# Patient Record
Sex: Female | Born: 1935 | Race: White | Hispanic: No | Smoking: Never smoker
Health system: Southern US, Community
[De-identification: ages and names within clinical notes are randomized; demographics above are authoritative.]

## PROBLEM LIST (undated history)

## (undated) DIAGNOSIS — E119 Type 2 diabetes mellitus without complications: Secondary | ICD-10-CM

## (undated) DIAGNOSIS — K219 Gastro-esophageal reflux disease without esophagitis: Secondary | ICD-10-CM

## (undated) DIAGNOSIS — M51379 Other intervertebral disc degeneration, lumbosacral region without mention of lumbar back pain or lower extremity pain: Secondary | ICD-10-CM

## (undated) DIAGNOSIS — H9190 Unspecified hearing loss, unspecified ear: Secondary | ICD-10-CM

## (undated) DIAGNOSIS — R112 Nausea with vomiting, unspecified: Secondary | ICD-10-CM

## (undated) DIAGNOSIS — Z9889 Other specified postprocedural states: Secondary | ICD-10-CM

## (undated) DIAGNOSIS — I10 Essential (primary) hypertension: Secondary | ICD-10-CM

## (undated) DIAGNOSIS — S52509A Unspecified fracture of the lower end of unspecified radius, initial encounter for closed fracture: Secondary | ICD-10-CM

## (undated) DIAGNOSIS — M1990 Unspecified osteoarthritis, unspecified site: Secondary | ICD-10-CM

## (undated) DIAGNOSIS — M5137 Other intervertebral disc degeneration, lumbosacral region: Secondary | ICD-10-CM

## (undated) DIAGNOSIS — H409 Unspecified glaucoma: Secondary | ICD-10-CM

## (undated) DIAGNOSIS — I509 Heart failure, unspecified: Secondary | ICD-10-CM

## (undated) HISTORY — PX: ABDOMINAL HYSTERECTOMY: SHX81

## (undated) HISTORY — PX: BREAST EXCISIONAL BIOPSY: SUR124

## (undated) HISTORY — PX: BREAST SURGERY: SHX581

## (undated) HISTORY — PX: BREAST BIOPSY: SHX20

## (undated) HISTORY — PX: HIP FRACTURE SURGERY: SHX118

## (undated) HISTORY — PX: FRACTURE SURGERY: SHX138

## (undated) HISTORY — PX: EYE SURGERY: SHX253

---

## 1999-03-29 ENCOUNTER — Ambulatory Visit (HOSPITAL_COMMUNITY): Admission: RE | Admit: 1999-03-29 | Discharge: 1999-03-29 | Payer: Self-pay

## 1999-04-07 ENCOUNTER — Other Ambulatory Visit: Admission: RE | Admit: 1999-04-07 | Discharge: 1999-04-07 | Payer: Self-pay | Admitting: Family Medicine

## 2001-05-07 ENCOUNTER — Encounter: Payer: Self-pay | Admitting: Family Medicine

## 2001-05-07 ENCOUNTER — Ambulatory Visit (HOSPITAL_COMMUNITY): Admission: RE | Admit: 2001-05-07 | Discharge: 2001-05-07 | Payer: Self-pay | Admitting: Family Medicine

## 2001-05-16 ENCOUNTER — Ambulatory Visit (HOSPITAL_COMMUNITY): Admission: RE | Admit: 2001-05-16 | Discharge: 2001-05-16 | Payer: Self-pay | Admitting: Family Medicine

## 2001-05-16 ENCOUNTER — Encounter: Payer: Self-pay | Admitting: Family Medicine

## 2001-10-26 ENCOUNTER — Encounter: Payer: Self-pay | Admitting: Emergency Medicine

## 2001-10-26 ENCOUNTER — Emergency Department (HOSPITAL_COMMUNITY): Admission: EM | Admit: 2001-10-26 | Discharge: 2001-10-26 | Payer: Self-pay | Admitting: Emergency Medicine

## 2002-04-28 ENCOUNTER — Ambulatory Visit (HOSPITAL_COMMUNITY): Admission: RE | Admit: 2002-04-28 | Discharge: 2002-04-28 | Payer: Self-pay | Admitting: Family Medicine

## 2002-04-28 ENCOUNTER — Encounter: Payer: Self-pay | Admitting: Family Medicine

## 2003-04-30 ENCOUNTER — Emergency Department (HOSPITAL_COMMUNITY): Admission: EM | Admit: 2003-04-30 | Discharge: 2003-04-30 | Payer: Self-pay | Admitting: Emergency Medicine

## 2003-05-12 ENCOUNTER — Ambulatory Visit (HOSPITAL_COMMUNITY): Admission: RE | Admit: 2003-05-12 | Discharge: 2003-05-12 | Payer: Self-pay | Admitting: Family Medicine

## 2003-07-27 ENCOUNTER — Ambulatory Visit (HOSPITAL_COMMUNITY): Admission: RE | Admit: 2003-07-27 | Discharge: 2003-07-27 | Payer: Self-pay | Admitting: Orthopaedic Surgery

## 2003-11-13 ENCOUNTER — Ambulatory Visit (HOSPITAL_COMMUNITY): Admission: RE | Admit: 2003-11-13 | Discharge: 2003-11-13 | Payer: Self-pay | Admitting: Family Medicine

## 2004-05-26 ENCOUNTER — Ambulatory Visit (HOSPITAL_COMMUNITY): Admission: RE | Admit: 2004-05-26 | Discharge: 2004-05-26 | Payer: Self-pay | Admitting: Family Medicine

## 2005-06-02 ENCOUNTER — Ambulatory Visit (HOSPITAL_COMMUNITY): Admission: RE | Admit: 2005-06-02 | Discharge: 2005-06-02 | Payer: Self-pay | Admitting: Family Medicine

## 2005-06-24 ENCOUNTER — Emergency Department (HOSPITAL_COMMUNITY): Admission: EM | Admit: 2005-06-24 | Discharge: 2005-06-24 | Payer: Self-pay | Admitting: Emergency Medicine

## 2005-06-29 ENCOUNTER — Emergency Department (HOSPITAL_COMMUNITY): Admission: EM | Admit: 2005-06-29 | Discharge: 2005-06-29 | Payer: Self-pay | Admitting: Emergency Medicine

## 2006-09-03 ENCOUNTER — Ambulatory Visit (HOSPITAL_COMMUNITY): Admission: RE | Admit: 2006-09-03 | Discharge: 2006-09-03 | Payer: Self-pay | Admitting: Family Medicine

## 2007-07-23 ENCOUNTER — Ambulatory Visit (HOSPITAL_COMMUNITY): Admission: RE | Admit: 2007-07-23 | Discharge: 2007-07-23 | Payer: Self-pay | Admitting: Family Medicine

## 2007-09-19 ENCOUNTER — Ambulatory Visit (HOSPITAL_COMMUNITY): Admission: RE | Admit: 2007-09-19 | Discharge: 2007-09-19 | Payer: Self-pay | Admitting: Family Medicine

## 2012-02-16 ENCOUNTER — Other Ambulatory Visit (HOSPITAL_COMMUNITY): Payer: Self-pay | Admitting: Family Medicine

## 2012-02-16 DIAGNOSIS — Z139 Encounter for screening, unspecified: Secondary | ICD-10-CM

## 2012-02-22 ENCOUNTER — Ambulatory Visit (HOSPITAL_COMMUNITY): Payer: Self-pay

## 2012-02-23 ENCOUNTER — Ambulatory Visit (HOSPITAL_COMMUNITY)
Admission: RE | Admit: 2012-02-23 | Discharge: 2012-02-23 | Disposition: A | Discharge status: Home / Self Care | Payer: Medicare Other | Source: Ambulatory Visit | Attending: Family Medicine | Admitting: Family Medicine

## 2012-02-23 DIAGNOSIS — Z139 Encounter for screening, unspecified: Secondary | ICD-10-CM

## 2012-02-23 DIAGNOSIS — Z1231 Encounter for screening mammogram for malignant neoplasm of breast: Secondary | ICD-10-CM | POA: Insufficient documentation

## 2012-02-28 ENCOUNTER — Other Ambulatory Visit: Payer: Self-pay | Admitting: Family Medicine

## 2012-02-28 DIAGNOSIS — R928 Other abnormal and inconclusive findings on diagnostic imaging of breast: Secondary | ICD-10-CM

## 2012-03-13 ENCOUNTER — Ambulatory Visit (HOSPITAL_COMMUNITY)
Admission: RE | Admit: 2012-03-13 | Discharge: 2012-03-13 | Disposition: A | Discharge status: Home / Self Care | Payer: Medicare Other | Source: Ambulatory Visit | Attending: Family Medicine | Admitting: Family Medicine

## 2012-03-13 ENCOUNTER — Other Ambulatory Visit (HOSPITAL_COMMUNITY): Payer: Self-pay | Admitting: Family Medicine

## 2012-03-13 ENCOUNTER — Encounter (HOSPITAL_COMMUNITY): Payer: Medicare Other

## 2012-03-13 DIAGNOSIS — R928 Other abnormal and inconclusive findings on diagnostic imaging of breast: Secondary | ICD-10-CM

## 2014-02-17 ENCOUNTER — Other Ambulatory Visit (HOSPITAL_COMMUNITY): Payer: Self-pay | Admitting: Family Medicine

## 2014-02-17 DIAGNOSIS — Z1231 Encounter for screening mammogram for malignant neoplasm of breast: Secondary | ICD-10-CM

## 2014-02-17 DIAGNOSIS — Z139 Encounter for screening, unspecified: Secondary | ICD-10-CM

## 2014-02-25 ENCOUNTER — Ambulatory Visit (HOSPITAL_COMMUNITY)
Admission: RE | Admit: 2014-02-25 | Discharge: 2014-02-25 | Disposition: A | Discharge status: Home / Self Care | Payer: Medicare Other | Source: Ambulatory Visit | Attending: Family Medicine | Admitting: Family Medicine

## 2014-02-25 DIAGNOSIS — Z1231 Encounter for screening mammogram for malignant neoplasm of breast: Secondary | ICD-10-CM | POA: Insufficient documentation

## 2014-02-25 DIAGNOSIS — Z139 Encounter for screening, unspecified: Secondary | ICD-10-CM

## 2014-07-16 DIAGNOSIS — M810 Age-related osteoporosis without current pathological fracture: Secondary | ICD-10-CM | POA: Insufficient documentation

## 2014-07-16 DIAGNOSIS — M8949 Other hypertrophic osteoarthropathy, multiple sites: Secondary | ICD-10-CM | POA: Insufficient documentation

## 2014-07-16 DIAGNOSIS — M159 Polyosteoarthritis, unspecified: Secondary | ICD-10-CM | POA: Insufficient documentation

## 2015-05-19 DIAGNOSIS — S52539A Colles' fracture of unspecified radius, initial encounter for closed fracture: Secondary | ICD-10-CM | POA: Insufficient documentation

## 2015-05-21 ENCOUNTER — Other Ambulatory Visit: Payer: Self-pay | Admitting: Orthopedic Surgery

## 2015-05-21 ENCOUNTER — Encounter (HOSPITAL_BASED_OUTPATIENT_CLINIC_OR_DEPARTMENT_OTHER): Payer: Self-pay | Admitting: *Deleted

## 2015-05-23 DIAGNOSIS — S52502A Unspecified fracture of the lower end of left radius, initial encounter for closed fracture: Secondary | ICD-10-CM | POA: Diagnosis not present

## 2015-05-23 DIAGNOSIS — I10 Essential (primary) hypertension: Secondary | ICD-10-CM | POA: Diagnosis not present

## 2015-05-23 DIAGNOSIS — E119 Type 2 diabetes mellitus without complications: Secondary | ICD-10-CM | POA: Diagnosis not present

## 2015-05-23 DIAGNOSIS — S52501A Unspecified fracture of the lower end of right radius, initial encounter for closed fracture: Secondary | ICD-10-CM | POA: Diagnosis not present

## 2015-05-24 ENCOUNTER — Ambulatory Visit (HOSPITAL_BASED_OUTPATIENT_CLINIC_OR_DEPARTMENT_OTHER): Payer: Medicare Other | Admitting: Anesthesiology

## 2015-05-24 ENCOUNTER — Encounter (HOSPITAL_BASED_OUTPATIENT_CLINIC_OR_DEPARTMENT_OTHER)
Admission: RE | Disposition: A | Discharge status: Home / Self Care | Payer: Self-pay | Source: Ambulatory Visit | Attending: Orthopedic Surgery

## 2015-05-24 ENCOUNTER — Encounter (HOSPITAL_BASED_OUTPATIENT_CLINIC_OR_DEPARTMENT_OTHER): Payer: Self-pay

## 2015-05-24 ENCOUNTER — Ambulatory Visit (HOSPITAL_BASED_OUTPATIENT_CLINIC_OR_DEPARTMENT_OTHER)
Admission: RE | Admit: 2015-05-24 | Discharge: 2015-05-24 | Disposition: A | Discharge status: Home / Self Care | Payer: Medicare Other | Source: Ambulatory Visit | Attending: Orthopedic Surgery | Admitting: Orthopedic Surgery

## 2015-05-24 DIAGNOSIS — Z90710 Acquired absence of both cervix and uterus: Secondary | ICD-10-CM | POA: Insufficient documentation

## 2015-05-24 DIAGNOSIS — S52501A Unspecified fracture of the lower end of right radius, initial encounter for closed fracture: Secondary | ICD-10-CM | POA: Insufficient documentation

## 2015-05-24 DIAGNOSIS — I10 Essential (primary) hypertension: Secondary | ICD-10-CM | POA: Diagnosis not present

## 2015-05-24 DIAGNOSIS — Z79899 Other long term (current) drug therapy: Secondary | ICD-10-CM | POA: Insufficient documentation

## 2015-05-24 DIAGNOSIS — Z885 Allergy status to narcotic agent status: Secondary | ICD-10-CM | POA: Insufficient documentation

## 2015-05-24 DIAGNOSIS — W19XXXA Unspecified fall, initial encounter: Secondary | ICD-10-CM | POA: Insufficient documentation

## 2015-05-24 DIAGNOSIS — K219 Gastro-esophageal reflux disease without esophagitis: Secondary | ICD-10-CM | POA: Diagnosis not present

## 2015-05-24 DIAGNOSIS — Z7982 Long term (current) use of aspirin: Secondary | ICD-10-CM | POA: Diagnosis not present

## 2015-05-24 DIAGNOSIS — S52502A Unspecified fracture of the lower end of left radius, initial encounter for closed fracture: Secondary | ICD-10-CM | POA: Insufficient documentation

## 2015-05-24 DIAGNOSIS — M1990 Unspecified osteoarthritis, unspecified site: Secondary | ICD-10-CM | POA: Insufficient documentation

## 2015-05-24 DIAGNOSIS — E7800 Pure hypercholesterolemia, unspecified: Secondary | ICD-10-CM | POA: Diagnosis not present

## 2015-05-24 DIAGNOSIS — S52571A Other intraarticular fracture of lower end of right radius, initial encounter for closed fracture: Secondary | ICD-10-CM | POA: Diagnosis present

## 2015-05-24 DIAGNOSIS — Z794 Long term (current) use of insulin: Secondary | ICD-10-CM | POA: Insufficient documentation

## 2015-05-24 DIAGNOSIS — E119 Type 2 diabetes mellitus without complications: Secondary | ICD-10-CM | POA: Insufficient documentation

## 2015-05-24 HISTORY — PX: OPEN REDUCTION INTERNAL FIXATION (ORIF) DISTAL RADIAL FRACTURE: SHX5989

## 2015-05-24 HISTORY — DX: Unspecified fracture of the lower end of unspecified radius, initial encounter for closed fracture: S52.509A

## 2015-05-24 HISTORY — DX: Unspecified hearing loss, unspecified ear: H91.90

## 2015-05-24 HISTORY — DX: Other intervertebral disc degeneration, lumbosacral region without mention of lumbar back pain or lower extremity pain: M51.379

## 2015-05-24 HISTORY — DX: Gastro-esophageal reflux disease without esophagitis: K21.9

## 2015-05-24 HISTORY — DX: Other specified postprocedural states: Z98.890

## 2015-05-24 HISTORY — DX: Other specified postprocedural states: R11.2

## 2015-05-24 HISTORY — DX: Essential (primary) hypertension: I10

## 2015-05-24 HISTORY — DX: Unspecified glaucoma: H40.9

## 2015-05-24 HISTORY — DX: Unspecified osteoarthritis, unspecified site: M19.90

## 2015-05-24 HISTORY — DX: Type 2 diabetes mellitus without complications: E11.9

## 2015-05-24 HISTORY — DX: Other intervertebral disc degeneration, lumbosacral region: M51.37

## 2015-05-24 LAB — POCT I-STAT, CHEM 8
BUN: 19 mg/dL (ref 6–20)
CALCIUM ION: 0.98 mmol/L — AB (ref 1.13–1.30)
CHLORIDE: 102 mmol/L (ref 101–111)
Creatinine, Ser: 0.5 mg/dL (ref 0.44–1.00)
Glucose, Bld: 276 mg/dL — ABNORMAL HIGH (ref 65–99)
HEMATOCRIT: 44 % (ref 36.0–46.0)
Hemoglobin: 15 g/dL (ref 12.0–15.0)
POTASSIUM: 4.4 mmol/L (ref 3.5–5.1)
SODIUM: 135 mmol/L (ref 135–145)
TCO2: 25 mmol/L (ref 0–100)

## 2015-05-24 LAB — GLUCOSE, CAPILLARY
GLUCOSE-CAPILLARY: 196 mg/dL — AB (ref 65–99)
Glucose-Capillary: 228 mg/dL — ABNORMAL HIGH (ref 65–99)

## 2015-05-24 SURGERY — OPEN REDUCTION INTERNAL FIXATION (ORIF) DISTAL RADIUS FRACTURE
Anesthesia: General | Site: Wrist | Laterality: Bilateral

## 2015-05-24 MED ORDER — CEFAZOLIN SODIUM-DEXTROSE 2-3 GM-% IV SOLR
2.0000 g | INTRAVENOUS | Status: AC
  Administered 2015-05-24: 2 g via INTRAVENOUS

## 2015-05-24 MED ORDER — FENTANYL CITRATE (PF) 100 MCG/2ML IJ SOLN
INTRAMUSCULAR | Status: AC
  Filled 2015-05-24: qty 2

## 2015-05-24 MED ORDER — CEFAZOLIN SODIUM-DEXTROSE 2-3 GM-% IV SOLR
INTRAVENOUS | Status: AC
  Filled 2015-05-24: qty 50

## 2015-05-24 MED ORDER — EPHEDRINE SULFATE 50 MG/ML IJ SOLN
INTRAMUSCULAR | Status: DC | PRN
  Administered 2015-05-24: 10 mg via INTRAVENOUS

## 2015-05-24 MED ORDER — BUPIVACAINE HCL (PF) 0.25 % IJ SOLN
INTRAMUSCULAR | Status: DC | PRN
  Administered 2015-05-24: 8 mL
  Administered 2015-05-24: 10 mL

## 2015-05-24 MED ORDER — ACETAMINOPHEN 500 MG PO TABS
ORAL_TABLET | ORAL | Status: AC
  Filled 2015-05-24: qty 2

## 2015-05-24 MED ORDER — SCOPOLAMINE 1 MG/3DAYS TD PT72: 1.0000 | MEDICATED_PATCH | Freq: Once | TRANSDERMAL | Status: DC

## 2015-05-24 MED ORDER — OXYCODONE HCL 5 MG/5ML PO SOLN: 5.0000 mg | Freq: Once | ORAL | Status: DC | PRN

## 2015-05-24 MED ORDER — ONDANSETRON HCL 4 MG/2ML IJ SOLN
INTRAMUSCULAR | Status: AC
  Filled 2015-05-24: qty 2

## 2015-05-24 MED ORDER — PHENYLEPHRINE HCL 10 MG/ML IJ SOLN
INTRAMUSCULAR | Status: DC | PRN
  Administered 2015-05-24 (×2): 80 ug via INTRAVENOUS

## 2015-05-24 MED ORDER — SODIUM CHLORIDE 0.9 % IJ SOLN
INTRAMUSCULAR | Status: AC
  Filled 2015-05-24: qty 10

## 2015-05-24 MED ORDER — PROMETHAZINE HCL 25 MG/ML IJ SOLN
INTRAMUSCULAR | Status: AC
  Filled 2015-05-24: qty 1

## 2015-05-24 MED ORDER — LIDOCAINE HCL (CARDIAC) 20 MG/ML IV SOLN
INTRAVENOUS | Status: DC | PRN
  Administered 2015-05-24: 60 mg via INTRAVENOUS

## 2015-05-24 MED ORDER — HYDROCODONE-ACETAMINOPHEN 7.5-325 MG PO TABS: 1.0000 | ORAL_TABLET | Freq: Once | ORAL | Status: DC | PRN

## 2015-05-24 MED ORDER — PROMETHAZINE HCL 25 MG/ML IJ SOLN
6.2500 mg | Freq: Once | INTRAMUSCULAR | Status: AC
  Administered 2015-05-24: 6.25 mg via INTRAVENOUS

## 2015-05-24 MED ORDER — HYDROMORPHONE HCL 1 MG/ML IJ SOLN
INTRAMUSCULAR | Status: AC
  Filled 2015-05-24: qty 1

## 2015-05-24 MED ORDER — ACETAMINOPHEN 500 MG PO TABS
1000.0000 mg | ORAL_TABLET | Freq: Once | ORAL | Status: AC
  Administered 2015-05-24: 1000 mg via ORAL

## 2015-05-24 MED ORDER — LIDOCAINE HCL (CARDIAC) 20 MG/ML IV SOLN
INTRAVENOUS | Status: AC
  Filled 2015-05-24: qty 5

## 2015-05-24 MED ORDER — FENTANYL CITRATE (PF) 100 MCG/2ML IJ SOLN
25.0000 ug | INTRAMUSCULAR | Status: DC | PRN
  Administered 2015-05-24: 50 ug via INTRAVENOUS
  Administered 2015-05-24: 25 ug via INTRAVENOUS

## 2015-05-24 MED ORDER — ONDANSETRON HCL 4 MG/2ML IJ SOLN
INTRAMUSCULAR | Status: DC | PRN
  Administered 2015-05-24: 4 mg via INTRAVENOUS

## 2015-05-24 MED ORDER — OXYCODONE-ACETAMINOPHEN 5-325 MG PO TABS: 1.0000 | ORAL_TABLET | ORAL | Status: DC | PRN

## 2015-05-24 MED ORDER — BUPIVACAINE HCL (PF) 0.25 % IJ SOLN
INTRAMUSCULAR | Status: AC
  Filled 2015-05-24: qty 120

## 2015-05-24 MED ORDER — PROPOFOL 10 MG/ML IV BOLUS
INTRAVENOUS | Status: DC | PRN
  Administered 2015-05-24: 50 mg via INTRAVENOUS
  Administered 2015-05-24: 150 mg via INTRAVENOUS

## 2015-05-24 MED ORDER — HYDROMORPHONE HCL 1 MG/ML IJ SOLN
0.2500 mg | INTRAMUSCULAR | Status: DC | PRN
  Administered 2015-05-24 (×4): 0.5 mg via INTRAVENOUS

## 2015-05-24 MED ORDER — CHLORHEXIDINE GLUCONATE 4 % EX LIQD: 60.0000 mL | Freq: Once | CUTANEOUS | Status: DC

## 2015-05-24 MED ORDER — OXYCODONE HCL 5 MG PO TABS: 5.0000 mg | ORAL_TABLET | Freq: Once | ORAL | Status: DC | PRN

## 2015-05-24 MED ORDER — CEFAZOLIN SODIUM-DEXTROSE 2-3 GM-% IV SOLR: 2.0000 g | INTRAVENOUS | Status: DC

## 2015-05-24 MED ORDER — ONDANSETRON HCL 4 MG/2ML IJ SOLN
4.0000 mg | Freq: Once | INTRAMUSCULAR | Status: AC | PRN
  Administered 2015-05-24: 4 mg via INTRAVENOUS

## 2015-05-24 MED ORDER — FENTANYL CITRATE (PF) 100 MCG/2ML IJ SOLN
50.0000 ug | INTRAMUSCULAR | Status: AC | PRN
  Administered 2015-05-24 (×6): 50 ug via INTRAVENOUS

## 2015-05-24 MED ORDER — MORPHINE SULFATE (PF) 2 MG/ML IV SOLN
INTRAVENOUS | Status: AC
  Filled 2015-05-24: qty 1

## 2015-05-24 MED ORDER — LACTATED RINGERS IV SOLN
INTRAVENOUS | Status: DC
  Administered 2015-05-24 (×3): via INTRAVENOUS

## 2015-05-24 MED ORDER — DEXAMETHASONE SODIUM PHOSPHATE 10 MG/ML IJ SOLN
INTRAMUSCULAR | Status: DC | PRN
  Administered 2015-05-24: 5 mg via INTRAVENOUS

## 2015-05-24 MED ORDER — GLYCOPYRROLATE 0.2 MG/ML IJ SOLN
0.2000 mg | Freq: Once | INTRAMUSCULAR | Status: AC | PRN
  Administered 2015-05-24: 0.2 mg via INTRAVENOUS

## 2015-05-24 MED ORDER — MIDAZOLAM HCL 2 MG/2ML IJ SOLN: 1.0000 mg | INTRAMUSCULAR | Status: DC | PRN

## 2015-05-24 MED ORDER — CHLORHEXIDINE GLUCONATE 4 % EX LIQD
60.0000 mL | Freq: Once | CUTANEOUS | Status: DC
Start: 2015-05-24 — End: 2015-05-24

## 2015-05-24 MED ORDER — PROPOFOL 500 MG/50ML IV EMUL
INTRAVENOUS | Status: AC
  Filled 2015-05-24: qty 50

## 2015-05-24 MED ORDER — DEXAMETHASONE SODIUM PHOSPHATE 10 MG/ML IJ SOLN
INTRAMUSCULAR | Status: AC
  Filled 2015-05-24: qty 1

## 2015-05-24 MED ORDER — PHENYLEPHRINE 40 MCG/ML (10ML) SYRINGE FOR IV PUSH (FOR BLOOD PRESSURE SUPPORT)
PREFILLED_SYRINGE | INTRAVENOUS | Status: AC
  Filled 2015-05-24: qty 10

## 2015-05-24 MED ORDER — MORPHINE SULFATE 10 MG/ML IJ SOLN
INTRAMUSCULAR | Status: DC | PRN
  Administered 2015-05-24: 2 mg via INTRAVENOUS

## 2015-05-24 SURGICAL SUPPLY — 74 items
BANDAGE ACE 4X5 VEL STRL LF (GAUZE/BANDAGES/DRESSINGS) ×2 IMPLANT
BIT DRILL 2.0 LNG QUCK RELEASE (BIT) IMPLANT
BIT DRILL 2.8X5 QR DISP (BIT) ×2 IMPLANT
BLADE MINI RND TIP GREEN BEAV (BLADE) ×2 IMPLANT
BLADE SURG 15 STRL LF DISP TIS (BLADE) ×1 IMPLANT
BLADE SURG 15 STRL SS (BLADE) ×12
BNDG CMPR 9X4 STRL LF SNTH (GAUZE/BANDAGES/DRESSINGS) ×1
BNDG COHESIVE 3X5 TAN STRL LF (GAUZE/BANDAGES/DRESSINGS) ×5 IMPLANT
BNDG ESMARK 4X9 LF (GAUZE/BANDAGES/DRESSINGS) ×3 IMPLANT
BNDG GAUZE ELAST 4 BULKY (GAUZE/BANDAGES/DRESSINGS) ×5 IMPLANT
BONE CHIP PRESERV 5CC PCAN5 (Bone Implant) ×3 IMPLANT
CHLORAPREP W/TINT 26ML (MISCELLANEOUS) ×5 IMPLANT
CORDS BIPOLAR (ELECTRODE) ×3 IMPLANT
COVER BACK TABLE 60X90IN (DRAPES) ×3 IMPLANT
COVER MAYO STAND STRL (DRAPES) ×3 IMPLANT
CUFF TOURNIQUET SINGLE 18IN (TOURNIQUET CUFF) ×4 IMPLANT
DECANTER SPIKE VIAL GLASS SM (MISCELLANEOUS) IMPLANT
DRAPE EXTREMITY T 121X128X90 (DRAPE) ×5 IMPLANT
DRAPE OEC MINIVIEW 54X84 (DRAPES) ×3 IMPLANT
DRAPE SURG 17X23 STRL (DRAPES) ×5 IMPLANT
DRILL 2.0 LNG QUICK RELEASE (BIT) ×3
GAUZE SPONGE 4X4 12PLY STRL (GAUZE/BANDAGES/DRESSINGS) ×5 IMPLANT
GAUZE XEROFORM 1X8 LF (GAUZE/BANDAGES/DRESSINGS) ×5 IMPLANT
GLOVE BIO SURGEON STRL SZ7.5 (GLOVE) ×2 IMPLANT
GLOVE BIOGEL PI IND STRL 7.0 (GLOVE) IMPLANT
GLOVE BIOGEL PI IND STRL 8 (GLOVE) IMPLANT
GLOVE BIOGEL PI IND STRL 8.5 (GLOVE) ×1 IMPLANT
GLOVE BIOGEL PI INDICATOR 7.0 (GLOVE) ×4
GLOVE BIOGEL PI INDICATOR 8 (GLOVE) ×2
GLOVE BIOGEL PI INDICATOR 8.5 (GLOVE) ×2
GLOVE ECLIPSE 6.5 STRL STRAW (GLOVE) ×2 IMPLANT
GLOVE SURG ORTHO 8.0 STRL STRW (GLOVE) ×3 IMPLANT
GOWN STRL REUS W/ TWL LRG LVL3 (GOWN DISPOSABLE) ×1 IMPLANT
GOWN STRL REUS W/TWL LRG LVL3 (GOWN DISPOSABLE) ×3
GOWN STRL REUS W/TWL XL LVL3 (GOWN DISPOSABLE) ×3 IMPLANT
GRAFT BNE CANC CHIPS 1-8 5CC (Bone Implant) IMPLANT
GUIDEWIRE ORTHO 0.054X6 (WIRE) ×6 IMPLANT
NDL PRECISIONGLIDE 27X1.5 (NEEDLE) IMPLANT
NEEDLE PRECISIONGLIDE 27X1.5 (NEEDLE) ×3 IMPLANT
NS IRRIG 1000ML POUR BTL (IV SOLUTION) ×3 IMPLANT
PACK BASIN DAY SURGERY FS (CUSTOM PROCEDURE TRAY) ×3 IMPLANT
PAD CAST 3X4 CTTN HI CHSV (CAST SUPPLIES) ×1 IMPLANT
PADDING CAST COTTON 3X4 STRL (CAST SUPPLIES) ×6
PLATE ACULOCK 2 NARROW RT (Plate) ×2 IMPLANT
PLATE LEFT DIST RADIUS NARROW (Plate) ×2 IMPLANT
SCREW ACTK 2 NL HEX 3.5.11 (Screw) ×2 IMPLANT
SCREW BN FT 16X2.3XLCK HEX CRT (Screw) IMPLANT
SCREW CORT FT 18X2.3XLCK HEX (Screw) IMPLANT
SCREW CORT FT 20X2.3XLCK HEX (Screw) IMPLANT
SCREW CORTICAL LOCKING 2.3X16M (Screw) ×12 IMPLANT
SCREW CORTICAL LOCKING 2.3X18M (Screw) ×18 IMPLANT
SCREW CORTICAL LOCKING 2.3X20M (Screw) ×3 IMPLANT
SCREW FX16X2.3XLCK SMTH NS CRT (Screw) IMPLANT
SCREW FX18X2.3XSMTH LCK NS CRT (Screw) IMPLANT
SCREW NLCKG 13 3.5X13 HEXA (Screw) IMPLANT
SCREW NON LOCK 3.5X10MM (Screw) ×4 IMPLANT
SCREW NON TOGG 2.3X22MM (Screw) ×2 IMPLANT
SCREW NON-LOCK 3.5X13 (Screw) ×3 IMPLANT
SCREW NONLOCK HEX 3.5X12 (Screw) ×4 IMPLANT
SLEEVE SCD COMPRESS KNEE MED (MISCELLANEOUS) ×3 IMPLANT
SPLINT PLASTER CAST XFAST 3X15 (CAST SUPPLIES) IMPLANT
SPLINT PLASTER XTRA FASTSET 3X (CAST SUPPLIES) ×40
STOCKINETTE 4X48 STRL (DRAPES) ×5 IMPLANT
SUCTION FRAZIER HANDLE 10FR (MISCELLANEOUS)
SUCTION TUBE FRAZIER 10FR DISP (MISCELLANEOUS) IMPLANT
SUT ETHILON 4 0 PS 2 18 (SUTURE) ×5 IMPLANT
SUT VICRYL 4-0 PS2 18IN ABS (SUTURE) ×5 IMPLANT
SYR BULB 3OZ (MISCELLANEOUS) ×3 IMPLANT
SYR CONTROL 10ML LL (SYRINGE) ×2 IMPLANT
TOWEL OR 17X24 6PK STRL BLUE (TOWEL DISPOSABLE) ×5 IMPLANT
TOWEL OR NON WOVEN STRL DISP B (DISPOSABLE) ×2 IMPLANT
TUBE CONNECTING 20'X1/4 (TUBING)
TUBE CONNECTING 20X1/4 (TUBING) IMPLANT
UNDERPAD 30X30 (UNDERPADS AND DIAPERS) ×1 IMPLANT

## 2015-05-24 NOTE — Op Note (Signed)
[name], [name]                 ACCOUNT NO.:  [id number]  MEDICAL RECORD NO.:  [id number]  LOCATION:                                 FACILITY:  PHYSICIAN:  Daryll Brod, M.D.            DATE OF BIRTH:  DATE OF PROCEDURE:  05/24/2015 DATE OF DISCHARGE:                              OPERATIVE REPORT   PREOPERATIVE DIAGNOSIS:  Bilateral distal radius fractures.  POSTOPERATIVE DIAGNOSES:  Bilateral distal radius fractures.  OPERATION: 1. Open reduction and internal fixation, right distal radius. 2. Open reduction and internal fixation, left distal radius, both     intra-articular multiple fragment.  SURGEON:  Daryll Brod, M.D.  ASSISTANT:  Leanora Cover, M.D.  ANESTHESIA:  General with local infiltration.  DATE OF OPERATION:  May 24, 2015.  HISTORY:  The patient is a 80 year old female, who suffered a fall, suffering bilateral distal radius intra-articular fracture.  The left- sided markedly comminuted the right side, comminuted with multiple fragments, both with internal desruption to the articular surface.  She is admitted for open reduction and internal fixation of both sides with possible bridge plate on the left side.  Pre, peri, and postoperative course have been discussed along with risks and complications.  She is aware that there is no guarantee with the surgery, possibility of infection; recurrence of injury to arteries, nerves, tendons; incomplete relief of symptoms; dystrophy.  In the preoperative area, the patient is seen, the extremity marked by both patient and surgeon.  Antibiotic given.  PROCEDURE IN DETAIL:  The patient was brought to the operating room, where general anesthetic was carried out without difficulty.  She was prepped using ChloraPrep, supine position, right arm free.  A 3-minute dry time was allowed.  Time-out taken, confirming the patient and procedure.  The limb was exsanguinated with an Esmarch bandage. Tourniquet placed high and the arm  was inflated to 250 mmHg.  A radial volar incision was made for placement of a distal volar plate. Dissection was carried down to the flexor carpi radialis tendon.  This was incised.  The dissection carried through the dorsal portion of the sheath for the flexor carpi radialis, identifying the flexor pollicis longus, and the pronator quadratus.  This was incised on its radial aspect, through pronator quadratus.  The brachioradialis was then isolated and released radially.  The pronator quadratus was elevated off from the distal radius, allowing visualization of the fracture.  This was manipulated.  A distal volar AccuMed DVR plate was then selected. This was placed, pinned in position, with guide pins.  X-rays taken confirming the position on the distal radius.  Found to be adequate with reduction of the fracture.  The proximal the gliding screw was then placed, this measured 12 mm.  This firmly fixed the plate along with the distal K-wires, fixing the plate distally.  The distal pegs were then placed.  These were each drilled, measured, and vary between l6 and 18 mm.  The radial 2 screws were placed into the styloid, fixing the styloid in position; AP and lateral.  Oblique x-rays revealed the fracture reduced with good fixation of the plate.  No  intra-articular penetration.  The wrist was placed through a full range motion, no further problems with flexion, extension, pronation, supination were noted.  The fracture was stable.  The remaining proximal screws were placed.  These measured 11 and 13 mm.  These firmly fixed the fracture in position.  Repeat x-rays; AP, lateral, and oblique, revealed no penetration of the articular surface, with the plate in good position and coaptated to the bone.  The wound was copiously irrigated with saline.  The pronator quadratus was repaired with figure-of-eight 4-0 Vicryl sutures and subcutaneous tissue with interrupted 4-0 Vicryl, and the skin with  interrupted 4-0 nylon sutures.  A local infiltrate infiltration of 0.25% bupivacaine without epinephrine was given, approximately 9 mL was used.  Sterile compressive dressing was applied. The tourniquet deflated.  All fingers immediately pinked.     The left sidewas prepped and draped in a similar manner.  Time-out again taken.  The limb was exsanguinated with an Esmarch bandage.  Tourniquet placed high on the arm was inflated to 250 mmHg.  Again, a volar incision was made. The left distal radius carried down through subcutaneous tissue through the flexor carpi radialis tendon sheath.  The pronator quadratus was found to be markedly damaged.  This was incised.  The brachioradialis was then released from the styloid the fracture was markedly comminuted with a large volar fragment from the volar cortex at the metaphyseal- diaphyseal junction.  This was reduced.  Bone allograft chips  was then placed and packed stabilizing the articular surface.  Again, a distal of volar plate from Accu Med was then selected.  This was placed, pinned in position, re- adjusted under image intensification until this lied in good position with good coaptation of the fracture fragments.  Reconstruction of the articular surface.  The distal pegs were then placed, after fixing the plate with a 10 mm screw proximally through the gliding hole.  The distal pegs were between 16 and 18 mm.  The radial styloid was fixed with screws.  One central non locking screw was placed to coapt the dorsal fracture fragment.  This was an 18 mm nonlocking screws.  This firmly fixed the articular surface and positioned on AP and lateral x-rays and oblique, confirming no intra-articular penetration.  The remaining proximal screws were placed.  These measured 10 mm and 12 mm.  The x-rays confirmed positioning.  Pronation, supination, flexion, and extension of the wrist was maintained.  The wound was copiously irrigated with saline.  The  pronator quadratus was repaired as much as possible with 4- 0 Vicryl sutures.  The subcutaneous tissue with interrupted 4-0 Vicryl and skin with interrupted 4-0 nylon.  A sterile compressive dressing, volar dorsal splint was then fixed to each extremity.  Deflation of the tourniquet on the left side.  The fingers immediately pinked and she was taken to the recovery room for observation in satisfactory condition. She will be discharged home to return to the [place] [facility] in 1 week, on Percocet.          ______________________________ Daryll Brod, M.D.     GK/MEDQ  D:  05/24/2015  T:  05/24/2015  Job:  [id number]

## 2015-05-24 NOTE — Brief Op Note (Signed)
05/24/2015  11:08 AM  PATIENT:  [name]  80 y.o. female  PRE-OPERATIVE DIAGNOSIS:  Fracture distal Radius Right Fracture Distal Radius Left  POST-OPERATIVE DIAGNOSIS:  Fracture distal Radius Right Fracture Distal Radius Left  PROCEDURE:  Procedure(s): OPEN REDUCTION INTERNAL FIXATION (ORIF) BILATERAL DISTAL RADIAL FRACTURE VOLAR PLATE (Bilateral)  SURGEON:  Surgeon(s) and Role:    * Daryll Brod, MD - Primary    * Leanora Cover, MD - Assisting  PHYSICIAN ASSISTANT:   ASSISTANTS: Richardo Priest, MD   ANESTHESIA:   local and general  EBL:  Total I/O In: 1300 [I.V.:1300] Out: -   BLOOD ADMINISTERED:none  DRAINS: none and Gastrostomy Tube   LOCAL MEDICATIONS USED:  BUPIVICAINE   SPECIMEN:  No Specimen  DISPOSITION OF SPECIMEN:  N/A  COUNTS:  YES  TOURNIQUET:   Total Tourniquet Time Documented: Upper Arm (Right) - 44 minutes Total: Upper Arm (Right) - 44 minutes  Upper Arm (Left) - 52 minutes Total: Upper Arm (Left) - 52 minutes   DICTATION: .Other Dictation: Dictation Number [id number]  PLAN OF CARE: Discharge to home after PACU  PATIENT DISPOSITION:  PACU - hemodynamically stable.

## 2015-05-24 NOTE — Op Note (Signed)
[id number] Intra-operative fluoroscopic images in the AP, lateral, and oblique views were taken and evaluated by myself.  Reduction and hardware placement were confirmed.  There was no intraarticular penetration of permanent hardware.

## 2015-05-24 NOTE — Discharge Instructions (Addendum)
[facility] Instructions °Hand Surgery ° °Wound Care: °Keep your hand elevated above the level of your heart.  Do not allow it to dangle by your side.  Keep the dressing dry and do not remove it unless your doctor advises you to do so.  He will usually change it at the time of your post-op visit.  Moving your fingers is advised to stimulate circulation but will depend on the site of your surgery.  If you have a splint applied, your doctor will advise you regarding movement. ° °Activity: °Do not drive or operate machinery today.  Rest today and then you may return to your normal activity and work as indicated by your physician. ° °Diet:  °Drink liquids today or eat a light diet.  You may resume a regular diet tomorrow.   ° °General expectations: °Pain for two to three days. °Fingers may become slightly swollen. ° °Call your doctor if any of the following occur: °Severe pain not relieved by pain medication. °Elevated temperature. °Dressing soaked with blood. °Inability to move fingers. °White or bluish color to fingers. ° ° °Post Anesthesia Home Care Instructions ° °Activity: °Get plenty of rest for the remainder of the day. A responsible adult should stay with you for 24 hours following the procedure.  °For the next 24 hours, DO NOT: °-Drive a car °-Operate machinery °-Drink alcoholic beverages °-Take any medication unless instructed by your physician °-Make any legal decisions or sign important papers. ° °Meals: °Start with liquid foods such as gelatin or soup. Progress to regular foods as tolerated. Avoid greasy, spicy, heavy foods. If nausea and/or vomiting occur, drink only clear liquids until the nausea and/or vomiting subsides. Call your physician if vomiting continues. ° °Special Instructions/Symptoms: °Your throat may feel dry or sore from the anesthesia or the breathing tube placed in your throat during surgery. If this causes discomfort, gargle with warm salt water. The discomfort should disappear within 24  hours. ° °If you had a scopolamine patch placed behind your ear for the management of post- operative nausea and/or vomiting: ° °1. The medication in the patch is effective for 72 hours, after which it should be removed.  Wrap patch in a tissue and discard in the trash. Wash hands thoroughly with soap and water. °2. You may remove the patch earlier than 72 hours if you experience unpleasant side effects which may include dry mouth, dizziness or visual disturbances. °3. Avoid touching the patch. Wash your hands with soap and water after contact with the patch. °  ° ° °Post Anesthesia Home Care Instructions ° °Activity: °Get plenty of rest for the remainder of the day. A responsible adult should stay with you for 24 hours following the procedure.  °For the next 24 hours, DO NOT: °-Drive a car °-Operate machinery °-Drink alcoholic beverages °-Take any medication unless instructed by your physician °-Make any legal decisions or sign important papers. ° °Meals: °Start with liquid foods such as gelatin or soup. Progress to regular foods as tolerated. Avoid greasy, spicy, heavy foods. If nausea and/or vomiting occur, drink only clear liquids until the nausea and/or vomiting subsides. Call your physician if vomiting continues. ° °Special Instructions/Symptoms: °Your throat may feel dry or sore from the anesthesia or the breathing tube placed in your throat during surgery. If this causes discomfort, gargle with warm salt water. The discomfort should disappear within 24 hours. ° °If you had a scopolamine patch placed behind your ear for the management of post- operative nausea and/or vomiting: ° °  1. The medication in the patch is effective for 72 hours, after which it should be removed.  Wrap patch in a tissue and discard in the trash. Wash hands thoroughly with soap and water. °2. You may remove the patch earlier than 72 hours if you experience unpleasant side effects which may include dry mouth, dizziness or visual  disturbances. °3. Avoid touching the patch. Wash your hands with soap and water after contact with the patch. °  ° °

## 2015-05-24 NOTE — Progress Notes (Signed)
Received report from [place], [place] to assume care of patient in Phase 2.

## 2015-05-24 NOTE — Anesthesia Preprocedure Evaluation (Addendum)
Anesthesia Evaluation  Patient identified by MRN, date of birth, ID band Patient awake    Reviewed: Allergy & Precautions, NPO status , Patient's Chart, lab work & pertinent test results  History of Anesthesia Complications (+) PONV and history of anesthetic complications  Airway Mallampati: II  TM Distance: >3 FB Neck ROM: Full    Dental no notable dental hx.    Pulmonary    Pulmonary exam normal breath sounds clear to auscultation       Cardiovascular hypertension,  Rhythm:Regular Rate:Normal     Neuro/Psych    GI/Hepatic GERD  ,  Endo/Other  diabetes, Well Controlled, Insulin Dependent  Renal/GU      Musculoskeletal  (+) Arthritis ,   Abdominal   Peds  Hematology   Anesthesia Other Findings Arms not options for IV given bilateral procedure nature, no targets on lower extremities, pt with insulin dependent diabetes, will induce with IV in [facility] and then get neck IV with ultrasound, no EJ target either  Reproductive/Obstetrics                           Anesthesia Physical Anesthesia Plan  ASA: II  Anesthesia Plan: General   Post-op Pain Management:    Induction: Intravenous  Airway Management Planned: LMA  Additional Equipment:   Intra-op Plan:   Post-operative Plan: Extubation in OR  Informed Consent: I have reviewed the patients History and Physical, chart, labs and discussed the procedure including the risks, benefits and alternatives for the proposed anesthesia with the patient or authorized representative who has indicated his/her understanding and acceptance.   Dental advisory given  Plan Discussed with: Anesthesiologist and CRNA  Anesthesia Plan Comments: (Bilateral radial fracture, multimodal analgesia with tylenol + opioids, local at site Will avoid bilateral block for increased complication risk and immobilization of bilateral upper limbs)       Anesthesia Quick  Evaluation

## 2015-05-24 NOTE — Anesthesia Postprocedure Evaluation (Signed)
Anesthesia Post Note  Patient: [name]  Procedure(s) Performed: Procedure(s) (LRB): OPEN REDUCTION INTERNAL FIXATION (ORIF) BILATERAL DISTAL RADIAL FRACTURE VOLAR PLATE (Bilateral)  Patient location during evaluation: PACU Anesthesia Type: General Level of consciousness: awake and alert Pain management: pain level controlled Vital Signs Assessment: post-procedure vital signs reviewed and stable Respiratory status: spontaneous breathing, nonlabored ventilation and respiratory function stable Cardiovascular status: blood pressure returned to baseline and stable Postop Assessment: no signs of nausea or vomiting Anesthetic complications: no Comments: Doing well, no signs of neck swelling or hematoma formation around neck IV, removed and pressure held for 10 minutes    Last Vitals:  Filed Vitals:   05/24/15 1215 05/24/15 1230  BP: 152/50 159/89  Pulse: 69 83  Temp:    Resp: 18 17    Last Pain:  Filed Vitals:   05/24/15 1237  PainSc: 8                  Zenaida Deed

## 2015-05-24 NOTE — Op Note (Signed)
I assisted Surgeon(s) and Role:    * Daryll Brod, MD - Primary    * Leanora Cover, MD - Assisting on the Procedure(s): OPEN REDUCTION INTERNAL FIXATION (ORIF) BILATERAL DISTAL RADIAL FRACTURE VOLAR PLATE on [id number].  I provided assistance on this case as follows: aided in retraction of soft tissues, reduction of fracture, and placement of hardware.  Electronically signed by: Tennis Must, MD Date: 05/24/2015 Time: 1:40 PM

## 2015-05-24 NOTE — H&P (Signed)
KATY GROSSO is an 80 y.o. female.   Chief Complaint: pain both wrists HPI: Ms. Rhead is a 80 year old, right-hand dominant female referred by Dr. Alphonzo Cruise for consultation following a fall. The fall occurred on 05/15/2015. She has no prior history of injury. She was seen and placed in splints after x-rays revealed a very-comminuted fracture of her left distal radius. She has an irregularity of her right distal radius with pain; I suspect it has a fair actual fracture of her right distal radius, also. She was placed in a splint on her left side. She was placed in an Ace wrap on her right. She complains of pain on both sides. She is allergic to CODEINE. She states she fractured her nose. She has a history of diabetes. No history of thyroid problems,and a history of arthritis. There is family history of diabetes, thyroid problems and arthritis. Again, she has no prior history of injuries.Dr Melinda Crutch' notes are reviewed. Xrays from [facility] hospital are reviewed. CTscans reveal bilateral intra-articular distal radius fractures.  Past Medical History  Diagnosis Date  . Arthritis  . Diabetes mellitus type II, controlled ([place])  . GERD (gastroesophageal reflux disease)  . High cholesterol  . Hypertension   Past Surgical History  Procedure Laterality Date  . Hysterectomy  . Hip surgery    Past Medical History  Diagnosis Date  . PONV (postoperative nausea and vomiting)   . Distal radius fracture     bilateral  . Hypertension   . Diabetes mellitus without complication ([place])   . Arthritis     OA  . Glaucoma   . HOH (hard of hearing)     left ear  . DDD (degenerative disc disease), lumbosacral   . GERD (gastroesophageal reflux disease)     Past Surgical History  Procedure Laterality Date  . Abdominal hysterectomy    . Eye surgery      cataract  . Fracture surgery Left     hip fracture  . Breast surgery      left milk duct     History reviewed. No pertinent family  history. Social History:  reports that she has never smoked. She does not have any smokeless tobacco history on file. She reports that she does not drink alcohol or use illicit drugs.  Allergies:  Allergies  Allergen Reactions  . Codeine Nausea Only    Medications Prior to Admission  Medication Sig Dispense Refill  . amLODipine (NORVASC) 10 MG tablet Take 10 mg by mouth daily.    Marland Kitchen aspirin 81 MG tablet Take 81 mg by mouth daily.    . calcium-vitamin D (OSCAL WITH D) 500-200 MG-UNIT tablet Take 1 tablet by mouth.    . fluticasone (FLONASE) 50 MCG/ACT nasal spray Place into both nostrils daily.    Marland Kitchen glipiZIDE (GLUCOTROL) 10 MG tablet Take 10 mg by mouth daily before breakfast.    . ibandronate (BONIVA) 150 MG tablet Take 150 mg by mouth every 30 (thirty) days. Take in the morning with a full glass of water, on an empty stomach, and do not take anything else by mouth or lie down for the next 30 min.    . insulin glargine (LANTUS) 100 UNIT/ML injection Inject 20 Units into the skin at bedtime.    Marland Kitchen losartan-hydrochlorothiazide (HYZAAR) 100-12.5 MG tablet Take 1 tablet by mouth daily.    . metFORMIN (GLUCOPHAGE) 500 MG tablet Take 500 mg by mouth 2 (two) times daily with a meal.    . metoprolol (LOPRESSOR)  50 MG tablet Take 50 mg by mouth 2 (two) times daily.    . multivitamin-iron-minerals-folic acid (CENTRUM) chewable tablet Chew 1 tablet by mouth daily.    Earney Navy Bicarbonate (ZEGERID) 20-1100 MG CAPS capsule Take 1 capsule by mouth daily before breakfast.    . traMADol (ULTRAM) 50 MG tablet Take by mouth every 6 (six) hours as needed.      No results found for this or any previous visit (from the past 48 hour(s)).  No results found.   Pertinent items are noted in HPI.  Blood pressure 164/62, pulse 92, temperature 98.5 F (36.9 C), temperature source Oral, resp. rate 17, height 5\' 4"  (1.626 m), weight 65.942 kg (145 lb 6 oz), SpO2 96 %.  General appearance: alert,  cooperative and appears stated age Head: Normocephalic, without obvious abnormality Neck: no JVD Resp: clear to auscultation bilaterally Cardio: regular rate and rhythm, S1, S2 normal, no murmur, click, rub or gallop GI: soft, non-tender; bowel sounds normal; no masses,  no organomegaly Extremities: pain bilateral wrists Pulses: 2+ and symmetric Skin: Skin color, texture, turgor normal. No rashes or lesions Neurologic: Grossly normal Incision/Wound: na  Assessment/Plan Closed Colles' fracture of left radius Closed Colles' fracture of right radius  Plan: ORIF bilateral distal radius fractures. Pre, peri and postoperative course were discussed along with the risks and complications.  The patient is aware there is no guarantee with the surgery, possibility of infection, recurrence, injury to arteries, nerves, tendons, incomplete relief of symptoms and dystrophy.    Eknoor Novack R 05/24/2015, 7:44 AM

## 2015-05-24 NOTE — Transfer of Care (Signed)
Immediate Anesthesia Transfer of Care Note  Patient: [name]  Procedure(s) Performed: Procedure(s): OPEN REDUCTION INTERNAL FIXATION (ORIF) BILATERAL DISTAL RADIAL FRACTURE VOLAR PLATE (Bilateral)  Patient Location: PACU  Anesthesia Type:General  Level of Consciousness: sedated  Airway & Oxygen Therapy: Patient Spontanous Breathing and Patient connected to face mask oxygen  Post-op Assessment: Report given to RN and Post -op Vital signs reviewed and stable  Post vital signs: Reviewed and stable  Last Vitals:  Filed Vitals:   05/24/15 0706  BP: 164/62  Pulse: 92  Temp: 36.9 C  Resp: 17    Complications: No apparent anesthesia complications

## 2015-05-24 NOTE — Anesthesia Procedure Notes (Signed)
Procedure Name: LMA Insertion Date/Time: 05/24/2015 8:45 AM Performed by: Maryella Shivers Pre-anesthesia Checklist: Patient identified, Emergency Drugs available, Suction available and Patient being monitored Patient Re-evaluated:Patient Re-evaluated prior to inductionOxygen Delivery Method: Circle System Utilized Preoxygenation: Pre-oxygenation with 100% oxygen Intubation Type: IV induction Ventilation: Mask ventilation without difficulty LMA: LMA inserted LMA Size: 4.0 Number of attempts: 1 Airway Equipment and Method: Bite block Placement Confirmation: positive ETCO2 Tube secured with: Tape Dental Injury: Teeth and Oropharynx as per pre-operative assessment

## 2015-05-25 ENCOUNTER — Encounter (HOSPITAL_BASED_OUTPATIENT_CLINIC_OR_DEPARTMENT_OTHER): Payer: Self-pay | Admitting: Orthopedic Surgery

## 2015-06-02 NOTE — Op Note (Signed)
Post operative diagnosis: Right distal radius fracture, intraarticular with 4 fragments                                           Left distal radius fracture , intra articular with 6 plus fragments  Operation: ORIF: Right distal radius fracture, intraarticular with 4 fragments                   ORIF:  Left distal radius fracture , intra articular with 6 plus fragments

## 2015-06-02 NOTE — Op Note (Signed)
Addendum;  Right hand 4 fragments distal radius fracture Left hand 6 fragments  Distal radius fracture

## 2015-08-08 DIAGNOSIS — M25531 Pain in right wrist: Secondary | ICD-10-CM | POA: Insufficient documentation

## 2015-09-07 DIAGNOSIS — I2583 Coronary atherosclerosis due to lipid rich plaque: Secondary | ICD-10-CM | POA: Insufficient documentation

## 2015-09-07 DIAGNOSIS — I2510 Atherosclerotic heart disease of native coronary artery without angina pectoris: Secondary | ICD-10-CM | POA: Insufficient documentation

## 2016-01-12 ENCOUNTER — Other Ambulatory Visit (HOSPITAL_COMMUNITY): Payer: Self-pay | Admitting: Family Medicine

## 2016-01-12 DIAGNOSIS — Z1231 Encounter for screening mammogram for malignant neoplasm of breast: Secondary | ICD-10-CM

## 2016-01-19 ENCOUNTER — Ambulatory Visit (HOSPITAL_COMMUNITY)
Admission: RE | Admit: 2016-01-19 | Discharge: 2016-01-19 | Disposition: A | Discharge status: Home / Self Care | Payer: Medicare Other | Source: Ambulatory Visit | Attending: Family Medicine | Admitting: Family Medicine

## 2016-01-19 DIAGNOSIS — Z1231 Encounter for screening mammogram for malignant neoplasm of breast: Secondary | ICD-10-CM

## 2016-03-15 ENCOUNTER — Ambulatory Visit: Payer: Medicare Other | Attending: Adult Health Nurse Practitioner | Admitting: Physical Therapy

## 2016-03-15 DIAGNOSIS — R2689 Other abnormalities of gait and mobility: Secondary | ICD-10-CM | POA: Diagnosis not present

## 2016-03-15 DIAGNOSIS — R260 Ataxic gait: Secondary | ICD-10-CM | POA: Diagnosis present

## 2016-03-15 DIAGNOSIS — M6281 Muscle weakness (generalized): Secondary | ICD-10-CM | POA: Diagnosis present

## 2016-03-15 NOTE — Therapy (Signed)
[facility] [place], [place], [address] Phone: [phone]   Fax:  [phone]  Physical Therapy Evaluation  Patient Details  Name: [name] MRN: [id number] Date of Birth: 1936/01/15 Referring Provider: Lars Mage NP  Encounter Date: 03/15/2016      PT End of Session - 03/15/16 0942    Visit Number 1   Number of Visits 16   Date for PT Re-Evaluation 05/14/16   PT Start Time 0900   PT Stop Time 0943   PT Time Calculation (min) 43 min   Activity Tolerance Patient tolerated treatment well   Behavior During Therapy [facility] for tasks assessed/performed      Past Medical History:  Diagnosis Date  . Arthritis    OA  . DDD (degenerative disc disease), lumbosacral   . Diabetes mellitus without complication ([place])   . Distal radius fracture    bilateral  . GERD (gastroesophageal reflux disease)   . Glaucoma   . HOH (hard of hearing)    left ear  . Hypertension   . PONV (postoperative nausea and vomiting)     Past Surgical History:  Procedure Laterality Date  . ABDOMINAL HYSTERECTOMY    . BREAST SURGERY     left milk duct   . EYE SURGERY     cataract  . FRACTURE SURGERY Left    hip fracture  . OPEN REDUCTION INTERNAL FIXATION (ORIF) DISTAL RADIAL FRACTURE Bilateral 05/24/2015   Procedure: OPEN REDUCTION INTERNAL FIXATION (ORIF) BILATERAL DISTAL RADIAL FRACTURE VOLAR PLATE;  Surgeon: Daryll Brod, MD;  Location: [facility];  Service: Orthopedics;  Laterality: Bilateral;    There were no vitals filed for this visit.       Subjective Assessment - 03/15/16 0929    Subjective The patient reports that she began to fall approximately 2 years ago.  On one occasion she fell and broke her right hip and on another occasion she fell and broke both wrists.  She reports both falls were due to slipping.  The patient has 2 canes at home and she was encouraged them to use them at all times.     Patient  Stated Goals Not fall.   Currently in Pain? No/denies            [facility] PT Assessment - 03/15/16 0001      Assessment   Medical Diagnosis Abnormality of giat.   Referring Provider Lars Mage NP   Onset Date/Surgical Date --  2 years.   Hand Dominance Right     Precautions   Precautions Fall     Restrictions   Weight Bearing Restrictions No     Balance Screen   Has the patient fallen in the past 6 months No   Has the patient had a decrease in activity level because of a fear of falling?  No   Is the patient reluctant to leave their home because of a fear of falling?  No     Home Ec[employer] residence     Prior Function   Level of Independence Independent     Posture/Postural Control   Posture/Postural Control Postural limitations   Postural Limitations Decreased thoracic kyphosis     ROM / Strength   AROM / PROM / Strength AROM;Strength     AROM   Overall AROM Comments WNL bilateral LE ROM.     Strength   Overall Strength Comments RT hip flexion and abd= 4/5; left ankle  dorsiflexion= 4/5.     Special Tests    Special Tests --  (-) but "wobbly" Romberg test.     Ambulation/Gait   Gait Pattern Decreased arm swing - right;Decreased step length - right;Decreased step length - left;Decreased stride length;Trunk flexed     Standardized Balance Assessment   Standardized Balance Assessment Berg Balance Test     Berg Balance Test   Sit to Stand Able to stand  independently using hands   Standing Unsupported Able to stand 2 minutes with supervision   Sitting with Back Unsupported but Feet Supported on Floor or Stool Able to sit safely and securely 2 minutes   Stand to Sit Sits safely with minimal use of hands   Transfers Able to transfer safely, minor use of hands   Standing Unsupported with Eyes Closed Able to stand 10 seconds with supervision   Standing Ubsupported with Feet Together Able to place feet together independently  but unable to hold for 30 seconds   From Standing, Reach Forward with Outstretched Arm Can reach confidently >25 cm (10")   From Standing Position, Pick up Object from Floor Able to pick up shoe, needs supervision   From Standing Position, Turn to Look Behind Over each Shoulder Looks behind one side only/other side shows less weight shift   Turn 360 Degrees Able to turn 360 degrees safely but slowly   Standing Unsupported, Alternately Place Feet on Step/Stool Able to complete 4 steps without aid or supervision   Standing Unsupported, One Foot in Front Able to take small step independently and hold 30 seconds   Standing on One Leg Tries to lift leg/unable to hold 3 seconds but remains standing independently   Total Score 40                   [facility] Adult PT Treatment/Exercise - 03/15/16 0001      Exercises   Exercises Knee/Hip     Knee/Hip Exercises: Aerobic   Nustep Level 3 x 10 minutes.                  PT Short Term Goals - 03/15/16 0938      PT SHORT TERM GOAL #1   Title Berg score improved to 44/56.   Time 4   Period Weeks   Status New           PT Long Term Goals - 03/15/16 [id number]      PT LONG TERM GOAL #1   Title Independent with a HEP.   Time 8   Period Weeks   Status New     PT LONG TERM GOAL #2   Title Improve Berg score to 50/56.   Time 8   Period Weeks   Status New     PT LONG TERM GOAL #3   Title Right hip and left ankle strength to a solid 4+/5 to increase stabilit for functional activites.   Time 8   Period Weeks   Status New     PT LONG TERM GOAL #4   Title Walk in clinic 500 with a cane without loss of balance.   Time 8   Period Weeks   Status New               Plan - 03/15/16 0934    Clinical Impression Statement The patient scored a 40/56 on her Merrilee Jansky test.  The patient should be using a can at all times for safety.  She has not been  compliant to this.  She also has weakness in her right hip and left ankle.    Rehab Potential Excellent   PT Frequency 2x / week   PT Duration 8 weeks   PT Treatment/Interventions ADLs/Self Care Home Management;Gait training;Functional mobility training;Therapeutic activities;Therapeutic exercise;Balance training;Neuromuscular re-education;Patient/family education   PT Next Visit Plan Balance and gait training.  LE strengthning and core exercises.      Patient will benefit from skilled therapeutic intervention in order to improve the following deficits and impairments:  Decreased activity tolerance, Decreased strength, Decreased balance, Abnormal gait  Visit Diagnosis: Other abnormalities of gait and mobility - Plan: PT plan of care cert/re-cert  Ataxic gait - Plan: PT plan of care cert/re-cert  Muscle weakness (generalized) - Plan: PT plan of care cert/re-cert      G-Codes - [id number]    Functional Assessment Tool Used Clinical judgement....   Functional Limitation Mobility: Walking and moving around   Mobility: Walking and Moving Around Current Status [id number]) At least 40 percent but less than 60 percent impaired, limited or restricted   Mobility: Walking and Moving Around Goal Status [phone]) At least 20 percent but less than 40 percent impaired, limited or restricted       Problem List There are no active problems to display for this patient.   Melany Wiesman, [place] MPT 03/15/2016, 9:55 AM  [facility] [address] [place], [place], [address] Phone: [phone]   Fax:  [phone]  Name: [name] MRN: [id number] Date of Birth: [birth year]

## 2016-03-21 ENCOUNTER — Ambulatory Visit: Payer: Medicare Other | Admitting: Physical Therapy

## 2016-03-24 ENCOUNTER — Ambulatory Visit: Payer: Medicare Other | Admitting: Physical Therapy

## 2016-03-24 ENCOUNTER — Encounter: Payer: Self-pay | Admitting: Physical Therapy

## 2016-03-24 DIAGNOSIS — R2689 Other abnormalities of gait and mobility: Secondary | ICD-10-CM

## 2016-03-24 DIAGNOSIS — M6281 Muscle weakness (generalized): Secondary | ICD-10-CM

## 2016-03-24 DIAGNOSIS — R260 Ataxic gait: Secondary | ICD-10-CM

## 2016-03-24 NOTE — Therapy (Signed)
[facility] [place], [place], [address] Phone: [phone]   Fax:  [phone]  Physical Therapy Treatment  Patient Details  Name: [name] MRN: [id number] Date of Birth: 01-Oct-1935 Referring Provider: Lars Mage NP  Encounter Date: 03/24/2016      PT End of Session - 03/24/16 0908    Visit Number 2   Number of Visits 16   Date for PT Re-Evaluation 05/14/16   PT Start Time 0907   PT Stop Time 0946   PT Time Calculation (min) 39 min   Activity Tolerance Patient tolerated treatment well   Behavior During Therapy [facility] for tasks assessed/performed      Past Medical History:  Diagnosis Date  . Arthritis    OA  . DDD (degenerative disc disease), lumbosacral   . Diabetes mellitus without complication ([place])   . Distal radius fracture    bilateral  . GERD (gastroesophageal reflux disease)   . Glaucoma   . HOH (hard of hearing)    left ear  . Hypertension   . PONV (postoperative nausea and vomiting)     Past Surgical History:  Procedure Laterality Date  . ABDOMINAL HYSTERECTOMY    . BREAST SURGERY     left milk duct   . EYE SURGERY     cataract  . FRACTURE SURGERY Left    hip fracture  . OPEN REDUCTION INTERNAL FIXATION (ORIF) DISTAL RADIAL FRACTURE Bilateral 05/24/2015   Procedure: OPEN REDUCTION INTERNAL FIXATION (ORIF) BILATERAL DISTAL RADIAL FRACTURE VOLAR PLATE;  Surgeon: Daryll Brod, MD;  Location: [facility];  Service: Orthopedics;  Laterality: Bilateral;    There were no vitals filed for this visit.      Subjective Assessment - 03/24/16 0908    Subjective Reports no new falls.   Patient Stated Goals Not fall.   Currently in Pain? No/denies            [facility] PT Assessment - 03/24/16 0001      Assessment   Medical Diagnosis Abnormality of giat.   Hand Dominance Right     Precautions   Precautions Fall     Restrictions   Weight Bearing Restrictions No                      [facility] Adult PT Treatment/Exercise - 03/24/16 0001      Knee/Hip Exercises: Aerobic   Nustep L4 x12 min     Knee/Hip Exercises: Standing   Heel Raises Both;2 sets;10 reps   Heel Raises Limitations B toe raise x20 reps     Knee/Hip Exercises: Seated   Long Arc Quad Strengthening;Both;2 sets;10 reps;Weights   Long Arc Quad Weight 3 lbs.   Clamshell with TheraBand Red  2x10 reps   Hamstring Curl Strengthening;Both;2 sets;10 reps   Hamstring Limitations Red theraband   Sit to Sand 20 reps;without UE support             Balance Exercises - 03/24/16 0936      Balance Exercises: Standing   Standing Eyes Closed Narrow base of support (BOS);Foam/compliant surface  1 UE support x3 min   Tandem Stance Eyes closed;Foam/compliant surface  x3 min with two finger support   SLS Eyes open;Solid surface;Upper extremity support 1  2x10 reps of B hip abduction   Step Ups Forward;6 inch;UE support 2  2x10 reps   Marching Limitations on airex 1 UE support x20 reps  PT Short Term Goals - 03/15/16 0938      PT SHORT TERM GOAL #1   Title Berg score improved to 44/56.   Time 4   Period Weeks   Status New           PT Long Term Goals - 03/15/16 [id number]      PT LONG TERM GOAL #1   Title Independent with a HEP.   Time 8   Period Weeks   Status New     PT LONG TERM GOAL #2   Title Improve Berg score to 50/56.   Time 8   Period Weeks   Status New     PT LONG TERM GOAL #3   Title Right hip and left ankle strength to a solid 4+/5 to increase stabilit for functional activites.   Time 8   Period Weeks   Status New     PT LONG TERM GOAL #4   Title Walk in clinic 500 with a cane without loss of balance.   Time 8   Period Weeks   Status New               Plan - 03/24/16 0950    Clinical Impression Statement Patient arrived to treatment with no report of any new falls. Patient educated regarding LE strengthening and  importance in regards to balance training. Patient able to complete LE strengthening fairly well with minimal to moderate multimodal cueing for proper technique. Patient tolerated balance activities fairly well and did very well with static standing exercises on airex. Patient required max multimodal cueing with SLS activities with hip abduction to promote SLS as she used more hip flexors than abductors regardless of the cueing.    Rehab Potential Excellent   PT Frequency 2x / week   PT Duration 8 weeks   PT Treatment/Interventions ADLs/Self Care Home Management;Gait training;Functional mobility training;Therapeutic activities;Therapeutic exercise;Balance training;Neuromuscular re-education;Patient/family education   PT Next Visit Plan Continue with balance and strengthening per MPT POC.   Consulted and Agree with Plan of Care Patient      Patient will benefit from skilled therapeutic intervention in order to improve the following deficits and impairments:  Decreased activity tolerance, Decreased strength, Decreased balance, Abnormal gait  Visit Diagnosis: Other abnormalities of gait and mobility  Ataxic gait  Muscle weakness (generalized)     Problem List There are no active problems to display for this patient.   Wynelle Fanny, PTA 03/24/2016, 9:59 AM  [facility] [address] [place], [place], [address] Phone: [phone]   Fax:  [phone]  Name: [name] MRN: [id number] Date of Birth: Sep 26, 1935

## 2016-03-29 ENCOUNTER — Ambulatory Visit: Payer: Medicare Other | Admitting: Physical Therapy

## 2016-03-29 ENCOUNTER — Encounter: Payer: Self-pay | Admitting: Physical Therapy

## 2016-03-29 DIAGNOSIS — M6281 Muscle weakness (generalized): Secondary | ICD-10-CM

## 2016-03-29 DIAGNOSIS — R2689 Other abnormalities of gait and mobility: Secondary | ICD-10-CM

## 2016-03-29 DIAGNOSIS — R260 Ataxic gait: Secondary | ICD-10-CM

## 2016-03-29 NOTE — Therapy (Addendum)
[facility] [place], [place], [address] Phone: [phone]   Fax:  [phone]  Physical Therapy Treatment  Patient Details  Name: [name] MRN: [id number] Date of Birth: 05-May-1936 Referring Provider: Lars Mage NP  Encounter Date: 03/29/2016      PT End of Session - 03/29/16 0858    Visit Number 3   Number of Visits 16   Date for PT Re-Evaluation 05/14/16   PT Start Time 0902   PT Stop Time 0942   PT Time Calculation (min) 40 min   Activity Tolerance Patient tolerated treatment well   Behavior During Therapy [facility] for tasks assessed/performed      Past Medical History:  Diagnosis Date  . Arthritis    OA  . DDD (degenerative disc disease), lumbosacral   . Diabetes mellitus without complication ([place])   . Distal radius fracture    bilateral  . GERD (gastroesophageal reflux disease)   . Glaucoma   . HOH (hard of hearing)    left ear  . Hypertension   . PONV (postoperative nausea and vomiting)     Past Surgical History:  Procedure Laterality Date  . ABDOMINAL HYSTERECTOMY    . BREAST SURGERY     left milk duct   . EYE SURGERY     cataract  . FRACTURE SURGERY Left    hip fracture  . OPEN REDUCTION INTERNAL FIXATION (ORIF) DISTAL RADIAL FRACTURE Bilateral 05/24/2015   Procedure: OPEN REDUCTION INTERNAL FIXATION (ORIF) BILATERAL DISTAL RADIAL FRACTURE VOLAR PLATE;  Surgeon: Daryll Brod, MD;  Location: [facility];  Service: Orthopedics;  Laterality: Bilateral;    There were no vitals filed for this visit.      Subjective Assessment - 03/29/16 0857    Subjective No recent falls and reports she did good following last treatment.   Patient Stated Goals Not fall.   Currently in Pain? No/denies            [facility] PT Assessment - 03/29/16 0001      Assessment   Medical Diagnosis Abnormality of giat.   Hand Dominance Right     Precautions   Precautions Fall     Restrictions    Weight Bearing Restrictions No                     [facility] Adult PT Treatment/Exercise - 03/29/16 0001      Knee/Hip Exercises: Aerobic   Nustep L4 x10 min     Knee/Hip Exercises: Seated   Long Arc Quad Strengthening;Both;2 sets;10 reps;Weights   Long Arc Quad Weight 3 lbs.   Clamshell with TheraBand Red  2x10 reps   Hamstring Curl Strengthening;Both;2 sets;10 reps   Hamstring Limitations Red theraband   Sit to Sand 20 reps;without UE support             Balance Exercises - 03/29/16 0923      Balance Exercises: Standing   Standing Eyes Opened Foam/compliant surface  x20 reps of 2# ball reachouts   Standing Eyes Closed Narrow base of support (BOS);Foam/compliant surface  x3 min with 2 finger support   Tandem Stance Eyes closed;Foam/compliant surface  x3 min with 2 finger support   SLS Eyes open;Solid surface;Upper extremity support 2  x20 reps with BLE hip abduction on floor for SLS   Rockerboard Anterior/posterior;EO;UE support  x3 min with 1 UE support   Step Ups Forward;6 inch;UE support 2  2x10 reps each   Heel Raises  Limitations x20 reps   Toe Raise Limitations x20 reps             PT Short Term Goals - 03/15/16 0938      PT SHORT TERM GOAL #1   Title Berg score improved to 44/56.   Time 4   Period Weeks   Status New           PT Long Term Goals - 03/15/16 9163      PT LONG TERM GOAL #1   Title Independent with a HEP.   Time 8   Period Weeks   Status New     PT LONG TERM GOAL #2   Title Improve Berg score to 50/56.   Time 8   Period Weeks   Status New     PT LONG TERM GOAL #3   Title Right hip and left ankle strength to a solid 4+/5 to increase stabilit for functional activites.   Time 8   Period Weeks   Status New     PT LONG TERM GOAL #4   Title Walk in clinic 500 with a cane without loss of balance.   Time 8   Period Weeks   Status New               Plan - 03/29/16 8466    Clinical Impression  Statement Patient tolerated today's treatment well with no reports of new falls. Patient able to complete strengthening and balance activities with only report of intermittant fatigue. Patient required minimal to moderate multimodal cueing for proper exercise technique. Patient demonstrated improved stability with varying UE support on airex with static standing balance activities. Patient demonstrated improved form with standing hip abduction for SLS.   Rehab Potential Excellent   PT Frequency 2x / week   PT Duration 8 weeks   PT Treatment/Interventions ADLs/Self Care Home Management;Gait training;Functional mobility training;Therapeutic activities;Therapeutic exercise;Balance training;Neuromuscular re-education;Patient/family education   PT Next Visit Plan Continue with balance and strengthening per MPT POC.   Consulted and Agree with Plan of Care Patient      Patient will benefit from skilled therapeutic intervention in order to improve the following deficits and impairments:  Decreased activity tolerance, Decreased strength, Decreased balance, Abnormal gait  Visit Diagnosis: Other abnormalities of gait and mobility  Ataxic gait  Muscle weakness (generalized)     Problem List There are no active problems to display for this patient.   Wynelle Fanny, PTA 03/29/2016, 9:56 AM  [facility] [address] [place], [place], [address] Phone: [phone]   Fax:  [phone]  Name: [name] MRN: [id number] Date of Birth: 09/03/1935  PHYSICAL THERAPY DISCHARGE SUMMARY  Visits from Start of Care: 3.  Current functional level related to goals / functional outcomes: See above.   Remaining deficits:    Education / Equipment: HEP. Plan: Patient agrees to discharge.  Patient goals were not met. Patient is being discharged due to not returning since the last visit.  ?????         Mali Applegate MPT

## 2016-04-03 ENCOUNTER — Ambulatory Visit: Payer: Medicare Other | Admitting: Physical Therapy

## 2017-08-06 ENCOUNTER — Other Ambulatory Visit (HOSPITAL_COMMUNITY): Payer: Self-pay | Admitting: Family Medicine

## 2017-08-06 DIAGNOSIS — Z1231 Encounter for screening mammogram for malignant neoplasm of breast: Secondary | ICD-10-CM

## 2017-08-09 ENCOUNTER — Ambulatory Visit (HOSPITAL_COMMUNITY)
Admission: RE | Admit: 2017-08-09 | Discharge: 2017-08-09 | Disposition: A | Discharge status: Home / Self Care | Payer: Medicare Other | Source: Ambulatory Visit | Attending: Family Medicine | Admitting: Family Medicine

## 2017-08-09 ENCOUNTER — Encounter (HOSPITAL_COMMUNITY): Payer: Self-pay

## 2017-08-09 DIAGNOSIS — Z1231 Encounter for screening mammogram for malignant neoplasm of breast: Secondary | ICD-10-CM | POA: Diagnosis present

## 2017-08-16 DIAGNOSIS — F411 Generalized anxiety disorder: Secondary | ICD-10-CM | POA: Insufficient documentation

## 2017-12-18 ENCOUNTER — Other Ambulatory Visit (HOSPITAL_COMMUNITY): Payer: Self-pay | Admitting: Adult Health Nurse Practitioner

## 2017-12-18 DIAGNOSIS — M810 Age-related osteoporosis without current pathological fracture: Secondary | ICD-10-CM

## 2017-12-25 ENCOUNTER — Other Ambulatory Visit (HOSPITAL_COMMUNITY): Payer: Medicare Other

## 2017-12-25 ENCOUNTER — Ambulatory Visit (HOSPITAL_COMMUNITY)
Admission: RE | Admit: 2017-12-25 | Discharge: 2017-12-25 | Disposition: A | Discharge status: Home / Self Care | Payer: Medicare Other | Source: Ambulatory Visit | Attending: Adult Health Nurse Practitioner | Admitting: Adult Health Nurse Practitioner

## 2017-12-25 ENCOUNTER — Other Ambulatory Visit: Payer: Medicare Other

## 2017-12-25 DIAGNOSIS — M810 Age-related osteoporosis without current pathological fracture: Secondary | ICD-10-CM | POA: Insufficient documentation

## 2018-10-02 ENCOUNTER — Other Ambulatory Visit (HOSPITAL_COMMUNITY): Payer: Self-pay | Admitting: Family Medicine

## 2018-10-02 DIAGNOSIS — Z1231 Encounter for screening mammogram for malignant neoplasm of breast: Secondary | ICD-10-CM

## 2018-12-02 ENCOUNTER — Other Ambulatory Visit: Payer: Self-pay

## 2018-12-02 ENCOUNTER — Ambulatory Visit (HOSPITAL_COMMUNITY)
Admission: RE | Admit: 2018-12-02 | Discharge: 2018-12-02 | Disposition: A | Discharge status: Home / Self Care | Payer: Medicare Other | Source: Ambulatory Visit | Attending: Family Medicine | Admitting: Family Medicine

## 2018-12-02 DIAGNOSIS — Z1231 Encounter for screening mammogram for malignant neoplasm of breast: Secondary | ICD-10-CM | POA: Insufficient documentation

## 2019-01-06 DIAGNOSIS — J0100 Acute maxillary sinusitis, unspecified: Secondary | ICD-10-CM | POA: Diagnosis not present

## 2019-01-06 DIAGNOSIS — H9312 Tinnitus, left ear: Secondary | ICD-10-CM | POA: Diagnosis not present

## 2019-01-06 DIAGNOSIS — H9192 Unspecified hearing loss, left ear: Secondary | ICD-10-CM | POA: Diagnosis not present

## 2019-01-27 DIAGNOSIS — H10013 Acute follicular conjunctivitis, bilateral: Secondary | ICD-10-CM | POA: Diagnosis not present

## 2019-01-29 ENCOUNTER — Other Ambulatory Visit: Payer: Self-pay | Admitting: *Deleted

## 2019-01-29 DIAGNOSIS — Z20822 Contact with and (suspected) exposure to covid-19: Secondary | ICD-10-CM

## 2019-01-29 DIAGNOSIS — R6889 Other general symptoms and signs: Secondary | ICD-10-CM | POA: Diagnosis not present

## 2019-01-31 LAB — NOVEL CORONAVIRUS, NAA: SARS-CoV-2, NAA: NOT DETECTED

## 2019-02-03 ENCOUNTER — Ambulatory Visit: Payer: Self-pay | Admitting: Family Medicine

## 2019-02-11 ENCOUNTER — Other Ambulatory Visit: Payer: Self-pay

## 2019-02-12 ENCOUNTER — Encounter: Payer: Self-pay | Admitting: Family Medicine

## 2019-02-12 ENCOUNTER — Ambulatory Visit (INDEPENDENT_AMBULATORY_CARE_PROVIDER_SITE_OTHER): Payer: Medicare Other | Admitting: Family Medicine

## 2019-02-12 VITALS — BP 142/72 | HR 69 | Temp 97.6°F | Ht 64.0 in | Wt 144.0 lb

## 2019-02-12 DIAGNOSIS — E1169 Type 2 diabetes mellitus with other specified complication: Secondary | ICD-10-CM | POA: Diagnosis not present

## 2019-02-12 DIAGNOSIS — E1159 Type 2 diabetes mellitus with other circulatory complications: Secondary | ICD-10-CM

## 2019-02-12 DIAGNOSIS — Z23 Encounter for immunization: Secondary | ICD-10-CM | POA: Diagnosis not present

## 2019-02-12 DIAGNOSIS — E1165 Type 2 diabetes mellitus with hyperglycemia: Secondary | ICD-10-CM | POA: Diagnosis not present

## 2019-02-12 DIAGNOSIS — E785 Hyperlipidemia, unspecified: Secondary | ICD-10-CM | POA: Diagnosis not present

## 2019-02-12 DIAGNOSIS — Z7689 Persons encountering health services in other specified circumstances: Secondary | ICD-10-CM

## 2019-02-12 DIAGNOSIS — E119 Type 2 diabetes mellitus without complications: Secondary | ICD-10-CM | POA: Diagnosis not present

## 2019-02-12 DIAGNOSIS — I152 Hypertension secondary to endocrine disorders: Secondary | ICD-10-CM

## 2019-02-12 DIAGNOSIS — I10 Essential (primary) hypertension: Secondary | ICD-10-CM | POA: Diagnosis not present

## 2019-02-12 LAB — CMP14+EGFR
ALT: 22 IU/L (ref 0–32)
AST: 21 IU/L (ref 0–40)
Albumin/Globulin Ratio: 1.5 (ref 1.2–2.2)
Albumin: 4.1 g/dL (ref 3.7–4.7)
Alkaline Phosphatase: 64 IU/L (ref 39–117)
BUN/Creatinine Ratio: 18 (ref 12–28)
BUN: 16 mg/dL (ref 8–27)
Bilirubin Total: 0.3 mg/dL (ref 0.0–1.2)
CO2: 24 mmol/L (ref 20–29)
Calcium: 9.2 mg/dL (ref 8.7–10.3)
Chloride: 99 mmol/L (ref 96–106)
Creatinine, Ser: 0.9 mg/dL (ref 0.57–1.00)
GFR calc Af Amer: 70 mL/min/{1.73_m2} (ref >59)
GFR calc non Af Amer: 61 mL/min/{1.73_m2} (ref >59)
Globulin, Total: 2.7 g/dL (ref 1.5–4.5)
Glucose: 139 mg/dL — ABNORMAL HIGH (ref 65–99)
Potassium: 4.3 mmol/L (ref 3.5–5.2)
Sodium: 139 mmol/L (ref 134–144)
Total Protein: 6.8 g/dL (ref 6.0–8.5)

## 2019-02-12 LAB — BAYER DCA HB A1C WAIVED: HB A1C (BAYER DCA - WAIVED): 8.4 % — ABNORMAL HIGH (ref <7.0)

## 2019-02-12 MED ORDER — GLUCOSE BLOOD VI STRP: ORAL_STRIP | 12 refills | Status: DC

## 2019-02-12 NOTE — Progress Notes (Signed)
Subjective: MN:OTRRNHAFB care, type 2 diabetes HPI: [name] is a 83 y.o. female presenting to clinic today for:  1. Type 2 Diabetes w/ HTN and HLD:  Patient reports onset of type 2 diabetes about 10 to 15 years ago.  She has been on insulin since 2018. Patient reports: Glucometer: One Touch ultra. FBG 120-130s.  She reports compliance with metformin 500 mg twice daily, Glucotrol 10 mg daily and 16 units of Lantus subcu nightly.  She notes that the Lantus was actually reduced at her last office visit because she was having hypoglycemic episodes overnight.  She is on atorvastatin 20 mg daily for cholesterol.  She takes Norvasc 10 mg daily and hydrochlorothiazide 12.5 mg daily for blood pressure.  Last eye exam: had w/ Dr Marin Comment yesterday Last foot exam: needs Last A1c: 7.9 on 10/17/2018 w/ Dr Murrell Redden office Nephropathy screen indicated?: yes Last flu, zoster and/or pneumovax:  Needs Flu Immunization History  Administered Date(s) Administered  . Influenza Split 02/07/2016  . Influenza, High Dose Seasonal PF 01/12/2015, 02/18/2018  . Influenza,inj,Quad PF,6+ Mos 02/20/2018  . Influenza,trivalent, recombinat, inj, PF 02/08/2017  . Pneumococcal Conjugate-13 01/12/2015  . Pneumococcal Polysaccharide-23 06/12/2016  . Tdap 09/17/2017  . Zoster 01/12/2015    ROS: Denies dizziness, LOC, polyuria, polydipsia, unintended weight loss/gain, foot ulcerations, numbness or tingling in extremities, shortness of breath or chest pain.   Past Medical History:  Diagnosis Date  . Arthritis    OA  . DDD (degenerative disc disease), lumbosacral   . Diabetes mellitus without complication ([place])   . Distal radius fracture    bilateral  . GERD (gastroesophageal reflux disease)   . Glaucoma   . HOH (hard of hearing)    left ear  . Hypertension   . PONV (postoperative nausea and vomiting)    Past Surgical History:  Procedure Laterality Date  . ABDOMINAL HYSTERECTOMY    . BREAST BIOPSY    .  BREAST EXCISIONAL BIOPSY Left    benign  . BREAST SURGERY     left milk duct   . EYE SURGERY     cataract  . FRACTURE SURGERY Left    hip fracture  . OPEN REDUCTION INTERNAL FIXATION (ORIF) DISTAL RADIAL FRACTURE Bilateral 05/24/2015   Procedure: OPEN REDUCTION INTERNAL FIXATION (ORIF) BILATERAL DISTAL RADIAL FRACTURE VOLAR PLATE;  Surgeon: Daryll Brod, MD;  Location: [facility];  Service: Orthopedics;  Laterality: Bilateral;   Social History   Socioeconomic History  . Marital status: Married    Spouse name: Not on file  . Number of children: Not on file  . Years of education: Not on file  . Highest education level: Not on file  Occupational History  . Not on file  Social Needs  . Financial resource strain: Not on file  . Food insecurity    Worry: Not on file    Inability: Not on file  . Transportation needs    Medical: Not on file    Non-medical: Not on file  Tobacco Use  . Smoking status: Never Smoker  . Smokeless tobacco: Never Used  Substance and Sexual Activity  . Alcohol use: No  . Drug use: No  . Sexual activity: Not on file  Lifestyle  . Physical activity    Days per week: Not on file    Minutes per session: Not on file  . Stress: Not on file  Relationships  . Social connections    Talks on phone: Not on file  Gets together: Not on file    Attends religious service: Not on file    Active member of club or organization: Not on file    Attends meetings of clubs or organizations: Not on file    Relationship status: Not on file  . Intimate partner violence    Fear of current or ex partner: Not on file    Emotionally abused: Not on file    Physically abused: Not on file    Forced sexual activity: Not on file  Other Topics Concern  . Not on file  Social History Narrative  . Not on file   Current Meds  Medication Sig  . amLODipine (NORVASC) 10 MG tablet Take 10 mg by mouth daily.  Marland Kitchen aspirin 81 MG tablet Take 81 mg by mouth daily.  Marland Kitchen  atorvastatin (LIPITOR) 20 MG tablet Take 20 mg by mouth daily.  . calcium-vitamin D (OSCAL WITH D) 500-200 MG-UNIT tablet Take 1 tablet by mouth.  . fluticasone (FLONASE) 50 MCG/ACT nasal spray Place into both nostrils daily.  Marland Kitchen glipiZIDE (GLUCOTROL) 10 MG tablet Take 10 mg by mouth daily before breakfast.  . hydrochlorothiazide (HYDRODIURIL) 25 MG tablet TAKE 1 TABLET BY MOUTH ONCE DAILY IN THE MORNING  . ibandronate (BONIVA) 150 MG tablet Take 150 mg by mouth every 30 (thirty) days. Take in the morning with a full glass of water, on an empty stomach, and do not take anything else by mouth or lie down for the next 30 min.  . insulin glargine (LANTUS) 100 UNIT/ML injection Inject 16 Units into the skin at bedtime.   Marland Kitchen latanoprost (XALATAN) 0.005 % ophthalmic solution 1 drop at bedtime.  . metFORMIN (GLUCOPHAGE) 500 MG tablet Take 500 mg by mouth 2 (two) times daily with a meal.  . metoprolol (LOPRESSOR) 50 MG tablet Take 50 mg by mouth 2 (two) times daily.  . multivitamin-iron-minerals-folic acid (CENTRUM) chewable tablet Chew 1 tablet by mouth daily.  Earney Navy Bicarbonate (ZEGERID) 20-1100 MG CAPS capsule Take 1 capsule by mouth daily before breakfast.  . timolol (TIMOPTIC) 0.5 % ophthalmic solution INSTILL 1 DROP INTO EACH EYE ONCE DAILY   Family History  Problem Relation Age of Onset  . Heart disease Mother   . Heart disease Father    Allergies  Allergen Reactions  . Codeine Nausea Only    ROS: Per HPI  Objective: Office vital signs reviewed. BP (!) 142/72   Pulse 69   Temp 97.6 F (36.4 C) (Temporal)   Ht 5' 4"  (1.626 m)   Wt 144 lb (65.3 kg)   SpO2 96%   BMI 24.72 kg/m   Physical Examination:  General: Awake, alert, well nourished, No acute distress HEENT: Normal, sclera white, MMM Cardio: regular rate and rhythm, S1S2 heard, no murmurs appreciated Pulm: clear to auscultation bilaterally, no wheezes, rhonchi or rales; normal work of breathing on room air  Extremities: warm, well perfused, No edema, cyanosis or clubbing; +2 pulses bilaterally MSK: normal gait and hunched station; she has marked kyphosis in the thoracic region noted. Skin: dry; intact; no rashes or lesions Neuro: no focal neurologic deficits Psych: mood stable, speech normal. Depression screen [facility] 2/9 02/12/2019  Decreased Interest 0  Down, Depressed, Hopeless 0  PHQ - 2 Score 0  Altered sleeping 0  Tired, decreased energy 0  Change in appetite 0  Feeling bad or failure about yourself  0  Trouble concentrating 0  Moving slowly or fidgety/restless 0  Suicidal thoughts 0  PHQ-9  Score 0   Assessment/ Plan: 83 y.o. female   1. Uncontrolled type 2 diabetes mellitus with hyperglycemia ([place]) Her sugar has increased quite a bit since her last visit in June with her previous PCP.  Her A1c today was 8.4.  I reinforced that she should keep a close eye on blood sugars.  May need to consider adding additional medication to cover possible postprandial hyperglycemia as her fasting morning blood sugars do not sound elevated based on her reports today.  May need to consider addition of Januvia plus her metformin.  In the absence of renal disease, she should be able to tolerate 50-500 mg twice daily.  We discussed that this may be needed if her blood sugar remains above 8 at her next visit.  Given age and history of hypoglycemia would make A1c goal between 7.5 and 8. - CMP14+EGFR - Bayer DCA Hb A1c Waived - glucose blood test strip; Test BG up to 2 times daily Dx (E 11.65)  Dispense: 100 each; Refill: 12  2. Hypertension associated with diabetes ([place]) Controlled.  Continue current regimen.  No refills needed.  Unclear as to why her losartan was discontinued but I suspect it is because she had a mild hyperkalemic episode during her last metabolic panel.  Urine microalbumin was noted to be slightly abnormal with an elevated ratio.  We will plan to update this again in February.  Could trial  low-dose ARB again in the future with close monitoring of potassium given absence of reduced GFR. - CMP14+EGFR  3. Hyperlipidemia associated with type 2 diabetes mellitus (HCC) Continue statin. - CMP14+EGFR  4. Establishing care with new doctor, encounter for I reviewed her last office visit note in the EMR.   Janora Norlander, DO [place] [phone]

## 2019-02-17 ENCOUNTER — Ambulatory Visit (INDEPENDENT_AMBULATORY_CARE_PROVIDER_SITE_OTHER): Payer: Medicare Other | Admitting: Otolaryngology

## 2019-02-17 ENCOUNTER — Other Ambulatory Visit: Payer: Self-pay

## 2019-02-17 DIAGNOSIS — H903 Sensorineural hearing loss, bilateral: Secondary | ICD-10-CM

## 2019-02-17 DIAGNOSIS — H6121 Impacted cerumen, right ear: Secondary | ICD-10-CM | POA: Diagnosis not present

## 2019-02-17 DIAGNOSIS — H9312 Tinnitus, left ear: Secondary | ICD-10-CM | POA: Diagnosis not present

## 2019-02-20 ENCOUNTER — Other Ambulatory Visit: Payer: Self-pay | Admitting: *Deleted

## 2019-02-20 MED ORDER — GLIPIZIDE 10 MG PO TABS: 10.0000 mg | ORAL_TABLET | Freq: Every day | ORAL | 1 refills | Status: DC

## 2019-04-30 ENCOUNTER — Other Ambulatory Visit: Payer: Self-pay | Admitting: *Deleted

## 2019-04-30 MED ORDER — HYDROCHLOROTHIAZIDE 25 MG PO TABS: 25.0000 mg | ORAL_TABLET | Freq: Every day | ORAL | 1 refills | Status: DC

## 2019-04-30 MED ORDER — METFORMIN HCL 500 MG PO TABS: 500.0000 mg | ORAL_TABLET | Freq: Two times a day (BID) | ORAL | 1 refills | Status: DC

## 2019-04-30 NOTE — Telephone Encounter (Signed)
What med?

## 2019-05-12 ENCOUNTER — Telehealth: Payer: Self-pay | Admitting: Family Medicine

## 2019-05-12 NOTE — Telephone Encounter (Signed)
The only thing that she is due for is her A1c.  Cholesterol panel is not due until June and her last metabolic panels were within normal limits with the exception of her elevated blood sugar.  She is welcome to come in for A1c prior to her appointment but we would know that result same day if she just wishes to wait for her office visit

## 2019-05-12 NOTE — Telephone Encounter (Signed)
Ca we order labs before appointment?

## 2019-05-13 NOTE — Telephone Encounter (Signed)
Patient notified

## 2019-05-16 ENCOUNTER — Other Ambulatory Visit: Payer: Self-pay

## 2019-05-19 ENCOUNTER — Ambulatory Visit (INDEPENDENT_AMBULATORY_CARE_PROVIDER_SITE_OTHER): Payer: Medicare Other | Admitting: Family Medicine

## 2019-05-19 ENCOUNTER — Other Ambulatory Visit: Payer: Self-pay

## 2019-05-19 ENCOUNTER — Encounter: Payer: Self-pay | Admitting: Family Medicine

## 2019-05-19 VITALS — BP 184/62 | HR 72 | Temp 97.7°F | Ht 64.0 in | Wt 143.0 lb

## 2019-05-19 DIAGNOSIS — E1165 Type 2 diabetes mellitus with hyperglycemia: Secondary | ICD-10-CM | POA: Diagnosis not present

## 2019-05-19 DIAGNOSIS — E1169 Type 2 diabetes mellitus with other specified complication: Secondary | ICD-10-CM | POA: Diagnosis not present

## 2019-05-19 DIAGNOSIS — E1159 Type 2 diabetes mellitus with other circulatory complications: Secondary | ICD-10-CM

## 2019-05-19 DIAGNOSIS — E785 Hyperlipidemia, unspecified: Secondary | ICD-10-CM

## 2019-05-19 DIAGNOSIS — I10 Essential (primary) hypertension: Secondary | ICD-10-CM | POA: Diagnosis not present

## 2019-05-19 LAB — BAYER DCA HB A1C WAIVED: HB A1C (BAYER DCA - WAIVED): 9 % — ABNORMAL HIGH (ref <7.0)

## 2019-05-19 MED ORDER — JANUMET 50-1000 MG PO TABS: 1.0000 | ORAL_TABLET | Freq: Two times a day (BID) | ORAL | 5 refills | Status: DC

## 2019-05-19 NOTE — Progress Notes (Signed)
Subjective: CC: f/u uncontrolled DM2 HPI: [name] is a 84 y.o. female presenting to clinic today for:  1. Type 2 Diabetes w/ HTN and HLD:  Patient reports onset of type 2 diabetes about 10 to 15 years ago.  She has been on insulin since 2018. Patient reports: Glucometer: One Touch ultra. FBG 100-150s.  High: 200s. No lows.  She reports compliance with metformin 1000 mg twice daily (was sent in as 500mg  BID but patient says she is taking 2 tablet BID), Glucotrol 10 mg daily and 16 units of Lantus subq nightly.  She is on atorvastatin 20 mg daily for cholesterol.  She takes Norvasc 10 mg daily and hydrochlorothiazide 12.5 mg daily for blood pressure.  She admits to eating quite a bit of potatoes and other dietary indiscretions during Christmas time.  She reports increased stress due to multiple family members being ill and/or hospitalized.  Her sister currently sustained several broken ribs and is in extreme pain.  She called her shortly before the visit to ask her to come to the home.  Last eye exam: had w/ Dr Marin Comment 2020 Last foot exam: UTD Last A1c:  Lab Results  Component Value Date   HGBA1C 8.4 (H) 02/12/2019  Nephropathy screen indicated?: yes Last flu, zoster and/or pneumovax:  UTD Immunization History  Administered Date(s) Administered  . Fluad Quad(high Dose 65+) 02/12/2019  . Influenza Split 02/07/2016, 02/08/2017  . Influenza, High Dose Seasonal PF 01/12/2015, 02/18/2018  . Influenza,inj,Quad PF,6+ Mos 02/20/2018  . Influenza,trivalent, recombinat, inj, PF 02/17/2014, 02/08/2017  . Pneumococcal Conjugate-13 01/12/2015  . Pneumococcal Polysaccharide-23 06/12/2016  . Tdap 09/17/2017  . Zoster 01/12/2015    ROS: Denies dizziness, LOC, polyuria, polydipsia, unintended weight loss/gain, foot ulcerations, numbness or tingling in extremities, shortness of breath or chest pain.    Past Medical History:  Diagnosis Date  . Arthritis    OA  . DDD (degenerative disc disease),  lumbosacral   . Diabetes mellitus without complication ([place])   . Distal radius fracture    bilateral  . GERD (gastroesophageal reflux disease)   . Glaucoma   . HOH (hard of hearing)    left ear  . Hypertension   . PONV (postoperative nausea and vomiting)    Past Surgical History:  Procedure Laterality Date  . ABDOMINAL HYSTERECTOMY    . BREAST BIOPSY    . BREAST EXCISIONAL BIOPSY Left    benign  . BREAST SURGERY     left milk duct   . EYE SURGERY     cataract  . FRACTURE SURGERY Left    hip fracture  . OPEN REDUCTION INTERNAL FIXATION (ORIF) DISTAL RADIAL FRACTURE Bilateral 05/24/2015   Procedure: OPEN REDUCTION INTERNAL FIXATION (ORIF) BILATERAL DISTAL RADIAL FRACTURE VOLAR PLATE;  Surgeon: Daryll Brod, MD;  Location: [facility];  Service: Orthopedics;  Laterality: Bilateral;   Social History   Socioeconomic History  . Marital status: Married    Spouse name: Not on file  . Number of children: Not on file  . Years of education: Not on file  . Highest education level: Not on file  Occupational History  . Not on file  Tobacco Use  . Smoking status: Never Smoker  . Smokeless tobacco: Never Used  Substance and Sexual Activity  . Alcohol use: No  . Drug use: No  . Sexual activity: Not on file  Other Topics Concern  . Not on file  Social History Narrative  . Not on file  Social Determinants of Health   Financial Resource Strain:   . Difficulty of Paying Living Expenses: Not on file  Food Insecurity:   . Worried About Charity fundraiser in the Last Year: Not on file  . Ran Out of Food in the Last Year: Not on file  Transportation Needs:   . Lack of Transportation (Medical): Not on file  . Lack of Transportation (Non-Medical): Not on file  Physical Activity:   . Days of Exercise per Week: Not on file  . Minutes of Exercise per Session: Not on file  Stress:   . Feeling of Stress : Not on file  Social Connections:   . Frequency of Communication  with Friends and Family: Not on file  . Frequency of Social Gatherings with Friends and Family: Not on file  . Attends Religious Services: Not on file  . Active Member of Clubs or Organizations: Not on file  . Attends Archivist Meetings: Not on file  . Marital Status: Not on file  Intimate Partner Violence:   . Fear of Current or Ex-Partner: Not on file  . Emotionally Abused: Not on file  . Physically Abused: Not on file  . Sexually Abused: Not on file   No outpatient medications have been marked as taking for the 05/19/19 encounter (Appointment) with Janora Norlander, DO.   Family History  Problem Relation Age of Onset  . Heart disease Mother   . Heart disease Father    Allergies  Allergen Reactions  . Codeine Nausea Only    ROS: Per HPI  Objective: Office vital signs reviewed. BP (!) 184/62   Pulse 72   Temp 97.7 F (36.5 C) (Temporal)   Ht 5\' 4"  (1.626 m)   Wt 143 lb (64.9 kg)   SpO2 96%   BMI 24.55 kg/m   Physical Examination:  General: Awake, alert, well nourished, No acute distress HEENT: Normal, sclera white, MMM Cardio: regular rate and rhythm, S1S2 heard, no murmurs appreciated Pulm: clear to auscultation bilaterally, no wheezes, rhonchi or rales; normal work of breathing on room air Extremities: warm, well perfused, No edema, cyanosis or clubbing; +2 pulses bilaterally Neuro: no focal neurologic deficits  Assessment/ Plan: 84 y.o. female   1. Uncontrolled type 2 diabetes mellitus with hyperglycemia (HCC) Blood sugars continue to be uncontrolled with rising A1c to 9.0 today.  Patient continues to intake quite a bit of carbohydrates.  She seems compliant with her medications though is not making much headway on lifestyle modification.  I am adding Januvia to her metformin today.  We will likely need to increase the Lantus but unfortunately she did not bring any blood sugar logs in for review today.  I have given her a handout for logging blood  sugar every morning and 2 hours postprandial.  I have also asked her to monitor her blood pressures at home, which were also uncontrolled.  She will follow-up in 1 month for discussion of the results and we will titrate medication accordingly.  If unable to obtain a sound plan where her blood sugar is controlled again, may need to consider referral to endocrinology - Microalbumin / creatinine urine ratio - Bayer DCA Hb A1c Waived - hgba1c  2. Hypertension associated with diabetes ([place]) Not controlled.  She is compliant with medications.  May be due to stress, as her sister is home with several broken ribs.  3. Hyperlipidemia associated with type 2 diabetes mellitus ([place]) Continue statin    Kasen Sako M  Lajuana Ripple, [place] [phone]

## 2019-05-19 NOTE — Patient Instructions (Addendum)
STOP Metformin.   START Janumet (this has metformin already in it)  Check your blood sugar every morning and 2 hours after every meal.  Record and bring to your next office visit in 1 month so we can review.    Check your blood pressure daily and record.  If blood pressure is above 150/90, please contact the office.  If you prefer this to be a telephone visit, that is fine but please have your blood sugar and blood pressure log so we can go over it.

## 2019-05-20 LAB — MICROALBUMIN / CREATININE URINE RATIO
Creatinine, Urine: 152.3 mg/dL
Microalb/Creat Ratio: 69 mg/g creat — ABNORMAL HIGH (ref 0–29)
Microalbumin, Urine: 105.6 ug/mL

## 2019-06-16 DIAGNOSIS — H905 Unspecified sensorineural hearing loss: Secondary | ICD-10-CM | POA: Diagnosis not present

## 2019-06-17 ENCOUNTER — Other Ambulatory Visit: Payer: Self-pay | Admitting: Family Medicine

## 2019-06-17 ENCOUNTER — Telehealth: Payer: Self-pay | Admitting: Family Medicine

## 2019-06-17 MED ORDER — JANUMET 50-1000 MG PO TABS: 1.0000 | ORAL_TABLET | Freq: Two times a day (BID) | ORAL | 2 refills | Status: DC

## 2019-06-17 NOTE — Telephone Encounter (Signed)
Appt scheduled

## 2019-06-17 NOTE — Telephone Encounter (Signed)
What is the name of the medication? Janumet 50-1000 - Patient is taking two instead of one and is out.   Have you contacted your pharmacy to request a refill? Yes  Which pharmacy would you like this sent to? [facility]   Patient notified that their request is being sent to the clinical staff for review and that they should receive a call once it is complete. If they do not receive a call within 24 hours they can check with their pharmacy or our office.

## 2019-06-18 ENCOUNTER — Other Ambulatory Visit: Payer: Self-pay

## 2019-06-18 ENCOUNTER — Ambulatory Visit (INDEPENDENT_AMBULATORY_CARE_PROVIDER_SITE_OTHER): Payer: Medicare Other | Admitting: Family Medicine

## 2019-06-18 ENCOUNTER — Encounter: Payer: Self-pay | Admitting: Family Medicine

## 2019-06-18 VITALS — BP 186/73 | HR 78 | Temp 98.4°F | Ht 64.0 in | Wt 146.0 lb

## 2019-06-18 DIAGNOSIS — I10 Essential (primary) hypertension: Secondary | ICD-10-CM

## 2019-06-18 DIAGNOSIS — E1129 Type 2 diabetes mellitus with other diabetic kidney complication: Secondary | ICD-10-CM | POA: Diagnosis not present

## 2019-06-18 DIAGNOSIS — R809 Proteinuria, unspecified: Secondary | ICD-10-CM

## 2019-06-18 MED ORDER — LISINOPRIL 10 MG PO TABS: 10.0000 mg | ORAL_TABLET | Freq: Every day | ORAL | 2 refills | Status: DC

## 2019-06-18 NOTE — Progress Notes (Signed)
Subjective: CC: Follow-up hypertension PCP: Janora Norlander, DO [id number] C Thorup is a 84 y.o. female presenting to clinic today for:  1.  Hypertension Patient with ongoing uncontrolled hypertension.  She brings a blood pressure log from home which show systolics typically in the 150s to 160s.  No chest pain, shortness of breath, dizziness.  Of note she also notes that the blood sugars tend to be getting better with most of them less than 150 on the Janumet.  No intolerance to new medication.  She reports compliance with Norvasc 10 mg daily and hydrochlorothiazide 25 mg daily.  Her last urine microalbumin was positive for microalbumin urea.  Not currently on an ACE or ARB.   ROS: Per HPI  Allergies  Allergen Reactions  . Codeine Nausea Only   Past Medical History:  Diagnosis Date  . Arthritis    OA  . DDD (degenerative disc disease), lumbosacral   . Diabetes mellitus without complication ([place])   . Distal radius fracture    bilateral  . GERD (gastroesophageal reflux disease)   . Glaucoma   . HOH (hard of hearing)    left ear  . Hypertension   . PONV (postoperative nausea and vomiting)     Current Outpatient Medications:  .  amLODipine (NORVASC) 10 MG tablet, Take 10 mg by mouth daily., Disp: , Rfl:  .  aspirin 81 MG tablet, Take 81 mg by mouth daily., Disp: , Rfl:  .  atorvastatin (LIPITOR) 20 MG tablet, Take 20 mg by mouth daily., Disp: , Rfl:  .  calcium-vitamin D (OSCAL WITH D) 500-200 MG-UNIT tablet, Take 1 tablet by mouth., Disp: , Rfl:  .  fluticasone (FLONASE) 50 MCG/ACT nasal spray, Place into both nostrils daily., Disp: , Rfl:  .  glipiZIDE (GLUCOTROL) 10 MG tablet, Take 1 tablet (10 mg total) by mouth daily before breakfast., Disp: 90 tablet, Rfl: 1 .  glucose blood test strip, Test BG up to 2 times daily Dx (E 11.65), Disp: 100 each, Rfl: 12 .  hydrochlorothiazide (HYDRODIURIL) 25 MG tablet, Take 1 tablet (25 mg total) by mouth daily., Disp: 30 tablet, Rfl: 1  .  ibandronate (BONIVA) 150 MG tablet, Take 150 mg by mouth every 30 (thirty) days. Take in the morning with a full glass of water, on an empty stomach, and do not take anything else by mouth or lie down for the next 30 min., Disp: , Rfl:  .  insulin glargine (LANTUS) 100 UNIT/ML injection, Inject 16 Units into the skin at bedtime. , Disp: , Rfl:  .  latanoprost (XALATAN) 0.005 % ophthalmic solution, 1 drop at bedtime., Disp: , Rfl:  .  metoprolol (LOPRESSOR) 50 MG tablet, Take 50 mg by mouth 2 (two) times daily., Disp: , Rfl:  .  multivitamin-iron-minerals-folic acid (CENTRUM) chewable tablet, Chew 1 tablet by mouth daily., Disp: , Rfl:  .  Omeprazole-Sodium Bicarbonate (ZEGERID) 20-1100 MG CAPS capsule, Take 1 capsule by mouth daily before breakfast., Disp: , Rfl:  .  sitaGLIPtin-metformin (JANUMET) 50-1000 MG tablet, Take 1 tablet by mouth 2 (two) times daily with a meal., Disp: 60 tablet, Rfl: 2 .  timolol (TIMOPTIC) 0.5 % ophthalmic solution, INSTILL 1 DROP INTO EACH EYE ONCE DAILY, Disp: , Rfl:  Social History   Socioeconomic History  . Marital status: Married    Spouse name: Not on file  . Number of children: Not on file  . Years of education: Not on file  . Highest education level: Not on  file  Occupational History  . Not on file  Tobacco Use  . Smoking status: Never Smoker  . Smokeless tobacco: Never Used  Substance and Sexual Activity  . Alcohol use: No  . Drug use: No  . Sexual activity: Not on file  Other Topics Concern  . Not on file  Social History Narrative  . Not on file   Social Determinants of Health   Financial Resource Strain:   . Difficulty of Paying Living Expenses: Not on file  Food Insecurity:   . Worried About Charity fundraiser in the Last Year: Not on file  . Ran Out of Food in the Last Year: Not on file  Transportation Needs:   . Lack of Transportation (Medical): Not on file  . Lack of Transportation (Non-Medical): Not on file  Physical Activity:    . Days of Exercise per Week: Not on file  . Minutes of Exercise per Session: Not on file  Stress:   . Feeling of Stress : Not on file  Social Connections:   . Frequency of Communication with Friends and Family: Not on file  . Frequency of Social Gatherings with Friends and Family: Not on file  . Attends Religious Services: Not on file  . Active Member of Clubs or Organizations: Not on file  . Attends Archivist Meetings: Not on file  . Marital Status: Not on file  Intimate Partner Violence:   . Fear of Current or Ex-Partner: Not on file  . Emotionally Abused: Not on file  . Physically Abused: Not on file  . Sexually Abused: Not on file   Family History  Problem Relation Age of Onset  . Heart disease Mother   . Heart disease Father     Objective: Office vital signs reviewed. BP (!) 186/73   Pulse 78   Temp 98.4 F (36.9 C) (Temporal)   Ht 5\' 4"  (1.626 m)   Wt 146 lb (66.2 kg)   SpO2 97%   BMI 25.06 kg/m   Physical Examination:  General: Awake, alert, well appearing female, No acute distress HEENT: Normal, sclera white, MMM Cardio: regular rate and rhythm, S1S2 heard Pulm: clear to auscultation bilaterally, no wheezes, rhonchi or rales; normal work of breathing on room air Extremities: warm, well perfused, No edema, cyanosis or clubbing; +2 pulses bilaterally   Assessment/ Plan: 84 y.o. female   1. Uncontrolled hypertension Uncontrolled hypertension.  Blood pressure was elevated even above what her blood pressure readings are at home but I suspect this may be stress related to her sister's recent lumpectomy.  She was discharged from the hospital today.  I will start lisinopril 10 mg daily.  I would like her to come back in 1 week for BMP and repeat blood pressure check with RN.  She was good understanding of the plan and will continue monitoring blood pressures and blood sugars at home.  She will follow-up with me in 2 months for repeat A1c - lisinopril  (ZESTRIL) 10 MG tablet; Take 1 tablet (10 mg total) by mouth daily.  Dispense: 30 tablet; Refill: 2 - Basic Metabolic Panel; Future  2. Microalbuminuria due to type 2 diabetes mellitus (HCC) - lisinopril (ZESTRIL) 10 MG tablet; Take 1 tablet (10 mg total) by mouth daily.  Dispense: 30 tablet; Refill: 2   No orders of the defined types were placed in this encounter.  No orders of the defined types were placed in this encounter.    Janora Norlander, DO  [place] [phone]

## 2019-06-18 NOTE — Patient Instructions (Signed)
Pick up Lisinopril 10mg  and take 1 tablet daily.  Continue your other medications. Come back in ONE week for repeat blood pressure check with the nurse.   We will draw blood that day too to check your kidneys. Sugar is looking good.  You should be able to pick up your medication.

## 2019-06-25 ENCOUNTER — Other Ambulatory Visit: Payer: Self-pay

## 2019-06-25 ENCOUNTER — Other Ambulatory Visit: Payer: Self-pay | Admitting: Family Medicine

## 2019-06-25 ENCOUNTER — Other Ambulatory Visit: Payer: Medicare Other

## 2019-06-25 ENCOUNTER — Ambulatory Visit (INDEPENDENT_AMBULATORY_CARE_PROVIDER_SITE_OTHER): Payer: Medicare Other | Admitting: *Deleted

## 2019-06-25 VITALS — BP 192/65 | HR 56

## 2019-06-25 DIAGNOSIS — I10 Essential (primary) hypertension: Secondary | ICD-10-CM | POA: Diagnosis not present

## 2019-06-25 DIAGNOSIS — E1129 Type 2 diabetes mellitus with other diabetic kidney complication: Secondary | ICD-10-CM

## 2019-06-25 MED ORDER — LISINOPRIL 10 MG PO TABS: 20.0000 mg | ORAL_TABLET | Freq: Every day | ORAL | 2 refills | Status: DC

## 2019-06-25 NOTE — Progress Notes (Signed)
Patient in for BP check. BP elevated. Per Dr Lajuana Ripple advised patient to increase Lisinopril to 20 mg daily.

## 2019-06-26 LAB — BASIC METABOLIC PANEL
BUN/Creatinine Ratio: 16 (ref 12–28)
BUN: 13 mg/dL (ref 8–27)
CO2: 25 mmol/L (ref 20–29)
Calcium: 9.7 mg/dL (ref 8.7–10.3)
Chloride: 97 mmol/L (ref 96–106)
Creatinine, Ser: 0.81 mg/dL (ref 0.57–1.00)
GFR calc Af Amer: 79 mL/min/{1.73_m2} (ref >59)
GFR calc non Af Amer: 68 mL/min/{1.73_m2} (ref >59)
Glucose: 102 mg/dL — ABNORMAL HIGH (ref 65–99)
Potassium: 4.6 mmol/L (ref 3.5–5.2)
Sodium: 139 mmol/L (ref 134–144)

## 2019-07-07 ENCOUNTER — Other Ambulatory Visit: Payer: Self-pay | Admitting: Physician Assistant

## 2019-07-18 ENCOUNTER — Ambulatory Visit (INDEPENDENT_AMBULATORY_CARE_PROVIDER_SITE_OTHER): Payer: Medicare Other | Admitting: *Deleted

## 2019-07-18 VITALS — Ht 64.0 in | Wt 145.9 lb

## 2019-07-18 DIAGNOSIS — Z0000 Encounter for general adult medical examination without abnormal findings: Secondary | ICD-10-CM | POA: Diagnosis not present

## 2019-07-18 NOTE — Progress Notes (Signed)
MEDICARE ANNUAL WELLNESS VISIT  07/18/2019  Telephone Visit Disclaimer This Medicare AWV was conducted by telephone due to national recommendations for restrictions regarding the COVID-19 Pandemic (e.g. social distancing).  I verified, using two identifiers, that I am speaking with [name] or their authorized healthcare agent. I discussed the limitations, risks, security, and privacy concerns of performing an evaluation and management service by telephone and the potential availability of an in-person appointment in the future. The patient expressed understanding and agreed to proceed.   Subjective:  [name] is a 84 y.o. female patient of Kelly Norlander, DO who had a Medicare Annual Wellness Visit today via telephone. [name] is Retired and lives alone. she has 0 children. she reports that she is socially active and does interact with friends/family regularly. she is minimally physically active and enjoys word search puzzles.  Patient Care Team: Kelly Norlander, DO as PCP - General (Family Medicine)  Advanced Directives 07/18/2019 03/15/2016 05/24/2015 05/21/2015  Does Patient Have a Medical Advance Directive? No No Yes Yes  Type of Advance Directive - Public librarian;Living will [place]  Does patient want to make changes to medical advance directive? - - No - Patient declined -  Copy of [place] in Chart? - - No - copy requested -  Would patient like information on creating a medical advance directive? Yes (MAU/Ambulatory/Procedural Areas - Information given) - - [facility] Utilization Over the Past 12 Months: # of hospitalizations or ER visits: 0 # of surgeries: 0  Review of Systems    Patient reports that her overall health is unchanged compared to last year.  History obtained from chart review and the patient General ROS: negative Cardiovascular ROS: positive for - Hypertension  Patient Reported  Readings (BP, Pulse, CBG, Weight, etc) CBG- 114 BP-150/60 P-53  Pain Assessment Pain : No/denies pain     Current Medications & Allergies (verified) Allergies as of 07/18/2019      Reactions   Codeine Nausea Only      Medication List       Accurate as of July 18, 2019  8:46 AM. If you have any questions, ask your nurse or doctor.        amLODipine 10 MG tablet Commonly known as: NORVASC Take 10 mg by mouth daily.   aspirin 81 MG tablet Take 81 mg by mouth daily.   atorvastatin 20 MG tablet Commonly known as: LIPITOR Take 20 mg by mouth daily.   calcium-vitamin D 500-200 MG-UNIT tablet Commonly known as: OSCAL WITH D Take 1 tablet by mouth.   fluticasone 50 MCG/ACT nasal spray Commonly known as: FLONASE Place into both nostrils daily.   glipiZIDE 10 MG tablet Commonly known as: GLUCOTROL Take 1 tablet (10 mg total) by mouth daily before breakfast.   glucose blood test strip Test BG up to 2 times daily Dx (E 11.65)   hydrochlorothiazide 25 MG tablet Commonly known as: HYDRODIURIL Take 1 tablet by mouth once daily   ibandronate 150 MG tablet Commonly known as: BONIVA Take 150 mg by mouth every 30 (thirty) days. Take in the morning with a full glass of water, on an empty stomach, and do not take anything else by mouth or lie down for the next 30 min.   insulin glargine 100 UNIT/ML injection Commonly known as: LANTUS Inject 16 Units into the skin at bedtime.   Janumet 50-1000 MG tablet Generic  drug: sitaGLIPtin-metformin Take 1 tablet by mouth 2 (two) times daily with a meal.   latanoprost 0.005 % ophthalmic solution Commonly known as: XALATAN 1 drop at bedtime.   lisinopril 10 MG tablet Commonly known as: ZESTRIL Take 2 tablets (20 mg total) by mouth daily.   metoprolol tartrate 50 MG tablet Commonly known as: LOPRESSOR Take 50 mg by mouth 2 (two) times daily.   multivitamin-iron-minerals-folic acid chewable tablet Chew 1 tablet by mouth  daily.   timolol 0.5 % ophthalmic solution Commonly known as: TIMOPTIC INSTILL 1 DROP INTO EACH EYE ONCE DAILY   Zegerid 20-1100 MG Caps capsule Generic drug: Omeprazole-Sodium Bicarbonate Take 1 capsule by mouth daily before breakfast.       History (reviewed): Past Medical History:  Diagnosis Date  . Arthritis    OA  . DDD (degenerative disc disease), lumbosacral   . Diabetes mellitus without complication ([place])   . Distal radius fracture    bilateral  . GERD (gastroesophageal reflux disease)   . Glaucoma   . HOH (hard of hearing)    left ear  . Hypertension   . PONV (postoperative nausea and vomiting)    Past Surgical History:  Procedure Laterality Date  . ABDOMINAL HYSTERECTOMY    . BREAST BIOPSY    . BREAST EXCISIONAL BIOPSY Left    benign  . BREAST SURGERY     left milk duct   . EYE SURGERY     cataract  . FRACTURE SURGERY Left    hip fracture  . OPEN REDUCTION INTERNAL FIXATION (ORIF) DISTAL RADIAL FRACTURE Bilateral 05/24/2015   Procedure: OPEN REDUCTION INTERNAL FIXATION (ORIF) BILATERAL DISTAL RADIAL FRACTURE VOLAR PLATE;  Surgeon: Daryll Brod, MD;  Location: [facility];  Service: Orthopedics;  Laterality: Bilateral;   Family History  Problem Relation Age of Onset  . Heart disease Mother   . Heart disease Father    Social History   Socioeconomic History  . Marital status: Widowed    Spouse name: Not on file  . Number of children: 0  . Years of education: 7  . Highest education level: 7th grade  Occupational History  . Not on file  Tobacco Use  . Smoking status: Never Smoker  . Smokeless tobacco: Never Used  Substance and Sexual Activity  . Alcohol use: No  . Drug use: No  . Sexual activity: Not Currently  Other Topics Concern  . Not on file  Social History Narrative  . Not on file   Social Determinants of Health   Financial Resource Strain: Low Risk   . Difficulty of Paying Living Expenses: Not hard at all  Food  Insecurity: No Food Insecurity  . Worried About Charity fundraiser in the Last Year: Never true  . Ran Out of Food in the Last Year: Never true  Transportation Needs: No Transportation Needs  . Lack of Transportation (Medical): No  . Lack of Transportation (Non-Medical): No  Physical Activity: Sufficiently Active  . Days of Exercise per Week: 5 days  . Minutes of Exercise per Session: 60 min  Stress: No Stress Concern Present  . Feeling of Stress : Not at all  Social Connections: Slightly Isolated  . Frequency of Communication with Friends and Family: More than three times a week  . Frequency of Social Gatherings with Friends and Family: More than three times a week  . Attends Religious Services: More than 4 times per year  . Active Member of Clubs or Organizations: Yes  .  Attends Archivist Meetings: More than 4 times per year  . Marital Status: Widowed    Activities of Daily Living In your present state of health, do you have any difficulty performing the following activities: 07/18/2019  Hearing? Y  Comment Wears Hearing Aids  Vision? N  Difficulty concentrating or making decisions? N  Walking or climbing stairs? N  Dressing or bathing? N  Doing errands, shopping? N  Preparing Food and eating ? N  Using the Toilet? N  In the past six months, have you accidently leaked urine? N  Do you have problems with loss of bowel control? N  Managing your Medications? N  Managing your Finances? N  Housekeeping or managing your Housekeeping? N  Some recent data might be hidden    Patient Education/ Literacy How often do you need to have someone help you when you read instructions, pamphlets, or other written materials from your doctor or pharmacy?: 1 - Never What is the last grade level you completed in school?: 7th Grade  Exercise Current Exercise Habits: Home exercise routine, Type of exercise: walking;Other - see comments(Stationary Bike), Time (Minutes): 60, Frequency  (Times/Week): 5, Weekly Exercise (Minutes/Week): 300, Intensity: Mild, Exercise limited by: None identified  Diet Patient reports consuming 3 meals a day and 1 snack(s) a day Patient reports that her primary diet is: Regular Patient reports that she does have regular access to food.   Depression Screen PHQ 2/9 Scores 07/18/2019 06/18/2019 05/19/2019 02/12/2019  PHQ - 2 Score 0 0 0 0  PHQ- 9 Score 0 0 0 0     Fall Risk Fall Risk  07/18/2019 06/18/2019 05/19/2019 02/12/2019  Falls in the past year? 0 0 0 0  Number falls in past yr: 0 - - -  Injury with Fall? 0 - - -  Risk for fall due to : No Fall Risks - - -  Follow up Falls evaluation completed - - -     Objective:  [name] seemed alert and oriented and she participated appropriately during our telephone visit.  Blood Pressure Weight BMI  BP Readings from Last 3 Encounters:  06/25/19 (!) 192/65  06/18/19 (!) 186/73  05/19/19 (!) 184/62   Wt Readings from Last 3 Encounters:  07/18/19 145 lb 15.1 oz (66.2 kg)  06/18/19 146 lb (66.2 kg)  05/19/19 143 lb (64.9 kg)   BMI Readings from Last 1 Encounters:  07/18/19 25.05 kg/m    *Unable to obtain current vital signs, weight, and BMI due to telephone visit type  Hearing/Vision  . Caressa did not seem to have difficulty with hearing/understanding during the telephone conversation . Reports that she has had a formal eye exam by an eye care professional within the past year . Reports that she has had a formal hearing evaluation within the past year *Unable to fully assess hearing and vision during telephone visit type  Cognitive Function: 6CIT Screen 07/18/2019  What Year? 0 points  What month? 0 points  What time? 0 points  Count back from 20 0 points  Months in reverse 0 points  Repeat phrase 0 points  Total Score 0   (Normal:0-7, Significant for Dysfunction: >8)  Normal Cognitive Function Screening: Yes   Immunization & Health Maintenance Record Immunization History    Administered Date(s) Administered  . Fluad Quad(high Dose 65+) 02/12/2019  . Influenza Split 02/07/2016, 02/08/2017  . Influenza, High Dose Seasonal PF 01/12/2015, 02/18/2018  . Influenza,inj,Quad PF,6+ Mos 02/20/2018  . Influenza,trivalent, recombinat,  inj, PF 02/17/2014, 02/08/2017  . Pneumococcal Conjugate-13 01/12/2015  . Pneumococcal Polysaccharide-23 06/12/2016  . Tdap 09/17/2017  . Zoster 01/12/2015    Health Maintenance  Topic Date Due  . FOOT EXAM  Never done  . OPHTHALMOLOGY EXAM  Never done  . HEMOGLOBIN A1C  11/16/2019  . TETANUS/TDAP  09/18/2027  . INFLUENZA VACCINE  Completed  . DEXA SCAN  Completed  . PNA vac Low Risk Adult  Completed       Assessment  This is a routine wellness examination for [name].  Health Maintenance: Due or Overdue Health Maintenance Due  Topic Date Due  . FOOT EXAM  Never done  . OPHTHALMOLOGY EXAM  Never done    [name] does not need a referral for Community Assistance: Care Management:   no Social Work:    no Prescription Assistance:  no Nutrition/Diabetes Education:  no   Plan:  Personalized Goals Goals Addressed            This Visit's Progress   .  Acknowledge receipt of Advanced Directive package       Look over and discuss Advance Directive packet and complete      Personalized Health Maintenance & Screening Recommendations  Diabetes screening Glaucoma screening Advanced directives: has NO advanced directive  - add't info requested. Referral to SW: no  Lung Cancer Screening Recommended: no (Low Dose CT Chest recommended if Age 97-80 years, 30 pack-year currently smoking OR have quit w/in past 15 years) Hepatitis C Screening recommended: no HIV Screening recommended: no  Advanced Directives: Written information was prepared per patient's request.  Referrals & Orders No orders of the defined types were placed in this encounter.   Follow-up Plan . Follow-up with Kelly Norlander, DO as  planned    I have personally reviewed and noted the following in the patient's chart:   . Medical and social history . Use of alcohol, tobacco or illicit drugs  . Current medications and supplements . Functional ability and status . Nutritional status . Physical activity . Advanced directives . List of other physicians . Hospitalizations, surgeries, and ER visits in previous 12 months . Vitals . Screenings to include cognitive, depression, and falls . Referrals and appointments  In addition, I have reviewed and discussed with [name] certain preventive protocols, quality metrics, and best practice recommendations. A written personalized care plan for preventive services as well as general preventive health recommendations is available and can be mailed to the patient at her request.      Wardell Heath, LPN  [id number]

## 2019-07-18 NOTE — Patient Instructions (Signed)
Ms. [name] , Thank you for taking time to come for your Medicare Wellness Visit. I appreciate your ongoing commitment to your health goals. Please review the following plan we discussed and let me know if I can assist you in the future.   These are the goals we discussed: Goals    .  Acknowledge receipt of Advanced Directive package     Look over and discuss Advance Directive packet and complete       This is a list of the screening recommended for you and due dates:  Health Maintenance  Topic Date Due  . Complete foot exam   Never done  . Eye exam for diabetics  Never done  . Hemoglobin A1C  11/16/2019  . Tetanus Vaccine  09/18/2027  . Flu Shot  Completed  . DEXA scan (bone density measurement)  Completed  . Pneumonia vaccines  Completed     Advance Directive  Advance directives are legal documents that let you make choices ahead of time about your health care and medical treatment in case you become unable to communicate for yourself. Advance directives are a way for you to make known your wishes to family, friends, and health care providers. This can let others know about your end-of-life care if you become unable to communicate. Discussing and writing advance directives should happen over time rather than all at once. Advance directives can be changed depending on your situation and what you want, even after you have signed the advance directives. There are different types of advance directives, such as:  Medical power of attorney.  Living will.  Do not resuscitate (DNR) or do not attempt resuscitation (DNAR) order. Health care proxy and medical power of attorney A health care proxy is also called a health care agent. This is a person who is appointed to make medical decisions for you in cases where you are unable to make the decisions yourself. Generally, people choose someone they know well and trust to represent their preferences. Make sure to ask this person for an  agreement to act as your proxy. A proxy may have to exercise judgment in the event of a medical decision for which your wishes are not known. A medical power of attorney is a legal document that names your health care proxy. Depending on the laws in your state, after the document is written, it may also need to be:  Signed.  Notarized.  Dated.  Copied.  Witnessed.  Incorporated into your medical record. You may also want to appoint someone to manage your money in a situation in which you are unable to do so. This is called a durable power of attorney for finances. It is a separate legal document from the durable power of attorney for health care. You may choose the same person or someone different from your health care proxy to act as your agent in money matters. If you do not appoint a proxy, or if there is a concern that the proxy is not acting in your best interests, a court may appoint a guardian to act on your behalf. Living will A living will is a set of instructions that state your wishes about medical care when you cannot express them yourself. Health care providers should keep a copy of your living will in your medical record. You may want to give a copy to family members or friends. To alert caregivers in case of an emergency, you can place a card in your wallet to let them  know that you have a living will and where they can find it. A living will is used if you become:  Terminally ill.  Disabled.  Unable to communicate or make decisions. Items to consider in your living will include:  To use or not to use life-support equipment, such as dialysis machines and breathing machines (ventilators).  A DNR or DNAR order. This tells health care providers not to use cardiopulmonary resuscitation (CPR) if breathing or heartbeat stops.  To use or not to use tube feeding.  To be given or not to be given food and fluids.  Comfort (palliative) care when the goal becomes comfort rather  than a cure.  Donation of organs and tissues. A living will does not give instructions for distributing your money and property if you should pass away. DNR or DNAR A DNR or DNAR order is a request not to have CPR in the event that your heart stops beating or you stop breathing. If a DNR or DNAR order has not been made and shared, a health care provider will try to help any patient whose heart has stopped or who has stopped breathing. If you plan to have surgery, talk with your health care provider about how your DNR or DNAR order will be followed if problems occur. What if I do not have an advance directive? If you do not have an advance directive, some states assign family decision makers to act on your behalf based on how closely you are related to them. Each state has its own laws about advance directives. You may want to check with your health care provider, attorney, or state representative about the laws in your state. Summary  Advance directives are the legal documents that allow you to make choices ahead of time about your health care and medical treatment in case you become unable to tell others about your care.  The process of discussing and writing advance directives should happen over time. You can change the advance directives, even after you have signed them.  Advance directives include DNR or DNAR orders, living wills, and designating an agent as your medical power of attorney. This information is not intended to replace advice given to you by your health care provider. Make sure you discuss any questions you have with your health care provider. Document Revised: 11/21/2018 Document Reviewed: 11/21/2018 Elsevier Patient Education  [place].

## 2019-09-01 ENCOUNTER — Other Ambulatory Visit: Payer: Self-pay

## 2019-09-01 ENCOUNTER — Ambulatory Visit (INDEPENDENT_AMBULATORY_CARE_PROVIDER_SITE_OTHER): Payer: Medicare Other | Admitting: Family Medicine

## 2019-09-01 ENCOUNTER — Encounter: Payer: Self-pay | Admitting: Family Medicine

## 2019-09-01 VITALS — BP 174/63 | HR 62 | Temp 97.8°F | Ht 64.0 in | Wt 142.0 lb

## 2019-09-01 DIAGNOSIS — E785 Hyperlipidemia, unspecified: Secondary | ICD-10-CM

## 2019-09-01 DIAGNOSIS — E1165 Type 2 diabetes mellitus with hyperglycemia: Secondary | ICD-10-CM

## 2019-09-01 DIAGNOSIS — E1159 Type 2 diabetes mellitus with other circulatory complications: Secondary | ICD-10-CM

## 2019-09-01 DIAGNOSIS — I10 Essential (primary) hypertension: Secondary | ICD-10-CM

## 2019-09-01 DIAGNOSIS — E1169 Type 2 diabetes mellitus with other specified complication: Secondary | ICD-10-CM | POA: Diagnosis not present

## 2019-09-01 DIAGNOSIS — R809 Proteinuria, unspecified: Secondary | ICD-10-CM

## 2019-09-01 DIAGNOSIS — E1129 Type 2 diabetes mellitus with other diabetic kidney complication: Secondary | ICD-10-CM | POA: Diagnosis not present

## 2019-09-01 LAB — CMP14+EGFR
ALT: 16 IU/L (ref 0–32)
AST: 19 IU/L (ref 0–40)
Albumin/Globulin Ratio: 1.5 (ref 1.2–2.2)
Albumin: 4.2 g/dL (ref 3.6–4.6)
Alkaline Phosphatase: 70 IU/L (ref 39–117)
BUN/Creatinine Ratio: 16 (ref 12–28)
BUN: 13 mg/dL (ref 8–27)
Bilirubin Total: 0.3 mg/dL (ref 0.0–1.2)
CO2: 23 mmol/L (ref 20–29)
Calcium: 9.7 mg/dL (ref 8.7–10.3)
Chloride: 97 mmol/L (ref 96–106)
Creatinine, Ser: 0.8 mg/dL (ref 0.57–1.00)
GFR calc Af Amer: 80 mL/min/{1.73_m2} (ref >59)
GFR calc non Af Amer: 69 mL/min/{1.73_m2} (ref >59)
Globulin, Total: 2.8 g/dL (ref 1.5–4.5)
Glucose: 116 mg/dL — ABNORMAL HIGH (ref 65–99)
Potassium: 4.7 mmol/L (ref 3.5–5.2)
Sodium: 138 mmol/L (ref 134–144)
Total Protein: 7 g/dL (ref 6.0–8.5)

## 2019-09-01 LAB — BAYER DCA HB A1C WAIVED: HB A1C (BAYER DCA - WAIVED): 7.6 % — ABNORMAL HIGH (ref <7.0)

## 2019-09-01 MED ORDER — JANUMET 50-1000 MG PO TABS: 1.0000 | ORAL_TABLET | Freq: Two times a day (BID) | ORAL | 2 refills | Status: DC

## 2019-09-01 MED ORDER — LISINOPRIL 30 MG PO TABS: 30.0000 mg | ORAL_TABLET | Freq: Every day | ORAL | 0 refills | Status: DC

## 2019-09-01 MED ORDER — ATORVASTATIN CALCIUM 20 MG PO TABS: 20.0000 mg | ORAL_TABLET | Freq: Every day | ORAL | 3 refills | Status: DC

## 2019-09-01 MED ORDER — METOPROLOL TARTRATE 50 MG PO TABS: 50.0000 mg | ORAL_TABLET | Freq: Two times a day (BID) | ORAL | 3 refills | Status: DC

## 2019-09-01 MED ORDER — AMLODIPINE BESYLATE 10 MG PO TABS: 10.0000 mg | ORAL_TABLET | Freq: Every day | ORAL | 3 refills | Status: DC

## 2019-09-01 MED ORDER — GLIPIZIDE 10 MG PO TABS: 10.0000 mg | ORAL_TABLET | Freq: Every day | ORAL | 1 refills | Status: DC

## 2019-09-01 NOTE — Progress Notes (Signed)
Subjective: CC: f/u uncontrolled DM2 HPI: [name] is a 84 y.o. female presenting to clinic today for:  1. Type 2 Diabetes w/ HTN and HLD w/ microalbuminuria:  Patient reports onset of type 2 diabetes about 10 to 15 years ago.  She has been on insulin since 2018. Patient reports: Glucometer: One Touch ultra. FBG 102-140.  High: 180. Low: 102. Janumet 50-1093m added last visit for uncontrolled BGs. She was continued on Glucotrol 10 mg daily and 16 units of Lantus subq nightly, atorvastatin 20 mg daily, Norvasc 10 mg daily and hydrochlorothiazide 12.5 mg daily. Lisinopril 277mdaily added last visit for uncontrolled BGs.  Unfortunately did not get her BMP after starting medication so will collect today. She is tolerating the new medications without difficulty.  Home BPs 142-153/60's.  Last eye exam: had w/ Dr LeMarin Comment020 Last foot exam: need  Last A1c:  Lab Results  Component Value Date   HGBA1C 9.0 (H) 05/19/2019  Nephropathy screen indicated?: now on ACE-I  Last flu, zoster and/or pneumovax:  UTD Immunization History  Administered Date(s) Administered  . Fluad Quad(high Dose 65+) 02/12/2019  . Influenza Split 02/07/2016, 02/08/2017  . Influenza, High Dose Seasonal PF 01/12/2015, 02/18/2018  . Influenza,inj,Quad PF,6+ Mos 02/20/2018  . Influenza,trivalent, recombinat, inj, PF 02/17/2014, 02/08/2017  . Pneumococcal Conjugate-13 01/12/2015  . Pneumococcal Polysaccharide-23 06/12/2016  . Tdap 09/17/2017  . Zoster 01/12/2015    ROS: Denies dizziness, LOC, polyuria, polydipsia, unintended weight loss/gain, foot ulcerations, numbness or tingling in extremities, shortness of breath or chest pain.    Past Medical History:  Diagnosis Date  . Arthritis    OA  . DDD (degenerative disc disease), lumbosacral   . Diabetes mellitus without complication ([facility]  . Distal radius fracture    bilateral  . GERD (gastroesophageal reflux disease)   . Glaucoma   . HOH (hard of hearing)     left ear  . Hypertension   . PONV (postoperative nausea and vomiting)    Past Surgical History:  Procedure Laterality Date  . ABDOMINAL HYSTERECTOMY    . BREAST BIOPSY    . BREAST EXCISIONAL BIOPSY Left    benign  . BREAST SURGERY     left milk duct   . EYE SURGERY     cataract  . FRACTURE SURGERY Left    hip fracture  . OPEN REDUCTION INTERNAL FIXATION (ORIF) DISTAL RADIAL FRACTURE Bilateral 05/24/2015   Procedure: OPEN REDUCTION INTERNAL FIXATION (ORIF) BILATERAL DISTAL RADIAL FRACTURE VOLAR PLATE;  Surgeon: GaDaryll BrodMD;  [facility]: Orthopedics;  Laterality: Bilateral;   Social History   Socioeconomic History  . Marital status: Widowed    Spouse name: Not on file  . Number of children: 0  . Years of education: 7  . Highest education level: 7th grade  Occupational History  . Not on file  Tobacco Use  . Smoking status: Never Smoker  . Smokeless tobacco: Never Used  Substance and Sexual Activity  . Alcohol use: No  . Drug use: No  . Sexual activity: Not Currently  Other Topics Concern  . Not on file  Social History Narrative  . Not on file   Social Determinants of Health   Financial Resource Strain: Low Risk   . Difficulty of Paying Living Expenses: Not hard at all  Food Insecurity: No Food Insecurity  . Worried About RuCharity fundraisern the Last Year: Never true  . Ran Out of Food  in the Last Year: Never true  Transportation Needs: No Transportation Needs  . Lack of Transportation (Medical): No  . Lack of Transportation (Non-Medical): No  Physical Activity: Sufficiently Active  . Days of Exercise per Week: 5 days  . Minutes of Exercise per Session: 60 min  Stress: No Stress Concern Present  . Feeling of Stress : Not at all  Social Connections: Slightly Isolated  . Frequency of Communication with Friends and Family: More than three times a week  . Frequency of Social Gatherings with Friends and Family: More than three  times a week  . Attends Religious Services: More than 4 times per year  . Active Member of Clubs or Organizations: Yes  . Attends Archivist Meetings: More than 4 times per year  . Marital Status: Widowed  Intimate Partner Violence: Not At Risk  . Fear of Current or Ex-Partner: No  . Emotionally Abused: No  . Physically Abused: No  . Sexually Abused: No   No outpatient medications have been marked as taking for the 09/01/19 encounter (Appointment) with Janora Norlander, DO.   Family History  Problem Relation Age of Onset  . Heart disease Mother   . Heart disease Father    Allergies  Allergen Reactions  . Codeine Nausea Only    ROS: Per HPI  Objective: Office vital signs reviewed. BP (!) 174/63   Pulse 62   Temp 97.8 F (36.6 C)   Ht 5' 4"  (1.626 m)   Wt 142 lb (64.4 kg)   SpO2 93%   BMI 24.37 kg/m   Physical Examination:  General: Awake, alert, well nourished, No acute distress HEENT: Normal, sclera white, MMM Cardio: regular rate and rhythm, S1S2 heard, no murmurs appreciated Pulm: clear to auscultation bilaterally, no wheezes, rhonchi or rales; normal work of breathing on room air Extremities: warm, well perfused, No edema, cyanosis or clubbing; +2 pulses bilaterally Neuro: no focal neurologic deficits; see DM foot Diabetic Foot Exam - Simple   Simple Foot Form Diabetic Foot exam was performed with the following findings: Yes 09/01/2019  8:22 AM  Visual Inspection See comments: Yes Sensation Testing Intact to touch and monofilament testing bilaterally: Yes Pulse Check Posterior Tibialis and Dorsalis pulse intact bilaterally: Yes Comments Decreased vibratory sensation bilaterally.  Mild valgus deformity of MTP bilaterally.  No ulceration/ preulcerative callus.     Assessment/ Plan: 84 y.o. female   1. Uncontrolled type 2 diabetes mellitus with hyperglycemia [facility]) Given age, goal A1c is around 7-1/2 but would allow to 8 in this patient.  She  greatly reduced her A1c to 7.6 today from 9 at last visit.  Continue current regimen.  No changes - Bayer DCA Hb A1c Waived  2. Hypertension associated with diabetes ([place]) Not controlled here in office but blood pressures are mostly controlled at home with a few outliers in the 616W of the systolics.  Of asked that she bring her blood pressure monitor to the office to have it checked against ours.  In the meantime I am going to increase her lisinopril to 30 mg that she had more often than not blood pressures in the 737T to 062I systolics at home.  She is tolerating the medicine without difficulty.  We will check her renal function test today. - CMP14+EGFR - lisinopril (ZESTRIL) 30 MG tablet; Take 1 tablet (30 mg total) by mouth daily.  Dispense: 90 tablet; Refill: 0  3. Microalbuminuria due to type 2 diabetes mellitus ([place]) Continue ACE as above -  CMP14+EGFR - lisinopril (ZESTRIL) 30 MG tablet; Take 1 tablet (30 mg total) by mouth daily.  Dispense: 90 tablet; Refill: 0  4. Hyperlipidemia associated with type 2 diabetes mellitus ([place]) Check fasting lipid panel - CMP14+EGFR  Orders Placed This Encounter  Procedures  . CMP14+EGFR  . Bayer DCA Hb A1c Waived  . Bayer DCA Hb A1c Waived    Standing Status:   Future    Standing Expiration Date:   08/31/2020  . Basic metabolic panel    Standing Status:   Future    Standing Expiration Date:   08/31/2020   Meds ordered this encounter  Medications  . lisinopril (ZESTRIL) 30 MG tablet    Sig: Take 1 tablet (30 mg total) by mouth daily.    Dispense:  90 tablet    Refill:  0  . glipiZIDE (GLUCOTROL) 10 MG tablet    Sig: Take 1 tablet (10 mg total) by mouth daily before breakfast.    Dispense:  90 tablet    Refill:  1  . sitaGLIPtin-metformin (JANUMET) 50-1000 MG tablet    Sig: Take 1 tablet by mouth 2 (two) times daily with a meal.    Dispense:  60 tablet    Refill:  2  . amLODipine (NORVASC) 10 MG tablet    Sig: Take 1 tablet (10 mg total)  by mouth daily.    Dispense:  90 tablet    Refill:  3  . atorvastatin (LIPITOR) 20 MG tablet    Sig: Take 1 tablet (20 mg total) by mouth daily.    Dispense:  90 tablet    Refill:  3  . metoprolol tartrate (LOPRESSOR) 50 MG tablet    Sig: Take 1 tablet (50 mg total) by mouth 2 (two) times daily.    Dispense:  180 tablet    Refill:  [place], [place] [phone]

## 2019-09-01 NOTE — Patient Instructions (Addendum)
Sugar has gotten A LOT better.  Your A1c down to 7.7 today.  No changes.  Continue current medication.  I'm going to increase your lisinopril one more time to 30mg .  The new pill has been sent.  TAKE ONE OF THE LISINOPRIL 30mg  ONCE DAILY.   Bring your monitor in for blood pressure check with the nurse in 2 weeks.  See me back in 3 months.

## 2019-09-09 ENCOUNTER — Other Ambulatory Visit: Payer: Self-pay | Admitting: Family Medicine

## 2019-09-09 DIAGNOSIS — I10 Essential (primary) hypertension: Secondary | ICD-10-CM

## 2019-09-09 DIAGNOSIS — E1129 Type 2 diabetes mellitus with other diabetic kidney complication: Secondary | ICD-10-CM

## 2019-09-09 DIAGNOSIS — E1159 Type 2 diabetes mellitus with other circulatory complications: Secondary | ICD-10-CM

## 2019-09-29 ENCOUNTER — Ambulatory Visit (INDEPENDENT_AMBULATORY_CARE_PROVIDER_SITE_OTHER): Payer: Medicare Other | Admitting: Family Medicine

## 2019-09-29 ENCOUNTER — Encounter: Payer: Self-pay | Admitting: Family Medicine

## 2019-09-29 DIAGNOSIS — N3000 Acute cystitis without hematuria: Secondary | ICD-10-CM | POA: Diagnosis not present

## 2019-09-29 LAB — URINALYSIS, COMPLETE
Bilirubin, UA: NEGATIVE
Glucose, UA: NEGATIVE
Ketones, UA: NEGATIVE
Nitrite, UA: NEGATIVE
Specific Gravity, UA: 1.02 (ref 1.005–1.030)
Urobilinogen, Ur: 0.2 mg/dL (ref 0.2–1.0)
pH, UA: 8.5 — ABNORMAL HIGH (ref 5.0–7.5)

## 2019-09-29 LAB — MICROSCOPIC EXAMINATION
Renal Epithel, UA: NONE SEEN /hpf
WBC, UA: >30 /hpf — AB (ref 0–5)

## 2019-09-29 MED ORDER — CEPHALEXIN 500 MG PO CAPS: 500.0000 mg | ORAL_CAPSULE | Freq: Four times a day (QID) | ORAL | 0 refills | Status: DC

## 2019-09-29 NOTE — Progress Notes (Signed)
Virtual Visit via telephone Note  I connected with [name] on 09/29/19 at 0828 by telephone and verified that I am speaking with the correct person using two identifiers. [name] is currently located at home and no other people are currently with her during visit. The provider, Fransisca Kaufmann Shanya Ferriss, MD is located in their office at time of visit.  Call ended at [phone]  I discussed the limitations, risks, security and privacy concerns of performing an evaluation and management service by telephone and the availability of in person appointments. I also discussed with the patient that there may be a patient responsible charge related to this service. The patient expressed understanding and agreed to proceed.   History and Present Illness: Patient is having burning with urination and its been going on for 2-3 days.  She feels like UTI.  She says blood sugar is 122 today this morning. BP 142/62, hr 55. She denies any fevers or chills or kidney pain.  1. Acute cystitis without hematuria     Outpatient Encounter Medications as of 09/29/2019  Medication Sig  . amLODipine (NORVASC) 10 MG tablet Take 1 tablet (10 mg total) by mouth daily.  Marland Kitchen aspirin 81 MG tablet Take 81 mg by mouth daily.  Marland Kitchen atorvastatin (LIPITOR) 20 MG tablet Take 1 tablet (20 mg total) by mouth daily.  . calcium-vitamin D (OSCAL WITH D) 500-200 MG-UNIT tablet Take 1 tablet by mouth.  . cephALEXin (KEFLEX) 500 MG capsule Take 1 capsule (500 mg total) by mouth 4 (four) times daily.  . fluticasone (FLONASE) 50 MCG/ACT nasal spray Place into both nostrils daily.  Marland Kitchen glipiZIDE (GLUCOTROL) 10 MG tablet Take 1 tablet (10 mg total) by mouth daily before breakfast.  . glucose blood test strip Test BG up to 2 times daily Dx (E 11.65)  . hydrochlorothiazide (HYDRODIURIL) 25 MG tablet Take 1 tablet by mouth once daily  . ibandronate (BONIVA) 150 MG tablet Take 150 mg by mouth every 30 (thirty) days. Take in the morning with a full  glass of water, on an empty stomach, and do not take anything else by mouth or lie down for the next 30 min.  . insulin glargine (LANTUS) 100 UNIT/ML injection Inject 16 Units into the skin at bedtime.   Marland Kitchen latanoprost (XALATAN) 0.005 % ophthalmic solution 1 drop at bedtime.  Marland Kitchen lisinopril (ZESTRIL) 30 MG tablet Take 1 tablet (30 mg total) by mouth daily.  . metoprolol tartrate (LOPRESSOR) 50 MG tablet Take 1 tablet (50 mg total) by mouth 2 (two) times daily.  . multivitamin-iron-minerals-folic acid (CENTRUM) chewable tablet Chew 1 tablet by mouth daily.  Earney Navy Bicarbonate (ZEGERID) 20-1100 MG CAPS capsule Take 1 capsule by mouth daily before breakfast.  . sitaGLIPtin-metformin (JANUMET) 50-1000 MG tablet Take 1 tablet by mouth 2 (two) times daily with a meal.  . timolol (TIMOPTIC) 0.5 % ophthalmic solution INSTILL 1 DROP INTO EACH EYE ONCE DAILY   No facility-administered encounter medications on file as of 09/29/2019.    Review of Systems  Constitutional: Negative for chills and fever.  Eyes: Negative for visual disturbance.  Respiratory: Negative for chest tightness and shortness of breath.   Cardiovascular: Negative for chest pain and leg swelling.  Gastrointestinal: Negative for abdominal pain.  Genitourinary: Positive for dysuria, frequency and urgency. Negative for difficulty urinating, hematuria, vaginal bleeding, vaginal discharge and vaginal pain.  Musculoskeletal: Negative for back pain and gait problem.  Skin: Negative for rash.  Neurological: Negative for light-headedness  and headaches.  Psychiatric/Behavioral: Negative for agitation and behavioral problems.  All other systems reviewed and are negative.   Observations/Objective: Patient sounds comfortable and in no acute distress  Assessment and Plan: Problem List Items Addressed This Visit    None    Visit Diagnoses    Acute cystitis without hematuria    -  Primary   Relevant Medications   cephALEXin  (KEFLEX) 500 MG capsule   Other Relevant Orders   Urine Culture   Urinalysis, Complete      Will treat with keflex and watch for culture Follow up plan: Return if symptoms worsen or fail to improve.     I discussed the assessment and treatment plan with the patient. The patient was provided an opportunity to ask questions and all were answered. The patient agreed with the plan and demonstrated an understanding of the instructions.   The patient was advised to call back or seek an in-person evaluation if the symptoms worsen or if the condition fails to improve as anticipated.  The above assessment and management plan was discussed with the patient. The patient verbalized understanding of and has agreed to the management plan. Patient is aware to call the clinic if symptoms persist or worsen. Patient is aware when to return to the clinic for a follow-up visit. Patient educated on when it is appropriate to go to the emergency department.    I provided 10 minutes of non-face-to-face time during this encounter.    Worthy Rancher, MD

## 2019-10-02 LAB — URINE CULTURE

## 2019-11-16 DIAGNOSIS — H5213 Myopia, bilateral: Secondary | ICD-10-CM | POA: Diagnosis not present

## 2019-11-30 ENCOUNTER — Other Ambulatory Visit: Payer: Self-pay | Admitting: Family Medicine

## 2019-11-30 DIAGNOSIS — I152 Hypertension secondary to endocrine disorders: Secondary | ICD-10-CM

## 2019-11-30 DIAGNOSIS — E1129 Type 2 diabetes mellitus with other diabetic kidney complication: Secondary | ICD-10-CM

## 2019-12-10 ENCOUNTER — Other Ambulatory Visit: Payer: Self-pay

## 2019-12-10 ENCOUNTER — Inpatient Hospital Stay (HOSPITAL_COMMUNITY)
Admission: EM | Admit: 2019-12-10 | Discharge: 2019-12-12 | DRG: 305 | Disposition: A | Discharge status: Home / Self Care | Payer: Medicare Other | Attending: Family Medicine | Admitting: Family Medicine

## 2019-12-10 ENCOUNTER — Encounter (HOSPITAL_COMMUNITY): Payer: Self-pay | Admitting: Emergency Medicine

## 2019-12-10 ENCOUNTER — Emergency Department (HOSPITAL_COMMUNITY): Payer: Medicare Other

## 2019-12-10 DIAGNOSIS — I5032 Chronic diastolic (congestive) heart failure: Secondary | ICD-10-CM

## 2019-12-10 DIAGNOSIS — I110 Hypertensive heart disease with heart failure: Secondary | ICD-10-CM | POA: Diagnosis present

## 2019-12-10 DIAGNOSIS — J8410 Pulmonary fibrosis, unspecified: Secondary | ICD-10-CM | POA: Diagnosis not present

## 2019-12-10 DIAGNOSIS — Z79899 Other long term (current) drug therapy: Secondary | ICD-10-CM

## 2019-12-10 DIAGNOSIS — H409 Unspecified glaucoma: Secondary | ICD-10-CM | POA: Diagnosis present

## 2019-12-10 DIAGNOSIS — I509 Heart failure, unspecified: Secondary | ICD-10-CM

## 2019-12-10 DIAGNOSIS — I342 Nonrheumatic mitral (valve) stenosis: Secondary | ICD-10-CM | POA: Diagnosis not present

## 2019-12-10 DIAGNOSIS — R809 Proteinuria, unspecified: Secondary | ICD-10-CM

## 2019-12-10 DIAGNOSIS — K219 Gastro-esophageal reflux disease without esophagitis: Secondary | ICD-10-CM | POA: Diagnosis not present

## 2019-12-10 DIAGNOSIS — Z20822 Contact with and (suspected) exposure to covid-19: Secondary | ICD-10-CM | POA: Diagnosis present

## 2019-12-10 DIAGNOSIS — E1169 Type 2 diabetes mellitus with other specified complication: Secondary | ICD-10-CM | POA: Diagnosis not present

## 2019-12-10 DIAGNOSIS — I160 Hypertensive urgency: Secondary | ICD-10-CM | POA: Diagnosis not present

## 2019-12-10 DIAGNOSIS — H9190 Unspecified hearing loss, unspecified ear: Secondary | ICD-10-CM | POA: Diagnosis not present

## 2019-12-10 DIAGNOSIS — R0602 Shortness of breath: Secondary | ICD-10-CM | POA: Diagnosis not present

## 2019-12-10 DIAGNOSIS — I340 Nonrheumatic mitral (valve) insufficiency: Secondary | ICD-10-CM | POA: Diagnosis not present

## 2019-12-10 DIAGNOSIS — I161 Hypertensive emergency: Secondary | ICD-10-CM

## 2019-12-10 DIAGNOSIS — E119 Type 2 diabetes mellitus without complications: Secondary | ICD-10-CM

## 2019-12-10 DIAGNOSIS — Z7982 Long term (current) use of aspirin: Secondary | ICD-10-CM | POA: Diagnosis not present

## 2019-12-10 DIAGNOSIS — Z794 Long term (current) use of insulin: Secondary | ICD-10-CM | POA: Diagnosis not present

## 2019-12-10 DIAGNOSIS — I152 Hypertension secondary to endocrine disorders: Secondary | ICD-10-CM

## 2019-12-10 DIAGNOSIS — E1129 Type 2 diabetes mellitus with other diabetic kidney complication: Secondary | ICD-10-CM

## 2019-12-10 DIAGNOSIS — I10 Essential (primary) hypertension: Secondary | ICD-10-CM | POA: Diagnosis not present

## 2019-12-10 DIAGNOSIS — E785 Hyperlipidemia, unspecified: Secondary | ICD-10-CM | POA: Diagnosis present

## 2019-12-10 DIAGNOSIS — R9389 Abnormal findings on diagnostic imaging of other specified body structures: Secondary | ICD-10-CM | POA: Diagnosis present

## 2019-12-10 DIAGNOSIS — J90 Pleural effusion, not elsewhere classified: Secondary | ICD-10-CM | POA: Diagnosis not present

## 2019-12-10 DIAGNOSIS — R9431 Abnormal electrocardiogram [ECG] [EKG]: Secondary | ICD-10-CM | POA: Diagnosis not present

## 2019-12-10 DIAGNOSIS — Z90710 Acquired absence of both cervix and uterus: Secondary | ICD-10-CM

## 2019-12-10 DIAGNOSIS — E1159 Type 2 diabetes mellitus with other circulatory complications: Secondary | ICD-10-CM

## 2019-12-10 LAB — RESPIRATORY PANEL BY RT PCR (FLU A&B, COVID)
Influenza A by PCR: NEGATIVE
Influenza B by PCR: NEGATIVE
SARS Coronavirus 2 by RT PCR: NEGATIVE

## 2019-12-10 LAB — BRAIN NATRIURETIC PEPTIDE: B Natriuretic Peptide: 379 pg/mL — ABNORMAL HIGH (ref 0.0–100.0)

## 2019-12-10 LAB — CBC
HCT: 39.1 % (ref 36.0–46.0)
Hemoglobin: 12.2 g/dL (ref 12.0–15.0)
MCH: 26.6 pg (ref 26.0–34.0)
MCHC: 31.2 g/dL (ref 30.0–36.0)
MCV: 85.2 fL (ref 80.0–100.0)
Platelets: 452 10*3/uL — ABNORMAL HIGH (ref 150–400)
RBC: 4.59 MIL/uL (ref 3.87–5.11)
RDW: 15.4 % (ref 11.5–15.5)
WBC: 16.4 10*3/uL — ABNORMAL HIGH (ref 4.0–10.5)
nRBC: 0 % (ref 0.0–0.2)

## 2019-12-10 LAB — COMPREHENSIVE METABOLIC PANEL
ALT: 18 U/L (ref 0–44)
AST: 19 U/L (ref 15–41)
Albumin: 3.7 g/dL (ref 3.5–5.0)
Alkaline Phosphatase: 54 U/L (ref 38–126)
Anion gap: 13 (ref 5–15)
BUN: 16 mg/dL (ref 8–23)
CO2: 21 mmol/L — ABNORMAL LOW (ref 22–32)
Calcium: 8.5 mg/dL — ABNORMAL LOW (ref 8.9–10.3)
Chloride: 97 mmol/L — ABNORMAL LOW (ref 98–111)
Creatinine, Ser: 0.69 mg/dL (ref 0.44–1.00)
GFR calc Af Amer: >60 mL/min (ref >60)
GFR calc non Af Amer: >60 mL/min (ref >60)
Glucose, Bld: 178 mg/dL — ABNORMAL HIGH (ref 70–99)
Potassium: 3.6 mmol/L (ref 3.5–5.1)
Sodium: 131 mmol/L — ABNORMAL LOW (ref 135–145)
Total Bilirubin: 0.6 mg/dL (ref 0.3–1.2)
Total Protein: 7.3 g/dL (ref 6.5–8.1)

## 2019-12-10 LAB — TROPONIN I (HIGH SENSITIVITY): Troponin I (High Sensitivity): 10 ng/L (ref <18)

## 2019-12-10 MED ORDER — ASPIRIN 81 MG PO CHEW
324.0000 mg | CHEWABLE_TABLET | Freq: Once | ORAL | Status: AC
  Administered 2019-12-10: 324 mg via ORAL
  Filled 2019-12-10: qty 4

## 2019-12-10 MED ORDER — FUROSEMIDE 10 MG/ML IJ SOLN
20.0000 mg | Freq: Once | INTRAMUSCULAR | Status: AC
  Administered 2019-12-10: 20 mg via INTRAVENOUS
  Filled 2019-12-10: qty 2

## 2019-12-10 NOTE — ED Provider Notes (Signed)
[facility] EMERGENCY DEPARTMENT Provider Note   CSN: [id number] Arrival date & time: 12/10/19  2213     History Chief Complaint  Patient presents with  . Shortness of Breath    [name] is a 84 y.o. female.  Patient with history of hypertension, diabetes, acid reflux disease here with shortness of breath worse with lying down for the past 2 nights.  Patient states she does not have any problems during the day when she tries to lie down she feels short of breath and feels like she is being smothered.  She has tried propping herself up on pillows without relief.  Denies any chest pain, cough, fever, nausea or vomiting.  No leg pain or leg swelling.  She denies any history of heart failure, CAD, asthma or COPD.  She does not smoke.  No history of previous lung issues. Denies any pain in her legs or swelling in her legs.  Denies any change in her weight.  States she has no issues breathing when she is sitting up but it is worse as soon as she tries to lie down.  The history is provided by the patient.  Shortness of Breath Associated symptoms: no abdominal pain, no chest pain, no cough, no fever, no headaches and no rash        Past Medical History:  Diagnosis Date  . Arthritis    OA  . DDD (degenerative disc disease), lumbosacral   . Diabetes mellitus without complication ([place])   . Distal radius fracture    bilateral  . GERD (gastroesophageal reflux disease)   . Glaucoma   . HOH (hard of hearing)    left ear  . Hypertension   . PONV (postoperative nausea and vomiting)     Patient Active Problem List   Diagnosis Date Noted  . Uncontrolled type 2 diabetes mellitus with hyperglycemia ([place]) 09/01/2019  . Hypertension associated with diabetes ([place]) 09/01/2019  . Hyperlipidemia associated with type 2 diabetes mellitus ([place]) 09/01/2019  . Microalbuminuria due to type 2 diabetes mellitus ([place]) 06/18/2019    Past Surgical History:  Procedure Laterality Date  . ABDOMINAL  HYSTERECTOMY    . BREAST BIOPSY    . BREAST EXCISIONAL BIOPSY Left    benign  . BREAST SURGERY     left milk duct   . EYE SURGERY     cataract  . FRACTURE SURGERY Left    hip fracture  . OPEN REDUCTION INTERNAL FIXATION (ORIF) DISTAL RADIAL FRACTURE Bilateral 05/24/2015   Procedure: OPEN REDUCTION INTERNAL FIXATION (ORIF) BILATERAL DISTAL RADIAL FRACTURE VOLAR PLATE;  Surgeon: Daryll Brod, MD;  Location: [facility];  Service: Orthopedics;  Laterality: Bilateral;     OB History   No obstetric history on file.     Family History  Problem Relation Age of Onset  . Heart disease Mother   . Heart disease Father     Social History   Tobacco Use  . Smoking status: Never Smoker  . Smokeless tobacco: Never Used  Vaping Use  . Vaping Use: Never used  Substance Use Topics  . Alcohol use: No  . Drug use: No    Home Medications Prior to Admission medications   Medication Sig Start Date End Date Taking? Authorizing Provider  amLODipine (NORVASC) 10 MG tablet Take 1 tablet (10 mg total) by mouth daily. 09/01/19   Janora Norlander, DO  aspirin 81 MG tablet Take 81 mg by mouth daily.    [provider]  atorvastatin (LIPITOR) 20 MG tablet Take 1 tablet (20 mg total) by mouth daily. 09/01/19   Janora Norlander, DO  calcium-vitamin D (OSCAL WITH D) 500-200 MG-UNIT tablet Take 1 tablet by mouth.    [provider]  cephALEXin (KEFLEX) 500 MG capsule Take 1 capsule (500 mg total) by mouth 4 (four) times daily. 09/29/19   Dettinger, Fransisca Kaufmann, MD  fluticasone (FLONASE) 50 MCG/ACT nasal spray Place into both nostrils daily.    [provider]  glipiZIDE (GLUCOTROL) 10 MG tablet Take 1 tablet (10 mg total) by mouth daily before breakfast. 09/01/19   [name] M, DO  glucose blood test strip Test BG up to 2 times daily Dx (E 11.65) 02/12/19   [name] M, DO  hydrochlorothiazide (HYDRODIURIL) 25 MG tablet Take 1 tablet by mouth once daily  07/07/19   [name] M, DO  ibandronate (BONIVA) 150 MG tablet Take 150 mg by mouth every 30 (thirty) days. Take in the morning with a full glass of water, on an empty stomach, and do not take anything else by mouth or lie down for the next 30 min.    [provider]  insulin glargine (LANTUS) 100 UNIT/ML injection Inject 16 Units into the skin at bedtime.     [provider]  latanoprost (XALATAN) 0.005 % ophthalmic solution 1 drop at bedtime.    [provider]  lisinopril (ZESTRIL) 30 MG tablet Take 1 tablet by mouth once daily 12/01/19   [name] M, DO  metoprolol tartrate (LOPRESSOR) 50 MG tablet Take 1 tablet (50 mg total) by mouth 2 (two) times daily. 09/01/19   Janora Norlander, DO  multivitamin-iron-minerals-folic acid (CENTRUM) chewable tablet Chew 1 tablet by mouth daily.    [provider]  Omeprazole-Sodium Bicarbonate (ZEGERID) 20-1100 MG CAPS capsule Take 1 capsule by mouth daily before breakfast.    [provider]  sitaGLIPtin-metformin (JANUMET) 50-1000 MG tablet Take 1 tablet by mouth 2 (two) times daily with a meal. 09/01/19   Gottschalk, Ashly M, DO  timolol (TIMOPTIC) 0.5 % ophthalmic solution INSTILL 1 DROP INTO EACH EYE ONCE DAILY 01/27/19   [provider]    Allergies    Codeine  Review of Systems   Review of Systems  Constitutional: Negative for activity change, appetite change, fatigue and fever.  HENT: Negative for congestion and rhinorrhea.   Eyes: Negative for visual disturbance.  Respiratory: Positive for shortness of breath. Negative for cough, chest tightness and stridor.   Cardiovascular: Negative for chest pain.  Gastrointestinal: Negative for abdominal pain and nausea.  Genitourinary: Negative for dysuria and hematuria.  Musculoskeletal: Negative for arthralgias and myalgias.  Skin: Negative for rash.  Neurological: Negative for dizziness, weakness, light-headedness and headaches.   all  other systems are negative except as noted in the HPI and PMH.    Physical Exam Updated Vital Signs BP (!) 169/49   Pulse 75   Resp (!) 25   Ht 5\' 4"  (1.626 m)   Wt 64.4 kg   SpO2 99%   BMI 24.37 kg/m   Physical Exam Vitals and nursing note reviewed.  Constitutional:      General: She is not in acute distress.    Appearance: She is well-developed.     Comments: Speaking full sentences, no distress  HENT:     Head: Normocephalic and atraumatic.     Mouth/Throat:     Pharynx: No oropharyngeal exudate.  Eyes:     Conjunctiva/sclera: Conjunctivae  normal.     Pupils: Pupils are equal, round, and reactive to light.  Neck:     Comments: No meningismus. Cardiovascular:     Rate and Rhythm: Normal rate and regular rhythm.     Heart sounds: Normal heart sounds. No murmur heard.   Pulmonary:     Effort: Pulmonary effort is normal. No respiratory distress.     Breath sounds: Rales present.     Comments: Diminished breath sounds basilar crackles Abdominal:     Palpations: Abdomen is soft.     Tenderness: There is no abdominal tenderness. There is no guarding or rebound.  Musculoskeletal:        General: No tenderness. Normal range of motion.     Cervical back: Normal range of motion and neck supple.     Right lower leg: No edema.     Left lower leg: No edema.  Skin:    General: Skin is warm.  Neurological:     Mental Status: She is alert and oriented to person, place, and time.     Cranial Nerves: No cranial nerve deficit.     Motor: No abnormal muscle tone.     Coordination: Coordination normal.     Comments:  5/5 strength throughout. CN 2-12 intact.Equal grip strength.   Psychiatric:        Behavior: Behavior normal.     ED Results / Procedures / Treatments   Labs (all labs ordered are listed, but only abnormal results are displayed) Labs Reviewed  CBC - Abnormal; Notable for the following components:      Result Value   WBC 16.4 (*)    Platelets 452 (*)    All  other components within normal limits  COMPREHENSIVE METABOLIC PANEL - Abnormal; Notable for the following components:   Sodium 131 (*)    Chloride 97 (*)    CO2 21 (*)    Glucose, Bld 178 (*)    Calcium 8.5 (*)    All other components within normal limits  BRAIN NATRIURETIC PEPTIDE - Abnormal; Notable for the following components:   B Natriuretic Peptide 379.0 (*)    All other components within normal limits  CBG MONITORING, ED - Abnormal; Notable for the following components:   Glucose-Capillary 225 (*)    All other components within normal limits  RESPIRATORY PANEL BY RT PCR (FLU A&B, COVID)  MAGNESIUM  PHOSPHORUS  CBC WITH DIFFERENTIAL/PLATELET  BASIC METABOLIC PANEL  TROPONIN I (HIGH SENSITIVITY)  TROPONIN I (HIGH SENSITIVITY)    EKG EKG Interpretation  Date/Time:  Wednesday December 10 2019 22:26:21 EDT Ventricular Rate:  88 PR Interval:    QRS Duration: 84 QT Interval:  390 QTC Calculation: 472 R Axis:   -15 Text Interpretation: Sinus rhythm Probable left atrial enlargement Borderline left axis deviation Borderline ST depression, diffuse leads When compared with ECG of 05/24/2015, ST depression in diffuse leads is now present Confirmed by Delora Fuel ([id number]) on 12/10/2019 10:28:35 PM   Radiology DG Chest Port 1 View  Result Date: 12/10/2019 CLINICAL DATA:  Shortness of breath for 2 days, worse with lying flat. History of hypertension and diabetes. EXAM: PORTABLE CHEST 1 VIEW COMPARISON:  06/24/2005 FINDINGS: Shallow inspiration. Heart size is normal. Diffuse nodular and interstitial infiltrative pattern to the lungs may represent diffuse pneumonitis. Peribronchial thickening. Blunting of the left costophrenic angle suggests a small effusion. Calcification of the aorta. Degenerative changes in the shoulders. IMPRESSION: Diffuse nodular and interstitial infiltrative pattern to the lungs suggesting diffuse pneumonitis.  Small left pleural effusion. Electronically Signed   By:  Lucienne Capers M.D.   On: 12/10/2019 23:08    Procedures .Critical Care Performed by: Ezequiel Essex, MD Authorized by: Ezequiel Essex, MD   Critical care provider statement:    Critical care time (minutes):  45   Critical care was necessary to treat or prevent imminent or life-threatening deterioration of the following conditions:  Cardiac failure   Critical care was time spent personally by me on the following activities:  Discussions with consultants, evaluation of patient's response to treatment, examination of patient, ordering and performing treatments and interventions, ordering and review of laboratory studies, ordering and review of radiographic studies, pulse oximetry, re-evaluation of patient's condition, obtaining history from patient or surrogate and review of old charts   (including critical care time)  Medications Ordered in ED Medications - No data to display  ED Course  I have reviewed the triage vital signs and the nursing notes.  Pertinent labs & imaging results that were available during my care of the patient were reviewed by me and considered in my medical decision making (see chart for details).    MDM Rules/Calculators/A&P                         Acute onset of shortness of breath, PND, orthopnea.  No chest pain.  She is not hypoxic at rest.  EKG shows new diffuse ST depressions.  She denies any chest pain.  Chest x-ray shows diffuse interstitial infiltrative pattern suggestive of pneumonitis per radiology.  Concern for hypertensive emergency.  With pulmonary edema.  She is put on IV nitroglycerin and given IV Lasix. Labs shows nonspecific leukocytosis.  She denies any cough. EKG does have new ST depressions diffusely.  No chest pain.  Concern for new onset heart failure with hypertensive emergency. Does have EKG changes. Troponin negative.  Patient remained stable on nasal cannula  Admission discussed with Dr. Olevia Bowens.  [name] was evaluated  in Emergency Department on 12/11/2019 for the symptoms described in the history of present illness. She was evaluated in the context of the global COVID-19 pandemic, which necessitated consideration that the patient might be at risk for infection with the SARS-CoV-2 virus that causes COVID-19. Institutional protocols and algorithms that pertain to the evaluation of patients at risk for COVID-19 are in a state of rapid change based on information released by regulatory bodies including the CDC and federal and state organizations. These policies and algorithms were followed during the patient's care in the ED.  Final Clinical Impression(s) / ED Diagnoses Final diagnoses:  Hypertensive emergency  Congestive heart failure, unspecified HF chronicity, unspecified heart failure type [facility])    Rx / DC Orders ED Discharge Orders    None       Tersa Fotopoulos, Annie Main, MD 12/11/19 [phone]

## 2019-12-10 NOTE — ED Triage Notes (Signed)
Pt from home via [facility]. Pt C/o SOB x 2 days. Pt report the SOB is worse when lying flat. Denies pain.

## 2019-12-11 ENCOUNTER — Observation Stay (HOSPITAL_COMMUNITY): Payer: Medicare Other

## 2019-12-11 ENCOUNTER — Encounter (HOSPITAL_COMMUNITY): Payer: Self-pay | Admitting: Internal Medicine

## 2019-12-11 DIAGNOSIS — R9389 Abnormal findings on diagnostic imaging of other specified body structures: Secondary | ICD-10-CM | POA: Diagnosis not present

## 2019-12-11 DIAGNOSIS — I161 Hypertensive emergency: Secondary | ICD-10-CM | POA: Diagnosis not present

## 2019-12-11 DIAGNOSIS — I10 Essential (primary) hypertension: Secondary | ICD-10-CM | POA: Diagnosis not present

## 2019-12-11 DIAGNOSIS — Z79899 Other long term (current) drug therapy: Secondary | ICD-10-CM | POA: Diagnosis not present

## 2019-12-11 DIAGNOSIS — E119 Type 2 diabetes mellitus without complications: Secondary | ICD-10-CM

## 2019-12-11 DIAGNOSIS — I509 Heart failure, unspecified: Secondary | ICD-10-CM

## 2019-12-11 DIAGNOSIS — H409 Unspecified glaucoma: Secondary | ICD-10-CM | POA: Diagnosis present

## 2019-12-11 DIAGNOSIS — R0602 Shortness of breath: Secondary | ICD-10-CM | POA: Diagnosis present

## 2019-12-11 DIAGNOSIS — I5032 Chronic diastolic (congestive) heart failure: Secondary | ICD-10-CM | POA: Diagnosis present

## 2019-12-11 DIAGNOSIS — I342 Nonrheumatic mitral (valve) stenosis: Secondary | ICD-10-CM

## 2019-12-11 DIAGNOSIS — K219 Gastro-esophageal reflux disease without esophagitis: Secondary | ICD-10-CM

## 2019-12-11 DIAGNOSIS — Z7982 Long term (current) use of aspirin: Secondary | ICD-10-CM | POA: Diagnosis not present

## 2019-12-11 DIAGNOSIS — I340 Nonrheumatic mitral (valve) insufficiency: Secondary | ICD-10-CM | POA: Diagnosis not present

## 2019-12-11 DIAGNOSIS — Z90710 Acquired absence of both cervix and uterus: Secondary | ICD-10-CM | POA: Diagnosis not present

## 2019-12-11 DIAGNOSIS — H9190 Unspecified hearing loss, unspecified ear: Secondary | ICD-10-CM | POA: Diagnosis present

## 2019-12-11 DIAGNOSIS — Z794 Long term (current) use of insulin: Secondary | ICD-10-CM | POA: Diagnosis not present

## 2019-12-11 DIAGNOSIS — R9431 Abnormal electrocardiogram [ECG] [EKG]: Secondary | ICD-10-CM | POA: Diagnosis present

## 2019-12-11 DIAGNOSIS — E785 Hyperlipidemia, unspecified: Secondary | ICD-10-CM | POA: Diagnosis present

## 2019-12-11 DIAGNOSIS — I700 Atherosclerosis of aorta: Secondary | ICD-10-CM | POA: Diagnosis not present

## 2019-12-11 DIAGNOSIS — J8410 Pulmonary fibrosis, unspecified: Secondary | ICD-10-CM | POA: Diagnosis present

## 2019-12-11 DIAGNOSIS — Z20822 Contact with and (suspected) exposure to covid-19: Secondary | ICD-10-CM | POA: Diagnosis present

## 2019-12-11 DIAGNOSIS — I160 Hypertensive urgency: Secondary | ICD-10-CM

## 2019-12-11 DIAGNOSIS — I110 Hypertensive heart disease with heart failure: Secondary | ICD-10-CM | POA: Diagnosis present

## 2019-12-11 DIAGNOSIS — E1169 Type 2 diabetes mellitus with other specified complication: Secondary | ICD-10-CM | POA: Diagnosis present

## 2019-12-11 LAB — CBC WITH DIFFERENTIAL/PLATELET
Abs Immature Granulocytes: 0.05 10*3/uL (ref 0.00–0.07)
Basophils Absolute: 0.1 10*3/uL (ref 0.0–0.1)
Basophils Relative: 1 %
Eosinophils Absolute: 0.1 10*3/uL (ref 0.0–0.5)
Eosinophils Relative: 0 %
HCT: 39.8 % (ref 36.0–46.0)
Hemoglobin: 12.2 g/dL (ref 12.0–15.0)
Immature Granulocytes: 0 %
Lymphocytes Relative: 6 %
Lymphs Abs: 1 10*3/uL (ref 0.7–4.0)
MCH: 26.7 pg (ref 26.0–34.0)
MCHC: 30.7 g/dL (ref 30.0–36.0)
MCV: 87.1 fL (ref 80.0–100.0)
Monocytes Absolute: 0.7 10*3/uL (ref 0.1–1.0)
Monocytes Relative: 5 %
Neutro Abs: 13.5 10*3/uL — ABNORMAL HIGH (ref 1.7–7.7)
Neutrophils Relative %: 88 %
Platelets: 414 10*3/uL — ABNORMAL HIGH (ref 150–400)
RBC: 4.57 MIL/uL (ref 3.87–5.11)
RDW: 16.1 % — ABNORMAL HIGH (ref 11.5–15.5)
WBC: 15.3 10*3/uL — ABNORMAL HIGH (ref 4.0–10.5)
nRBC: 0 % (ref 0.0–0.2)

## 2019-12-11 LAB — BASIC METABOLIC PANEL
Anion gap: 17 — ABNORMAL HIGH (ref 5–15)
BUN: 16 mg/dL (ref 8–23)
CO2: 21 mmol/L — ABNORMAL LOW (ref 22–32)
Calcium: 8.8 mg/dL — ABNORMAL LOW (ref 8.9–10.3)
Chloride: 95 mmol/L — ABNORMAL LOW (ref 98–111)
Creatinine, Ser: 0.68 mg/dL (ref 0.44–1.00)
GFR calc Af Amer: >60 mL/min (ref >60)
GFR calc non Af Amer: >60 mL/min (ref >60)
Glucose, Bld: 159 mg/dL — ABNORMAL HIGH (ref 70–99)
Potassium: 4.3 mmol/L (ref 3.5–5.1)
Sodium: 133 mmol/L — ABNORMAL LOW (ref 135–145)

## 2019-12-11 LAB — HEMOGLOBIN A1C
Hgb A1c MFr Bld: 6.7 % — ABNORMAL HIGH (ref 4.8–5.6)
Mean Plasma Glucose: 145.59 mg/dL

## 2019-12-11 LAB — GLUCOSE, CAPILLARY
Glucose-Capillary: 188 mg/dL — ABNORMAL HIGH (ref 70–99)
Glucose-Capillary: 127 mg/dL — ABNORMAL HIGH (ref 70–99)
Glucose-Capillary: 212 mg/dL — ABNORMAL HIGH (ref 70–99)
Glucose-Capillary: 174 mg/dL — ABNORMAL HIGH (ref 70–99)

## 2019-12-11 LAB — CBG MONITORING, ED
Glucose-Capillary: 225 mg/dL — ABNORMAL HIGH (ref 70–99)
Glucose-Capillary: 133 mg/dL — ABNORMAL HIGH (ref 70–99)

## 2019-12-11 LAB — ECHOCARDIOGRAM COMPLETE
AR max vel: 2.29 cm2
AV Area VTI: 2.41 cm2
AV Area mean vel: 2.15 cm2
AV Mean grad: 4.8 mmHg
AV Peak grad: 9.5 mmHg
Ao pk vel: 1.54 m/s
Area-P 1/2: 4.49 cm2
Height: 64 in
MV M vel: 5.82 m/s
MV Peak grad: 135.5 mmHg
S' Lateral: 2.45 cm
Weight: 2271.62 oz

## 2019-12-11 LAB — TROPONIN I (HIGH SENSITIVITY): Troponin I (High Sensitivity): 11 ng/L (ref <18)

## 2019-12-11 LAB — MAGNESIUM: Magnesium: 1.5 mg/dL — ABNORMAL LOW (ref 1.7–2.4)

## 2019-12-11 LAB — PHOSPHORUS: Phosphorus: 3.5 mg/dL (ref 2.5–4.6)

## 2019-12-11 LAB — PROCALCITONIN: Procalcitonin: <0.1 ng/mL

## 2019-12-11 MED ORDER — ENOXAPARIN SODIUM 40 MG/0.4ML ~~LOC~~ SOLN
40.0000 mg | SUBCUTANEOUS | Status: DC
  Administered 2019-12-11 – 2019-12-12 (×3): 40 mg via SUBCUTANEOUS
  Filled 2019-12-11 (×3): qty 0.4

## 2019-12-11 MED ORDER — ACETAMINOPHEN 325 MG PO TABS: 650.0000 mg | ORAL_TABLET | Freq: Four times a day (QID) | ORAL | Status: DC | PRN

## 2019-12-11 MED ORDER — LINAGLIPTIN 5 MG PO TABS
5.0000 mg | ORAL_TABLET | Freq: Every day | ORAL | Status: DC
  Administered 2019-12-11: 5 mg via ORAL
  Filled 2019-12-11: qty 1

## 2019-12-11 MED ORDER — KETOROLAC TROMETHAMINE 30 MG/ML IJ SOLN
15.0000 mg | Freq: Once | INTRAMUSCULAR | Status: AC
  Administered 2019-12-11: 15 mg via INTRAVENOUS
  Filled 2019-12-11: qty 1

## 2019-12-11 MED ORDER — ACETAMINOPHEN 325 MG PO TABS
650.0000 mg | ORAL_TABLET | Freq: Once | ORAL | Status: AC
  Administered 2019-12-11: 650 mg via ORAL
  Filled 2019-12-11: qty 2

## 2019-12-11 MED ORDER — PANTOPRAZOLE SODIUM 40 MG PO TBEC
40.0000 mg | DELAYED_RELEASE_TABLET | Freq: Every day | ORAL | Status: DC
  Administered 2019-12-11 – 2019-12-12 (×3): 40 mg via ORAL
  Filled 2019-12-11 (×3): qty 1

## 2019-12-11 MED ORDER — METFORMIN HCL 500 MG PO TABS: 1000.0000 mg | ORAL_TABLET | Freq: Two times a day (BID) | ORAL | Status: DC

## 2019-12-11 MED ORDER — LATANOPROST 0.005 % OP SOLN
1.0000 [drp] | Freq: Every day | OPHTHALMIC | Status: DC
  Filled 2019-12-11: qty 2.5

## 2019-12-11 MED ORDER — ACETAMINOPHEN 650 MG RE SUPP: 650.0000 mg | Freq: Four times a day (QID) | RECTAL | Status: DC | PRN

## 2019-12-11 MED ORDER — FUROSEMIDE 10 MG/ML IJ SOLN
30.0000 mg | Freq: Once | INTRAMUSCULAR | Status: AC
  Administered 2019-12-11: 30 mg via INTRAVENOUS
  Filled 2019-12-11: qty 4

## 2019-12-11 MED ORDER — SITAGLIPTIN PHOS-METFORMIN HCL 50-1000 MG PO TABS: 1.0000 | ORAL_TABLET | Freq: Two times a day (BID) | ORAL | Status: DC

## 2019-12-11 MED ORDER — ONDANSETRON HCL 4 MG/2ML IJ SOLN
4.0000 mg | Freq: Once | INTRAMUSCULAR | Status: AC
  Administered 2019-12-11: 4 mg via INTRAVENOUS
  Filled 2019-12-11: qty 2

## 2019-12-11 MED ORDER — NITROGLYCERIN IN D5W 200-5 MCG/ML-% IV SOLN
5.0000 ug/min | INTRAVENOUS | Status: AC
  Administered 2019-12-11: 5 ug/min via INTRAVENOUS

## 2019-12-11 MED ORDER — NITROGLYCERIN IN D5W 200-5 MCG/ML-% IV SOLN
5.0000 ug/min | INTRAVENOUS | Status: DC
  Filled 2019-12-11: qty 250

## 2019-12-11 MED ORDER — GADOBUTROL 1 MMOL/ML IV SOLN
7.0000 mL | Freq: Once | INTRAVENOUS | Status: AC | PRN
  Administered 2019-12-11: 7 mL via INTRAVENOUS

## 2019-12-11 MED ORDER — FUROSEMIDE 10 MG/ML IJ SOLN: 40.0000 mg | Freq: Once | INTRAMUSCULAR | Status: DC

## 2019-12-11 MED ORDER — HYDROCHLOROTHIAZIDE 25 MG PO TABS
12.5000 mg | ORAL_TABLET | Freq: Every day | ORAL | Status: DC
  Administered 2019-12-11 – 2019-12-12 (×2): 12.5 mg via ORAL
  Filled 2019-12-11 (×2): qty 1

## 2019-12-11 MED ORDER — METOPROLOL TARTRATE 5 MG/5ML IV SOLN
5.0000 mg | Freq: Four times a day (QID) | INTRAVENOUS | Status: DC | PRN
  Administered 2019-12-11 – 2019-12-12 (×2): 5 mg via INTRAVENOUS
  Filled 2019-12-11 (×2): qty 5

## 2019-12-11 MED ORDER — AMLODIPINE BESYLATE 5 MG PO TABS
10.0000 mg | ORAL_TABLET | Freq: Every day | ORAL | Status: DC
  Administered 2019-12-11 – 2019-12-12 (×2): 10 mg via ORAL
  Filled 2019-12-11 (×2): qty 2

## 2019-12-11 MED ORDER — INSULIN ASPART 100 UNIT/ML ~~LOC~~ SOLN: 0.0000 [IU] | Freq: Every day | SUBCUTANEOUS | Status: DC

## 2019-12-11 MED ORDER — INSULIN ASPART 100 UNIT/ML ~~LOC~~ SOLN: 0.0000 [IU] | Freq: Three times a day (TID) | SUBCUTANEOUS | Status: DC

## 2019-12-11 MED ORDER — ATORVASTATIN CALCIUM 20 MG PO TABS
20.0000 mg | ORAL_TABLET | Freq: Every day | ORAL | Status: DC
  Administered 2019-12-11 – 2019-12-12 (×2): 20 mg via ORAL
  Filled 2019-12-11 (×2): qty 1

## 2019-12-11 MED ORDER — HYDRALAZINE HCL 20 MG/ML IJ SOLN
10.0000 mg | Freq: Once | INTRAMUSCULAR | Status: AC
  Administered 2019-12-11: 10 mg via INTRAVENOUS
  Filled 2019-12-11: qty 1

## 2019-12-11 MED ORDER — INSULIN ASPART 100 UNIT/ML ~~LOC~~ SOLN
0.0000 [IU] | Freq: Three times a day (TID) | SUBCUTANEOUS | Status: DC
  Administered 2019-12-11: 2 [IU] via SUBCUTANEOUS
  Administered 2019-12-11 (×2): 1 [IU] via SUBCUTANEOUS
  Administered 2019-12-12: 9 [IU] via SUBCUTANEOUS
  Administered 2019-12-12: 2 [IU] via SUBCUTANEOUS
  Filled 2019-12-11: qty 1

## 2019-12-11 MED ORDER — LISINOPRIL 10 MG PO TABS
30.0000 mg | ORAL_TABLET | Freq: Every day | ORAL | Status: DC
  Administered 2019-12-11 (×2): 30 mg via ORAL
  Filled 2019-12-11 (×2): qty 3

## 2019-12-11 MED ORDER — POTASSIUM CHLORIDE CRYS ER 20 MEQ PO TBCR
40.0000 meq | EXTENDED_RELEASE_TABLET | Freq: Once | ORAL | Status: AC
  Administered 2019-12-11: 40 meq via ORAL
  Filled 2019-12-11: qty 2

## 2019-12-11 MED ORDER — LORAZEPAM 1 MG PO TABS
1.0000 mg | ORAL_TABLET | Freq: Once | ORAL | Status: AC | PRN
  Administered 2019-12-11: 1 mg via ORAL
  Filled 2019-12-11: qty 1

## 2019-12-11 MED ORDER — ASPIRIN EC 81 MG PO TBEC
81.0000 mg | DELAYED_RELEASE_TABLET | Freq: Every day | ORAL | Status: DC
  Administered 2019-12-11 – 2019-12-12 (×2): 81 mg via ORAL
  Filled 2019-12-11 (×2): qty 1

## 2019-12-11 MED ORDER — CHLORHEXIDINE GLUCONATE CLOTH 2 % EX PADS
6.0000 | MEDICATED_PAD | Freq: Every day | CUTANEOUS | Status: DC
  Administered 2019-12-11 – 2019-12-12 (×2): 6 via TOPICAL

## 2019-12-11 MED ORDER — HYDRALAZINE HCL 25 MG PO TABS
25.0000 mg | ORAL_TABLET | Freq: Three times a day (TID) | ORAL | Status: DC
  Administered 2019-12-11: 25 mg via ORAL
  Filled 2019-12-11: qty 1

## 2019-12-11 MED ORDER — HYDRALAZINE HCL 25 MG PO TABS
50.0000 mg | ORAL_TABLET | Freq: Three times a day (TID) | ORAL | Status: DC
  Administered 2019-12-11 – 2019-12-12 (×3): 50 mg via ORAL
  Filled 2019-12-11 (×3): qty 2

## 2019-12-11 MED ORDER — MAGNESIUM SULFATE 2 GM/50ML IV SOLN
2.0000 g | Freq: Once | INTRAVENOUS | Status: AC
  Administered 2019-12-11: 2 g via INTRAVENOUS
  Filled 2019-12-11: qty 50

## 2019-12-11 MED ORDER — GLIPIZIDE 5 MG PO TABS: 10.0000 mg | ORAL_TABLET | Freq: Every day | ORAL | Status: DC

## 2019-12-11 MED ORDER — LISINOPRIL 10 MG PO TABS
40.0000 mg | ORAL_TABLET | Freq: Every day | ORAL | Status: DC
  Administered 2019-12-12: 40 mg via ORAL
  Filled 2019-12-11: qty 4

## 2019-12-11 MED ORDER — POTASSIUM CHLORIDE CRYS ER 20 MEQ PO TBCR
20.0000 meq | EXTENDED_RELEASE_TABLET | Freq: Once | ORAL | Status: AC
  Administered 2019-12-11: 20 meq via ORAL
  Filled 2019-12-11: qty 1

## 2019-12-11 MED ORDER — PROCHLORPERAZINE EDISYLATE 10 MG/2ML IJ SOLN: 5.0000 mg | INTRAMUSCULAR | Status: DC | PRN

## 2019-12-11 NOTE — TOC Initial Note (Signed)
Transition of Care [facility]) - Initial/Assessment Note   Patient Details  Name: [name] MRN: [id number] Date of Birth: 06-28-1935  Transition of Care [facility]) CM/SW Contact:    Sherie Don, LCSW Phone Number: 12/11/2019, 10:18 AM  Clinical Narrative: Patient is an 84 year old female who was admitted for hypertensive emergency, abnormal EKG, hypomagnesemia, and abnormal chest x-ray. TOC received consult for heart failure screening. CSW met with patient in ED to complete assessment. Per patient, she lives alone in a single family home, but is checked on regularly by her significant other, [name]. Patient reported she has a walker, cane, and shower chair at home. Patient is currently independent with her ADLs. Patient reported she is able to afford her medications each month and takes these as prescribed.   Per CHF screening, patient reported she was just diagnosed with CHF in the hospital, but has been following a heart healthy diet and limits her salt intake. Patient is not currently restricting fluids or completing daily weights. TOC to follow.  Expected Discharge Plan: Home/Self Care Barriers to Discharge: Continued Medical Work up  Patient Goals and CMS Choice Patient states their goals for this hospitalization and ongoing recovery are:: Return home CMS Medicare.gov Compare Post Acute Care list provided to:: Patient Choice offered to / list presented to : Patient  Expected Discharge Plan and Services Expected Discharge Plan: Home/Self Care In-house Referral: Clinical Social Work Discharge Planning Services: NA Living arrangements for the past 2 months: Single Family Home  Prior Living Arrangements/Services Living arrangements for the past 2 months: Single Family Home Lives with:: Self Patient language and need for interpreter reviewed:: Yes Do you feel safe going back to the place where you live?: Yes      Need for Family Participation in Patient Care: No (Comment) Care  giver support system in place?: Yes (comment) (Sister and significant other) Current home services: DME (Walker, cane, and shower chair) Criminal Activity/Legal Involvement Pertinent to Current Situation/Hospitalization: No - Comment as needed  Emotional Assessment Appearance:: Appears stated age Attitude/Demeanor/Rapport: Engaged Affect (typically observed): Accepting, Appropriate Orientation: : Oriented to Self, Oriented to Place, Oriented to  Time, Oriented to Situation Alcohol / Substance Use: Not Applicable Psych Involvement: No (comment)  Admission diagnosis:  Hypertensive emergency [I16.1] Patient Active Problem List   Diagnosis Date Noted  . Hypertensive emergency 12/11/2019  . Abnormal EKG 12/11/2019  . Hypomagnesemia 12/11/2019  . Abnormal chest x-ray 12/11/2019  . Glaucoma   . GERD (gastroesophageal reflux disease)   . Hypocalcemia   . Type 2 diabetes mellitus ([place])   . Uncontrolled type 2 diabetes mellitus with hyperglycemia ([place]) 09/01/2019  . Hypertension associated with diabetes ([place]) 09/01/2019  . Hyperlipidemia associated with type 2 diabetes mellitus ([place]) 09/01/2019  . Microalbuminuria due to type 2 diabetes mellitus ([place]) 06/18/2019   PCP:  Janora Norlander, DO Pharmacy:   [facility] [address], [place] [address] [place] [address] Phone: [phone] Fax: [phone]  Readmission Risk Interventions No flowsheet data found.

## 2019-12-11 NOTE — ED Notes (Signed)
Patient heart rate dropped into upper 40's. Nitro dialed back to previous dose.

## 2019-12-11 NOTE — Progress Notes (Signed)
*  PRELIMINARY RESULTS* Echocardiogram 2D Echocardiogram has been performed.  Leavy Cella 12/11/2019, 11:39 AM

## 2019-12-11 NOTE — ED Notes (Signed)
Verbal order given to stop NTG drip per Dr Wynetta Emery.

## 2019-12-11 NOTE — Progress Notes (Signed)
Pt reports feeling hot and states she feels like her sugar is falling. CBG 127. Primary RN notified.

## 2019-12-11 NOTE — ED Notes (Signed)
Echo in progress.

## 2019-12-11 NOTE — Progress Notes (Signed)
PROGRESS NOTE   [name]  JKK:[id number] DOB: Sep 13, 1935 DOA: 12/10/2019 PCP: Janora Norlander, DO   Chief Complaint  Patient presents with  . Shortness of Breath    Brief Admission History:  84 y.o. female with medical history significant of osteoarthritis, degenerative disc disease of the lumbosacral spine, type 2 diabetes, history of bilateral distal radial fractures, GERD, glaucoma, impaired hearing of left ear, hypertension, PONV who is coming to the emergency department due to progressively worse dyspnea for the past 2 days associated with orthopnea in the evening.  This did not improve after the patient tried propping herself up with multiple pillows.  She also has been having an occasional dry cough, headaches and fatigue.  She denies chest pain, palpitations, diaphoresis, recent lower extremity pitting edema.  No fever, chills, rhinorrhea, sore throat, wheezing or hemoptysis.    Assessment & Plan:   Principal Problem:   Hypertensive emergency Active Problems:   Hyperlipidemia associated with type 2 diabetes mellitus (HCC)   Glaucoma   GERD (gastroesophageal reflux disease)   Hypocalcemia   Type 2 diabetes mellitus (HCC)   Abnormal EKG   Hypomagnesemia   Abnormal chest x-ray   Hypertensive urgency  1. Hypertensive urgency - BP remains difficult to control but we have been able to get her off the IV nitroglycerin infusion.  MRA renal arteries ordered.  2. Uncontrolled hypertension - added hydralazine 50 mg TID.  Increased lisinopril to 40 mg.  2D echo pending.  3. Chronic diastolic CHF - Lasix ordered.   4. Hypomagnesemia - IV replacement ordered. Recheck in AM.  5. Type 2 DM - continue SSI coverage.  Hold home oral diabetes medications.  6. Pneumonitis - check procalcitonin, recheck CXR in AM.  7. HLD - stable.  DVT prophylaxis:  lovenox  Code Status: full  Family Communication: husband at bedside  Disposition:   Status is: Inpatient  Remains inpatient  appropriate because:Hemodynamically unstable, Persistent severe electrolyte disturbances and Inpatient level of care appropriate due to severity of illness   Dispo: The patient is from: Home              Anticipated d/c is to: Home              Anticipated d/c date is: 3 days              Patient currently is not medically stable to d/c.  Consultants:   pCCM  Procedures:   n/a  Antimicrobials:    Subjective: Pt reports feeling hot. She says her breathing is much better today.  No SOB and no chest pain.    Objective: Vitals:   12/11/19 1100 12/11/19 1415 12/11/19 1500 12/11/19 1546  BP: (!) 160/62 (!) 180/45 (!) 150/37 (!) 148/51  Pulse:  70 73 70  Resp: 15 (!) 21 (!) 21 (!) 23  Temp:      TempSrc:      SpO2:  97% 97% 93%  Weight:      Height:        Intake/Output Summary (Last 24 hours) at 12/11/2019 1627 Last data filed at 12/11/2019 1007 Gross per 24 hour  Intake 6 ml  Output --  Net 6 ml   Filed Weights   12/10/19 2222  Weight: 64.4 kg    Examination:  General exam: Appears calm and comfortable  Respiratory system: Clear to auscultation. Respiratory effort normal. Cardiovascular system: S1 & S2 heard, RRR. No JVD, murmurs, rubs, gallops or clicks. No pedal edema. Gastrointestinal  system: Abdomen is nondistended, soft and nontender. No organomegaly or masses felt. Normal bowel sounds heard. Central nervous system: Alert and oriented. No focal neurological deficits. Extremities: Symmetric 5 x 5 power. Skin: No rashes, lesions or ulcers Psychiatry: Judgement and insight appear normal. Mood & affect appropriate.   Data Reviewed: I have personally reviewed following labs and imaging studies  CBC: Recent Labs  Lab 12/10/19 2240 12/11/19 0027  WBC 16.4* 15.3*  NEUTROABS  --  13.5*  HGB 12.2 12.2  HCT 39.1 39.8  MCV 85.2 87.1  PLT 452* 414*    Basic Metabolic Panel: Recent Labs  Lab 12/10/19 2240 12/11/19 0027  NA 131* 133*  K 3.6 4.3  CL 97* 95*   CO2 21* 21*  GLUCOSE 178* 159*  BUN 16 16  CREATININE 0.69 0.68  CALCIUM 8.5* 8.8*  MG  --  1.5*  PHOS  --  3.5    GFR: Estimated Creatinine Clearance: 47.6 mL/min (by C-G formula based on SCr of 0.68 mg/dL).  Liver Function Tests: Recent Labs  Lab 12/10/19 2240  AST 19  ALT 18  ALKPHOS 54  BILITOT 0.6  PROT 7.3  ALBUMIN 3.7    CBG: Recent Labs  Lab 12/11/19 0148 12/11/19 0921 12/11/19 1214 12/11/19 1519  GLUCAP 225* 133* 188* 127*    Recent Results (from the past 240 hour(s))  Respiratory Panel by RT PCR (Flu A&B, Covid) - Nasopharyngeal Swab     Status: None   Collection Time: 12/10/19 11:09 PM   Specimen: Nasopharyngeal Swab  Result Value Ref Range Status   SARS Coronavirus 2 by RT PCR NEGATIVE NEGATIVE Final    Comment: (NOTE) SARS-CoV-2 target nucleic acids are NOT DETECTED.  The SARS-CoV-2 RNA is generally detectable in upper respiratoy specimens during the acute phase of infection. The lowest concentration of SARS-CoV-2 viral copies this assay can detect is 131 copies/mL. A negative result does not preclude SARS-Cov-2 infection and should not be used as the sole basis for treatment or other patient management decisions. A negative result may occur with  improper specimen collection/handling, submission of specimen other than nasopharyngeal swab, presence of viral mutation(s) within the areas targeted by this assay, and inadequate number of viral copies (<131 copies/mL). A negative result must be combined with clinical observations, patient history, and epidemiological information. The expected result is Negative.  Fact Sheet for Patients:  PinkCheek.be  Fact Sheet for Healthcare Providers:  GravelBags.it  This test is no t yet approved or cleared by the [place] FDA and  has been authorized for detection and/or diagnosis of SARS-CoV-2 by FDA under an Emergency Use Authorization  (EUA). This EUA will remain  in effect (meaning this test can be used) for the duration of the COVID-19 declaration under Section 564(b)(1) of the Act, 21 U.S.C. section 360bbb-3(b)(1), unless the authorization is terminated or revoked sooner.     Influenza A by PCR NEGATIVE NEGATIVE Final   Influenza B by PCR NEGATIVE NEGATIVE Final    Comment: (NOTE) The Xpert Xpress SARS-CoV-2/FLU/RSV assay is intended as an aid in  the diagnosis of influenza from Nasopharyngeal swab specimens and  should not be used as a sole basis for treatment. Nasal washings and  aspirates are unacceptable for Xpert Xpress SARS-CoV-2/FLU/RSV  testing.  Fact Sheet for Patients: PinkCheek.be  Fact Sheet for Healthcare Providers: GravelBags.it  This test is not yet approved or cleared by the [place] FDA and  has been authorized for detection and/or diagnosis of SARS-CoV-2 by  FDA under an Emergency Use Authorization (EUA). This EUA will remain  in effect (meaning this test can be used) for the duration of the  Covid-19 declaration under Section 564(b)(1) of the Act, 21  U.S.C. section 360bbb-3(b)(1), unless the authorization is  terminated or revoked. Performed at [facility], [address], [place], [place] [address]      Radiology Studies: [facility] Chest [facility] 1 View  Result Date: 12/10/2019 CLINICAL DATA:  Shortness of breath for 2 days, worse with lying flat. History of hypertension and diabetes. EXAM: PORTABLE CHEST 1 VIEW COMPARISON:  06/24/2005 FINDINGS: Shallow inspiration. Heart size is normal. Diffuse nodular and interstitial infiltrative pattern to the lungs may represent diffuse pneumonitis. Peribronchial thickening. Blunting of the left costophrenic angle suggests a small effusion. Calcification of the aorta. Degenerative changes in the shoulders. IMPRESSION: Diffuse nodular and interstitial infiltrative pattern to the lungs suggesting  diffuse pneumonitis. Small left pleural effusion. Electronically Signed   By: Lucienne Capers M.D.   On: 12/10/2019 23:08   ECHOCARDIOGRAM COMPLETE  Result Date: 12/11/2019    ECHOCARDIOGRAM REPORT   Patient Name:   ZAIDE MCCLENAHAN Date of Exam: 12/11/2019 Medical Rec #:  [id number]     Height:       64.0 in Accession #:    [id number]    Weight:       142.0 lb Date of Birth:  Dec 29, 1935    BSA:          1.691 m Patient Age:    84 years      BP:           160/62 mmHg Patient Gender: F             HR:           74 bpm. Exam Location:  [facility] Procedure: 2D Echo Indications:    Dyspnea 786.09 / R06.00                 Abnormal ECG 794.31 / R94.31  History:        Patient has no prior history of Echocardiogram examinations.                 Abnormal ECG; Risk Factors:Dyslipidemia, Hypertension and                 Diabetes.  Sonographer:    Leavy Cella RDCS (AE) Referring Phys: [id number] DAVID MANUEL Randall  1. Left ventricular ejection fraction, by estimation, is 55 to 60%. The left ventricle has normal function. The left ventricle has no regional wall motion abnormalities. There is moderate left ventricular hypertrophy. Left ventricular diastolic parameters are consistent with Grade II diastolic dysfunction (pseudonormalization). Elevated left atrial pressure.  2. Right ventricular systolic function is normal. The right ventricular size is normal. There is mildly elevated pulmonary artery systolic pressure.  3. Left atrial size was severely dilated.  4. The mitral valve is normal in structure. Mild mitral valve regurgitation. Mild mitral stenosis.  5. The aortic valve is tricuspid. Aortic valve regurgitation is not visualized. No aortic stenosis is present.  6. The inferior vena cava is normal in size with greater than 50% respiratory variability, suggesting right atrial pressure of 3 mmHg. FINDINGS  Left Ventricle: Left ventricular ejection fraction, by estimation, is 55 to 60%. The left ventricle has  normal function. The left ventricle has no regional wall motion abnormalities. The left ventricular internal cavity size was normal in size. There is  moderate left ventricular hypertrophy.  Left ventricular diastolic parameters are consistent with Grade II diastolic dysfunction (pseudonormalization). Elevated left atrial pressure. Right Ventricle: The right ventricular size is normal. No increase in right ventricular wall thickness. Right ventricular systolic function is normal. There is mildly elevated pulmonary artery systolic pressure. The tricuspid regurgitant velocity is 2.85  m/s, and with an assumed right atrial pressure of 10 mmHg, the estimated right ventricular systolic pressure is 33.8 mmHg. Left Atrium: Left atrial size was severely dilated. Right Atrium: Right atrial size was normal in size. Pericardium: There is no evidence of pericardial effusion. Mitral Valve: The mitral valve is normal in structure. There is mild thickening of the mitral valve leaflet(s). There is mild calcification of the mitral valve leaflet(s). Severe mitral annular calcification. Mild mitral valve regurgitation. Mild mitral valve stenosis. MV peak gradient, 7.0 mmHg. The mean mitral valve gradient is 3.0 mmHg. Tricuspid Valve: The tricuspid valve is normal in structure. Tricuspid valve regurgitation is trivial. No evidence of tricuspid stenosis. Aortic Valve: The aortic valve is tricuspid. . There is mild thickening and mild calcification of the aortic valve. Aortic valve regurgitation is not visualized. No aortic stenosis is present. There is mild thickening of the aortic valve. There is mild calcification of the aortic valve. Aortic valve mean gradient measures 4.8 mmHg. Aortic valve peak gradient measures 9.5 mmHg. Aortic valve area, by VTI measures 2.41 cm. Pulmonic Valve: The pulmonic valve was not well visualized. Pulmonic valve regurgitation is mild. No evidence of pulmonic stenosis. Aorta: The aortic root is normal in  size and structure. Pulmonary Artery: Indeterminant PASP, inadequate TR jet. Venous: The inferior vena cava is normal in size with greater than 50% respiratory variability, suggesting right atrial pressure of 3 mmHg. IAS/Shunts: The interatrial septum was not well visualized.  LEFT VENTRICLE PLAX 2D LVIDd:         3.38 cm  Diastology LVIDs:         2.45 cm  LV e' lateral:   4.20 cm/s LV PW:         1.47 cm  LV E/e' lateral: 29.3 LV IVS:        1.38 cm  LV e' medial:    3.68 cm/s LVOT diam:     1.90 cm  LV E/e' medial:  33.4 LV SV:         79 LV SV Index:   47 LVOT Area:     2.84 cm  RIGHT VENTRICLE RV S prime:     16.80 cm/s TAPSE (M-mode): 2.3 cm LEFT ATRIUM              Index       RIGHT ATRIUM           Index LA diam:        4.50 cm  2.66 cm/m  RA Area:     14.60 cm LA Vol (A2C):   109.0 ml 64.45 ml/m RA Volume:   41.50 ml  24.54 ml/m LA Vol (A4C):   79.8 ml  47.18 ml/m LA Biplane Vol: 100.0 ml 59.13 ml/m  AORTIC VALVE AV Area (Vmax):    2.29 cm AV Area (Vmean):   2.15 cm AV Area (VTI):     2.41 cm AV Vmax:           153.76 cm/s AV Vmean:          103.653 cm/s AV VTI:            0.328 m AV Peak Grad:  9.5 mmHg AV Mean Grad:      4.8 mmHg LVOT Vmax:         124.25 cm/s LVOT Vmean:        78.629 cm/s LVOT VTI:          0.280 m LVOT/AV VTI ratio: 0.85  AORTA Ao Root diam: 2.60 cm MITRAL VALVE                TRICUSPID VALVE MV Area (PHT): 4.49 cm     TR Peak grad:   32.5 mmHg MV Peak grad:  7.0 mmHg     TR Vmax:        285.00 cm/s MV Mean grad:  3.0 mmHg MV Vmax:       1.32 m/s     SHUNTS MV Vmean:      74.4 cm/s    Systemic VTI:  0.28 m MV Decel Time: 169 msec     Systemic Diam: 1.90 cm MR Peak grad: 135.5 mmHg MR Vmax:      582.00 cm/s MV E velocity: 123.00 cm/s MV A velocity: 101.00 cm/s MV E/A ratio:  1.22 Carlyle Dolly MD Electronically signed by Carlyle Dolly MD Signature Date/Time: 12/11/2019/3:24:19 PM    Final      Scheduled Meds: . amLODipine  10 mg Oral Daily  . aspirin EC  81 mg  Oral Daily  . atorvastatin  20 mg Oral Daily  . Chlorhexidine Gluconate Cloth  6 each Topical Daily  . enoxaparin (LOVENOX) injection  40 mg Subcutaneous Q24H  . furosemide  30 mg Intravenous Once  . hydrALAZINE  50 mg Oral Q8H  . hydrochlorothiazide  12.5 mg Oral Daily  . insulin aspart  0-9 Units Subcutaneous TID WC  . latanoprost  1 drop Both Eyes QHS  . [START ON 12/12/2019] lisinopril  40 mg Oral Daily  . pantoprazole  40 mg Oral Daily  . potassium chloride  40 mEq Oral Once   Continuous Infusions:   LOS: 0 days   Critical Care Procedure Note Authorized and Performed by: Murvin Natal MD  Total Critical Care time:  35 mins  Due to a high probability of clinically significant, life threatening deterioration, the patient required my highest level of preparedness to intervene emergently and I personally spent this critical care time directly and personally managing the patient.  This critical care time included obtaining a history; examining the patient, pulse oximetry; ordering and review of studies; arranging urgent treatment with development of a management plan; evaluation of patient's response of treatment; frequent reassessment; and discussions with other providers.  This critical care time was performed to assess and manage the high probability of imminent and life threatening deterioration that could result in multi-organ failure.  It was exclusive of separately billable procedures and treating other patients and teaching time.   Irwin Brakeman, MD How to contact the [facility] Attending or Consulting provider [place] or covering provider during after hours [place], for this patient?  1. Check the care team in [facility] and look for a) attending/consulting [facility] provider listed and b) the [facility] team listed 2. Log into [email or web address] and use [place]'s universal password to access. If you do not have the password, please contact the hospital operator. 3. Locate the [facility] provider you are looking for  under [facility] and page to a number that you can be directly reached. 4. If you still have difficulty reaching the provider, please page the [facility] (Director on Call) for the  Hospitalists listed on amion for assistance.  12/11/2019, 4:27 PM

## 2019-12-11 NOTE — H&P (Signed)
History and Physical    [name]:[id number] DOB: 1935/06/01 DOA: 12/10/2019  PCP: Janora Norlander, DO   Patient coming from: Home.  I have personally briefly reviewed patient's old medical records in [place]  Chief Complaint: Shortness of breath.  HPI: [name] is a 84 y.o. female with medical history significant of osteoarthritis, degenerative disc disease of the lumbosacral spine, type 2 diabetes, history of bilateral distal radial fractures, GERD, glaucoma, impaired hearing of left ear, hypertension, PONV who is coming to the emergency department due to progressively worse dyspnea for the past 2 days associated with orthopnea in the evening.  This did not improve after the patient tried propping herself up with multiple pillows.  She also has been having an occasional dry cough, headaches and fatigue.  She denies chest pain, palpitations, diaphoresis, recent lower extremity pitting edema.  No fever, chills, rhinorrhea, sore throat, wheezing or hemoptysis.  She denies abdominal pain, nausea, emesis, diarrhea, constipation, melena or hematochezia.  No dysuria, frequency or materia.  No polyuria, polydipsia, polyphagia or blurred vision.  ED Course: Initial vital signs were temperature, pulse 84, respirations 30, BP 223/84 mmHg and O2 sat 92% on room air.  She was given supplemental oxygen, 650 mg of acetaminophen, furosemide 20 mg IVP, ketorolac 15 mg IVP, ondansetron 4 mg IVP and a nitroglycerin infusion was begun.  I added 2 g of magnesium sulfate IVPB.  CBC showed a white count of 16.4, hemoglobin 12.2 g/dL and platelets 452.  Troponin was normal twice.  BNP was 379.0 pg/mL.  SARS coronavirus 2 and influenza A/B PCR was negative. Sodium 131, potassium 3.6, chloride 97 and CO2 21 mmol/L.  Glucose 170 mg/dL.  Renal and hepatic function were within normal limits.  Her calcium was 8.5, magnesium 1.5 and phosphorus 3.5 mg/dL.  Her chest radiograph showed diffuse infiltrates  consistent with pneumonitis.  Review of Systems: As per HPI otherwise all other systems reviewed and are negative.  Past Medical History:  Diagnosis Date  . Arthritis    OA  . DDD (degenerative disc disease), lumbosacral   . Diabetes mellitus without complication ([place])   . Distal radius fracture    bilateral  . GERD (gastroesophageal reflux disease)   . Glaucoma   . HOH (hard of hearing)    left ear  . Hypertension   . PONV (postoperative nausea and vomiting)     Past Surgical History:  Procedure Laterality Date  . ABDOMINAL HYSTERECTOMY    . BREAST BIOPSY    . BREAST EXCISIONAL BIOPSY Left    benign  . BREAST SURGERY     left milk duct   . EYE SURGERY     cataract  . FRACTURE SURGERY Left    hip fracture  . OPEN REDUCTION INTERNAL FIXATION (ORIF) DISTAL RADIAL FRACTURE Bilateral 05/24/2015   Procedure: OPEN REDUCTION INTERNAL FIXATION (ORIF) BILATERAL DISTAL RADIAL FRACTURE VOLAR PLATE;  Surgeon: Daryll Brod, MD;  Location: [facility];  Service: Orthopedics;  Laterality: Bilateral;    Social History  reports that she has never smoked. She has never used smokeless tobacco. She reports that she does not drink alcohol and does not use drugs.  Allergies  Allergen Reactions  . Codeine Nausea Only    Family History  Problem Relation Age of Onset  . Heart disease Mother   . Heart disease Father    Prior to Admission medications   Medication Sig Start Date End Date Taking? Authorizing Provider  amLODipine (NORVASC) 10 MG tablet Take 1 tablet (10 mg total) by mouth daily. 09/01/19   Janora Norlander, DO  aspirin 81 MG tablet Take 81 mg by mouth daily.    [provider]  atorvastatin (LIPITOR) 20 MG tablet Take 1 tablet (20 mg total) by mouth daily. 09/01/19   Janora Norlander, DO  calcium-vitamin D (OSCAL WITH D) 500-200 MG-UNIT tablet Take 1 tablet by mouth.    [provider]  cephALEXin (KEFLEX) 500 MG capsule Take 1 capsule (500  mg total) by mouth 4 (four) times daily. 09/29/19   Dettinger, Fransisca Kaufmann, MD  fluticasone (FLONASE) 50 MCG/ACT nasal spray Place into both nostrils daily.    [provider]  glipiZIDE (GLUCOTROL) 10 MG tablet Take 1 tablet (10 mg total) by mouth daily before breakfast. 09/01/19   [name] M, DO  glucose blood test strip Test BG up to 2 times daily Dx (E 11.65) 02/12/19   [name] M, DO  hydrochlorothiazide (HYDRODIURIL) 25 MG tablet Take 1 tablet by mouth once daily 07/07/19   [name] M, DO  ibandronate (BONIVA) 150 MG tablet Take 150 mg by mouth every 30 (thirty) days. Take in the morning with a full glass of water, on an empty stomach, and do not take anything else by mouth or lie down for the next 30 min.    [provider]  insulin glargine (LANTUS) 100 UNIT/ML injection Inject 16 Units into the skin at bedtime.     [provider]  latanoprost (XALATAN) 0.005 % ophthalmic solution 1 drop at bedtime.    [provider]  lisinopril (ZESTRIL) 30 MG tablet Take 1 tablet by mouth once daily 12/01/19   [name] M, DO  metoprolol tartrate (LOPRESSOR) 50 MG tablet Take 1 tablet (50 mg total) by mouth 2 (two) times daily. 09/01/19   Janora Norlander, DO  multivitamin-iron-minerals-folic acid (CENTRUM) chewable tablet Chew 1 tablet by mouth daily.    [provider]  Omeprazole-Sodium Bicarbonate (ZEGERID) 20-1100 MG CAPS capsule Take 1 capsule by mouth daily before breakfast.    [provider]  sitaGLIPtin-metformin (JANUMET) 50-1000 MG tablet Take 1 tablet by mouth 2 (two) times daily with a meal. 09/01/19   [name] M, DO  timolol (TIMOPTIC) 0.5 % ophthalmic solution INSTILL 1 DROP INTO [facility] EYE ONCE DAILY 01/27/19   [provider]    Physical Exam: Vitals:   12/11/19 0125 12/11/19 0130 12/11/19 0145 12/11/19 0150  BP: (!) 123/103 (!) 145/60 (!) 142/109 (!) 158/50  Pulse: (!) 49 (!) 52 63 61    Resp: 18 15 19  (!) 21  SpO2: 95% 95% (!) 88% 96%  Weight:      Height:        Constitutional: NAD, calm, comfortable Eyes: PERRL, lids and conjunctivae normal ENMT: [place] in place.  Mucous membranes are moist. Posterior pharynx clear of any exudate or lesions. Neck: normal, supple, no masses, no thyromegaly Respiratory: Bibasilar crackles. Normal respiratory effort. No accessory muscle use.  Cardiovascular: Regular rate and rhythm, no murmurs / rubs / gallops. No extremity edema. 2+ pedal pulses. No carotid bruits.  Abdomen: Nondistended.  BS positive.  Soft, no tenderness, no masses palpated. No hepatosplenomegaly. Musculoskeletal: no clubbing / cyanosis.  Good ROM, no contractures. Normal muscle tone.  Skin: no significant rashes, lesions, ulcers on limited dermatological examination Neurologic: CN 2-12 grossly intact. Sensation intact, DTR normal. Strength 5/5 in all 4.  Psychiatric: Normal judgment and  insight. Alert and oriented x 3.  Mildly anxious mood.   Labs on Admission: I have personally reviewed following labs and imaging studies  CBC: Recent Labs  Lab 12/10/19 2240  WBC 16.4*  HGB 12.2  HCT 39.1  MCV 85.2  PLT 452*    Basic Metabolic Panel: Recent Labs  Lab 12/10/19 2240  NA 131*  K 3.6  CL 97*  CO2 21*  GLUCOSE 178*  BUN 16  CREATININE 0.69  CALCIUM 8.5*    GFR: Estimated Creatinine Clearance: 47.6 mL/min (by C-G formula based on SCr of 0.69 mg/dL).  Liver Function Tests: Recent Labs  Lab 12/10/19 2240  AST 19  ALT 18  ALKPHOS 54  BILITOT 0.6  PROT 7.3  ALBUMIN 3.7   Radiological Exams on Admission: DG Chest Port 1 View  Result Date: 12/10/2019 CLINICAL DATA:  Shortness of breath for 2 days, worse with lying flat. History of hypertension and diabetes. EXAM: PORTABLE CHEST 1 VIEW COMPARISON:  06/24/2005 FINDINGS: Shallow inspiration. Heart size is normal. Diffuse nodular and interstitial infiltrative pattern to the lungs may represent diffuse  pneumonitis. Peribronchial thickening. Blunting of the left costophrenic angle suggests a small effusion. Calcification of the aorta. Degenerative changes in the shoulders. IMPRESSION: Diffuse nodular and interstitial infiltrative pattern to the lungs suggesting diffuse pneumonitis. Small left pleural effusion. Electronically Signed   By: Lucienne Capers M.D.   On: 12/10/2019 23:08   EKG: Independently reviewed.  Vent. rate 88 BPM PR interval * ms QRS duration 84 ms QT/QTc 390/472 ms P-R-T axes 3 -15 32 Sinus rhythm Probable left atrial enlargement Borderline left axis deviation Borderline ST depression, diffuse leads When compared with ECG of 05/24/2015, ST depression in diffuse leads is now present  Assessment/Plan Principal Problem:   Hypertensive emergency Observation/stepdown. Continue nitroglycerin infusion. Continue amlodipine 10 mg p.o. daily. Continue lisinopril 30 mg p.o. daily. Decrease HCTZ to 12.5 mg daily to avoid side effects. Beta-blocker was held due to earlier bradycardia. Check US renal artery duplex.  Active Problems:   Abnormal chest x-ray This was read as pneumonitis. She is feeling better now. Consider repeating CXR or chest CT.    Abnormal EKG Follow-up EKG in a.m. Check echocardiogram.    Hypomagnesemia Mag sulfate 2 gr IVPB infused    Hypocalcemia Magnesium was supplemented. Repeat calcium level in a.m.    Hyperlipidemia associated with type 2 diabetes mellitus (HCC) Continue atorvastatin 20 mg p.o. daily.    Glaucoma Continue timolol drops.    GERD (gastroesophageal reflux disease) Continue PPI.    Type 2 diabetes mellitus (HCC) Continue glipizide. Continue on Janumet. CBG monitoring with RI SS. Check hemoglobin A1c.   DVT prophylaxis: Lovenox SQ. Code Status:   Full code. Family Communication:   Disposition Plan:   Patient is from:  Home.  Anticipated DC to:  Home.  Anticipated DC date:  12/12/2019.  Anticipated DC  barriers: Clinical improvement and work-up results.  Consults called: Admission status:  Observation/stepdown.  Severity of Illness:  High due to hypertensive emergency with dyspnea requiring supplemental oxygen and nitroglycerin continuous infusion.  Reubin Milan MD [facility]  How to contact the [facility] Attending or Consulting provider [place] or covering provider during after hours [place], for this patient?   1. Check the care team in [facility] and look for a) attending/consulting [facility] provider listed and b) the [facility] team listed 2. Log into [email or web address] and use [facility]'s universal password to access. If you do not have the  password, please contact the hospital operator. 3. Locate the [facility] provider you are looking for under [facility] and page to a number that you can be directly reached. 4. If you still have difficulty reaching the provider, please page the [facility] (Director on Call) for the Hospitalists listed on amion for assistance.  12/11/2019, 2:45 AM   This document was prepared using Dragon voice recognition software and may contain some unintended transcription errors.

## 2019-12-12 ENCOUNTER — Telehealth: Payer: Self-pay | Admitting: *Deleted

## 2019-12-12 DIAGNOSIS — R9389 Abnormal findings on diagnostic imaging of other specified body structures: Secondary | ICD-10-CM

## 2019-12-12 DIAGNOSIS — E785 Hyperlipidemia, unspecified: Secondary | ICD-10-CM

## 2019-12-12 DIAGNOSIS — E1169 Type 2 diabetes mellitus with other specified complication: Secondary | ICD-10-CM

## 2019-12-12 LAB — BRAIN NATRIURETIC PEPTIDE: B Natriuretic Peptide: 282 pg/mL — ABNORMAL HIGH (ref 0.0–100.0)

## 2019-12-12 LAB — CBC WITH DIFFERENTIAL/PLATELET
Abs Immature Granulocytes: 0.03 10*3/uL (ref 0.00–0.07)
Basophils Absolute: 0.1 10*3/uL (ref 0.0–0.1)
Basophils Relative: 1 %
Eosinophils Absolute: 0.3 10*3/uL (ref 0.0–0.5)
Eosinophils Relative: 3 %
HCT: 39.2 % (ref 36.0–46.0)
Hemoglobin: 12.3 g/dL (ref 12.0–15.0)
Immature Granulocytes: 0 %
Lymphocytes Relative: 15 %
Lymphs Abs: 1.4 10*3/uL (ref 0.7–4.0)
MCH: 26.3 pg (ref 26.0–34.0)
MCHC: 31.4 g/dL (ref 30.0–36.0)
MCV: 83.8 fL (ref 80.0–100.0)
Monocytes Absolute: 1.2 10*3/uL — ABNORMAL HIGH (ref 0.1–1.0)
Monocytes Relative: 13 %
Neutro Abs: 6.2 10*3/uL (ref 1.7–7.7)
Neutrophils Relative %: 68 %
Platelets: 430 10*3/uL — ABNORMAL HIGH (ref 150–400)
RBC: 4.68 MIL/uL (ref 3.87–5.11)
RDW: 15.6 % — ABNORMAL HIGH (ref 11.5–15.5)
WBC: 9.2 10*3/uL (ref 4.0–10.5)
nRBC: 0 % (ref 0.0–0.2)

## 2019-12-12 LAB — GLUCOSE, CAPILLARY
Glucose-Capillary: 136 mg/dL — ABNORMAL HIGH (ref 70–99)
Glucose-Capillary: 153 mg/dL — ABNORMAL HIGH (ref 70–99)
Glucose-Capillary: 160 mg/dL — ABNORMAL HIGH (ref 70–99)

## 2019-12-12 LAB — BASIC METABOLIC PANEL
Anion gap: 11 (ref 5–15)
BUN: 19 mg/dL (ref 8–23)
CO2: 25 mmol/L (ref 22–32)
Calcium: 8 mg/dL — ABNORMAL LOW (ref 8.9–10.3)
Chloride: 95 mmol/L — ABNORMAL LOW (ref 98–111)
Creatinine, Ser: 0.78 mg/dL (ref 0.44–1.00)
GFR calc Af Amer: >60 mL/min (ref >60)
GFR calc non Af Amer: >60 mL/min (ref >60)
Glucose, Bld: 152 mg/dL — ABNORMAL HIGH (ref 70–99)
Potassium: 3.5 mmol/L (ref 3.5–5.1)
Sodium: 131 mmol/L — ABNORMAL LOW (ref 135–145)

## 2019-12-12 LAB — PROCALCITONIN: Procalcitonin: <0.1 ng/mL

## 2019-12-12 LAB — MAGNESIUM: Magnesium: 1.9 mg/dL (ref 1.7–2.4)

## 2019-12-12 MED ORDER — HYDRALAZINE HCL 25 MG PO TABS: 75.0000 mg | ORAL_TABLET | Freq: Three times a day (TID) | ORAL | Status: DC

## 2019-12-12 MED ORDER — CARVEDILOL 3.125 MG PO TABS
6.2500 mg | ORAL_TABLET | Freq: Two times a day (BID) | ORAL | Status: DC
  Administered 2019-12-12: 6.25 mg via ORAL
  Filled 2019-12-12: qty 2

## 2019-12-12 MED ORDER — LISINOPRIL 40 MG PO TABS: 40.0000 mg | ORAL_TABLET | Freq: Every day | ORAL | 1 refills | Status: DC

## 2019-12-12 MED ORDER — POTASSIUM CHLORIDE ER 10 MEQ PO TBCR: 10.0000 meq | EXTENDED_RELEASE_TABLET | Freq: Every day | ORAL | 1 refills | Status: DC

## 2019-12-12 MED ORDER — HYDRALAZINE HCL 25 MG PO TABS: 75.0000 mg | ORAL_TABLET | Freq: Three times a day (TID) | ORAL | 1 refills | Status: DC

## 2019-12-12 NOTE — Progress Notes (Signed)
Pt with bp consistently greater than sbp of 170. Pt is asymptomatic. PRN metorpolol was given via IV

## 2019-12-12 NOTE — Discharge Summary (Signed)
Physician Discharge Summary  [name] MBE:[id number] DOB: 1936/02/02 DOA: 12/10/2019  PCP: Janora Norlander, DO  Admit date: 12/10/2019 Discharge date: 12/12/2019  Admitted From:  Home  Disposition: Home   Recommendations for Outpatient Follow-up:  1. Follow up with PCP in 1 weeks 2. Establish care with cardiology [facility] as soon as possible  3. Please obtain BMP in 1 week to follow lytes  Discharge Condition: STABLE   CODE STATUS: FULL    Brief Hospitalization Summary: Please see all hospital notes, images, labs for full details of the hospitalization. Brief Admission History:  84 y.o.femalewith medical history significant ofosteoarthritis, degenerative disc disease of the lumbosacral spine, type 2 diabetes, history of bilateral distal radial fractures, GERD, glaucoma, impaired hearing of left ear, hypertension, PONVwho is coming to the emergency department due to progressively worse dyspnea for the past 2 days associated with orthopneain the evening.This did not improve after the patient tried propping herself up with multiple pillows. She also has been having an occasional dry cough, headachesand fatigue. She denies chest pain, palpitations, diaphoresis, recent lower extremity pitting edema. No fever, chills, rhinorrhea, sore throat, wheezing or hemoptysis.   Assessment & Plan:   Principal Problem:   Hypertensive emergency Active Problems:   Hyperlipidemia associated with type 2 diabetes mellitus   Glaucoma   GERD (gastroesophageal reflux disease)   Hypocalcemia   Type 2 diabetes mellitus   Abnormal EKG   Hypomagnesemia   Abnormal chest x-ray   Hypertensive urgency  1. Hypertensive urgency - RESOLVED NOW.  BP remains difficult to control but much improved, we have been able to get her off the IV nitroglycerin infusion.  MRA renal arteries ordered and was negative for any acute findings.  2. Uncontrolled hypertension - better controlled but suboptimal,  added  hydralazine 75 mg TID.  Increased lisinopril to 40 mg.  2D echo with EF 55-60% and grade 2 DD.  3. Chronic diastolic CHF - Lasix IV given in hospital with good results.  Resume home HCTZ 25 mg at discharge with potassium supplement.   4. Hypomagnesemia - repleted.    5. Type 2 DM - continue SSI coverage.  Resume home oral diabetes medications.  6. Pneumonitis - negative procalcitonin.  Pt diuresed with lasix and feeling much better.    7. HLD - stable.  IMPRESSIONS  12/11/19 1. Left ventricular ejection fraction, by estimation, is 55 to 60%. The  left ventricle has normal function. The left ventricle has no regional  wall motion abnormalities. There is moderate left ventricular hypertrophy.  Left ventricular diastolic  parameters are consistent with Grade II diastolic dysfunction  (pseudonormalization). Elevated left atrial pressure.  2. Right ventricular systolic function is normal. The right ventricular  size is normal. There is mildly elevated pulmonary artery systolic  pressure.  3. Left atrial size was severely dilated.  4. The mitral valve is normal in structure. Mild mitral valve  regurgitation. Mild mitral stenosis.  5. The aortic valve is tricuspid. Aortic valve regurgitation is not  visualized. No aortic stenosis is present.  6. The inferior vena cava is normal in size with greater than 50%  respiratory variability, suggesting right atrial pressure of 3 mmHg.   DVT prophylaxis:  lovenox  Code Status: full  Family Communication: husband at bedside  Disposition:   Status is: Inpatient  Remains inpatient appropriate because:Hemodynamically unstable, Persistent severe electrolyte disturbances and Inpatient level of care appropriate due to severity of illness  Dispo: The patient is from: Home  Anticipated d/c  is to: Home   Discharge Diagnoses:  Principal Problem:   Hypertensive emergency Active Problems:   Hyperlipidemia associated with  type 2 diabetes mellitus (HCC)   Glaucoma   GERD (gastroesophageal reflux disease)   Hypocalcemia   Type 2 diabetes mellitus (HCC)   Abnormal EKG   Hypomagnesemia   Abnormal chest x-ray   Hypertensive urgency   Congestive heart failure (HCC)   SOB (shortness of breath)   Discharge Instructions: Discharge Instructions    Ambulatory referral to Cardiology   Complete by: As directed      Allergies as of 12/12/2019      Reactions   Codeine Nausea Only      Medication List    TAKE these medications   amLODipine 10 MG tablet Commonly known as: NORVASC Take 1 tablet (10 mg total) by mouth daily.   aspirin 81 MG tablet Take 81 mg by mouth daily.   atorvastatin 20 MG tablet Commonly known as: LIPITOR Take 1 tablet (20 mg total) by mouth daily.   calcium-vitamin D 500-200 MG-UNIT tablet Commonly known as: OSCAL WITH D Take 1 tablet by mouth.   fluticasone 50 MCG/ACT nasal spray Commonly known as: FLONASE Place into both nostrils daily.   glipiZIDE 10 MG tablet Commonly known as: GLUCOTROL Take 1 tablet (10 mg total) by mouth daily before breakfast.   glucose blood test strip Test BG up to 2 times daily Dx (E 11.65)   hydrALAZINE 25 MG tablet Commonly known as: APRESOLINE Take 3 tablets (75 mg total) by mouth every 8 (eight) hours.   hydrochlorothiazide 25 MG tablet Commonly known as: HYDRODIURIL Take 1 tablet by mouth once daily   ibandronate 150 MG tablet Commonly known as: BONIVA Take 150 mg by mouth every 30 (thirty) days. Take in the morning with a full glass of water, on an empty stomach, and do not take anything else by mouth or lie down for the next 30 min.   insulin glargine 100 UNIT/ML injection Commonly known as: LANTUS Inject 16 Units into the skin at bedtime.   Janumet 50-1000 MG tablet Generic drug: sitaGLIPtin-metformin Take 1 tablet by mouth 2 (two) times daily with a meal.   latanoprost 0.005 % ophthalmic solution Commonly known as:  XALATAN 1 drop at bedtime.   lisinopril 40 MG tablet Commonly known as: ZESTRIL Take 1 tablet (40 mg total) by mouth daily. What changed:   medication strength  how much to take   metoprolol tartrate 50 MG tablet Commonly known as: LOPRESSOR Take 1 tablet (50 mg total) by mouth 2 (two) times daily.   multivitamin-iron-minerals-folic acid chewable tablet Chew 1 tablet by mouth daily.   potassium chloride 10 MEQ tablet Commonly known as: KLOR-CON Take 1 tablet (10 mEq total) by mouth daily. Start taking on: December 13, 2019   timolol 0.5 % ophthalmic solution Commonly known as: TIMOPTIC INSTILL 1 DROP INTO EACH EYE ONCE DAILY   Zegerid 20-1100 MG Caps capsule Generic drug: Omeprazole-Sodium Bicarbonate Take 1 capsule by mouth daily before breakfast.       Follow-up Information    [name] M, DO. Schedule an appointment as soon as possible for a visit in 1 week(s).   Specialty: Family Medicine Contact information: [place] [address] [place]. Schedule an appointment as soon as possible for a visit in 2 week(s).   Specialty: Cardiology Contact information: [place]  [place] [place] [phone]             Allergies  Allergen Reactions  . Codeine Nausea Only   Allergies as of 12/12/2019      Reactions   Codeine Nausea Only      Medication List    TAKE these medications   amLODipine 10 MG tablet Commonly known as: NORVASC Take 1 tablet (10 mg total) by mouth daily.   aspirin 81 MG tablet Take 81 mg by mouth daily.   atorvastatin 20 MG tablet Commonly known as: LIPITOR Take 1 tablet (20 mg total) by mouth daily.   calcium-vitamin D 500-200 MG-UNIT tablet Commonly known as: OSCAL WITH D Take 1 tablet by mouth.   fluticasone 50 MCG/ACT nasal spray Commonly known as: FLONASE Place into both nostrils daily.   glipiZIDE 10 MG  tablet Commonly known as: GLUCOTROL Take 1 tablet (10 mg total) by mouth daily before breakfast.   glucose blood test strip Test BG up to 2 times daily Dx (E 11.65)   hydrALAZINE 25 MG tablet Commonly known as: APRESOLINE Take 3 tablets (75 mg total) by mouth every 8 (eight) hours.   hydrochlorothiazide 25 MG tablet Commonly known as: HYDRODIURIL Take 1 tablet by mouth once daily   ibandronate 150 MG tablet Commonly known as: BONIVA Take 150 mg by mouth every 30 (thirty) days. Take in the morning with a full glass of water, on an empty stomach, and do not take anything else by mouth or lie down for the next 30 min.   insulin glargine 100 UNIT/ML injection Commonly known as: LANTUS Inject 16 Units into the skin at bedtime.   Janumet 50-1000 MG tablet Generic drug: sitaGLIPtin-metformin Take 1 tablet by mouth 2 (two) times daily with a meal.   latanoprost 0.005 % ophthalmic solution Commonly known as: XALATAN 1 drop at bedtime.   lisinopril 40 MG tablet Commonly known as: ZESTRIL Take 1 tablet (40 mg total) by mouth daily. What changed:   medication strength  how much to take   metoprolol tartrate 50 MG tablet Commonly known as: LOPRESSOR Take 1 tablet (50 mg total) by mouth 2 (two) times daily.   multivitamin-iron-minerals-folic acid chewable tablet Chew 1 tablet by mouth daily.   potassium chloride 10 MEQ tablet Commonly known as: KLOR-CON Take 1 tablet (10 mEq total) by mouth daily. Start taking on: December 13, 2019   timolol 0.5 % ophthalmic solution Commonly known as: TIMOPTIC INSTILL 1 DROP INTO EACH EYE ONCE DAILY   Zegerid 20-1100 MG Caps capsule Generic drug: Omeprazole-Sodium Bicarbonate Take 1 capsule by mouth daily before breakfast.       Procedures/Studies: MR ANGIO ABDOMEN W WO CONTRAST  Result Date: 12/11/2019 CLINICAL DATA:  Uncontrolled hypertension, evaluate for renal artery stenosis EXAM: MRA ABDOMEN AND PELVIS WITH CONTRAST TECHNIQUE:  Multiplanar, multiecho pulse sequences of the abdomen and pelvis were obtained with intravenous contrast. Angiographic images of abdomen and pelvis were obtained using MRA technique with intravenous contrast. CONTRAST:  29mL GADAVIST GADOBUTROL 1 MMOL/ML IV SOLN COMPARISON:  None. FINDINGS: ARTERIAL Aorta: Mildly tortuous with moderate atheromatous irregularity. No aneurysm, dissection, or stenosis. Celiac axis:          Patent Superior mesenteric:  Patent, with classic  distal branch anatomy. Left renal: Single, no evidence of proximal high-grade stenosis. Right renal: Single, no evidence of proximal high-grade stenosis. Inferior mesenteric:  Diminutive, patent Left iliac: Unremarkable. Distal external iliac not visualized. Right iliac:  Patent VENOUS Dedicated venous phase imaging not obtained. Patent portal and renal veins are noted. NONVASCULAR Lower chest:  No acute findings. Hepatobiliary: Negative limited evaluation, incompletely visualized Pancreas: Negative limited evaluation Spleen: Normal in size Adrenals/Urinary Tract: No masses identified. No evidence of hydronephrosis. Stomach/Bowel: Incompletely visualized. No evidence of obstruction, inflammatory process, or abnormal fluid collections. Lymphatic: No pathologically enlarged lymph nodes identified. Reproductive: Previous hysterectomy Other: No ascites. Musculoskeletal: Orthopedic hardware across the right femoral neck. IMPRESSION: 1. No evidence of significant proximal renal artery stenosis or other lesion to suggest a renovascular component of the hypertension. 2.  Aortic Atherosclerosis (ICD10-I70.0). Electronically Signed   By: Lucrezia Europe M.D.   On: 12/11/2019 17:03   DG Chest Port 1 View  Result Date: 12/10/2019 CLINICAL DATA:  Shortness of breath for 2 days, worse with lying flat. History of hypertension and diabetes. EXAM: PORTABLE CHEST 1 VIEW COMPARISON:  06/24/2005 FINDINGS: Shallow inspiration. Heart size is normal. Diffuse nodular  and interstitial infiltrative pattern to the lungs may represent diffuse pneumonitis. Peribronchial thickening. Blunting of the left costophrenic angle suggests a small effusion. Calcification of the aorta. Degenerative changes in the shoulders. IMPRESSION: Diffuse nodular and interstitial infiltrative pattern to the lungs suggesting diffuse pneumonitis. Small left pleural effusion. Electronically Signed   By: Lucienne Capers M.D.   On: 12/10/2019 23:08   ECHOCARDIOGRAM COMPLETE  Result Date: 12/11/2019    ECHOCARDIOGRAM REPORT   Patient Name:   [name] Date of Exam: 12/11/2019 Medical Rec #:  [id number]     Height:       64.0 in Accession #:    [id number]    Weight:       142.0 lb Date of Birth:  12-24-1935    BSA:          1.691 m Patient Age:    84 years      BP:           160/62 mmHg Patient Gender: F             HR:           74 bpm. Exam Location:  [facility] Procedure: 2D Echo Indications:    Dyspnea 786.09 / R06.00                 Abnormal ECG 794.31 / R94.31  History:        Patient has no prior history of Echocardiogram examinations.                 Abnormal ECG; Risk Factors:Dyslipidemia, Hypertension and                 Diabetes.  Sonographer:    Leavy Cella RDCS (AE) Referring Phys: [id number] DAVID MANUEL [place]  1. Left ventricular ejection fraction, by estimation, is 55 to 60%. The left ventricle has normal function. The left ventricle has no regional wall motion abnormalities. There is moderate left ventricular hypertrophy. Left ventricular diastolic parameters are consistent with Grade II diastolic dysfunction (pseudonormalization). Elevated left atrial pressure.  2. Right ventricular systolic function is normal. The right ventricular size is normal. There is mildly elevated pulmonary artery systolic pressure.  3. Left atrial size was severely dilated.  4. The mitral valve is normal in structure. Mild mitral valve regurgitation. Mild mitral stenosis.  5. The aortic valve is  tricuspid. Aortic valve regurgitation is not visualized. No aortic stenosis is present.  6. The inferior vena cava is normal in size with greater than  50% respiratory variability, suggesting right atrial pressure of 3 mmHg. FINDINGS  Left Ventricle: Left ventricular ejection fraction, by estimation, is 55 to 60%. The left ventricle has normal function. The left ventricle has no regional wall motion abnormalities. The left ventricular internal cavity size was normal in size. There is  moderate left ventricular hypertrophy. Left ventricular diastolic parameters are consistent with Grade II diastolic dysfunction (pseudonormalization). Elevated left atrial pressure. Right Ventricle: The right ventricular size is normal. No increase in right ventricular wall thickness. Right ventricular systolic function is normal. There is mildly elevated pulmonary artery systolic pressure. The tricuspid regurgitant velocity is 2.85  m/s, and with an assumed right atrial pressure of 10 mmHg, the estimated right ventricular systolic pressure is 46.5 mmHg. Left Atrium: Left atrial size was severely dilated. Right Atrium: Right atrial size was normal in size. Pericardium: There is no evidence of pericardial effusion. Mitral Valve: The mitral valve is normal in structure. There is mild thickening of the mitral valve leaflet(s). There is mild calcification of the mitral valve leaflet(s). Severe mitral annular calcification. Mild mitral valve regurgitation. Mild mitral valve stenosis. MV peak gradient, 7.0 mmHg. The mean mitral valve gradient is 3.0 mmHg. Tricuspid Valve: The tricuspid valve is normal in structure. Tricuspid valve regurgitation is trivial. No evidence of tricuspid stenosis. Aortic Valve: The aortic valve is tricuspid. . There is mild thickening and mild calcification of the aortic valve. Aortic valve regurgitation is not visualized. No aortic stenosis is present. There is mild thickening of the aortic valve. There is mild  calcification of the aortic valve. Aortic valve mean gradient measures 4.8 mmHg. Aortic valve peak gradient measures 9.5 mmHg. Aortic valve area, by VTI measures 2.41 cm. Pulmonic Valve: The pulmonic valve was not well visualized. Pulmonic valve regurgitation is mild. No evidence of pulmonic stenosis. Aorta: The aortic root is normal in size and structure. Pulmonary Artery: Indeterminant PASP, inadequate TR jet. Venous: The inferior vena cava is normal in size with greater than 50% respiratory variability, suggesting right atrial pressure of 3 mmHg. IAS/Shunts: The interatrial septum was not well visualized.  LEFT VENTRICLE PLAX 2D LVIDd:         3.38 cm  Diastology LVIDs:         2.45 cm  LV e' lateral:   4.20 cm/s LV PW:         1.47 cm  LV E/e' lateral: 29.3 LV IVS:        1.38 cm  LV e' medial:    3.68 cm/s LVOT diam:     1.90 cm  LV E/e' medial:  33.4 LV SV:         79 LV SV Index:   47 LVOT Area:     2.84 cm  RIGHT VENTRICLE RV S prime:     16.80 cm/s TAPSE (M-mode): 2.3 cm LEFT ATRIUM              Index       RIGHT ATRIUM           Index LA diam:        4.50 cm  2.66 cm/m  RA Area:     14.60 cm LA Vol (A2C):   109.0 ml 64.45 ml/m RA Volume:   41.50 ml  24.54 ml/m LA Vol (A4C):   79.8 ml  47.18 ml/m LA Biplane Vol: 100.0 ml 59.13 ml/m  AORTIC VALVE AV Area (Vmax):    2.29 cm AV Area (Vmean):   2.15 cm AV  Area (VTI):     2.41 cm AV Vmax:           153.76 cm/s AV Vmean:          103.653 cm/s AV VTI:            0.328 m AV Peak Grad:      9.5 mmHg AV Mean Grad:      4.8 mmHg LVOT Vmax:         124.25 cm/s LVOT Vmean:        78.629 cm/s LVOT VTI:          0.280 m LVOT/AV VTI ratio: 0.85  AORTA Ao Root diam: 2.60 cm MITRAL VALVE                TRICUSPID VALVE MV Area (PHT): 4.49 cm     TR Peak grad:   32.5 mmHg MV Peak grad:  7.0 mmHg     TR Vmax:        285.00 cm/s MV Mean grad:  3.0 mmHg MV Vmax:       1.32 m/s     SHUNTS MV Vmean:      74.4 cm/s    Systemic VTI:  0.28 m MV Decel Time: 169 msec      Systemic Diam: 1.90 cm MR Peak grad: 135.5 mmHg MR Vmax:      582.00 cm/s MV E velocity: 123.00 cm/s MV A velocity: 101.00 cm/s MV E/A ratio:  1.22 Carlyle Dolly MD Electronically signed by Carlyle Dolly MD Signature Date/Time: 12/11/2019/3:24:19 PM    Final       Subjective: Pt says she feels a lot better today.  No shortness of breath and no chest pain symptoms.     Discharge Exam: Vitals:   12/12/19 0300 12/12/19 0400  BP: (!) 176/52   Pulse: 78   Resp: 20   Temp:  98.1 F (36.7 C)  SpO2: 92%    Vitals:   12/12/19 0200 12/12/19 0300 12/12/19 0400 12/12/19 0500  BP: (!) 121/45 (!) 176/52    Pulse: 65 78    Resp: 18 20    Temp:   98.1 F (36.7 C)   TempSrc:   Oral   SpO2: 92% 92%    Weight:    63 kg  Height:       General: Pt is alert, awake, not in acute distress Cardiovascular: normal S1/S2 +, no rubs, no gallops Respiratory: CTA bilaterally, no wheezing, no rhonchi Abdominal: Soft, NT, ND, bowel sounds + Extremities: no edema, no cyanosis   The results of significant diagnostics from this hospitalization (including imaging, microbiology, ancillary and laboratory) are listed below for reference.     Microbiology: Recent Results (from the past 240 hour(s))  Respiratory Panel by RT PCR (Flu A&B, Covid) - Nasopharyngeal Swab     Status: None   Collection Time: 12/10/19 11:09 PM   Specimen: Nasopharyngeal Swab  Result Value Ref Range Status   SARS Coronavirus 2 by RT PCR NEGATIVE NEGATIVE Final    Comment: (NOTE) SARS-CoV-2 target nucleic acids are NOT DETECTED.  The SARS-CoV-2 RNA is generally detectable in upper respiratoy specimens during the acute phase of infection. The lowest concentration of SARS-CoV-2 viral copies this assay can detect is 131 copies/mL. A negative result does not preclude SARS-Cov-2 infection and should not be used as the sole basis for treatment or other patient management decisions. A negative result may occur with  improper specimen  collection/handling, submission of specimen other  than nasopharyngeal swab, presence of viral mutation(s) within the areas targeted by this assay, and inadequate number of viral copies (<131 copies/mL). A negative result must be combined with clinical observations, patient history, and epidemiological information. The expected result is Negative.  Fact Sheet for Patients:  PinkCheek.be  Fact Sheet for Healthcare Providers:  GravelBags.it  This test is no t yet approved or cleared by the [place] FDA and  has been authorized for detection and/or diagnosis of SARS-CoV-2 by FDA under an Emergency Use Authorization (EUA). This EUA will remain  in effect (meaning this test can be used) for the duration of the COVID-19 declaration under Section 564(b)(1) of the Act, 21 U.S.C. section 360bbb-3(b)(1), unless the authorization is terminated or revoked sooner.     Influenza A by PCR NEGATIVE NEGATIVE Final   Influenza B by PCR NEGATIVE NEGATIVE Final    Comment: (NOTE) The Xpert Xpress SARS-CoV-2/FLU/RSV assay is intended as an aid in  the diagnosis of influenza from Nasopharyngeal swab specimens and  should not be used as a sole basis for treatment. Nasal washings and  aspirates are unacceptable for Xpert Xpress SARS-CoV-2/FLU/RSV  testing.  Fact Sheet for Patients: PinkCheek.be  Fact Sheet for Healthcare Providers: GravelBags.it  This test is not yet approved or cleared by the [place] FDA and  has been authorized for detection and/or diagnosis of SARS-CoV-2 by  FDA under an Emergency Use Authorization (EUA). This EUA will remain  in effect (meaning this test can be used) for the duration of the  Covid-19 declaration under Section 564(b)(1) of the Act, 21  U.S.C. section 360bbb-3(b)(1), unless the authorization is  terminated or revoked. Performed at [facility], [address] [place], [place] [address]      Labs: BNP (last 3 results) Recent Labs    12/10/19 2240 12/12/19 0455  BNP 379.0* 390.3*   Basic Metabolic Panel: Recent Labs  Lab 12/10/19 2240 12/11/19 0027 12/12/19 0455  NA 131* 133* 131*  K 3.6 4.3 3.5  CL 97* 95* 95*  CO2 21* 21* 25  GLUCOSE 178* 159* 152*  BUN 16 16 19   CREATININE 0.69 0.68 0.78  CALCIUM 8.5* 8.8* 8.0*  MG  --  1.5* 1.9  PHOS  --  3.5  --    Liver Function Tests: Recent Labs  Lab 12/10/19 2240  AST 19  ALT 18  ALKPHOS 54  BILITOT 0.6  PROT 7.3  ALBUMIN 3.7   No results for input(s): LIPASE, AMYLASE in the last 168 hours. No results for input(s): AMMONIA in the last 168 hours. CBC: Recent Labs  Lab 12/10/19 2240 12/11/19 0027 12/12/19 0455  WBC 16.4* 15.3* 9.2  NEUTROABS  --  13.5* 6.2  HGB 12.2 12.2 12.3  HCT 39.1 39.8 39.2  MCV 85.2 87.1 83.8  PLT 452* 414* 430*   Cardiac Enzymes: No results for input(s): CKTOTAL, CKMB, CKMBINDEX, TROPONINI in the last 168 hours. BNP: Invalid input(s): POCBNP CBG: Recent Labs  Lab 12/11/19 1653 12/11/19 1957 12/12/19 0001 12/12/19 0427 12/12/19 0726  GLUCAP 212* 174* 153* 136* 160*   D-Dimer No results for input(s): DDIMER in the last 72 hours. Hgb A1c Recent Labs    12/11/19 0027  HGBA1C 6.7*   Lipid Profile No results for input(s): CHOL, HDL, LDLCALC, TRIG, CHOLHDL, LDLDIRECT in the last 72 hours. Thyroid function studies No results for input(s): TSH, T4TOTAL, T3FREE, THYROIDAB in the last 72 hours.  Invalid input(s): FREET3 Anemia work up No results for input(s):  VITAMINB12, FOLATE, FERRITIN, TIBC, IRON, RETICCTPCT in the last 72 hours. Urinalysis    Component Value Date/Time   APPEARANCEUR Clear 09/29/2019 0906   GLUCOSEU Negative 09/29/2019 0906   BILIRUBINUR Negative 09/29/2019 0906   PROTEINUR 2+ (A) 09/29/2019 0906   NITRITE Negative 09/29/2019 0906   LEUKOCYTESUR 2+ (A) 09/29/2019 0906   Sepsis  Labs Invalid input(s): PROCALCITONIN,  WBC,  LACTICIDVEN Microbiology Recent Results (from the past 240 hour(s))  Respiratory Panel by RT PCR (Flu A&B, Covid) - Nasopharyngeal Swab     Status: None   Collection Time: 12/10/19 11:09 PM   Specimen: Nasopharyngeal Swab  Result Value Ref Range Status   SARS Coronavirus 2 by RT PCR NEGATIVE NEGATIVE Final    Comment: (NOTE) SARS-CoV-2 target nucleic acids are NOT DETECTED.  The SARS-CoV-2 RNA is generally detectable in upper respiratoy specimens during the acute phase of infection. The lowest concentration of SARS-CoV-2 viral copies this assay can detect is 131 copies/mL. A negative result does not preclude SARS-Cov-2 infection and should not be used as the sole basis for treatment or other patient management decisions. A negative result may occur with  improper specimen collection/handling, submission of specimen other than nasopharyngeal swab, presence of viral mutation(s) within the areas targeted by this assay, and inadequate number of viral copies (<131 copies/mL). A negative result must be combined with clinical observations, patient history, and epidemiological information. The expected result is Negative.  Fact Sheet for Patients:  PinkCheek.be  Fact Sheet for Healthcare Providers:  GravelBags.it  This test is no t yet approved or cleared by the [place] FDA and  has been authorized for detection and/or diagnosis of SARS-CoV-2 by FDA under an Emergency Use Authorization (EUA). This EUA will remain  in effect (meaning this test can be used) for the duration of the COVID-19 declaration under Section 564(b)(1) of the Act, 21 U.S.C. section 360bbb-3(b)(1), unless the authorization is terminated or revoked sooner.     Influenza A by PCR NEGATIVE NEGATIVE Final   Influenza B by PCR NEGATIVE NEGATIVE Final    Comment: (NOTE) The Xpert Xpress SARS-CoV-2/FLU/RSV assay  is intended as an aid in  the diagnosis of influenza from Nasopharyngeal swab specimens and  should not be used as a sole basis for treatment. Nasal washings and  aspirates are unacceptable for Xpert Xpress SARS-CoV-2/FLU/RSV  testing.  Fact Sheet for Patients: PinkCheek.be  Fact Sheet for Healthcare Providers: GravelBags.it  This test is not yet approved or cleared by the [place] FDA and  has been authorized for detection and/or diagnosis of SARS-CoV-2 by  FDA under an Emergency Use Authorization (EUA). This EUA will remain  in effect (meaning this test can be used) for the duration of the  Covid-19 declaration under Section 564(b)(1) of the Act, 21  U.S.C. section 360bbb-3(b)(1), unless the authorization is  terminated or revoked. Performed at [facility], [address], [place], [place] [address]    Time coordinating discharge: 32 mins   SIGNED:  Irwin Brakeman, MD  [facility] 12/12/2019, 11:23 AM How to contact the [facility] Attending or Consulting provider [place] or covering provider during after hours [place], for this patient?  1. Check the care team in [facility] and look for a) attending/consulting [facility] provider listed and b) the [facility] team listed 2. Log into [email or web address] and use [facility] universal password to access. If you do not have the password, please contact the hospital operator. 3. Locate the [facility] provider you are looking for  under [facility] and page to a number that you can be directly reached. 4. If you still have difficulty reaching the provider, please page the [facility] (Director on Call) for the Hospitalists listed on amion for assistance.

## 2019-12-12 NOTE — Telephone Encounter (Signed)
TRANSITIONAL CARE MANAGEMENT TELEPHONE OUTREACH NOTE   Contact Date: 12/12/2019 Contacted By: Zannie Cove, lpn      DISCHARGE INFORMATION Date of Discharge:12/12/19 Discharge Facility: [facility] Discharge Diagnosis: hypertensive emergency   Outpatient Follow Up Recommendations (copied from discharge summary) Recommendations for Outpatient Follow-up:  1. Follow up with PCP in 1 weeks 2. Establish care with cardiology [place] as soon as possible  3. Please obtain BMP in 1 week to follow lytes  [name] is a female primary care patient of Janora Norlander, DO. An outgoing telephone call was made today and I spoke with patient herself.  [name] condition(s) and treatment(s) were discussed. An opportunity to ask questions was provided and all were answered or forwarded as appropriate.    ACTIVITIES OF DAILY LIVING  [name] lives alone and she can perform ADLs independently. her primary caregiver is herself . she is able to depend on her primary caregiver(s) for consistent help. Transportation to appointments, to pick up medications, and to run errands is not a problem.  (Consider referral to [facility] if transportation or a consistent caregiver is a problem)   Fall Risk Fall Risk  09/01/2019 07/18/2019  Falls in the past year? 0 0  Number falls in past yr: - 0  Injury with Fall? - 0  Risk for fall due to : - No Fall Risks  Follow up - Falls evaluation completed    low [place] Modifications/Assistive Devices Wheelchair: No Cane: yes Ramp: No Bedside Toilet: No Hospital Bed:  No Other: walker    [place] she is not receiving home health NO services.     MEDICATION RECONCILIATION  [name] has been able to pick-up all prescribed discharge medications from the pharmacy.   A post discharge medication reconciliation was performed and the complete medication list was reviewed with the patient/caregiver and is current as of  12/12/2019. Changes highlighted below.  Discontinued Medications None   Current Medication List Allergies as of 12/12/2019      Reactions   Codeine Nausea Only      Medication List       Accurate as of December 12, 2019  2:37 PM. If you have any questions, ask your nurse or doctor.        amLODipine 10 MG tablet Commonly known as: NORVASC Take 1 tablet (10 mg total) by mouth daily.   aspirin 81 MG tablet Take 81 mg by mouth daily.   atorvastatin 20 MG tablet Commonly known as: LIPITOR Take 1 tablet (20 mg total) by mouth daily.   calcium-vitamin D 500-200 MG-UNIT tablet Commonly known as: OSCAL WITH D Take 1 tablet by mouth.   fluticasone 50 MCG/ACT nasal spray Commonly known as: FLONASE Place into both nostrils daily.   glipiZIDE 10 MG tablet Commonly known as: GLUCOTROL Take 1 tablet (10 mg total) by mouth daily before breakfast.   glucose blood test strip Test BG up to 2 times daily Dx (E 11.65)   hydrALAZINE 25 MG tablet Commonly known as: APRESOLINE Take 3 tablets (75 mg total) by mouth every 8 (eight) hours.   hydrochlorothiazide 25 MG tablet Commonly known as: HYDRODIURIL Take 1 tablet by mouth once daily   ibandronate 150 MG tablet Commonly known as: BONIVA Take 150 mg by mouth every 30 (thirty) days. Take in the morning with a full glass of water, on an empty stomach, and do not take anything else by  mouth or lie down for the next 30 min.   insulin glargine 100 UNIT/ML injection Commonly known as: LANTUS Inject 16 Units into the skin at bedtime.   Janumet 50-1000 MG tablet Generic drug: sitaGLIPtin-metformin Take 1 tablet by mouth 2 (two) times daily with a meal.   latanoprost 0.005 % ophthalmic solution Commonly known as: XALATAN 1 drop at bedtime.   lisinopril 40 MG tablet Commonly known as: ZESTRIL Take 1 tablet (40 mg total) by mouth daily.   metoprolol tartrate 50 MG tablet Commonly known as: LOPRESSOR Take 1 tablet (50 mg total) by  mouth 2 (two) times daily.   multivitamin-iron-minerals-folic acid chewable tablet Chew 1 tablet by mouth daily.   potassium chloride 10 MEQ tablet Commonly known as: KLOR-CON Take 1 tablet (10 mEq total) by mouth daily. Start taking on: December 13, 2019   timolol 0.5 % ophthalmic solution Commonly known as: TIMOPTIC INSTILL 1 DROP INTO EACH EYE ONCE DAILY   Zegerid 20-1100 MG Caps capsule Generic drug: Omeprazole-Sodium Bicarbonate Take 1 capsule by mouth daily before breakfast.        PATIENT EDUCATION & FOLLOW-UP PLAN  An appointment for Transitional Care Management is scheduled with Jac Canavan, FNP  on 12/19/2019 at 200pm.  Take all medications as prescribed  Contact our office by calling [phone] if you have any questions or concerns

## 2019-12-12 NOTE — Progress Notes (Signed)
Discharge instructions reviewed with patient and patient's husband. Both verbalized understanding of instructions. Patient discharged home with husband in stable condition.   

## 2019-12-15 ENCOUNTER — Telehealth: Payer: Self-pay | Admitting: Family Medicine

## 2019-12-15 MED ORDER — INSULIN GLARGINE 100 UNIT/ML ~~LOC~~ SOLN: 16.0000 [IU] | Freq: Every day | SUBCUTANEOUS | 5 refills | Status: DC

## 2019-12-15 NOTE — Telephone Encounter (Signed)
I do see Lantus on her med list but I don't see that we have been writing it for her. Ok to refill?

## 2019-12-15 NOTE — Progress Notes (Signed)
CARDIOLOGY CONSULT NOTE       Patient ID: [name] MRN: [id number] DOB/AGE: [birth year] 82 y.o.  Admit date: 12/11/19  Referring Physician: Lajuana Ripple Primary Physician: Janora Norlander, DO Primary Cardiologist: New Reason for Consultation: Diastolic CHF  Active Problems:   * No active hospital problems. *   HPI:  84 y.o. referred by Dr Lajuana Ripple for diastolic CHF Admitted to [facility] 12/11/19 with progressive dyspnea 82 hours 48 hours associated with orthopnea Associated with dry cough, headaches and fatigue No chest pain or edema BP very elevated on presentation systolic 767 mmHg she was Rx with iv nitro and lasix WBC 16.4 troponin negative x 2 BNP only 379 COVID/Influenza negative CXR showed diffuse infiltrates consistent with pneumonitis   She is diabetic No history of cardiac issues Echo done 12/11/19 reviewed EF 55-60% moderate LVH grade 2 diastolic CHF mild MR MRA with no RAS and K 3.6 Cr 0.78  Since d/c doing well. Compliant with meds BP excellent No dyspnea sleeping flat She lives alone but has 4 sisters and a boyfriend that rides her around She walks daily with no dyspnea, chest pian palpitations or syncope   ROS All other systems reviewed and negative except as noted above  Past Medical History:  Diagnosis Date  . Arthritis    OA  . DDD (degenerative disc disease), lumbosacral   . Diabetes mellitus without complication ([place])   . Distal radius fracture    bilateral  . GERD (gastroesophageal reflux disease)   . Glaucoma   . HOH (hard of hearing)    left ear  . Hypertension   . PONV (postoperative nausea and vomiting)     Family History  Problem Relation Age of Onset  . Heart disease Mother   . Heart disease Father     Social History   Socioeconomic History  . Marital status: Widowed    Spouse name: Not on file  . Number of children: 0  . Years of education: 7  . Highest education level: 7th grade  Occupational History  . Not on file  Tobacco Use  . Smoking  status: Never Smoker  . Smokeless tobacco: Never Used  Vaping Use  . Vaping Use: Never used  Substance and Sexual Activity  . Alcohol use: No  . Drug use: No  . Sexual activity: Not Currently  Other Topics Concern  . Not on file  Social History Narrative  . Not on file   Social Determinants of Health   Financial Resource Strain: Low Risk   . Difficulty of Paying Living Expenses: Not hard at all  Food Insecurity: No Food Insecurity  . Worried About Charity fundraiser in the Last Year: Never true  . Ran Out of Food in the Last Year: Never true  Transportation Needs: No Transportation Needs  . Lack of Transportation (Medical): No  . Lack of Transportation (Non-Medical): No  Physical Activity: Sufficiently Active  . Days of Exercise per Week: 5 days  . Minutes of Exercise per Session: 60 min  Stress: No Stress Concern Present  . Feeling of Stress : Not at all  Social Connections: Moderately Integrated  . Frequency of Communication with Friends and Family: More than three times a week  . Frequency of Social Gatherings with Friends and Family: More than three times a week  . Attends Religious Services: More than 4 times per year  . Active Member of Clubs or Organizations: Yes  . Attends Archivist Meetings: More than 4 times  per year  . Marital Status: Widowed  Intimate Partner Violence: Not At Risk  . Fear of Current or Ex-Partner: No  . Emotionally Abused: No  . Physically Abused: No  . Sexually Abused: No    Past Surgical History:  Procedure Laterality Date  . ABDOMINAL HYSTERECTOMY    . BREAST BIOPSY    . BREAST EXCISIONAL BIOPSY Left    benign  . BREAST SURGERY     left milk duct   . EYE SURGERY     cataract  . FRACTURE SURGERY Left    hip fracture  . OPEN REDUCTION INTERNAL FIXATION (ORIF) DISTAL RADIAL FRACTURE Bilateral 05/24/2015   Procedure: OPEN REDUCTION INTERNAL FIXATION (ORIF) BILATERAL DISTAL RADIAL FRACTURE VOLAR PLATE;  Surgeon: Daryll Brod,  MD;  Location: [facility];  Service: Orthopedics;  Laterality: Bilateral;      Current Outpatient Medications:  .  amLODipine (NORVASC) 10 MG tablet, Take 1 tablet (10 mg total) by mouth daily., Disp: 90 tablet, Rfl: 3 .  aspirin 81 MG tablet, Take 81 mg by mouth daily., Disp: , Rfl:  .  atorvastatin (LIPITOR) 20 MG tablet, Take 1 tablet (20 mg total) by mouth daily., Disp: 90 tablet, Rfl: 3 .  calcium-vitamin D (OSCAL WITH D) 500-200 MG-UNIT tablet, Take 1 tablet by mouth., Disp: , Rfl:  .  fluticasone (FLONASE) 50 MCG/ACT nasal spray, Place into both nostrils daily., Disp: , Rfl:  .  glipiZIDE (GLUCOTROL) 10 MG tablet, Take 1 tablet (10 mg total) by mouth daily before breakfast., Disp: 90 tablet, Rfl: 1 .  glucose blood test strip, Test BG up to 2 times daily Dx (E 11.65), Disp: 100 each, Rfl: 12 .  hydrALAZINE (APRESOLINE) 25 MG tablet, Take 3 tablets (75 mg total) by mouth every 8 (eight) hours., Disp: 90 tablet, Rfl: 1 .  hydrochlorothiazide (HYDRODIURIL) 25 MG tablet, Take 1 tablet by mouth once daily, Disp: 30 tablet, Rfl: 5 .  ibandronate (BONIVA) 150 MG tablet, Take 150 mg by mouth every 30 (thirty) days. Take in the morning with a full glass of water, on an empty stomach, and do not take anything else by mouth or lie down for the next 30 min., Disp: , Rfl:  .  insulin glargine (LANTUS) 100 UNIT/ML injection, Inject 0.16 mLs (16 Units total) into the skin at bedtime., Disp: 10 mL, Rfl: 5 .  latanoprost (XALATAN) 0.005 % ophthalmic solution, 1 drop at bedtime., Disp: , Rfl:  .  lisinopril (ZESTRIL) 40 MG tablet, Take 1 tablet (40 mg total) by mouth daily., Disp: 30 tablet, Rfl: 1 .  metoprolol tartrate (LOPRESSOR) 50 MG tablet, Take 1 tablet (50 mg total) by mouth 2 (two) times daily., Disp: 180 tablet, Rfl: 3 .  multivitamin-iron-minerals-folic acid (CENTRUM) chewable tablet, Chew 1 tablet by mouth daily., Disp: , Rfl:  .  Omeprazole-Sodium Bicarbonate (ZEGERID) 20-1100 MG  CAPS capsule, Take 1 capsule by mouth daily before breakfast., Disp: , Rfl:  .  potassium chloride (KLOR-CON) 10 MEQ tablet, Take 1 tablet (10 mEq total) by mouth daily., Disp: 30 tablet, Rfl: 1 .  sitaGLIPtin-metformin (JANUMET) 50-1000 MG tablet, Take 1 tablet by mouth 2 (two) times daily with a meal., Disp: 60 tablet, Rfl: 2 .  timolol (TIMOPTIC) 0.5 % ophthalmic solution, INSTILL 1 DROP INTO EACH EYE ONCE DAILY, Disp: , Rfl:     Physical Exam: Blood pressure (!) 142/70, pulse (!) 53, height 5\' 4"  (1.626 m), weight 142 lb (64.4 kg), SpO2 97 %.  Affect appropriate Healthy:  appears stated age 40: normal Neck supple with no adenopathy JVP normal no bruits no thyromegaly Lungs clear with no wheezing and good diaphragmatic motion Heart:  S1/S2 no murmur, no rub, gallop or click PMI normal Abdomen: benighn, BS positve, no tenderness, no AAA no bruit.  No HSM or HJR Distal pulses intact with no bruits No edema Neuro non-focal Skin warm and dry No muscular weakness   Labs:   Lab Results  Component Value Date   WBC 9.2 12/12/2019   HGB 12.3 12/12/2019   HCT 39.2 12/12/2019   MCV 83.8 12/12/2019   PLT 430 (H) 12/12/2019    Recent Labs  Lab 12/10/19 2240 12/11/19 0027 12/12/19 0455  NA 131*   < > 131*  K 3.6   < > 3.5  CL 97*   < > 95*  CO2 21*   < > 25  BUN 16   < > 19  CREATININE 0.69   < > 0.78  CALCIUM 8.5*   < > 8.0*  PROT 7.3  --   --   BILITOT 0.6  --   --   ALKPHOS 54  --   --   ALT 18  --   --   AST 19  --   --   GLUCOSE 178*   < > 152*   < > = values in this interval not displayed.   No results found for: CKTOTAL, CKMB, CKMBINDEX, TROPONINI No results found for: CHOL No results found for: HDL No results found for: LDLCALC No results found for: TRIG No results found for: CHOLHDL No results found for: LDLDIRECT    Radiology: MR ANGIO ABDOMEN W WO CONTRAST  Result Date: 12/11/2019 CLINICAL DATA:  Uncontrolled hypertension, evaluate for renal  artery stenosis EXAM: MRA ABDOMEN AND PELVIS WITH CONTRAST TECHNIQUE: Multiplanar, multiecho pulse sequences of the abdomen and pelvis were obtained with intravenous contrast. Angiographic images of abdomen and pelvis were obtained using MRA technique with intravenous contrast. CONTRAST:  69mL GADAVIST GADOBUTROL 1 MMOL/ML IV SOLN COMPARISON:  None. FINDINGS: ARTERIAL Aorta: Mildly tortuous with moderate atheromatous irregularity. No aneurysm, dissection, or stenosis. Celiac axis:          Patent Superior mesenteric:  Patent, with classic  distal branch anatomy. Left renal: Single, no evidence of proximal high-grade stenosis. Right renal: Single, no evidence of proximal high-grade stenosis. Inferior mesenteric:  Diminutive, patent Left iliac: Unremarkable. Distal external iliac not visualized. Right iliac:          Patent VENOUS Dedicated venous phase imaging not obtained. Patent portal and renal veins are noted. NONVASCULAR Lower chest:  No acute findings. Hepatobiliary: Negative limited evaluation, incompletely visualized Pancreas: Negative limited evaluation Spleen: Normal in size Adrenals/Urinary Tract: No masses identified. No evidence of hydronephrosis. Stomach/Bowel: Incompletely visualized. No evidence of obstruction, inflammatory process, or abnormal fluid collections. Lymphatic: No pathologically enlarged lymph nodes identified. Reproductive: Previous hysterectomy Other: No ascites. Musculoskeletal: Orthopedic hardware across the right femoral neck. IMPRESSION: 1. No evidence of significant proximal renal artery stenosis or other lesion to suggest a renovascular component of the hypertension. 2.  Aortic Atherosclerosis (ICD10-I70.0). Electronically Signed   By: Lucrezia Europe M.D.   On: 12/11/2019 17:03   DG Chest Port 1 View  Result Date: 12/10/2019 CLINICAL DATA:  Shortness of breath for 2 days, worse with lying flat. History of hypertension and diabetes. EXAM: PORTABLE CHEST 1 VIEW COMPARISON:  06/24/2005  FINDINGS: Shallow inspiration. Heart size is normal. Diffuse nodular  and interstitial infiltrative pattern to the lungs may represent diffuse pneumonitis. Peribronchial thickening. Blunting of the left costophrenic angle suggests a small effusion. Calcification of the aorta. Degenerative changes in the shoulders. IMPRESSION: Diffuse nodular and interstitial infiltrative pattern to the lungs suggesting diffuse pneumonitis. Small left pleural effusion. Electronically Signed   By: Lucienne Capers M.D.   On: 12/10/2019 23:08   ECHOCARDIOGRAM COMPLETE  Result Date: 12/11/2019    ECHOCARDIOGRAM REPORT   Patient Name:   DAMAYA CHANNING Date of Exam: 12/11/2019 Medical Rec #:  [id number]     Height:       64.0 in Accession #:    [id number]    Weight:       142.0 lb Date of Birth:  Mar 25, 1936    BSA:          1.691 m Patient Age:    42 years      BP:           160/62 mmHg Patient Gender: F             HR:           74 bpm. Exam Location:  [facility] Procedure: 2D Echo Indications:    Dyspnea 786.09 / R06.00                 Abnormal ECG 794.31 / R94.31  History:        Patient has no prior history of Echocardiogram examinations.                 Abnormal ECG; Risk Factors:Dyslipidemia, Hypertension and                 Diabetes.  Sonographer:    Leavy Cella RDCS (AE) Referring Phys: [id number] DAVID MANUEL [place]  1. Left ventricular ejection fraction, by estimation, is 55 to 60%. The left ventricle has normal function. The left ventricle has no regional wall motion abnormalities. There is moderate left ventricular hypertrophy. Left ventricular diastolic parameters are consistent with Grade II diastolic dysfunction (pseudonormalization). Elevated left atrial pressure.  2. Right ventricular systolic function is normal. The right ventricular size is normal. There is mildly elevated pulmonary artery systolic pressure.  3. Left atrial size was severely dilated.  4. The mitral valve is normal in structure. Mild mitral  valve regurgitation. Mild mitral stenosis.  5. The aortic valve is tricuspid. Aortic valve regurgitation is not visualized. No aortic stenosis is present.  6. The inferior vena cava is normal in size with greater than 50% respiratory variability, suggesting right atrial pressure of 3 mmHg. FINDINGS  Left Ventricle: Left ventricular ejection fraction, by estimation, is 55 to 60%. The left ventricle has normal function. The left ventricle has no regional wall motion abnormalities. The left ventricular internal cavity size was normal in size. There is  moderate left ventricular hypertrophy. Left ventricular diastolic parameters are consistent with Grade II diastolic dysfunction (pseudonormalization). Elevated left atrial pressure. Right Ventricle: The right ventricular size is normal. No increase in right ventricular wall thickness. Right ventricular systolic function is normal. There is mildly elevated pulmonary artery systolic pressure. The tricuspid regurgitant velocity is 2.85  m/s, and with an assumed right atrial pressure of 10 mmHg, the estimated right ventricular systolic pressure is 76.2 mmHg. Left Atrium: Left atrial size was severely dilated. Right Atrium: Right atrial size was normal in size. Pericardium: There is no evidence of pericardial effusion. Mitral Valve: The mitral valve is normal in structure. There is mild thickening  of the mitral valve leaflet(s). There is mild calcification of the mitral valve leaflet(s). Severe mitral annular calcification. Mild mitral valve regurgitation. Mild mitral valve stenosis. MV peak gradient, 7.0 mmHg. The mean mitral valve gradient is 3.0 mmHg. Tricuspid Valve: The tricuspid valve is normal in structure. Tricuspid valve regurgitation is trivial. No evidence of tricuspid stenosis. Aortic Valve: The aortic valve is tricuspid. . There is mild thickening and mild calcification of the aortic valve. Aortic valve regurgitation is not visualized. No aortic stenosis is  present. There is mild thickening of the aortic valve. There is mild calcification of the aortic valve. Aortic valve mean gradient measures 4.8 mmHg. Aortic valve peak gradient measures 9.5 mmHg. Aortic valve area, by VTI measures 2.41 cm. Pulmonic Valve: The pulmonic valve was not well visualized. Pulmonic valve regurgitation is mild. No evidence of pulmonic stenosis. Aorta: The aortic root is normal in size and structure. Pulmonary Artery: Indeterminant PASP, inadequate TR jet. Venous: The inferior vena cava is normal in size with greater than 50% respiratory variability, suggesting right atrial pressure of 3 mmHg. IAS/Shunts: The interatrial septum was not well visualized.  LEFT VENTRICLE PLAX 2D LVIDd:         3.38 cm  Diastology LVIDs:         2.45 cm  LV e' lateral:   4.20 cm/s LV PW:         1.47 cm  LV E/e' lateral: 29.3 LV IVS:        1.38 cm  LV e' medial:    3.68 cm/s LVOT diam:     1.90 cm  LV E/e' medial:  33.4 LV SV:         79 LV SV Index:   47 LVOT Area:     2.84 cm  RIGHT VENTRICLE RV S prime:     16.80 cm/s TAPSE (M-mode): 2.3 cm LEFT ATRIUM              Index       RIGHT ATRIUM           Index LA diam:        4.50 cm  2.66 cm/m  RA Area:     14.60 cm LA Vol (A2C):   109.0 ml 64.45 ml/m RA Volume:   41.50 ml  24.54 ml/m LA Vol (A4C):   79.8 ml  47.18 ml/m LA Biplane Vol: 100.0 ml 59.13 ml/m  AORTIC VALVE AV Area (Vmax):    2.29 cm AV Area (Vmean):   2.15 cm AV Area (VTI):     2.41 cm AV Vmax:           153.76 cm/s AV Vmean:          103.653 cm/s AV VTI:            0.328 m AV Peak Grad:      9.5 mmHg AV Mean Grad:      4.8 mmHg LVOT Vmax:         124.25 cm/s LVOT Vmean:        78.629 cm/s LVOT VTI:          0.280 m LVOT/AV VTI ratio: 0.85  AORTA Ao Root diam: 2.60 cm MITRAL VALVE                TRICUSPID VALVE MV Area (PHT): 4.49 cm     TR Peak grad:   32.5 mmHg MV Peak grad:  7.0 mmHg     TR Vmax:  285.00 cm/s MV Mean grad:  3.0 mmHg MV Vmax:       1.32 m/s     SHUNTS MV Vmean:       74.4 cm/s    Systemic VTI:  0.28 m MV Decel Time: 169 msec     Systemic Diam: 1.90 cm MR Peak grad: 135.5 mmHg MR Vmax:      582.00 cm/s MV E velocity: 123.00 cm/s MV A velocity: 101.00 cm/s MV E/A ratio:  1.22 Carlyle Dolly MD Electronically signed by Carlyle Dolly MD Signature Date/Time: 12/11/2019/3:24:19 PM    Final     EKG: SR rate 85 LAE diffuse ST depression LAD   ASSESSMENT AND PLAN:   1. HTN d/c with hydralazine, lopressor, norvasc , lisinopril and HCTZ  MRA negative for RAS  Improved  2. Diastolic CHF:  Related to age and hypertensive urgency as well as type 2 DM improved and euvolemic  3. DM:  Discussed low carb diet.  Target hemoglobin A1c is 6.5 or less.  Continue current medications. 4. HLD:  Continue statin  5. Abnormal ECG:  In setting of ? Diastolic CHF she is very active walking daily with no chest pain will defer stress testing at this time   F/U in 6 months   Signed: Jenkins Rouge 12/16/2019, 1:11 PM

## 2019-12-15 NOTE — Telephone Encounter (Signed)
  Prescription Request  12/15/2019  What is the name of the medication or equipment? Lantus  Have you contacted your pharmacy to request a refill? (if applicable) Yes  Which pharmacy would you like this sent to? [facility], [place]  Pt says she only has 2 lantus injections left and needs refills. Pt has appt to see Dr Lajuana Ripple on 12/19/19 for check up.   Patient notified that their request is being sent to the clinical staff for review and that they should receive a response within 2 business days.

## 2019-12-16 ENCOUNTER — Encounter: Payer: Self-pay | Admitting: Cardiovascular Disease

## 2019-12-16 ENCOUNTER — Ambulatory Visit (INDEPENDENT_AMBULATORY_CARE_PROVIDER_SITE_OTHER): Payer: Medicare Other | Admitting: Cardiovascular Disease

## 2019-12-16 ENCOUNTER — Other Ambulatory Visit: Payer: Self-pay

## 2019-12-16 VITALS — BP 142/70 | HR 53 | Ht 64.0 in | Wt 142.0 lb

## 2019-12-16 DIAGNOSIS — I10 Essential (primary) hypertension: Secondary | ICD-10-CM

## 2019-12-16 DIAGNOSIS — I5031 Acute diastolic (congestive) heart failure: Secondary | ICD-10-CM

## 2019-12-16 NOTE — Patient Instructions (Signed)
Medication Instructions:  Your physician recommends that you continue on your current medications as directed. Please refer to the Current Medication list given to you today.  *If you need a refill on your cardiac medications before your next appointment, please call your pharmacy*   Lab Work: None today If you have labs (blood work) drawn today and your tests are completely normal, you will receive your results only by: . MyChart Message (if you have MyChart) OR . A paper copy in the mail If you have any lab test that is abnormal or we need to change your treatment, we will call you to review the results.   Testing/Procedures: None today   Follow-Up: At [facility], you and your health needs are our priority.  As part of our continuing mission to provide you with exceptional heart care, we have created designated Provider Care Teams.  These Care Teams include your primary Cardiologist (physician) and Advanced Practice Providers (APPs -  Physician Assistants and Nurse Practitioners) who all work together to provide you with the care you need, when you need it.  We recommend signing up for the patient portal called "MyChart".  Sign up information is provided on this After Visit Summary.  MyChart is used to connect with patients for Virtual Visits (Telemedicine).  Patients are able to view lab/test results, encounter notes, upcoming appointments, etc.  Non-urgent messages can be sent to your provider as well.   To learn more about what you can do with MyChart, go to [email or web address].    Your next appointment:   6 month(s)  The format for your next appointment:   In Person  Provider:   Peter Nishan, MD   Other Instructions None      Thank you for choosing [facility] !         

## 2019-12-17 ENCOUNTER — Telehealth: Payer: Self-pay | Admitting: *Deleted

## 2019-12-17 MED ORDER — LANTUS SOLOSTAR 100 UNIT/ML ~~LOC~~ SOPN
16.0000 [IU] | PEN_INJECTOR | Freq: Every day | SUBCUTANEOUS | 0 refills | Status: DC
Start: 2019-12-17 — End: 2020-05-06

## 2019-12-17 NOTE — Addendum Note (Signed)
Addended by: Antonietta Barcelona D on: 12/17/2019 02:48 PM   Modules accepted: Orders

## 2019-12-17 NOTE — Telephone Encounter (Signed)
Rx sent to pharmacy   

## 2019-12-17 NOTE — Telephone Encounter (Signed)
Absolutely ok to change to pen

## 2019-12-17 NOTE — Telephone Encounter (Signed)
Fax from [place] Re: Lantus vial 100u/ml Note from pharmacy: pt prefers solostar pens, ok to change

## 2019-12-18 LAB — GLUCOSE, CAPILLARY: Glucose-Capillary: 361 mg/dL — ABNORMAL HIGH (ref 70–99)

## 2019-12-19 ENCOUNTER — Ambulatory Visit: Payer: Medicare Other | Admitting: Nurse Practitioner

## 2020-01-07 ENCOUNTER — Ambulatory Visit: Payer: Medicare Other | Admitting: Family Medicine

## 2020-01-16 ENCOUNTER — Other Ambulatory Visit: Payer: Self-pay

## 2020-01-16 ENCOUNTER — Encounter: Payer: Self-pay | Admitting: Family Medicine

## 2020-01-16 ENCOUNTER — Ambulatory Visit (INDEPENDENT_AMBULATORY_CARE_PROVIDER_SITE_OTHER): Payer: Medicare Other | Admitting: Family Medicine

## 2020-01-16 VITALS — BP 121/75 | HR 65 | Temp 97.3°F | Ht 64.0 in | Wt 139.0 lb

## 2020-01-16 DIAGNOSIS — E1129 Type 2 diabetes mellitus with other diabetic kidney complication: Secondary | ICD-10-CM | POA: Diagnosis not present

## 2020-01-16 DIAGNOSIS — I10 Essential (primary) hypertension: Secondary | ICD-10-CM | POA: Diagnosis not present

## 2020-01-16 DIAGNOSIS — E1159 Type 2 diabetes mellitus with other circulatory complications: Secondary | ICD-10-CM | POA: Diagnosis not present

## 2020-01-16 DIAGNOSIS — Z794 Long term (current) use of insulin: Secondary | ICD-10-CM | POA: Diagnosis not present

## 2020-01-16 DIAGNOSIS — E1169 Type 2 diabetes mellitus with other specified complication: Secondary | ICD-10-CM | POA: Diagnosis not present

## 2020-01-16 DIAGNOSIS — I152 Hypertension secondary to endocrine disorders: Secondary | ICD-10-CM

## 2020-01-16 DIAGNOSIS — R809 Proteinuria, unspecified: Secondary | ICD-10-CM

## 2020-01-16 DIAGNOSIS — E785 Hyperlipidemia, unspecified: Secondary | ICD-10-CM

## 2020-01-16 DIAGNOSIS — E1165 Type 2 diabetes mellitus with hyperglycemia: Secondary | ICD-10-CM | POA: Diagnosis not present

## 2020-01-16 LAB — BAYER DCA HB A1C WAIVED: HB A1C (BAYER DCA - WAIVED): 6.1 % (ref <7.0)

## 2020-01-16 MED ORDER — GLIPIZIDE 10 MG PO TABS: 5.0000 mg | ORAL_TABLET | Freq: Every day | ORAL | 1 refills | Status: DC

## 2020-01-16 NOTE — Progress Notes (Signed)
Subjective: CC: f/u uncontrolled DM2 HPI: Kelly FIORENZA is a 84 y.o. female presenting to clinic today for:  1. Type 2 Diabetes w/ HTN and HLD w/ microalbuminuria:  Patient reports onset of type 2 diabetes about 10 to 15 years ago.  She has been on insulin since 2018. Patient reports: Glucometer: One Touch ultra. FBG <150. She did have a few hypoglycemic episodes to 50s. Compliant with Janumet 50-1000mg , Glucotrol 10 mg daily and 16 units of Lantus subq nightly, atorvastatin 20 mg daily, Norvasc 10 mg daily and hydrochlorothiazide 12.5 mg daily. Lisinopril 20mg .  Last eye exam: had w/ Dr Marin Comment  Last foot exam: UTD Last A1c:  Lab Results  Component Value Date   HGBA1C 6.7 (H) 12/11/2019  Nephropathy screen indicated?: now on ACE-I  Last flu, zoster and/or pneumovax:  UTD Immunization History  Administered Date(s) Administered  . Fluad Quad(high Dose 65+) 02/12/2019  . Influenza Split 02/07/2016, 02/08/2017  . Influenza, High Dose Seasonal PF 01/12/2015, 02/18/2018  . Influenza,inj,Quad PF,6+ Mos 02/20/2018  . Influenza,trivalent, recombinat, inj, PF 02/17/2014, 02/08/2017  . Moderna SARS-COVID-2 Vaccination 07/03/2019, 07/29/2019  . Pneumococcal Conjugate-13 01/12/2015  . Pneumococcal Polysaccharide-23 06/12/2016  . Tdap 09/17/2017  . Zoster 01/12/2015    ROS: Denies dizziness, LOC, polyuria, polydipsia, unintended weight loss/gain, foot ulcerations, numbness or tingling in extremities, shortness of breath or chest pain.    Past Medical History:  Diagnosis Date  . Arthritis    OA  . DDD (degenerative disc disease), lumbosacral   . Diabetes mellitus without complication ([place])   . Distal radius fracture    bilateral  . GERD (gastroesophageal reflux disease)   . Glaucoma   . HOH (hard of hearing)    left ear  . Hypertension   . PONV (postoperative nausea and vomiting)    Past Surgical History:  Procedure Laterality Date  . ABDOMINAL HYSTERECTOMY    . BREAST BIOPSY      . BREAST EXCISIONAL BIOPSY Left    benign  . BREAST SURGERY     left milk duct   . EYE SURGERY     cataract  . FRACTURE SURGERY Left    hip fracture  . OPEN REDUCTION INTERNAL FIXATION (ORIF) DISTAL RADIAL FRACTURE Bilateral 05/24/2015   Procedure: OPEN REDUCTION INTERNAL FIXATION (ORIF) BILATERAL DISTAL RADIAL FRACTURE VOLAR PLATE;  Surgeon: Daryll Brod, MD;  Location: [employer];  Service: Orthopedics;  Laterality: Bilateral;   Social History   Socioeconomic History  . Marital status: Widowed    Spouse name: Not on file  . Number of children: 0  . Years of education: 7  . Highest education level: 7th grade  Occupational History  . Not on file  Tobacco Use  . Smoking status: Never Smoker  . Smokeless tobacco: Never Used  Vaping Use  . Vaping Use: Never used  Substance and Sexual Activity  . Alcohol use: No  . Drug use: No  . Sexual activity: Not Currently  Other Topics Concern  . Not on file  Social History Narrative  . Not on file   Social Determinants of Health   Financial Resource Strain: Low Risk   . Difficulty of Paying Living Expenses: Not hard at all  Food Insecurity: No Food Insecurity  . Worried About Charity fundraiser in the Last Year: Never true  . Ran Out of Food in the Last Year: Never true  Transportation Needs: No Transportation Needs  . Lack of Transportation (Medical): No  .  Lack of Transportation (Non-Medical): No  Physical Activity: Sufficiently Active  . Days of Exercise per Week: 5 days  . Minutes of Exercise per Session: 60 min  Stress: No Stress Concern Present  . Feeling of Stress : Not at all  Social Connections: Moderately Integrated  . Frequency of Communication with Friends and Family: More than three times a week  . Frequency of Social Gatherings with Friends and Family: More than three times a week  . Attends Religious Services: More than 4 times per year  . Active Member of Clubs or Organizations: Yes  . Attends  Archivist Meetings: More than 4 times per year  . Marital Status: Widowed  Intimate Partner Violence: Not At Risk  . Fear of Current or Ex-Partner: No  . Emotionally Abused: No  . Physically Abused: No  . Sexually Abused: No   No outpatient medications have been marked as taking for the 01/16/20 encounter (Appointment) with Janora Norlander, DO.   Family History  Problem Relation Age of Onset  . Heart disease Mother   . Heart disease Father    Allergies  Allergen Reactions  . Codeine Nausea Only    ROS: Per HPI  Objective: Office vital signs reviewed. There were no vitals taken for this visit.  Physical Examination:  General: Awake, alert, well nourished, No acute distress HEENT: Normal, sclera white, MMM Cardio: regular rate and rhythm, S1S2 heard, no murmurs appreciated Pulm: clear to auscultation bilaterally, no wheezes, rhonchi or rales; normal work of breathing on room air Extremities: warm, well perfused, No edema, cyanosis or clubbing; +2 pulses bilaterally  Assessment/ Plan: 84 y.o. female   1. Controlled type 2 diabetes mellitus with other specified complication, with long-term current use of insulin (HCC) A1c down and now under control.  However, I do worry about the hypoglycemic episode she has had.  Reduce glipizide to 5 mg daily.  This was reiterated with the patient she was good understanding the plan.  Okay to continue all other medicines as prescribed.  She will continue to monitor blood sugars.  We will follow-up in 3 to 4 months, sooner if needed - Bayer DCA Hb A1c Waived  2. Hypertension associated with diabetes ([place]) Controlled - Renal Function Panel  3. Microalbuminuria due to type 2 diabetes mellitus ([place]) On ACE inhibitor now  4. Hyperlipidemia associated with type 2 diabetes mellitus ([place]) Continue statin   Orders Placed This Encounter  Procedures  . Bayer DCA Hb A1c Waived  . Renal Function Panel   Meds ordered this  encounter  Medications  . glipiZIDE (GLUCOTROL) 10 MG tablet    Sig: Take 0.5 tablets (5 mg total) by mouth daily before breakfast.    Dispense:  45 tablet    Refill:  [place], DO [place] [phone]

## 2020-01-17 LAB — RENAL FUNCTION PANEL
Albumin: 4 g/dL (ref 3.6–4.6)
BUN/Creatinine Ratio: 21 (ref 12–28)
BUN: 17 mg/dL (ref 8–27)
CO2: 26 mmol/L (ref 20–29)
Calcium: 9.4 mg/dL (ref 8.7–10.3)
Chloride: 96 mmol/L (ref 96–106)
Creatinine, Ser: 0.8 mg/dL (ref 0.57–1.00)
GFR calc Af Amer: 80 mL/min/{1.73_m2} (ref >59)
GFR calc non Af Amer: 69 mL/min/{1.73_m2} (ref >59)
Glucose: 47 mg/dL — ABNORMAL LOW (ref 65–99)
Phosphorus: 3.7 mg/dL (ref 3.0–4.3)
Potassium: 4.5 mmol/L (ref 3.5–5.2)
Sodium: 135 mmol/L (ref 134–144)

## 2020-01-18 ENCOUNTER — Other Ambulatory Visit: Payer: Self-pay | Admitting: Family Medicine

## 2020-02-02 ENCOUNTER — Ambulatory Visit (INDEPENDENT_AMBULATORY_CARE_PROVIDER_SITE_OTHER): Payer: Medicare Other | Admitting: Nurse Practitioner

## 2020-02-02 ENCOUNTER — Other Ambulatory Visit: Payer: Self-pay

## 2020-02-02 ENCOUNTER — Encounter: Payer: Self-pay | Admitting: Nurse Practitioner

## 2020-02-02 VITALS — BP 236/98 | HR 64 | Temp 96.8°F | Resp 20 | Ht 64.0 in | Wt 139.0 lb

## 2020-02-02 DIAGNOSIS — R079 Chest pain, unspecified: Secondary | ICD-10-CM | POA: Diagnosis not present

## 2020-02-02 DIAGNOSIS — K219 Gastro-esophageal reflux disease without esophagitis: Secondary | ICD-10-CM | POA: Diagnosis not present

## 2020-02-02 MED ORDER — OMEPRAZOLE 40 MG PO CPDR: 40.0000 mg | DELAYED_RELEASE_CAPSULE | Freq: Every day | ORAL | 3 refills | Status: DC

## 2020-02-02 NOTE — Patient Instructions (Signed)

## 2020-02-02 NOTE — Progress Notes (Signed)
Subjective:    Patient ID: [name], female    DOB: 10/02/1935, 84 y.o.   MRN: [id number]  Chief Complaint: Chest Pain   HPI Central chest pain that radiates up through her throat that began 2 days ago, burning sensation that lasts ~1 hr per episode. Pt states she feels warm and sweats during each episode that makes it hard to breath. Fanning/washing face helps symptoms. 0/10 at this time, 8/10 during episodes. Pt established with cards.   Review of Systems  Constitutional: Negative.   HENT: Negative.   Eyes: Negative.   Respiratory: Positive for shortness of breath and wheezing.   Cardiovascular: Positive for chest pain.  Gastrointestinal: Negative.   Endocrine: Negative.   Genitourinary: Negative.   Musculoskeletal: Negative.   Skin: Negative.   Allergic/Immunologic: Negative.   Neurological: Negative.   Hematological: Negative.   Psychiatric/Behavioral: Negative.   All other systems reviewed and are negative.      Objective:   Physical Exam Vitals and nursing note reviewed.  Constitutional:      Appearance: She is well-developed.  HENT:     Head: Normocephalic and atraumatic.  Eyes:     Extraocular Movements: Extraocular movements intact.     Pupils: Pupils are equal, round, and reactive to light.  Cardiovascular:     Rate and Rhythm: Normal rate. Rhythm irregular.  Pulmonary:     Effort: Pulmonary effort is normal.     Breath sounds: Normal breath sounds.  Chest:     Breasts:        Right: Normal.        Left: Normal.  Abdominal:     Palpations: Abdomen is soft.  Musculoskeletal:        General: Normal range of motion.     Cervical back: Normal range of motion and neck supple.  Skin:    General: Skin is warm and dry.     Capillary Refill: Capillary refill takes less than 2 seconds.  Neurological:     General: No focal deficit present.     Mental Status: She is alert and oriented to person, place, and time.  Psychiatric:        Mood and Affect:  Mood normal.        Behavior: Behavior normal.    BP (!) 236/98   Pulse 64   Temp (!) 96.8 F (36 C) (Temporal)   Resp 20   Ht 5\' 4"  (1.626 m)   Wt 139 lb (63 kg)   SpO2 94%   BMI 23.86 kg/m   Manual BP/left arm 200/85 EKG NSR with PACs, reviewed by MD Dettinger    Assessment & Plan:  [name] in today with chief complaint of Chest Pain   1. Chest pain, unspecified type Continue to take medication as prescribed, report any new or worsening symptoms. Eat a heart healthy diet that is low in salt.   - EKG 12-Lead  2. Mild acid reflux Avoid spicy foods Do not eat 2 hours prior to bedtime Meds ordered this encounter  Medications  . omeprazole (PRILOSEC) 40 MG capsule    Sig: Take 1 capsule (40 mg total) by mouth daily.    Dispense:  30 capsule    Refill:  3   keepo follow up with DR. Lajuana Ripple    The above assessment and management plan was discussed with the patient. The patient verbalized understanding of and has agreed to the management plan. Patient is aware to call the clinic if  symptoms persist or worsen. Patient is aware when to return to the clinic for a follow-up visit. Patient educated on when it is appropriate to go to the emergency department.   Sila-Margaret Hassell Done, FNP

## 2020-02-13 DIAGNOSIS — K219 Gastro-esophageal reflux disease without esophagitis: Secondary | ICD-10-CM | POA: Diagnosis not present

## 2020-02-26 ENCOUNTER — Other Ambulatory Visit: Payer: Self-pay | Admitting: *Deleted

## 2020-02-26 NOTE — Progress Notes (Unsigned)
Med was on list - but never filled by [facility] - please RF IF appropriate.

## 2020-02-27 NOTE — Progress Notes (Signed)
Patient aware- appointment scheduled with DR. Darnell Level 10/29.  Will send to [facility] to get patient scheduled for a dexa before her appointment.

## 2020-02-27 NOTE — Progress Notes (Signed)
DXA appt made

## 2020-02-27 NOTE — Progress Notes (Signed)
I would like her to get a DEXA scan and follow up with me in office.  Her previous PCP's note indicated Vit D/ calcium only.

## 2020-02-28 DIAGNOSIS — M25511 Pain in right shoulder: Secondary | ICD-10-CM | POA: Diagnosis not present

## 2020-02-28 DIAGNOSIS — M25512 Pain in left shoulder: Secondary | ICD-10-CM | POA: Diagnosis not present

## 2020-02-28 DIAGNOSIS — M62838 Other muscle spasm: Secondary | ICD-10-CM | POA: Diagnosis not present

## 2020-02-28 DIAGNOSIS — M546 Pain in thoracic spine: Secondary | ICD-10-CM | POA: Diagnosis not present

## 2020-03-02 ENCOUNTER — Encounter: Payer: Self-pay | Admitting: Nurse Practitioner

## 2020-03-02 ENCOUNTER — Other Ambulatory Visit: Payer: Medicare Other

## 2020-03-02 ENCOUNTER — Ambulatory Visit (INDEPENDENT_AMBULATORY_CARE_PROVIDER_SITE_OTHER): Payer: Medicare Other | Admitting: Nurse Practitioner

## 2020-03-02 DIAGNOSIS — K591 Functional diarrhea: Secondary | ICD-10-CM

## 2020-03-02 NOTE — Progress Notes (Signed)
   Virtual Visit via telephone Note Due to COVID-19 pandemic this visit was conducted virtually. This visit type was conducted due to national recommendations for restrictions regarding the COVID-19 Pandemic (e.g. social distancing, sheltering in place) in an effort to limit this patient's exposure and mitigate transmission in our community. All issues noted in this document were discussed and addressed.  A physical exam was not performed with this format.  I connected with [name] on 03/02/20 at 3:30 by telephone and verified that I am speaking with the correct person using two identifiers. [name] is currently located at home and a friend  is currently with her during visit. The provider, Louvenia-Margaret Hassell Done, FNP is located in their office at time of visit.  I discussed the limitations, risks, security and privacy concerns of performing an evaluation and management service by telephone and the availability of in person appointments. I also discussed with the patient that there may be a patient responsible charge related to this service. The patient expressed understanding and agreed to proceed.   History and Present Illness:   Chief Complaint: diarrhea  HPI Patient calls she went to the urgent care on Saturday evening with back pain. She was given diclofenac and flexeril. On Sunday she started get nauseated and having diarrhea. She says that she has gone 3 x today and has been runny. Denies abdominal pain.   Review of Systems  Constitutional: Negative for chills and fever.  HENT: Positive for congestion. Negative for sore throat.   Respiratory: Positive for cough.   Gastrointestinal: Positive for diarrhea. Negative for abdominal pain, constipation, nausea and vomiting.     Observations/Objective: Alert and oriented- answers all questions appropriately No distress    Assessment and Plan: [name] in today with chief complaint of URI   1. Functional  diarrhea .force fluids Imodium AD OTC  Stop the diclofenac Rest    Follow Up Instructions: prn    I discussed the assessment and treatment plan with the patient. The patient was provided an opportunity to ask questions and all were answered. The patient agreed with the plan and demonstrated an understanding of the instructions.   The patient was advised to call back or seek an in-person evaluation if the symptoms worsen or if the condition fails to improve as anticipated.  The above assessment and management plan was discussed with the patient. The patient verbalized understanding of and has agreed to the management plan. Patient is aware to call the clinic if symptoms persist or worsen. Patient is aware when to return to the clinic for a follow-up visit. Patient educated on when it is appropriate to go to the emergency department.   Time call ended:  3:45  I provided 15 minutes of non-face-to-face time during this encounter.    Monserat-Margaret Hassell Done, FNP

## 2020-03-03 ENCOUNTER — Telehealth: Payer: Self-pay

## 2020-03-03 NOTE — Telephone Encounter (Signed)
Patient's diarrhea is worse today that it was yesterday.  States she had to put a pull up on since she was having accidents on her self. No other symptoms but weakness.  Please advise

## 2020-03-03 NOTE — Telephone Encounter (Signed)
She needs to be seen.  If no appts available in office tomorrow, urgent care.

## 2020-03-03 NOTE — Telephone Encounter (Signed)
Aware and verbalizes understanding.  

## 2020-03-05 ENCOUNTER — Ambulatory Visit: Payer: Medicare Other | Admitting: Family Medicine

## 2020-03-05 ENCOUNTER — Encounter: Payer: Self-pay | Admitting: Family Medicine

## 2020-03-05 ENCOUNTER — Ambulatory Visit (INDEPENDENT_AMBULATORY_CARE_PROVIDER_SITE_OTHER): Payer: Medicare Other

## 2020-03-05 ENCOUNTER — Other Ambulatory Visit: Payer: Self-pay

## 2020-03-05 ENCOUNTER — Ambulatory Visit (INDEPENDENT_AMBULATORY_CARE_PROVIDER_SITE_OTHER): Payer: Medicare Other | Admitting: Family Medicine

## 2020-03-05 VITALS — BP 155/76 | HR 72 | Temp 96.9°F | Ht 64.0 in | Wt 134.6 lb

## 2020-03-05 DIAGNOSIS — R0789 Other chest pain: Secondary | ICD-10-CM | POA: Diagnosis not present

## 2020-03-05 DIAGNOSIS — R197 Diarrhea, unspecified: Secondary | ICD-10-CM

## 2020-03-05 DIAGNOSIS — R0781 Pleurodynia: Secondary | ICD-10-CM | POA: Diagnosis not present

## 2020-03-05 MED ORDER — TIZANIDINE HCL 2 MG PO CAPS: 2.0000 mg | ORAL_CAPSULE | Freq: Three times a day (TID) | ORAL | 0 refills | Status: DC | PRN

## 2020-03-05 NOTE — Patient Instructions (Signed)

## 2020-03-05 NOTE — Progress Notes (Signed)
Subjective: CC: Diarrhea PCP: Janora Norlander, DO [name] is a 84 y.o. female presenting to clinic today for:  1.  Diarrhea Patient reports a 1 week history of diarrhea.  She denies any melena, hematochezia, vomiting.  No known consumption of undercooked foods or untreated water.  No other sick contacts.  Denies any fever.  She is had some nausea without vomiting that seems to be accompanied by the abdominal cramping and diarrheal stools.  She is had a couple of episodes of incontinence.  She is been wearing a pad as a result.  No change in medicines.  2.  Rib pain Patient reports posterior upper back pain that has been ongoing since she developed a cough.  She notes the cough has resolved but she has ongoing rib pain that is refractory to use of diclofenac and cyclobenzaprine.  No hemoptysis, shortness of breath or wheezing.   ROS: Per HPI  Allergies  Allergen Reactions   Codeine Nausea Only   Past Medical History:  Diagnosis Date   Arthritis    OA   DDD (degenerative disc disease), lumbosacral    Diabetes mellitus without complication (HCC)    Distal radius fracture    bilateral   GERD (gastroesophageal reflux disease)    Glaucoma    HOH (hard of hearing)    left ear   Hypertension    PONV (postoperative nausea and vomiting)     Current Outpatient Medications:    amLODipine (NORVASC) 10 MG tablet, Take 1 tablet (10 mg total) by mouth daily., Disp: 90 tablet, Rfl: 3   aspirin 81 MG tablet, Take 81 mg by mouth daily., Disp: , Rfl:    atorvastatin (LIPITOR) 20 MG tablet, Take 1 tablet (20 mg total) by mouth daily., Disp: 90 tablet, Rfl: 3   calcium-vitamin D (OSCAL WITH D) 500-200 MG-UNIT tablet, Take 1 tablet by mouth., Disp: , Rfl:    cyclobenzaprine (FLEXERIL) 10 MG tablet, Take by mouth., Disp: , Rfl:    fluticasone (FLONASE) 50 MCG/ACT nasal spray, Place into both nostrils daily., Disp: , Rfl:    glipiZIDE (GLUCOTROL) 10 MG tablet, TAKE  1 TABLET BY MOUTH ONCE DAILY BEFORE BREAKFAST, Disp: 90 tablet, Rfl: 0   glucose blood test strip, Test BG up to 2 times daily Dx (E 11.65), Disp: 100 each, Rfl: 12   hydrALAZINE (APRESOLINE) 25 MG tablet, Take 3 tablets (75 mg total) by mouth every 8 (eight) hours., Disp: 90 tablet, Rfl: 1   hydrochlorothiazide (HYDRODIURIL) 25 MG tablet, Take 1 tablet by mouth once daily, Disp: 30 tablet, Rfl: 5   ibandronate (BONIVA) 150 MG tablet, Take 150 mg by mouth every 30 (thirty) days. Take in the morning with a full glass of water, on an empty stomach, and do not take anything else by mouth or lie down for the next 30 min., Disp: , Rfl:    insulin glargine (LANTUS SOLOSTAR) 100 UNIT/ML Solostar Pen, Inject 16 Units into the skin at bedtime., Disp: 15 mL, Rfl: 0   latanoprost (XALATAN) 0.005 % ophthalmic solution, 1 drop at bedtime., Disp: , Rfl:    lisinopril (ZESTRIL) 40 MG tablet, Take 1 tablet (40 mg total) by mouth daily., Disp: 30 tablet, Rfl: 1   lisinopril (ZESTRIL) 40 MG tablet, Take by mouth., Disp: , Rfl:    metoprolol tartrate (LOPRESSOR) 50 MG tablet, Take 1 tablet (50 mg total) by mouth 2 (two) times daily., Disp: 180 tablet, Rfl: 3   multivitamin-iron-minerals-folic acid (CENTRUM) chewable  tablet, Chew 1 tablet by mouth daily., Disp: , Rfl:    omeprazole (PRILOSEC) 40 MG capsule, Take 1 capsule (40 mg total) by mouth daily., Disp: 30 capsule, Rfl: 3   potassium chloride (KLOR-CON) 10 MEQ tablet, Take 1 tablet (10 mEq total) by mouth daily., Disp: 30 tablet, Rfl: 1   sitaGLIPtin-metformin (JANUMET) 50-1000 MG tablet, Take 1 tablet by mouth 2 (two) times daily with a meal., Disp: 60 tablet, Rfl: 2   timolol (TIMOPTIC) 0.5 % ophthalmic solution, INSTILL 1 DROP INTO EACH EYE ONCE DAILY, Disp: , Rfl:    DEXILANT 60 MG capsule, Take 1 capsule by mouth daily. (Patient not taking: Reported on 03/05/2020), Disp: , Rfl:    diclofenac (VOLTAREN) 50 MG EC tablet, Take by mouth. (Patient  not taking: Reported on 03/05/2020), Disp: , Rfl:  Social History   Socioeconomic History   Marital status: Widowed    Spouse name: Not on file   Number of children: 0   Years of education: 7   Highest education level: 7th grade  Occupational History   Not on file  Tobacco Use   Smoking status: Never Smoker   Smokeless tobacco: Never Used  Vaping Use   Vaping Use: Never used  Substance and Sexual Activity   Alcohol use: No   Drug use: No   Sexual activity: Not Currently  Other Topics Concern   Not on file  Social History Narrative   Not on file   Social Determinants of Health   Financial Resource Strain: Low Risk    Difficulty of Paying Living Expenses: Not hard at all  Food Insecurity: No Food Insecurity   Worried About Charity fundraiser in the Last Year: Never true   [place] in the Last Year: Never true  Transportation Needs: No Transportation Needs   Lack of Transportation (Medical): No   Lack of Transportation (Non-Medical): No  Physical Activity: Sufficiently Active   Days of Exercise per Week: 5 days   Minutes of Exercise per Session: 60 min  Stress: No Stress Concern Present   Feeling of Stress : Not at all  Social Connections: Moderately Integrated   Frequency of Communication with Friends and Family: More than three times a week   Frequency of Social Gatherings with Friends and Family: More than three times a week   Attends Religious Services: More than 4 times per year   Active Member of [employer] or Organizations: Yes   Attends Archivist Meetings: More than 4 times per year   Marital Status: Widowed  Human resources officer Violence: Not At Risk   Fear of Current or Ex-Partner: No   Emotionally Abused: No   Physically Abused: No   Sexually Abused: No   Family History  Problem Relation Age of Onset   Heart disease Mother    Heart disease Father     Objective: Office vital signs reviewed. BP (!) 155/76     Pulse 72    Temp (!) 96.9 F (36.1 C)    Ht 5\' 4"  (1.626 m)    Wt 134 lb 9.6 oz (61.1 kg)    SpO2 97%    BMI 23.10 kg/m   Physical Examination:  General: Awake, alert, nontoxic, No acute distress HEENT: Normal, sclera white, MMM Cardio: regular rate and rhythm, S1S2 heard, no murmurs appreciated Pulm: clear to auscultation bilaterally, no wheezes, rhonchi or rales; normal work of breathing on room air MSK: TTP to right posterior rib at T5-6, increased  paraspinal muscle tonicity in this area as well GI: soft, non-tender, non-distended, no guarding. bowel sounds present x4, no hepatomegaly, no splenomegaly, no masses  Assessment/ Plan: 84 y.o. female   Diarrhea, unspecified type - Plan: Cdiff NAA+O+P+Stool Culture, Fecal fat, qualitative  Rib pain on right side - Plan: DG Ribs Unilateral W/Chest Right, tizanidine (ZANAFLEX) 2 MG capsule  Possibly infectious versus malabsorptive.  We will run labs.  No red flags to suggest acute abdomen or acute diverticulitis.  X-ray was obtained to evaluate persistent rib pain in the absence of cough that is refractory to oral NSAIDs and muscle relaxer.  Home care instructions reviewed with the patient.  Reasons to return discussed.  Further plan pending lab results    No orders of the defined types were placed in this encounter.  No orders of the defined types were placed in this encounter.    Janora Norlander, DO [place] [phone]

## 2020-03-11 ENCOUNTER — Ambulatory Visit (INDEPENDENT_AMBULATORY_CARE_PROVIDER_SITE_OTHER): Payer: Medicare Other

## 2020-03-11 ENCOUNTER — Other Ambulatory Visit: Payer: Self-pay | Admitting: *Deleted

## 2020-03-11 ENCOUNTER — Other Ambulatory Visit: Payer: Medicare Other

## 2020-03-11 ENCOUNTER — Other Ambulatory Visit: Payer: Self-pay

## 2020-03-11 DIAGNOSIS — J90 Pleural effusion, not elsewhere classified: Secondary | ICD-10-CM | POA: Diagnosis not present

## 2020-03-11 DIAGNOSIS — R0781 Pleurodynia: Secondary | ICD-10-CM

## 2020-03-11 DIAGNOSIS — J9811 Atelectasis: Secondary | ICD-10-CM | POA: Diagnosis not present

## 2020-03-11 DIAGNOSIS — R079 Chest pain, unspecified: Secondary | ICD-10-CM

## 2020-03-11 NOTE — Progress Notes (Signed)
Pt coming today for follow up/ repeat xray No appts available for today and none tomorrow with Dr Lajuana Ripple.   Pt is having discomfort.

## 2020-03-23 ENCOUNTER — Telehealth: Payer: Self-pay

## 2020-03-23 NOTE — Telephone Encounter (Signed)
Pt states she needs to cancel her DEXA appt for tomorrow as her back hurts and she can't lay on her back to do the DEXA. Please call pt to reschedule.

## 2020-03-24 ENCOUNTER — Other Ambulatory Visit: Payer: Medicare Other

## 2020-03-24 NOTE — Telephone Encounter (Signed)
Spoke to patient she will like to wait and she how here back is doing at appt in dec -

## 2020-04-02 ENCOUNTER — Encounter: Payer: Self-pay | Admitting: Emergency Medicine

## 2020-04-02 ENCOUNTER — Ambulatory Visit (INDEPENDENT_AMBULATORY_CARE_PROVIDER_SITE_OTHER): Payer: Medicare Other

## 2020-04-02 ENCOUNTER — Ambulatory Visit
Admission: EM | Admit: 2020-04-02 | Discharge: 2020-04-02 | Disposition: A | Discharge status: Home / Self Care | Payer: Medicare Other | Attending: Family Medicine | Admitting: Family Medicine

## 2020-04-02 DIAGNOSIS — M546 Pain in thoracic spine: Secondary | ICD-10-CM | POA: Diagnosis not present

## 2020-04-02 DIAGNOSIS — W19XXXA Unspecified fall, initial encounter: Secondary | ICD-10-CM | POA: Diagnosis not present

## 2020-04-02 DIAGNOSIS — R079 Chest pain, unspecified: Secondary | ICD-10-CM

## 2020-04-02 DIAGNOSIS — R0781 Pleurodynia: Secondary | ICD-10-CM | POA: Diagnosis not present

## 2020-04-02 DIAGNOSIS — S2231XA Fracture of one rib, right side, initial encounter for closed fracture: Secondary | ICD-10-CM

## 2020-04-02 DIAGNOSIS — G8929 Other chronic pain: Secondary | ICD-10-CM

## 2020-04-02 MED ORDER — TRAMADOL HCL 50 MG PO TABS: 25.0000 mg | ORAL_TABLET | Freq: Four times a day (QID) | ORAL | 0 refills | Status: DC | PRN

## 2020-04-02 MED ORDER — TIZANIDINE HCL 2 MG PO CAPS: 2.0000 mg | ORAL_CAPSULE | Freq: Three times a day (TID) | ORAL | 0 refills | Status: DC | PRN

## 2020-04-02 NOTE — ED Triage Notes (Signed)
Pt reached over the couch to shut the blinds and fell on the couch.  Reports hearing a popping sound.  Pain to RT side under breast and around back to mid back.  Pt went to pcp and was given some medications for pain but it is not helping.

## 2020-04-02 NOTE — Discharge Instructions (Signed)
I have sent in tramadol for you to take a half a tablet every 6 hours as needed for pain  I have also sent in tizanidine refill for you to take 3 times a day as needed for muscle spasms  Your seventh rib on the right side is fractured, the radiologist was unable to tell if this was new or not. Given your pain now, I think that you may have fractured it with your most recent fall.  Be very careful with the medications, they can also make you sleepy and can increase your fall risk. Do not take these medications anymore alone. Do not operate a car while you are taking these medications.  Follow up with this office or with primary care if symptoms are persisting.  Follow up in the ER for high fever, trouble swallowing, trouble breathing, other concerning symptoms.

## 2020-04-02 NOTE — ED Provider Notes (Signed)
[name]   [id number] 04/02/20 Arrival Time: 1532  VX:[id number] PAIN  SUBJECTIVE: History from: patient. [name] is a 84 y.o. female complains of right rib and right-sided back pain after she fell about a week ago.  Reports that she fell onto her couch and heard a pop when she fell. Reports that since then, she has been having pain with movement as well as at rest. Has tried Aleve, heat, ice. Reports some minimal pain relief at the time, but nothing lasting. Symptoms are made worse with activity. Denies similar symptoms in the past. Denies fever, chills, erythema, ecchymosis, effusion, weakness, numbness and tingling, saddle paresthesias, loss of bowel or bladder function.      ROS: As per HPI.  All other pertinent ROS negative.     Past Medical History:  Diagnosis Date  . Arthritis    OA  . DDD (degenerative disc disease), lumbosacral   . Diabetes mellitus without complication ([place])   . Distal radius fracture    bilateral  . GERD (gastroesophageal reflux disease)   . Glaucoma   . HOH (hard of hearing)    left ear  . Hypertension   . PONV (postoperative nausea and vomiting)    Past Surgical History:  Procedure Laterality Date  . ABDOMINAL HYSTERECTOMY    . BREAST BIOPSY    . BREAST EXCISIONAL BIOPSY Left    benign  . BREAST SURGERY     left milk duct   . EYE SURGERY     cataract  . FRACTURE SURGERY Left    hip fracture  . OPEN REDUCTION INTERNAL FIXATION (ORIF) DISTAL RADIAL FRACTURE Bilateral 05/24/2015   Procedure: OPEN REDUCTION INTERNAL FIXATION (ORIF) BILATERAL DISTAL RADIAL FRACTURE VOLAR PLATE;  Surgeon: Daryll Brod, MD;  Location: [facility];  Service: Orthopedics;  Laterality: Bilateral;   Allergies  Allergen Reactions  . Codeine Nausea Only   No current facility-administered medications on file prior to encounter.   Current Outpatient Medications on File Prior to Encounter  Medication Sig Dispense Refill  . amLODipine (NORVASC)  10 MG tablet Take 1 tablet (10 mg total) by mouth daily. 90 tablet 3  . aspirin 81 MG tablet Take 81 mg by mouth daily.    Marland Kitchen atorvastatin (LIPITOR) 20 MG tablet Take 1 tablet (20 mg total) by mouth daily. 90 tablet 3  . calcium-vitamin D (OSCAL WITH D) 500-200 MG-UNIT tablet Take 1 tablet by mouth.    . DEXILANT 60 MG capsule Take 1 capsule by mouth daily. (Patient not taking: Reported on 03/05/2020)    . fluticasone (FLONASE) 50 MCG/ACT nasal spray Place into both nostrils daily.    Marland Kitchen glipiZIDE (GLUCOTROL) 10 MG tablet TAKE 1 TABLET BY MOUTH ONCE DAILY BEFORE BREAKFAST 90 tablet 0  . glucose blood test strip Test BG up to 2 times daily Dx (E 11.65) 100 each 12  . hydrALAZINE (APRESOLINE) 25 MG tablet Take 3 tablets (75 mg total) by mouth every 8 (eight) hours. 90 tablet 1  . hydrochlorothiazide (HYDRODIURIL) 25 MG tablet Take 1 tablet by mouth once daily 30 tablet 5  . ibandronate (BONIVA) 150 MG tablet Take 150 mg by mouth every 30 (thirty) days. Take in the morning with a full glass of water, on an empty stomach, and do not take anything else by mouth or lie down for the next 30 min.    . insulin glargine (LANTUS SOLOSTAR) 100 UNIT/ML Solostar Pen Inject 16 Units into the skin at bedtime. 15  mL 0  . latanoprost (XALATAN) 0.005 % ophthalmic solution 1 drop at bedtime.    Marland Kitchen lisinopril (ZESTRIL) 40 MG tablet Take 1 tablet (40 mg total) by mouth daily. 30 tablet 1  . lisinopril (ZESTRIL) 40 MG tablet Take by mouth.    . metoprolol tartrate (LOPRESSOR) 50 MG tablet Take 1 tablet (50 mg total) by mouth 2 (two) times daily. 180 tablet 3  . multivitamin-iron-minerals-folic acid (CENTRUM) chewable tablet Chew 1 tablet by mouth daily.    . potassium chloride (KLOR-CON) 10 MEQ tablet Take 1 tablet (10 mEq total) by mouth daily. 30 tablet 1  . sitaGLIPtin-metformin (JANUMET) 50-1000 MG tablet Take 1 tablet by mouth 2 (two) times daily with a meal. 60 tablet 2  . timolol (TIMOPTIC) 0.5 % ophthalmic solution  INSTILL 1 DROP INTO EACH EYE ONCE DAILY     Social History   Socioeconomic History  . Marital status: Widowed    Spouse name: Not on file  . Number of children: 0  . Years of education: 7  . Highest education level: 7th grade  Occupational History  . Not on file  Tobacco Use  . Smoking status: Never Smoker  . Smokeless tobacco: Never Used  Vaping Use  . Vaping Use: Never used  Substance and Sexual Activity  . Alcohol use: No  . Drug use: No  . Sexual activity: Not Currently  Other Topics Concern  . Not on file  Social History Narrative  . Not on file   Social Determinants of Health   Financial Resource Strain: Low Risk   . Difficulty of Paying Living Expenses: Not hard at all  Food Insecurity: No Food Insecurity  . Worried About Charity fundraiser in the Last Year: Never true  . Ran Out of Food in the Last Year: Never true  Transportation Needs: No Transportation Needs  . Lack of Transportation (Medical): No  . Lack of Transportation (Non-Medical): No  Physical Activity: Sufficiently Active  . Days of Exercise per Week: 5 days  . Minutes of Exercise per Session: 60 min  Stress: No Stress Concern Present  . Feeling of Stress : Not at all  Social Connections: Moderately Integrated  . Frequency of Communication with Friends and Family: More than three times a week  . Frequency of Social Gatherings with Friends and Family: More than three times a week  . Attends Religious Services: More than 4 times per year  . Active Member of Clubs or Organizations: Yes  . Attends Archivist Meetings: More than 4 times per year  . Marital Status: Widowed  Intimate Partner Violence: Not At Risk  . Fear of Current or Ex-Partner: No  . Emotionally Abused: No  . Physically Abused: No  . Sexually Abused: No   Family History  Problem Relation Age of Onset  . Heart disease Mother   . Heart disease Father     OBJECTIVE:  Vitals:   04/02/20 1644  BP: (!) 209/75  Pulse:  81  Resp: 18  Temp: 98.4 F (36.9 C)  TempSrc: Oral  SpO2: 91%    General appearance: ALERT; in no acute distress.  Head: NCAT Lungs: Normal respiratory effort CV:  pulses 2+ bilaterally. Cap refill < 2 seconds Musculoskeletal:  Inspection: Skin warm, dry, clear and intact without obvious erythema, effusion, or ecchymosis.  Palpation: Right thoracic back and along right side around to under the right breast tender to palpation ROM: Limited ROM active and passive with bending, twisting,  position changes Skin: warm and dry Neurologic: Ambulates without difficulty; Sensation intact about the upper/ lower extremities Psychological: alert and cooperative; normal mood and affect  DIAGNOSTIC STUDIES:  No results found.   ASSESSMENT & PLAN:  1. Closed fracture of one rib of right side, initial encounter   2. Rib pain on right side   3. Fall, initial encounter   4. Chronic right-sided thoracic back pain     Meds ordered this encounter  Medications  . tizanidine (ZANAFLEX) 2 MG capsule    Sig: Take 1 capsule (2 mg total) by mouth 3 (three) times daily as needed for muscle spasms.    Dispense:  30 capsule    Refill:  0    Order Specific Question:   Supervising Provider    Answer:   Chase Picket [id number]  . traMADol (ULTRAM) 50 MG tablet    Sig: Take 0.5 tablets (25 mg total) by mouth every 6 (six) hours as needed for moderate pain.    Dispense:  12 tablet    Refill:  0    Order Specific Question:   Supervising Provider    Answer:   Chase Picket [[id number]]   Xray today shows fractured rib Prescribed tizanidine Prescribed tramadol Continue conservative management of rest, ice, and gentle stretches Take tramadol as prescribed for pain relief (may cause abdominal discomfort, ulcers, and GI bleeds avoid taking with other NSAIDs) Take tizanidine at nighttime for symptomatic relief. Avoid driving or operating heavy machinery while using medication. Follow up with PCP if  symptoms persist Return or go to the ER if you have any new or worsening symptoms (fever, chills, chest pain, abdominal pain, changes in bowel or bladder habits, pain radiating into lower legs)   [place] Controlled Substances Registry consulted for this patient. I feel the risk/benefit ratio today is favorable for proceeding with this prescription for a controlled substance. Medication sedation precautions given.  Reviewed expectations re: course of current medical issues. Questions answered. Outlined signs and symptoms indicating need for more acute intervention. Patient verbalized understanding. After Visit Summary given.       Faustino Congress, NP 04/05/20 1337

## 2020-04-05 ENCOUNTER — Other Ambulatory Visit: Payer: Self-pay | Admitting: Family Medicine

## 2020-04-05 DIAGNOSIS — E1165 Type 2 diabetes mellitus with hyperglycemia: Secondary | ICD-10-CM

## 2020-04-15 ENCOUNTER — Ambulatory Visit: Payer: Medicare Other | Admitting: Family Medicine

## 2020-04-16 ENCOUNTER — Other Ambulatory Visit: Payer: Self-pay | Admitting: *Deleted

## 2020-04-16 DIAGNOSIS — E1159 Type 2 diabetes mellitus with other circulatory complications: Secondary | ICD-10-CM

## 2020-04-16 DIAGNOSIS — E1129 Type 2 diabetes mellitus with other diabetic kidney complication: Secondary | ICD-10-CM

## 2020-04-19 MED ORDER — LISINOPRIL 40 MG PO TABS: 40.0000 mg | ORAL_TABLET | Freq: Every day | ORAL | 1 refills | Status: DC

## 2020-05-05 ENCOUNTER — Other Ambulatory Visit: Payer: Self-pay

## 2020-05-05 ENCOUNTER — Ambulatory Visit
Admission: EM | Admit: 2020-05-05 | Discharge: 2020-05-05 | Disposition: A | Discharge status: Home / Self Care | Payer: Medicare Other | Attending: Family Medicine | Admitting: Family Medicine

## 2020-05-05 ENCOUNTER — Encounter: Payer: Self-pay | Admitting: Emergency Medicine

## 2020-05-05 ENCOUNTER — Ambulatory Visit (INDEPENDENT_AMBULATORY_CARE_PROVIDER_SITE_OTHER): Payer: Medicare Other

## 2020-05-05 DIAGNOSIS — I509 Heart failure, unspecified: Secondary | ICD-10-CM

## 2020-05-05 DIAGNOSIS — R600 Localized edema: Secondary | ICD-10-CM

## 2020-05-05 DIAGNOSIS — I10 Essential (primary) hypertension: Secondary | ICD-10-CM | POA: Diagnosis not present

## 2020-05-05 DIAGNOSIS — R0602 Shortness of breath: Secondary | ICD-10-CM | POA: Diagnosis not present

## 2020-05-05 DIAGNOSIS — M4856XA Collapsed vertebra, not elsewhere classified, lumbar region, initial encounter for fracture: Secondary | ICD-10-CM | POA: Diagnosis not present

## 2020-05-05 MED ORDER — FUROSEMIDE 20 MG PO TABS
20.0000 mg | ORAL_TABLET | Freq: Every day | ORAL | 0 refills | Status: DC
Start: 2020-05-05 — End: 2020-06-02

## 2020-05-05 NOTE — ED Provider Notes (Signed)
[facility]    CSN: [id number] Arrival date & time: 05/05/20  0806      History   Chief Complaint Chief Complaint  Patient presents with  . Shortness of Breath    HPI [name] is a 84 y.o. female.   Patient reports bilateral lower extremity swelling that is worse than usual.  Reports that she does have swelling almost daily now and that this has been going on for the last couple of weeks.  Reports that she is experiencing shortness of breath and fatigue.  She does have history of CHF.  She was also seen in this office on 04/02/20 and found to have a fractured rib.  She reports that the rib pain is better, but that the shortness of breath is worse.  Has not attempted OTC treatment for this.  Does not take Lasix or other diuretic at this time.  The history is provided by the patient.  Shortness of Breath   Past Medical History:  Diagnosis Date  . Arthritis    OA  . DDD (degenerative disc disease), lumbosacral   . Diabetes mellitus without complication ([place])   . Distal radius fracture    bilateral  . GERD (gastroesophageal reflux disease)   . Glaucoma   . HOH (hard of hearing)    left ear  . Hypertension   . PONV (postoperative nausea and vomiting)     Patient Active Problem List   Diagnosis Date Noted  . Hypertensive emergency 12/11/2019  . Abnormal EKG 12/11/2019  . Hypomagnesemia 12/11/2019  . Abnormal chest x-ray 12/11/2019  . Hypertensive urgency 12/11/2019  . Glaucoma   . GERD (gastroesophageal reflux disease)   . Hypocalcemia   . Type 2 diabetes mellitus ([place])   . Congestive heart failure ([place])   . SOB (shortness of breath)   . Uncontrolled type 2 diabetes mellitus with hyperglycemia ([place]) 09/01/2019  . Hypertension associated with diabetes ([place]) 09/01/2019  . Hyperlipidemia associated with type 2 diabetes mellitus ([place]) 09/01/2019  . Microalbuminuria due to type 2 diabetes mellitus ([place]) 06/18/2019    Past Surgical History:   Procedure Laterality Date  . ABDOMINAL HYSTERECTOMY    . BREAST BIOPSY    . BREAST EXCISIONAL BIOPSY Left    benign  . BREAST SURGERY     left milk duct   . EYE SURGERY     cataract  . FRACTURE SURGERY Left    hip fracture  . OPEN REDUCTION INTERNAL FIXATION (ORIF) DISTAL RADIAL FRACTURE Bilateral 05/24/2015   Procedure: OPEN REDUCTION INTERNAL FIXATION (ORIF) BILATERAL DISTAL RADIAL FRACTURE VOLAR PLATE;  Surgeon: Daryll Brod, MD;  Location: [facility];  Service: Orthopedics;  Laterality: Bilateral;    OB History   No obstetric history on file.      Home Medications    Prior to Admission medications   Medication Sig Start Date End Date Taking? Authorizing Provider  furosemide (LASIX) 20 MG tablet Take 1 tablet (20 mg total) by mouth daily. 05/05/20 06/04/20 Yes Faustino Congress, NP  amLODipine (NORVASC) 10 MG tablet Take 1 tablet (10 mg total) by mouth daily. 09/01/19   Janora Norlander, DO  aspirin 81 MG tablet Take 81 mg by mouth daily.    [provider]  atorvastatin (LIPITOR) 20 MG tablet Take 1 tablet (20 mg total) by mouth daily. 09/01/19   Janora Norlander, DO  calcium-vitamin D (OSCAL WITH D) 500-200 MG-UNIT tablet Take 1 tablet by mouth.    [provider]  DEXILANT 60 MG capsule Take 1 capsule by mouth daily. Patient not taking: Reported on 03/05/2020 02/13/20   [provider]  fluticasone (FLONASE) 50 MCG/ACT nasal spray Place into both nostrils daily.    [provider]  glipiZIDE (GLUCOTROL) 10 MG tablet TAKE 1 TABLET BY MOUTH ONCE DAILY BEFORE BREAKFAST 01/19/20   Delynn Flavin M, DO  glucose blood (ONETOUCH ULTRA) test strip Test BS twice daily Dx E11.9 04/06/20   Delynn Flavin M, DO  hydrALAZINE (APRESOLINE) 25 MG tablet Take 3 tablets (75 mg total) by mouth every 8 (eight) hours. 12/12/19   Johnson, Clanford L, MD  hydrochlorothiazide (HYDRODIURIL) 25 MG tablet Take 1 tablet by mouth once daily 07/07/19    Delynn Flavin M, DO  ibandronate (BONIVA) 150 MG tablet Take 150 mg by mouth every 30 (thirty) days. Take in the morning with a full glass of water, on an empty stomach, and do not take anything else by mouth or lie down for the next 30 min.    [provider]  insulin glargine (LANTUS SOLOSTAR) 100 UNIT/ML Solostar Pen Inject 16 Units into the skin at bedtime. 12/17/19   Raliegh Ip, DO  latanoprost (XALATAN) 0.005 % ophthalmic solution 1 drop at bedtime.    [provider]  lisinopril (ZESTRIL) 40 MG tablet Take 1 tablet (40 mg total) by mouth daily. 04/19/20   Raliegh Ip, DO  metoprolol tartrate (LOPRESSOR) 50 MG tablet Take 1 tablet (50 mg total) by mouth 2 (two) times daily. 09/01/19   Raliegh Ip, DO  multivitamin-iron-minerals-folic acid (CENTRUM) chewable tablet Chew 1 tablet by mouth daily.    [provider]  potassium chloride (KLOR-CON) 10 MEQ tablet Take 1 tablet (10 mEq total) by mouth daily. 12/13/19   Johnson, Clanford L, MD  sitaGLIPtin-metformin (JANUMET) 50-1000 MG tablet Take 1 tablet by mouth 2 (two) times daily with a meal. 09/01/19   Gottschalk, Ashly M, DO  timolol (TIMOPTIC) 0.5 % ophthalmic solution INSTILL 1 DROP INTO EACH EYE ONCE DAILY 01/27/19   [provider]  tizanidine (ZANAFLEX) 2 MG capsule Take 1 capsule (2 mg total) by mouth 3 (three) times daily as needed for muscle spasms. 04/02/20   Moshe Cipro, NP  traMADol (ULTRAM) 50 MG tablet Take 0.5 tablets (25 mg total) by mouth every 6 (six) hours as needed for moderate pain. 04/02/20   Moshe Cipro, NP    Family History Family History  Problem Relation Age of Onset  . Heart disease Mother   . Heart disease Father     Social History Social History   Tobacco Use  . Smoking status: Never Smoker  . Smokeless tobacco: Never Used  Vaping Use  . Vaping Use: Never used  Substance Use Topics  . Alcohol use: No  . Drug use: No      Allergies   Codeine   Review of Systems Review of Systems  Respiratory: Positive for shortness of breath.      Physical Exam Triage Vital Signs ED Triage Vitals  Enc Vitals Group     BP 05/05/20 0817 (!) 210/80     Pulse Rate 05/05/20 0817 68     Resp 05/05/20 0817 19     Temp 05/05/20 0817 98.7 F (37.1 C)     Temp Source 05/05/20 0817 Oral     SpO2 05/05/20 0817 94 %     Weight 05/05/20 0816 133 lb (60.3 kg)     Height 05/05/20  4481 5\' 2"  (1.575 m)     Head Circumference --      Peak Flow --      Pain Score 05/05/20 0816 0     Pain Loc --      Pain Edu? --      Excl. in GC? --    No data found.  Updated Vital Signs BP (!) 210/80 (BP Location: Right Arm) Comment: pt reports her bp is always high at the doctors  Pulse 68   Temp 98.7 F (37.1 C) (Oral)   Resp 19   Ht 5\' 2"  (1.575 m)   Wt 133 lb (60.3 kg)   SpO2 94%   BMI 24.33 kg/m   Visual Acuity Right Eye Distance:   Left Eye Distance:   Bilateral Distance:    Right Eye Near:   Left Eye Near:    Bilateral Near:     Physical Exam Vitals and nursing note reviewed.  Constitutional:      General: She is not in acute distress.    Appearance: She is well-developed and well-nourished. She is not ill-appearing.     Interventions: She is not intubated. HENT:     Head: Normocephalic and atraumatic.  Eyes:     Conjunctiva/sclera: Conjunctivae normal.  Cardiovascular:     Rate and Rhythm: Normal rate and regular rhythm.     Heart sounds: No murmur heard.   Pulmonary:     Effort: Pulmonary effort is normal. No tachypnea, bradypnea, accessory muscle usage or respiratory distress. She is not intubated.     Breath sounds: Normal breath sounds. No stridor. No decreased breath sounds, wheezing, rhonchi or rales.  Chest:     Chest wall: No mass, deformity, tenderness, crepitus or edema. There is no dullness to percussion.  Abdominal:     Palpations: Abdomen is soft.     Tenderness: There is no  abdominal tenderness.  Musculoskeletal:     Cervical back: Normal range of motion and neck supple.     Right lower leg: Edema present.     Left lower leg: Edema present.  Skin:    General: Skin is warm and dry.     Capillary Refill: Capillary refill takes less than 2 seconds.  Neurological:     General: No focal deficit present.     Mental Status: She is alert and oriented to person, place, and time.  Psychiatric:        Mood and Affect: Mood and affect and mood normal.        Behavior: Behavior normal.      UC Treatments / Results  Labs (all labs ordered are listed, but only abnormal results are displayed) Labs Reviewed - No data to display  EKG   Radiology DG Chest 2 View  Result Date: 05/05/2020 CLINICAL DATA:  Shortness of breath with lower extremity edema EXAM: CHEST - 2 VIEW COMPARISON:  April 02, 2020 and March 11, 2020 FINDINGS: There is no edema or airspace opacity. Heart size and pulmonary vascularity are normal. Calcification noted in the mitral annulus. No adenopathy. Prior right lateral seventh rib fracture again noted. There are lower thoracic and upper lumbar compression fractures with increased kyphosis near the thoracolumbar junction. There is aortic atherosclerosis. IMPRESSION: No edema or airspace opacity. Stable cardiac silhouette. Stable bony changes. Aortic Atherosclerosis (ICD10-I70.0). Electronically Signed   By: April 04, 2020 III M.D.   On: 05/05/2020 08:43    Procedures Procedures (including critical care time)  Medications Ordered in UC Medications -  No data to display  Initial Impression / Assessment and Plan / UC Course  I have reviewed the triage vital signs and the nursing notes.  Pertinent labs & imaging results that were available during my care of the patient were reviewed by me and considered in my medical decision making (see chart for details).     Shortness of breath Bilateral lower extremity Hypertension CHF  Chest  x-ray negative for extra fluid, was concerned for CHF exacerbation given lower extremity edema with shortness of breath Prescribed Lasix 20 mg daily for leg swelling Has appointment to follow-up with cardiology Keep this appointment, let him know about the Lasix, as he may want to keep or change this Hypertensive in the office today, she reports she has not taken her blood pressure medication this morning yet She reports that she also gets nervous doctors offices Follow-up in the ER for worsening shortness of breath, worsening leg swelling, trouble swallowing, other concerning symptoms  Final Clinical Impressions(s) / UC Diagnoses   Final diagnoses:  SOB (shortness of breath)  Bilateral lower extremity edema  Essential hypertension  Congestive heart failure, unspecified HF chronicity, unspecified heart failure type [facility])     Discharge Instructions     Your xray looked good today. There is no fluid accumulation in your lungs.  I have sent in lasix for you to take once daily in the morning for leg swelling.  Follow up with cardiology   Follow up with this office or with primary care if symptoms are persisting.  Follow up in the ER for high fever, trouble swallowing, trouble breathing, other concerning symptoms.     ED Prescriptions    Medication Sig Dispense Auth. Provider   furosemide (LASIX) 20 MG tablet Take 1 tablet (20 mg total) by mouth daily. 30 tablet Moshe Cipro, NP     PDMP not reviewed this encounter.   Moshe Cipro, NP 05/05/20 1335

## 2020-05-05 NOTE — ED Triage Notes (Signed)
Shortness of breath and swelling to bilateral ankles x 2 days.  Pt has been told she has chf in the past but does no take any fluid medicines.

## 2020-05-05 NOTE — Discharge Instructions (Addendum)
Your xray looked good today. There is no fluid accumulation in your lungs.  I have sent in lasix for you to take once daily in the morning for leg swelling.  Follow up with cardiology   Follow up with this office or with primary care if symptoms are persisting.  Follow up in the ER for high fever, trouble swallowing, trouble breathing, other concerning symptoms.

## 2020-05-06 ENCOUNTER — Other Ambulatory Visit: Payer: Self-pay | Admitting: Family Medicine

## 2020-05-09 ENCOUNTER — Other Ambulatory Visit: Payer: Self-pay | Admitting: Family Medicine

## 2020-05-12 ENCOUNTER — Ambulatory Visit
Admission: EM | Admit: 2020-05-12 | Discharge: 2020-05-12 | Disposition: A | Discharge status: Home / Self Care | Payer: Medicare Other | Attending: Emergency Medicine | Admitting: Emergency Medicine

## 2020-05-12 ENCOUNTER — Encounter: Payer: Self-pay | Admitting: Emergency Medicine

## 2020-05-12 ENCOUNTER — Other Ambulatory Visit: Payer: Self-pay

## 2020-05-12 DIAGNOSIS — S2231XA Fracture of one rib, right side, initial encounter for closed fracture: Secondary | ICD-10-CM

## 2020-05-12 DIAGNOSIS — R059 Cough, unspecified: Secondary | ICD-10-CM

## 2020-05-12 MED ORDER — BENZONATATE 100 MG PO CAPS: 100.0000 mg | ORAL_CAPSULE | Freq: Three times a day (TID) | ORAL | 0 refills | Status: DC

## 2020-05-12 MED ORDER — IBUPROFEN 800 MG PO TABS: 800.0000 mg | ORAL_TABLET | Freq: Three times a day (TID) | ORAL | 0 refills | Status: DC

## 2020-05-12 NOTE — ED Triage Notes (Signed)
C/o cough since yesterday non productive, states right side hurts when she coughs

## 2020-05-12 NOTE — ED Provider Notes (Addendum)
[facility]   [id number] 05/12/20 Arrival Time: 0801   Chief Complaint  Patient presents with  . Cough     SUBJECTIVE: History from: patient.  [name] is a 85 y.o. female who presented to the urgent care with a complaint of right side rib cage pain after coughing for the past few weeks.  Report she has a broken rib on the right side.  Developed the symptom after falling.  Has not tried any OTC medication.  Report worsening symptoms with respiration and coughing.  Reports similar symptoms in the past that self resolve.  Denies fever, chills, fatigue, sinus pain, rhinorrhea, sore throat, SOB, wheezing, chest pain, nausea, changes in bowel or bladder habits.    ROS: As per HPI.  All other pertinent ROS negative.     Past Medical History:  Diagnosis Date  . Arthritis    OA  . DDD (degenerative disc disease), lumbosacral   . Diabetes mellitus without complication ([place])   . Distal radius fracture    bilateral  . GERD (gastroesophageal reflux disease)   . Glaucoma   . HOH (hard of hearing)    left ear  . Hypertension   . PONV (postoperative nausea and vomiting)    Past Surgical History:  Procedure Laterality Date  . ABDOMINAL HYSTERECTOMY    . BREAST BIOPSY    . BREAST EXCISIONAL BIOPSY Left    benign  . BREAST SURGERY     left milk duct   . EYE SURGERY     cataract  . FRACTURE SURGERY Left    hip fracture  . OPEN REDUCTION INTERNAL FIXATION (ORIF) DISTAL RADIAL FRACTURE Bilateral 05/24/2015   Procedure: OPEN REDUCTION INTERNAL FIXATION (ORIF) BILATERAL DISTAL RADIAL FRACTURE VOLAR PLATE;  Surgeon: Daryll Brod, MD;  Location: [facility];  Service: Orthopedics;  Laterality: Bilateral;   Allergies  Allergen Reactions  . Codeine Nausea Only   No current facility-administered medications on file prior to encounter.   Current Outpatient Medications on File Prior to Encounter  Medication Sig Dispense Refill  . amLODipine (NORVASC) 10 MG tablet  Take 1 tablet (10 mg total) by mouth daily. 90 tablet 3  . aspirin 81 MG tablet Take 81 mg by mouth daily.    Marland Kitchen atorvastatin (LIPITOR) 20 MG tablet Take 1 tablet (20 mg total) by mouth daily. 90 tablet 3  . calcium-vitamin D (OSCAL WITH D) 500-200 MG-UNIT tablet Take 1 tablet by mouth.    . DEXILANT 60 MG capsule Take 1 capsule by mouth daily. (Patient not taking: Reported on 03/05/2020)    . fluticasone (FLONASE) 50 MCG/ACT nasal spray Place into both nostrils daily.    . furosemide (LASIX) 20 MG tablet Take 1 tablet (20 mg total) by mouth daily. 30 tablet 0  . glipiZIDE (GLUCOTROL) 10 MG tablet TAKE 1 TABLET BY MOUTH ONCE DAILY BEFORE BREAKFAST 90 tablet 0  . glucose blood (ONETOUCH ULTRA) test strip Test BS twice daily Dx E11.9 200 each 3  . hydrALAZINE (APRESOLINE) 25 MG tablet Take 3 tablets (75 mg total) by mouth every 8 (eight) hours. 90 tablet 1  . hydrochlorothiazide (HYDRODIURIL) 25 MG tablet Take 1 tablet by mouth once daily 30 tablet 5  . ibandronate (BONIVA) 150 MG tablet Take 150 mg by mouth every 30 (thirty) days. Take in the morning with a full glass of water, on an empty stomach, and do not take anything else by mouth or lie down for the next 30 min.    Marland Kitchen  JANUMET 50-1000 MG tablet TAKE 1 TABLET BY MOUTH TWICE DAILY WITH A MEAL 180 tablet 0  . LANTUS SOLOSTAR 100 UNIT/ML Solostar Pen INJECT 16 UNITS SUBCUTANEOUSLY AT BEDTIME 15 mL 0  . latanoprost (XALATAN) 0.005 % ophthalmic solution 1 drop at bedtime.    Marland Kitchen lisinopril (ZESTRIL) 40 MG tablet Take 1 tablet (40 mg total) by mouth daily. 90 tablet 1  . metoprolol tartrate (LOPRESSOR) 50 MG tablet Take 1 tablet (50 mg total) by mouth 2 (two) times daily. 180 tablet 3  . multivitamin-iron-minerals-folic acid (CENTRUM) chewable tablet Chew 1 tablet by mouth daily.    . potassium chloride (KLOR-CON) 10 MEQ tablet Take 1 tablet (10 mEq total) by mouth daily. 30 tablet 1  . timolol (TIMOPTIC) 0.5 % ophthalmic solution INSTILL 1 DROP INTO  EACH EYE ONCE DAILY    . tizanidine (ZANAFLEX) 2 MG capsule Take 1 capsule (2 mg total) by mouth 3 (three) times daily as needed for muscle spasms. 30 capsule 0  . traMADol (ULTRAM) 50 MG tablet Take 0.5 tablets (25 mg total) by mouth every 6 (six) hours as needed for moderate pain. 12 tablet 0   Social History   Socioeconomic History  . Marital status: Widowed    Spouse name: Not on file  . Number of children: 0  . Years of education: 7  . Highest education level: 7th grade  Occupational History  . Not on file  Tobacco Use  . Smoking status: Never Smoker  . Smokeless tobacco: Never Used  Vaping Use  . Vaping Use: Never used  Substance and Sexual Activity  . Alcohol use: No  . Drug use: No  . Sexual activity: Not Currently  Other Topics Concern  . Not on file  Social History Narrative  . Not on file   Social Determinants of Health   Financial Resource Strain: Low Risk   . Difficulty of Paying Living Expenses: Not hard at all  Food Insecurity: No Food Insecurity  . Worried About Programme researcher, broadcasting/film/video in the Last Year: Never true  . Ran Out of Food in the Last Year: Never true  Transportation Needs: No Transportation Needs  . Lack of Transportation (Medical): No  . Lack of Transportation (Non-Medical): No  Physical Activity: Sufficiently Active  . Days of Exercise per Week: 5 days  . Minutes of Exercise per Session: 60 min  Stress: No Stress Concern Present  . Feeling of Stress : Not at all  Social Connections: Moderately Integrated  . Frequency of Communication with Friends and Family: More than three times a week  . Frequency of Social Gatherings with Friends and Family: More than three times a week  . Attends Religious Services: More than 4 times per year  . Active Member of Clubs or Organizations: Yes  . Attends Banker Meetings: More than 4 times per year  . Marital Status: Widowed  Intimate Partner Violence: Not At Risk  . Fear of Current or  Ex-Partner: No  . Emotionally Abused: No  . Physically Abused: No  . Sexually Abused: No   Family History  Problem Relation Age of Onset  . Heart disease Mother   . Heart disease Father     OBJECTIVE:  Vitals:   05/12/20 0813 05/12/20 0814  BP: (!) 200/96   Pulse: 69   Resp: 18   Temp: 98 F (36.7 C)   TempSrc: Oral   SpO2: 92%   Weight:  133 lb (60.3 kg)  Height:  5\' 2"  (1.575 m)     General appearance: alert; appears fatigued, but nontoxic; speaking in full sentences and tolerating own secretions HEENT: NCAT; Ears: EACs clear, TMs pearly gray; Eyes: PERRL.  EOM grossly intact. Sinuses: nontender; Nose: nares patent without rhinorrhea, Throat: oropharynx clear, tonsils non erythematous or enlarged, uvula midline  Neck: supple without LAD Lungs: unlabored respirations, symmetrical air entry; cough: moderate; no respiratory distress; CTAB Heart: regular rate and rhythm.  Radial pulses 2+ symmetrical bilaterally Skin: warm and dry Psychological: alert and cooperative; normal mood and affect  LABS:  No results found for this or any previous visit (from the past 24 hour(s)).   RADIOLOGY:  DG Chest 2 View  Result Date: 05/05/2020 CLINICAL DATA:  Shortness of breath with lower extremity edema EXAM: CHEST - 2 VIEW COMPARISON:  April 02, 2020 and March 11, 2020 FINDINGS: There is no edema or airspace opacity. Heart size and pulmonary vascularity are normal. Calcification noted in the mitral annulus. No adenopathy. Prior right lateral seventh rib fracture again noted. There are lower thoracic and upper lumbar compression fractures with increased kyphosis near the thoracolumbar junction. There is aortic atherosclerosis. IMPRESSION: No edema or airspace opacity. Stable cardiac silhouette. Stable bony changes. Aortic Atherosclerosis (ICD10-I70.0). Electronically Signed   By: Lowella Grip III M.D.   On: 05/05/2020 08:43   DG Chest 2 View  Result Date: 03/14/2020 CLINICAL  DATA:  Rib and chest pain. EXAM: CHEST - 2 VIEW COMPARISON:  March 05, 2020. FINDINGS: The heart size and mediastinal contours are within normal limits. No pneumothorax or pleural effusion is noted. Hypoinflation of the lungs is noted with minimal bibasilar subsegmental atelectasis. No definite rib fractures are noted. Multiple old thoracic vertebral body compression fractures are noted. IMPRESSION: Hypoinflation of the lungs with minimal bibasilar subsegmental atelectasis. Electronically Signed   By: Marijo Conception M.D.   On: 03/14/2020 08:54   DG Ribs Unilateral W/Chest Right  Result Date: 04/02/2020 CLINICAL DATA:  85 year old female with fall and right chest wall pain. EXAM: RIGHT RIBS AND CHEST - 3+ VIEW COMPARISON:  Chest radiograph dated 03/12/2019. FINDINGS: Shallow inspiration with bibasilar atelectasis. No focal consolidation, pleural effusion or pneumothorax. The cardiac silhouette is within limits. Calcified mitral annulus. Atherosclerotic calcification of the aorta. Osteopenia with degenerative changes of the spine. Age indeterminate fracture of the posterolateral right seventh rib which appears new since the prior radiograph. Additional old appearing fracture of the lateral aspect of the right lower rib. IMPRESSION: Age indeterminate fracture of the posterolateral right seventh rib. No pneumothorax. Electronically Signed   By: Anner Crete M.D.   On: 04/02/2020 17:37   DG Ribs Unilateral W/Chest Right  Result Date: 03/07/2020 CLINICAL DATA:  Cough, right chest pain EXAM: RIGHT RIBS AND CHEST - 3+ VIEW COMPARISON:  12/10/2019 FINDINGS: Lung volumes are extremely small, but are symmetric and are clear. No pneumothorax or pleural effusion. Cardiac size within normal limits. Pulmonary vascularity is normal. Healed rib fractures are noted bilaterally. No acute rib fracture or destructive osseous lesion identified. No acute bone abnormality. IMPRESSION: Marked pulmonary hypoinflation.  Electronically Signed   By: Fidela Salisbury MD   On: 03/07/2020 03:16    I have reviewed all previous x-ray.    ASSESSMENT & PLAN:  1. Closed fracture of one rib of right side, initial encounter   2. Cough     Meds ordered this encounter  Medications  . benzonatate (TESSALON) 100 MG capsule    Sig: Take 1 capsule (100  mg total) by mouth every 8 (eight) hours.    Dispense:  30 capsule    Refill:  0  . ibuprofen (ADVIL) 800 MG tablet    Sig: Take 1 tablet (800 mg total) by mouth 3 (three) times daily. Take with food    Dispense:  21 tablet    Refill:  0   Patient is stable at discharge.  Symptom is likely from 7 rib fracture on the right side.  I have reviewed prior x-ray.  Tessalon Perles prescribed for cough and ibuprofen was prescribed for pain.  She was advised to follow-up with PCP.  To return for worsening of symptoms.  Discharge instruction   Get plenty of rest and push fluids Tessalon Perles prescribed for cough Ibuprofen was prescribed for pain Follow with PCP/orthopedic Call or go to the ED if you have any new or worsening symptoms such as fever, worsening cough, shortness of breath, chest tightness, chest pain, turning blue, changes in mental status, etc...   Reviewed expectations re: course of current medical issues. Questions answered. Outlined signs and symptoms indicating need for more acute intervention. Patient verbalized understanding. After Visit Summary given.         Emerson Monte, [place] 05/12/20 [place], [place], [place] 05/12/20 [phone]

## 2020-05-12 NOTE — Discharge Instructions (Addendum)
Get plenty of rest and push fluids Tessalon Perles prescribed for cough Ibuprofen was prescribed for pain Follow with PCP/orthopedic Call or go to the ED if you have any new or worsening symptoms such as fever, worsening cough, shortness of breath, chest tightness, chest pain, turning blue, changes in mental status, etc..Marland Kitchen

## 2020-05-26 ENCOUNTER — Ambulatory Visit: Payer: Medicare Other | Admitting: Family Medicine

## 2020-05-30 ENCOUNTER — Ambulatory Visit
Admission: EM | Admit: 2020-05-30 | Discharge: 2020-05-30 | Disposition: A | Discharge status: Home / Self Care | Payer: Medicare Other | Attending: Family Medicine | Admitting: Family Medicine

## 2020-05-30 ENCOUNTER — Other Ambulatory Visit: Payer: Self-pay

## 2020-05-30 ENCOUNTER — Encounter: Payer: Self-pay | Admitting: Emergency Medicine

## 2020-05-30 DIAGNOSIS — M25572 Pain in left ankle and joints of left foot: Secondary | ICD-10-CM | POA: Diagnosis not present

## 2020-05-30 DIAGNOSIS — L03116 Cellulitis of left lower limb: Secondary | ICD-10-CM

## 2020-05-30 MED ORDER — SULFAMETHOXAZOLE-TRIMETHOPRIM 800-160 MG PO TABS: 1.0000 | ORAL_TABLET | Freq: Two times a day (BID) | ORAL | 0 refills | Status: AC

## 2020-05-30 NOTE — ED Triage Notes (Signed)
Both lower legs are red.  States her right side is hurting.  States she fell onto her cough while reaching for something over a month ago.

## 2020-05-30 NOTE — Discharge Instructions (Signed)
I have sent in Bactrim for you to take twice a day for 7 days.  Follow up with this office or with primary care if symptoms are persisting.  Follow up in the ER for high fever, trouble swallowing, trouble breathing, other concerning symptoms.  

## 2020-05-30 NOTE — ED Provider Notes (Signed)
[facility]   [id number] 05/30/20 Arrival Time: [id number]  CC: RASH  SUBJECTIVE:  [name] is a 85 y.o. female who presents with a skin complaint that began about a week ago.  Reports that her lower legs are swollen, the left leg is red, swollen, tender to touch.  Denies precipitating event or trauma.  Denies changes in soaps, detergents, close contacts with similar rash, known trigger or environmental trigger, allergy. Denies medications change or starting a new medication recently.  Has not attempted OTC treatment at home.  There are no aggravating or alleviating factors. Denies similar symptoms in the past.   Denies fever, chills, nausea, vomiting,  discharge, oral lesions, SOB, chest pain, abdominal pain, changes in bowel or bladder function.    ROS: As per HPI.  All other pertinent ROS negative.     Past Medical History:  Diagnosis Date  . Arthritis    OA  . DDD (degenerative disc disease), lumbosacral   . Diabetes mellitus without complication ([place])   . Distal radius fracture    bilateral  . GERD (gastroesophageal reflux disease)   . Glaucoma   . HOH (hard of hearing)    left ear  . Hypertension   . PONV (postoperative nausea and vomiting)    Past Surgical History:  Procedure Laterality Date  . ABDOMINAL HYSTERECTOMY    . BREAST BIOPSY    . BREAST EXCISIONAL BIOPSY Left    benign  . BREAST SURGERY     left milk duct   . EYE SURGERY     cataract  . FRACTURE SURGERY Left    hip fracture  . OPEN REDUCTION INTERNAL FIXATION (ORIF) DISTAL RADIAL FRACTURE Bilateral 05/24/2015   Procedure: OPEN REDUCTION INTERNAL FIXATION (ORIF) BILATERAL DISTAL RADIAL FRACTURE VOLAR PLATE;  Surgeon: Daryll Brod, MD;  Location: [facility];  Service: Orthopedics;  Laterality: Bilateral;   Allergies  Allergen Reactions  . Codeine Nausea Only   No current facility-administered medications on file prior to encounter.   Current Outpatient Medications on File Prior to  Encounter  Medication Sig Dispense Refill  . amLODipine (NORVASC) 10 MG tablet Take 1 tablet (10 mg total) by mouth daily. 90 tablet 3  . aspirin 81 MG tablet Take 81 mg by mouth daily.    Marland Kitchen atorvastatin (LIPITOR) 20 MG tablet Take 1 tablet (20 mg total) by mouth daily. 90 tablet 3  . calcium-vitamin D (OSCAL WITH D) 500-200 MG-UNIT tablet Take 1 tablet by mouth.    . DEXILANT 60 MG capsule Take 1 capsule by mouth daily. (Patient not taking: Reported on 03/05/2020)    . fluticasone (FLONASE) 50 MCG/ACT nasal spray Place into both nostrils daily.    . furosemide (LASIX) 20 MG tablet Take 1 tablet (20 mg total) by mouth daily. 30 tablet 0  . glipiZIDE (GLUCOTROL) 10 MG tablet TAKE 1 TABLET BY MOUTH ONCE DAILY BEFORE BREAKFAST 90 tablet 0  . glucose blood (ONETOUCH ULTRA) test strip Test BS twice daily Dx E11.9 200 each 3  . hydrALAZINE (APRESOLINE) 25 MG tablet Take 3 tablets (75 mg total) by mouth every 8 (eight) hours. 90 tablet 1  . hydrochlorothiazide (HYDRODIURIL) 25 MG tablet Take 1 tablet by mouth once daily 30 tablet 5  . ibandronate (BONIVA) 150 MG tablet Take 150 mg by mouth every 30 (thirty) days. Take in the morning with a full glass of water, on an empty stomach, and do not take anything else by mouth or lie down  for the next 30 min.    Marland Kitchen JANUMET 50-1000 MG tablet TAKE 1 TABLET BY MOUTH TWICE DAILY WITH A MEAL 180 tablet 0  . LANTUS SOLOSTAR 100 UNIT/ML Solostar Pen INJECT 16 UNITS SUBCUTANEOUSLY AT BEDTIME 15 mL 0  . latanoprost (XALATAN) 0.005 % ophthalmic solution 1 drop at bedtime.    Marland Kitchen lisinopril (ZESTRIL) 40 MG tablet Take 1 tablet (40 mg total) by mouth daily. 90 tablet 1  . metoprolol tartrate (LOPRESSOR) 50 MG tablet Take 1 tablet (50 mg total) by mouth 2 (two) times daily. 180 tablet 3  . multivitamin-iron-minerals-folic acid (CENTRUM) chewable tablet Chew 1 tablet by mouth daily.    . potassium chloride (KLOR-CON) 10 MEQ tablet Take 1 tablet (10 mEq total) by mouth daily. 30  tablet 1  . timolol (TIMOPTIC) 0.5 % ophthalmic solution INSTILL 1 DROP INTO EACH EYE ONCE DAILY    . tizanidine (ZANAFLEX) 2 MG capsule Take 1 capsule (2 mg total) by mouth 3 (three) times daily as needed for muscle spasms. 30 capsule 0  . traMADol (ULTRAM) 50 MG tablet Take 0.5 tablets (25 mg total) by mouth every 6 (six) hours as needed for moderate pain. 12 tablet 0   Social History   Socioeconomic History  . Marital status: Widowed    Spouse name: Not on file  . Number of children: 0  . Years of education: 7  . Highest education level: 7th grade  Occupational History  . Not on file  Tobacco Use  . Smoking status: Never Smoker  . Smokeless tobacco: Never Used  Vaping Use  . Vaping Use: Never used  Substance and Sexual Activity  . Alcohol use: No  . Drug use: No  . Sexual activity: Not Currently  Other Topics Concern  . Not on file  Social History Narrative  . Not on file   Social Determinants of Health   Financial Resource Strain: Low Risk   . Difficulty of Paying Living Expenses: Not hard at all  Food Insecurity: No Food Insecurity  . Worried About Charity fundraiser in the Last Year: Never true  . Ran Out of Food in the Last Year: Never true  Transportation Needs: No Transportation Needs  . Lack of Transportation (Medical): No  . Lack of Transportation (Non-Medical): No  Physical Activity: Sufficiently Active  . Days of Exercise per Week: 5 days  . Minutes of Exercise per Session: 60 min  Stress: No Stress Concern Present  . Feeling of Stress : Not at all  Social Connections: Moderately Integrated  . Frequency of Communication with Friends and Family: More than three times a week  . Frequency of Social Gatherings with Friends and Family: More than three times a week  . Attends Religious Services: More than 4 times per year  . Active Member of Clubs or Organizations: Yes  . Attends Archivist Meetings: More than 4 times per year  . Marital Status:  Widowed  Intimate Partner Violence: Not At Risk  . Fear of Current or Ex-Partner: No  . Emotionally Abused: No  . Physically Abused: No  . Sexually Abused: No   Family History  Problem Relation Age of Onset  . Heart disease Mother   . Heart disease Father     OBJECTIVE: Vitals:   05/30/20 1504 05/30/20 1506  BP: (S) (!) 220/77   Pulse: 77   Resp: 18   Temp: 98.7 F (37.1 C)   TempSrc: Oral   SpO2: 94%  Weight:  132 lb (59.9 kg)  Height:  5\' 2"  (1.575 m)    General appearance: alert; no distress Head: NCAT Lungs: clear to auscultation bilaterally Heart: regular rate and rhythm.  Radial pulse 2+ bilaterally Extremities: no edema Skin: warm and dry; left lower leg erythematous, mildly swollen, warm and tender to touch  psychological: alert and cooperative; normal mood and affect  ASSESSMENT & PLAN:  1. Cellulitis of left lower extremity   2. Acute left ankle pain     Meds ordered this encounter  Medications  . sulfamethoxazole-trimethoprim (BACTRIM DS) 800-160 MG tablet    Sig: Take 1 tablet by mouth 2 (two) times daily for 7 days.    Dispense:  14 tablet    Refill:  0    Order Specific Question:   Supervising Provider    Answer:   Chase Picket [[id number]]   Prescribed Bactrim DS twice daily x7 days  take as prescribed and to completion Avoid hot showers/ baths Moisturize skin daily  Follow up with PCP if symptoms persists Return or go to the ER if you have any new or worsening symptoms such as fever, chills, nausea, vomiting, redness, swelling, discharge, if symptoms do not improve with medications Discussed elevated blood pressure with patient today, she reports that every time she goes to the doctor it is this high. States that she does not run this high at home and has been taking her medications. Reviewed expectations re: course of current medical issues. Questions answered. Outlined signs and symptoms indicating need for more acute  intervention. Patient verbalized understanding. After Visit Summary given.   Faustino Congress, NP 06/02/20 1001

## 2020-05-31 ENCOUNTER — Other Ambulatory Visit: Payer: Self-pay | Admitting: Family Medicine

## 2020-05-31 ENCOUNTER — Other Ambulatory Visit: Payer: Medicare Other

## 2020-05-31 DIAGNOSIS — R7989 Other specified abnormal findings of blood chemistry: Secondary | ICD-10-CM | POA: Diagnosis not present

## 2020-05-31 DIAGNOSIS — I152 Hypertension secondary to endocrine disorders: Secondary | ICD-10-CM

## 2020-05-31 DIAGNOSIS — E1169 Type 2 diabetes mellitus with other specified complication: Secondary | ICD-10-CM

## 2020-05-31 DIAGNOSIS — E1159 Type 2 diabetes mellitus with other circulatory complications: Secondary | ICD-10-CM

## 2020-05-31 DIAGNOSIS — E785 Hyperlipidemia, unspecified: Secondary | ICD-10-CM | POA: Diagnosis not present

## 2020-05-31 DIAGNOSIS — R809 Proteinuria, unspecified: Secondary | ICD-10-CM

## 2020-05-31 DIAGNOSIS — E1165 Type 2 diabetes mellitus with hyperglycemia: Secondary | ICD-10-CM

## 2020-05-31 DIAGNOSIS — Z794 Long term (current) use of insulin: Secondary | ICD-10-CM | POA: Diagnosis not present

## 2020-05-31 LAB — BAYER DCA HB A1C WAIVED: HB A1C (BAYER DCA - WAIVED): 6.5 % (ref <7.0)

## 2020-06-01 LAB — CMP14+EGFR
ALT: 12 IU/L (ref 0–32)
AST: 17 IU/L (ref 0–40)
Albumin/Globulin Ratio: 1.2 (ref 1.2–2.2)
Albumin: 3.8 g/dL (ref 3.6–4.6)
Alkaline Phosphatase: 95 IU/L (ref 44–121)
BUN/Creatinine Ratio: 20 (ref 12–28)
BUN: 18 mg/dL (ref 8–27)
Bilirubin Total: 0.5 mg/dL (ref 0.0–1.2)
CO2: 25 mmol/L (ref 20–29)
Calcium: 9.2 mg/dL (ref 8.7–10.3)
Chloride: 93 mmol/L — ABNORMAL LOW (ref 96–106)
Creatinine, Ser: 0.9 mg/dL (ref 0.57–1.00)
GFR calc Af Amer: 69 mL/min/{1.73_m2} (ref >59)
GFR calc non Af Amer: 60 mL/min/{1.73_m2} (ref >59)
Globulin, Total: 3.2 g/dL (ref 1.5–4.5)
Glucose: 121 mg/dL — ABNORMAL HIGH (ref 65–99)
Potassium: 4.1 mmol/L (ref 3.5–5.2)
Sodium: 135 mmol/L (ref 134–144)
Total Protein: 7 g/dL (ref 6.0–8.5)

## 2020-06-01 LAB — CBC WITH DIFFERENTIAL/PLATELET
Basophils Absolute: 0.1 10*3/uL (ref 0.0–0.2)
Basos: 1 %
EOS (ABSOLUTE): 0.2 10*3/uL (ref 0.0–0.4)
Eos: 1 %
Hematocrit: 36.4 % (ref 34.0–46.6)
Hemoglobin: 11.5 g/dL (ref 11.1–15.9)
Immature Grans (Abs): 0 10*3/uL (ref 0.0–0.1)
Immature Granulocytes: 0 %
Lymphocytes Absolute: 0.8 10*3/uL (ref 0.7–3.1)
Lymphs: 7 %
MCH: 26.6 pg (ref 26.6–33.0)
MCHC: 31.6 g/dL (ref 31.5–35.7)
MCV: 84 fL (ref 79–97)
Monocytes Absolute: 1 10*3/uL — ABNORMAL HIGH (ref 0.1–0.9)
Monocytes: 9 %
Neutrophils Absolute: 9.1 10*3/uL — ABNORMAL HIGH (ref 1.4–7.0)
Neutrophils: 82 %
Platelets: 393 10*3/uL (ref 150–450)
RBC: 4.33 x10E6/uL (ref 3.77–5.28)
RDW: 16.1 % — ABNORMAL HIGH (ref 11.7–15.4)
WBC: 11.2 10*3/uL — ABNORMAL HIGH (ref 3.4–10.8)

## 2020-06-01 LAB — LIPID PANEL
Chol/HDL Ratio: 1.9 ratio (ref 0.0–4.4)
Cholesterol, Total: 88 mg/dL — ABNORMAL LOW (ref 100–199)
HDL: 46 mg/dL (ref >39)
LDL Chol Calc (NIH): 27 mg/dL (ref 0–99)
Triglycerides: 67 mg/dL (ref 0–149)
VLDL Cholesterol Cal: 15 mg/dL (ref 5–40)

## 2020-06-01 LAB — TSH: TSH: 2.67 u[IU]/mL (ref 0.450–4.500)

## 2020-06-02 ENCOUNTER — Ambulatory Visit (INDEPENDENT_AMBULATORY_CARE_PROVIDER_SITE_OTHER): Payer: Medicare Other | Admitting: Family Medicine

## 2020-06-02 ENCOUNTER — Other Ambulatory Visit: Payer: Self-pay

## 2020-06-02 ENCOUNTER — Encounter: Payer: Self-pay | Admitting: Family Medicine

## 2020-06-02 ENCOUNTER — Ambulatory Visit (INDEPENDENT_AMBULATORY_CARE_PROVIDER_SITE_OTHER): Payer: Medicare Other

## 2020-06-02 VITALS — BP 167/60 | HR 73 | Temp 97.6°F | Ht 62.0 in | Wt 133.0 lb

## 2020-06-02 DIAGNOSIS — R0602 Shortness of breath: Secondary | ICD-10-CM

## 2020-06-02 DIAGNOSIS — Z794 Long term (current) use of insulin: Secondary | ICD-10-CM | POA: Diagnosis not present

## 2020-06-02 DIAGNOSIS — S2231XD Fracture of one rib, right side, subsequent encounter for fracture with routine healing: Secondary | ICD-10-CM | POA: Diagnosis not present

## 2020-06-02 DIAGNOSIS — E785 Hyperlipidemia, unspecified: Secondary | ICD-10-CM | POA: Diagnosis not present

## 2020-06-02 DIAGNOSIS — I152 Hypertension secondary to endocrine disorders: Secondary | ICD-10-CM

## 2020-06-02 DIAGNOSIS — E1159 Type 2 diabetes mellitus with other circulatory complications: Secondary | ICD-10-CM | POA: Diagnosis not present

## 2020-06-02 DIAGNOSIS — R600 Localized edema: Secondary | ICD-10-CM

## 2020-06-02 DIAGNOSIS — E1169 Type 2 diabetes mellitus with other specified complication: Secondary | ICD-10-CM

## 2020-06-02 DIAGNOSIS — D72829 Elevated white blood cell count, unspecified: Secondary | ICD-10-CM | POA: Diagnosis not present

## 2020-06-02 DIAGNOSIS — I509 Heart failure, unspecified: Secondary | ICD-10-CM | POA: Diagnosis not present

## 2020-06-02 DIAGNOSIS — M7989 Other specified soft tissue disorders: Secondary | ICD-10-CM | POA: Diagnosis not present

## 2020-06-02 MED ORDER — FUROSEMIDE 20 MG PO TABS: 20.0000 mg | ORAL_TABLET | Freq: Two times a day (BID) | ORAL | 0 refills | Status: DC

## 2020-06-02 MED ORDER — METOPROLOL TARTRATE 50 MG PO TABS: 50.0000 mg | ORAL_TABLET | Freq: Two times a day (BID) | ORAL | 3 refills | Status: DC

## 2020-06-02 MED ORDER — GLIPIZIDE 10 MG PO TABS: ORAL_TABLET | ORAL | 3 refills | Status: DC

## 2020-06-02 MED ORDER — ATORVASTATIN CALCIUM 20 MG PO TABS
10.0000 mg | ORAL_TABLET | Freq: Every day | ORAL | 3 refills | Status: DC
Start: 2020-06-02 — End: 2020-10-18

## 2020-06-02 MED ORDER — JANUMET 50-1000 MG PO TABS: ORAL_TABLET | ORAL | 3 refills | Status: DC

## 2020-06-02 NOTE — Progress Notes (Signed)
Subjective: CC: DM, HTN, HLD PCP: Janora Norlander, DO XIP:[name] is a 85 y.o. female presenting to clinic today for:  1. Type 2 Diabetes with hypertension, hyperlipidemia; SOB, Edema, cellulitis:  Patient is compliant with her medications but has stopped using the Lantus for the last couple of weeks because it was causing hypoglycemic episodes.  Her average fasting blood sugars have been somewhere between 80s and 120s.  Does not report any hyperglycemic episodes since discontinuing Lantus.  She has been continuing her glipizide and Janumet without difficulty as well as her several blood pressure medications.  She reports some lower extremity edema and dyspnea on exertion.  This is been ongoing for about a month now.  She was evaluated emergency department and had what appeared to be a CHF exacerbation.  She was started on Lasix in addition to her other medications advised to follow-up.  She has not yet seen her cardiologist but is expecting to see them next month.  She had yet another evaluation emergency department shortly after the December 1 and was found to have a right rib fracture.  She continues to have right lateral rib pain.  No hemoptysis.  No change in shortness of breath.  3 days ago she was seen at the urgent care and found to have a leg cellulitis and was subsequently started on Septra.  She feels that this may be increasing her lower extremity edema but she is not sure.  She has been trying to elevate legs but she does note that swelling is present in the morning.  She has not been wearing compression hose because she cannot get them on.  She is been taking the Lasix daily as prescribed.  Last eye exam: needs Last foot exam: UTD Last A1c:  Lab Results  Component Value Date   HGBA1C 6.5 05/31/2020   Nephropathy screen indicated?: n/a Last flu, zoster and/or pneumovax:  Immunization History  Administered Date(s) Administered  . Fluad Quad(high Dose 65+) 02/12/2019   . Influenza Split 02/07/2016, 02/08/2017  . Influenza, High Dose Seasonal PF 01/12/2015, 02/18/2018  . Influenza,inj,Quad PF,6+ Mos 02/20/2018  . Influenza,trivalent, recombinat, inj, PF 02/17/2014, 02/08/2017  . Moderna Sars-Covid-2 Vaccination 07/03/2019, 07/29/2019  . Pneumococcal Conjugate-13 01/12/2015  . Pneumococcal Polysaccharide-23 06/12/2016  . Tdap 06/11/2003, 06/12/2003, 09/17/2017  . Zoster 01/12/2015    ROS: Per HPI  Allergies  Allergen Reactions  . Codeine Nausea Only   Past Medical History:  Diagnosis Date  . Arthritis    OA  . DDD (degenerative disc disease), lumbosacral   . Diabetes mellitus without complication ([place])   . Distal radius fracture    bilateral  . GERD (gastroesophageal reflux disease)   . Glaucoma   . HOH (hard of hearing)    left ear  . Hypertension   . PONV (postoperative nausea and vomiting)     Current Outpatient Medications:  .  amLODipine (NORVASC) 10 MG tablet, Take 1 tablet (10 mg total) by mouth daily., Disp: 90 tablet, Rfl: 3 .  aspirin 81 MG tablet, Take 81 mg by mouth daily., Disp: , Rfl:  .  atorvastatin (LIPITOR) 20 MG tablet, Take 1 tablet (20 mg total) by mouth daily., Disp: 90 tablet, Rfl: 3 .  benzonatate (TESSALON) 100 MG capsule, Take 1 capsule (100 mg total) by mouth every 8 (eight) hours., Disp: 30 capsule, Rfl: 0 .  calcium-vitamin D (OSCAL WITH D) 500-200 MG-UNIT tablet, Take 1 tablet by mouth., Disp: , Rfl:  .  DEXILANT  60 MG capsule, Take 1 capsule by mouth daily. (Patient not taking: Reported on 03/05/2020), Disp: , Rfl:  .  fluticasone (FLONASE) 50 MCG/ACT nasal spray, Place into both nostrils daily., Disp: , Rfl:  .  furosemide (LASIX) 20 MG tablet, Take 1 tablet (20 mg total) by mouth daily., Disp: 30 tablet, Rfl: 0 .  glipiZIDE (GLUCOTROL) 10 MG tablet, TAKE 1 TABLET BY MOUTH ONCE DAILY BEFORE BREAKFAST, Disp: 90 tablet, Rfl: 0 .  glucose blood (ONETOUCH ULTRA) test strip, Test BS twice daily Dx E11.9, Disp:  200 each, Rfl: 3 .  hydrALAZINE (APRESOLINE) 25 MG tablet, Take 3 tablets (75 mg total) by mouth every 8 (eight) hours., Disp: 90 tablet, Rfl: 1 .  hydrochlorothiazide (HYDRODIURIL) 25 MG tablet, Take 1 tablet by mouth once daily, Disp: 30 tablet, Rfl: 5 .  ibandronate (BONIVA) 150 MG tablet, Take 150 mg by mouth every 30 (thirty) days. Take in the morning with a full glass of water, on an empty stomach, and do not take anything else by mouth or lie down for the next 30 min., Disp: , Rfl:  .  ibuprofen (ADVIL) 800 MG tablet, Take 1 tablet (800 mg total) by mouth 3 (three) times daily. Take with food, Disp: 21 tablet, Rfl: 0 .  JANUMET 50-1000 MG tablet, TAKE 1 TABLET BY MOUTH TWICE DAILY WITH A MEAL, Disp: 180 tablet, Rfl: 0 .  LANTUS SOLOSTAR 100 UNIT/ML Solostar Pen, INJECT 16 UNITS SUBCUTANEOUSLY AT BEDTIME, Disp: 15 mL, Rfl: 0 .  latanoprost (XALATAN) 0.005 % ophthalmic solution, 1 drop at bedtime., Disp: , Rfl:  .  lisinopril (ZESTRIL) 40 MG tablet, Take 1 tablet (40 mg total) by mouth daily., Disp: 90 tablet, Rfl: 1 .  metoprolol tartrate (LOPRESSOR) 50 MG tablet, Take 1 tablet (50 mg total) by mouth 2 (two) times daily., Disp: 180 tablet, Rfl: 3 .  multivitamin-iron-minerals-folic acid (CENTRUM) chewable tablet, Chew 1 tablet by mouth daily., Disp: , Rfl:  .  potassium chloride (KLOR-CON) 10 MEQ tablet, Take 1 tablet (10 mEq total) by mouth daily., Disp: 30 tablet, Rfl: 1 .  sulfamethoxazole-trimethoprim (BACTRIM DS) 800-160 MG tablet, Take 1 tablet by mouth 2 (two) times daily for 7 days., Disp: 14 tablet, Rfl: 0 .  timolol (TIMOPTIC) 0.5 % ophthalmic solution, INSTILL 1 DROP INTO EACH EYE ONCE DAILY, Disp: , Rfl:  .  tizanidine (ZANAFLEX) 2 MG capsule, Take 1 capsule (2 mg total) by mouth 3 (three) times daily as needed for muscle spasms., Disp: 30 capsule, Rfl: 0 .  traMADol (ULTRAM) 50 MG tablet, Take 0.5 tablets (25 mg total) by mouth every 6 (six) hours as needed for moderate pain., Disp:  12 tablet, Rfl: 0 Social History   Socioeconomic History  . Marital status: Widowed    Spouse name: Not on file  . Number of children: 0  . Years of education: 7  . Highest education level: 7th grade  Occupational History  . Not on file  Tobacco Use  . Smoking status: Never Smoker  . Smokeless tobacco: Never Used  Vaping Use  . Vaping Use: Never used  Substance and Sexual Activity  . Alcohol use: No  . Drug use: No  . Sexual activity: Not Currently  Other Topics Concern  . Not on file  Social History Narrative  . Not on file   Social Determinants of Health   Financial Resource Strain: Low Risk   . Difficulty of Paying Living Expenses: Not hard at all  Food Insecurity:  No Food Insecurity  . Worried About Charity fundraiser in the Last Year: Never true  . Ran Out of Food in the Last Year: Never true  Transportation Needs: No Transportation Needs  . Lack of Transportation (Medical): No  . Lack of Transportation (Non-Medical): No  Physical Activity: Sufficiently Active  . Days of Exercise per Week: 5 days  . Minutes of Exercise per Session: 60 min  Stress: No Stress Concern Present  . Feeling of Stress : Not at all  Social Connections: Moderately Integrated  . Frequency of Communication with Friends and Family: More than three times a week  . Frequency of Social Gatherings with Friends and Family: More than three times a week  . Attends Religious Services: More than 4 times per year  . Active Member of Clubs or Organizations: Yes  . Attends Archivist Meetings: More than 4 times per year  . Marital Status: Widowed  Intimate Partner Violence: Not At Risk  . Fear of Current or Ex-Partner: No  . Emotionally Abused: No  . Physically Abused: No  . Sexually Abused: No   Family History  Problem Relation Age of Onset  . Heart disease Mother   . Heart disease Father     Objective: Office vital signs reviewed. BP (!) 167/60   Pulse 73   Temp 97.6 F (36.4  C) (Temporal)   Ht 5\' 2"  (1.575 m)   Wt 133 lb (60.3 kg)   SpO2 98%   BMI 24.33 kg/m   Physical Examination:  General: Awake, alert, nontoxic, No acute distress HEENT: Normal, sclera white Cardio: regular rate and rhythm, S1S2 heard, no murmurs appreciated Pulm: clear to auscultation bilaterally, no wheezes, rhonchi or rales; normal work of breathing on room air Extremities: warm, well perfused, 2+ pitting edema to mid shins, no cyanosis or clubbing;  MSK: slow gait and hunched station with moderate kyphosis of Thoracics Skin: No appreciable erythema or skin breakdown noted in the lower extremities  Assessment/ Plan: 85 y.o. female   Bilateral lower extremity edema - Plan: CBC with Differential, Brain natriuretic peptide, DG Chest 2 View, furosemide (LASIX) 20 MG tablet  Congestive heart failure, unspecified HF chronicity, unspecified heart failure type (HCC) - Plan: CBC with Differential, Brain natriuretic peptide, DG Chest 2 View, furosemide (LASIX) 20 MG tablet  Closed fracture of one rib of right side with routine healing, subsequent encounter - Plan: DG Chest 2 View  Shortness of breath - Plan: DG Chest 2 View  Controlled type 2 diabetes mellitus with other specified complication, with long-term current use of insulin (HCC)  Hypertension associated with diabetes ([place])  Hyperlipidemia associated with type 2 diabetes mellitus (HCC)  Leukocytosis, unspecified type  I worry that her edema and shortness of breath are evidence of congestive heart failure.  Her lung exam was totally normal.  She does have pronounced thoracic kyphosis and I do wonder if this impacts her ability to breathe as well.  I have ordered a BNP, stat chest x-ray, repeat CBC and have increased her Lasix to twice daily dosing.  I have encouraged her to elevate lower extremities, use compression hose as able.  I will also reach out to her cardiologist to see if perhaps we can arrange a sooner evaluation then  06/18/2020.  There was no ongoing cellulitis appreciated on today's exam.  I would like her to continue the Septra as prescribed  We reviewed her labs today.  Overall her sugar has been looking better and  I advised her to stay off the Lantus for now, particularly if it is causing hypoglycemic episodes.  She is to continue monitoring blood sugars very closely.  Blood pressure is not at goal.  I have added an extra dose of Lasix as above.  I will follow her closely again next week for recheck.   No orders of the defined types were placed in this encounter.  No orders of the defined types were placed in this encounter.    Janora Norlander, DO [place] [phone]

## 2020-06-02 NOTE — Patient Instructions (Signed)
INCREASE Lasix to ONE (1) tablet TWICE DAILY (2x per day)  Ok to HOLD Lantus if sugar is normal without it.  See me in 1 week for recheck.

## 2020-06-03 DIAGNOSIS — T07XXXA Unspecified multiple injuries, initial encounter: Secondary | ICD-10-CM | POA: Diagnosis not present

## 2020-06-03 DIAGNOSIS — S0083XA Contusion of other part of head, initial encounter: Secondary | ICD-10-CM | POA: Diagnosis not present

## 2020-06-03 DIAGNOSIS — W101XXA Fall (on)(from) sidewalk curb, initial encounter: Secondary | ICD-10-CM | POA: Diagnosis not present

## 2020-06-03 LAB — CBC WITH DIFFERENTIAL/PLATELET
Basophils Absolute: 0.1 10*3/uL (ref 0.0–0.2)
Basos: 1 %
EOS (ABSOLUTE): 0.2 10*3/uL (ref 0.0–0.4)
Eos: 2 %
Hematocrit: 34.6 % (ref 34.0–46.6)
Hemoglobin: 11.1 g/dL (ref 11.1–15.9)
Immature Grans (Abs): 0 10*3/uL (ref 0.0–0.1)
Immature Granulocytes: 0 %
Lymphocytes Absolute: 1 10*3/uL (ref 0.7–3.1)
Lymphs: 11 %
MCH: 26.3 pg — ABNORMAL LOW (ref 26.6–33.0)
MCHC: 32.1 g/dL (ref 31.5–35.7)
MCV: 82 fL (ref 79–97)
Monocytes Absolute: 1 10*3/uL — ABNORMAL HIGH (ref 0.1–0.9)
Monocytes: 11 %
Neutrophils Absolute: 7 10*3/uL (ref 1.4–7.0)
Neutrophils: 75 %
Platelets: 455 10*3/uL — ABNORMAL HIGH (ref 150–450)
RBC: 4.22 x10E6/uL (ref 3.77–5.28)
RDW: 15.8 % — ABNORMAL HIGH (ref 11.7–15.4)
WBC: 9.3 10*3/uL (ref 3.4–10.8)

## 2020-06-03 LAB — BRAIN NATRIURETIC PEPTIDE: BNP: 833.8 pg/mL — ABNORMAL HIGH (ref 0.0–100.0)

## 2020-06-04 ENCOUNTER — Telehealth: Payer: Self-pay

## 2020-06-04 NOTE — Telephone Encounter (Signed)
-----   Message from Michaelyn Barter, RN sent at 06/04/2020 11:36 AM EST ----- Called patient about scheduling with a PA/NP. Patient only wants to go to [place] office. Will send message to [place] office to see if they can get her in next week. Patient does have follow up in [place] office with Dr. Johnsie Cancel on 06/18/20, if nothing is available.

## 2020-06-04 NOTE — Telephone Encounter (Signed)
Patient was contacted and verbalized understanding of new apt of 06/09/20 at 11:15 am.

## 2020-06-09 ENCOUNTER — Encounter: Payer: Self-pay | Admitting: Family Medicine

## 2020-06-09 ENCOUNTER — Ambulatory Visit (INDEPENDENT_AMBULATORY_CARE_PROVIDER_SITE_OTHER): Payer: Medicare Other | Admitting: Family Medicine

## 2020-06-09 ENCOUNTER — Other Ambulatory Visit: Payer: Self-pay

## 2020-06-09 ENCOUNTER — Ambulatory Visit: Payer: Medicare Other | Admitting: Physician Assistant

## 2020-06-09 VITALS — BP 167/62 | HR 110 | Temp 97.8°F | Ht 62.0 in | Wt 130.0 lb

## 2020-06-09 DIAGNOSIS — W19XXXD Unspecified fall, subsequent encounter: Secondary | ICD-10-CM | POA: Diagnosis not present

## 2020-06-09 DIAGNOSIS — R600 Localized edema: Secondary | ICD-10-CM | POA: Diagnosis not present

## 2020-06-09 DIAGNOSIS — I509 Heart failure, unspecified: Secondary | ICD-10-CM

## 2020-06-09 MED ORDER — FUROSEMIDE 20 MG PO TABS
20.0000 mg | ORAL_TABLET | Freq: Two times a day (BID) | ORAL | 0 refills | Status: DC
Start: 2020-06-09 — End: 2020-06-28

## 2020-06-09 NOTE — Progress Notes (Signed)
Subjective: CC: Follow-up edema PCP: Kelly Norlander, DO UA:[id number] C Mucci is a 85 y.o. female presenting to clinic today for:  1.  Edema Patient noted to have pitting edema on last exam.  She is reporting shortness of breath.  Chest x-ray and BNP were subsequently obtained.  Chest x-ray did not demonstrate any pulmonary edema but BNP was elevated.  She was advanced to Lasix 20 mg twice daily and notes that she did have good urine output with this.  However, dyspnea on exertion did not change.  She will be seeing her cardiologist in about a week or so, despite them having offered her an appointment today.  2.  Fall Patient describes this as a mechanical fall where she was trying to step onto a curb and her toe got caught and she fell onto her face.  She was evaluated but did not get any imaging performed and has not returned to get any done.  She denies any balance issues, memory problems, unilateral weakness or sensory changes.  No severe headache or blurred vision.  No nausea or vomiting.  She has ecchymosis along the left leg and right face   ROS: Per HPI  Allergies  Allergen Reactions  . Codeine Nausea Only   Past Medical History:  Diagnosis Date  . Arthritis    OA  . DDD (degenerative disc disease), lumbosacral   . Diabetes mellitus without complication ([place])   . Distal radius fracture    bilateral  . GERD (gastroesophageal reflux disease)   . Glaucoma   . HOH (hard of hearing)    left ear  . Hypertension   . PONV (postoperative nausea and vomiting)     Current Outpatient Medications:  .  amLODipine (NORVASC) 10 MG tablet, Take 1 tablet (10 mg total) by mouth daily., Disp: 90 tablet, Rfl: 3 .  aspirin 81 MG tablet, Take 81 mg by mouth daily., Disp: , Rfl:  .  atorvastatin (LIPITOR) 20 MG tablet, Take 0.5 tablets (10 mg total) by mouth daily., Disp: 90 tablet, Rfl: 3 .  calcium-vitamin D (OSCAL WITH D) 500-200 MG-UNIT tablet, Take 1 tablet by mouth., Disp: , Rfl:  .   DEXILANT 60 MG capsule, Take 1 capsule by mouth daily., Disp: , Rfl:  .  fluticasone (FLONASE) 50 MCG/ACT nasal spray, Place into both nostrils daily., Disp: , Rfl:  .  furosemide (LASIX) 20 MG tablet, Take 1 tablet (20 mg total) by mouth 2 (two) times daily. x3 days. Then take 1 tablet daily., Disp: 60 tablet, Rfl: 0 .  glipiZIDE (GLUCOTROL) 10 MG tablet, TAKE 1 TABLET BY MOUTH ONCE DAILY BEFORE BREAKFAST, Disp: 90 tablet, Rfl: 3 .  glucose blood (ONETOUCH ULTRA) test strip, Test BS twice daily Dx E11.9, Disp: 200 each, Rfl: 3 .  hydrALAZINE (APRESOLINE) 25 MG tablet, Take 3 tablets (75 mg total) by mouth every 8 (eight) hours., Disp: 90 tablet, Rfl: 1 .  hydrochlorothiazide (HYDRODIURIL) 25 MG tablet, Take 1 tablet by mouth once daily, Disp: 30 tablet, Rfl: 5 .  ibandronate (BONIVA) 150 MG tablet, Take 150 mg by mouth every 30 (thirty) days. Take in the morning with a full glass of water, on an empty stomach, and do not take anything else by mouth or lie down for the next 30 min., Disp: , Rfl:  .  LANTUS SOLOSTAR 100 UNIT/ML Solostar Pen, INJECT 16 UNITS SUBCUTANEOUSLY AT BEDTIME, Disp: 15 mL, Rfl: 0 .  latanoprost (XALATAN) 0.005 % ophthalmic solution, 1 drop  at bedtime., Disp: , Rfl:  .  lisinopril (ZESTRIL) 40 MG tablet, Take 1 tablet (40 mg total) by mouth daily., Disp: 90 tablet, Rfl: 1 .  metoprolol tartrate (LOPRESSOR) 50 MG tablet, Take 1 tablet (50 mg total) by mouth 2 (two) times daily., Disp: 180 tablet, Rfl: 3 .  multivitamin-iron-minerals-folic acid (CENTRUM) chewable tablet, Chew 1 tablet by mouth daily., Disp: , Rfl:  .  potassium chloride (KLOR-CON) 10 MEQ tablet, Take 1 tablet (10 mEq total) by mouth daily., Disp: 30 tablet, Rfl: 1 .  sitaGLIPtin-metformin (JANUMET) 50-1000 MG tablet, TAKE 1 TABLET BY MOUTH TWICE DAILY WITH A MEAL, Disp: 180 tablet, Rfl: 3 .  timolol (TIMOPTIC) 0.5 % ophthalmic solution, INSTILL 1 DROP INTO EACH EYE ONCE DAILY, Disp: , Rfl:  .  tizanidine  (ZANAFLEX) 2 MG capsule, Take 1 capsule (2 mg total) by mouth 3 (three) times daily as needed for muscle spasms., Disp: 30 capsule, Rfl: 0 .  traMADol (ULTRAM) 50 MG tablet, Take 0.5 tablets (25 mg total) by mouth every 6 (six) hours as needed for moderate pain., Disp: 12 tablet, Rfl: 0 Social History   Socioeconomic History  . Marital status: Widowed    Spouse name: Not on file  . Number of children: 0  . Years of education: 7  . Highest education level: 7th grade  Occupational History  . Not on file  Tobacco Use  . Smoking status: Never Smoker  . Smokeless tobacco: Never Used  Vaping Use  . Vaping Use: Never used  Substance and Sexual Activity  . Alcohol use: No  . Drug use: No  . Sexual activity: Not Currently  Other Topics Concern  . Not on file  Social History Narrative  . Not on file   Social Determinants of Health   Financial Resource Strain: Low Risk   . Difficulty of Paying Living Expenses: Not hard at all  Food Insecurity: No Food Insecurity  . Worried About Charity fundraiser in the Last Year: Never true  . Ran Out of Food in the Last Year: Never true  Transportation Needs: No Transportation Needs  . Lack of Transportation (Medical): No  . Lack of Transportation (Non-Medical): No  Physical Activity: Sufficiently Active  . Days of Exercise per Week: 5 days  . Minutes of Exercise per Session: 60 min  Stress: No Stress Concern Present  . Feeling of Stress : Not at all  Social Connections: Moderately Integrated  . Frequency of Communication with Friends and Family: More than three times a week  . Frequency of Social Gatherings with Friends and Family: More than three times a week  . Attends Religious Services: More than 4 times per year  . Active Member of Clubs or Organizations: Yes  . Attends Archivist Meetings: More than 4 times per year  . Marital Status: Widowed  Intimate Partner Violence: Not At Risk  . Fear of Current or Ex-Partner: No  .  Emotionally Abused: No  . Physically Abused: No  . Sexually Abused: No   Family History  Problem Relation Age of Onset  . Heart disease Mother   . Heart disease Father     Objective: Office vital signs reviewed. BP (!) 167/62   Pulse (!) 110   Temp 97.8 F (36.6 C) (Temporal)   Ht 5\' 2"  (1.575 m)   Wt 130 lb (59 kg)   SpO2 98%   BMI 23.78 kg/m   Physical Examination:  General: Awake, alert, well nourished,  No acute distress HEENT: Normal, sclera white Cardio: regular rate and rhythm, S1S2 heard, no murmurs appreciated Pulm: clear to auscultation bilaterally, no wheezes, rhonchi or rales; normal work of breathing on room air Extremities: warm, well perfused, 1+ pitting ankle edema, No cyanosis or clubbing; MSK: slow gait and hunched station      T spine: Marked thoracic kyphosis noted Skin: Ecchymosis noted along the left anterior shin and along the right cheek.  No palpable step-offs or bony deformities.  No skin breakdown  Assessment/ Plan: 85 y.o. female   Bilateral lower extremity edema - Plan: furosemide (LASIX) 20 MG tablet, Basic Metabolic Panel  Congestive heart failure, unspecified HF chronicity, unspecified heart failure type ([place]) - Plan: furosemide (LASIX) 20 MG tablet, Basic Metabolic Panel  Fall, subsequent encounter  I suspect that she has a component of CHF exacerbation.  I reviewed her last echocardiogram which did show preserved ejection fraction.  I have gone ahead and kept her at the twice daily dosing of Lasix.  She had a good 3 pound weight loss which I suspect is fluid since her last visit.  We may need to adjust her potassium but again she is on multiple medications which should preserve potassium including a 10 MEQ potassium supplement.  I will CC her cardiologist, who will see her next week with today's information.  I do worry that her dyspnea is related to her heart but most certainly could be secondary to the pronounced kyphotic curve in the  Thoracics.  She understands red flag signs and symptoms warranting further evaluation.  I will follow up with her tomorrow regarding her potassium  She demonstrates no neurologic abnormalities on today's exam.  She certainly has quite a bit of bruising around the face however and has not gotten any imaging since her fall.  I advised her to keep a close eye on symptoms and if she has any worrisome symptoms or signs she is to seek immediate rectal attention  No orders of the defined types were placed in this encounter.  No orders of the defined types were placed in this encounter.    Kelly Norlander, DO [place] [phone]

## 2020-06-09 NOTE — Progress Notes (Signed)
CARDIOLOGY CONSULT NOTE       Patient ID: [name] MRN: [id number] DOB/AGE: [birth year] 85 y.o.  Primary Physician: Janora Norlander, DO Primary Cardiologist: Johnsie Cancel   HPI:  85 y.o. referred by Dr Lajuana Ripple on 6/72/09 for diastolic CHF Admitted to [facility] 12/11/19 with progressive dyspnea 48 hours associated with orthopnea Associated with dry cough, headaches and fatigue No chest pain or edema BP very elevated on presentation systolic 470 mmHg she was Rx with iv nitro and lasix WBC 16.4 troponin negative x 2 BNP only 379 COVID/Influenza negative CXR showed diffuse infiltrates consistent with pneumonitis   She is diabetic No history of cardiac issues Echo done 12/11/19 reviewed EF 55-60% moderate LVH grade 2 diastolic CHF mild MR MRA with no RAS and K 3.6 Cr 0.78  Doing well. Compliant with meds BP excellent No dyspnea sleeping flat She lives alone but has 4 sisters and a boyfriend that rides her around She walks daily with no dyspnea, chest pian palpitations or syncope   Seen in ED 04/02/20 with right rib fracture after a fall  Seen in ED 05/05/20 for dyspnea and edema Rx lasix CXR normal  Seen in ED 1/23 with LLE cellulitis with ankle pain and BP elevated Rx with Bactrim BNP 833   Been off antibiotics for 10 days but LE;s still erythematous  Her LE edema is from venous disease not her heart  ROS All other systems reviewed and negative except as noted above  Past Medical History:  Diagnosis Date  . Arthritis    OA  . DDD (degenerative disc disease), lumbosacral   . Diabetes mellitus without complication ([place])   . Distal radius fracture    bilateral  . GERD (gastroesophageal reflux disease)   . Glaucoma   . HOH (hard of hearing)    left ear  . Hypertension   . PONV (postoperative nausea and vomiting)     Family History  Problem Relation Age of Onset  . Heart disease Mother   . Heart disease Father     Social History   Socioeconomic History  . Marital status: Widowed     Spouse name: Not on file  . Number of children: 0  . Years of education: 7  . Highest education level: 7th grade  Occupational History  . Not on file  Tobacco Use  . Smoking status: Never Smoker  . Smokeless tobacco: Never Used  Vaping Use  . Vaping Use: Never used  Substance and Sexual Activity  . Alcohol use: No  . Drug use: No  . Sexual activity: Not Currently  Other Topics Concern  . Not on file  Social History Narrative  . Not on file   Social Determinants of Health   Financial Resource Strain: Low Risk   . Difficulty of Paying Living Expenses: Not hard at all  Food Insecurity: No Food Insecurity  . Worried About Charity fundraiser in the Last Year: Never true  . Ran Out of Food in the Last Year: Never true  Transportation Needs: No Transportation Needs  . Lack of Transportation (Medical): No  . Lack of Transportation (Non-Medical): No  Physical Activity: Sufficiently Active  . Days of Exercise per Week: 5 days  . Minutes of Exercise per Session: 60 min  Stress: No Stress Concern Present  . Feeling of Stress : Not at all  Social Connections: Moderately Integrated  . Frequency of Communication with Friends and Family: More than three times a week  . Frequency  of Social Gatherings with Friends and Family: More than three times a week  . Attends Religious Services: More than 4 times per year  . Active Member of Clubs or Organizations: Yes  . Attends Archivist Meetings: More than 4 times per year  . Marital Status: Widowed  Intimate Partner Violence: Not At Risk  . Fear of Current or Ex-Partner: No  . Emotionally Abused: No  . Physically Abused: No  . Sexually Abused: No    Past Surgical History:  Procedure Laterality Date  . ABDOMINAL HYSTERECTOMY    . BREAST BIOPSY    . BREAST EXCISIONAL BIOPSY Left    benign  . BREAST SURGERY     left milk duct   . EYE SURGERY     cataract  . FRACTURE SURGERY Left    hip fracture  . OPEN REDUCTION  INTERNAL FIXATION (ORIF) DISTAL RADIAL FRACTURE Bilateral 05/24/2015   Procedure: OPEN REDUCTION INTERNAL FIXATION (ORIF) BILATERAL DISTAL RADIAL FRACTURE VOLAR PLATE;  Surgeon: Daryll Brod, MD;  Location: [facility];  Service: Orthopedics;  Laterality: Bilateral;      Current Outpatient Medications:  .  amLODipine (NORVASC) 10 MG tablet, Take 1 tablet (10 mg total) by mouth daily., Disp: 90 tablet, Rfl: 3 .  aspirin 81 MG tablet, Take 81 mg by mouth daily., Disp: , Rfl:  .  atorvastatin (LIPITOR) 20 MG tablet, Take 0.5 tablets (10 mg total) by mouth daily., Disp: 90 tablet, Rfl: 3 .  calcium-vitamin D (OSCAL WITH D) 500-200 MG-UNIT tablet, Take 1 tablet by mouth., Disp: , Rfl:  .  DEXILANT 60 MG capsule, Take 1 capsule by mouth daily. (Patient not taking: Reported on 03/05/2020), Disp: , Rfl:  .  fluticasone (FLONASE) 50 MCG/ACT nasal spray, Place into both nostrils daily., Disp: , Rfl:  .  furosemide (LASIX) 20 MG tablet, Take 1 tablet (20 mg total) by mouth 2 (two) times daily. x3 days. Then take 1 tablet daily., Disp: 60 tablet, Rfl: 0 .  glipiZIDE (GLUCOTROL) 10 MG tablet, TAKE 1 TABLET BY MOUTH ONCE DAILY BEFORE BREAKFAST, Disp: 90 tablet, Rfl: 3 .  glucose blood (ONETOUCH ULTRA) test strip, Test BS twice daily Dx E11.9, Disp: 200 each, Rfl: 3 .  hydrALAZINE (APRESOLINE) 25 MG tablet, Take 3 tablets (75 mg total) by mouth every 8 (eight) hours., Disp: 90 tablet, Rfl: 1 .  hydrochlorothiazide (HYDRODIURIL) 25 MG tablet, Take 1 tablet by mouth once daily, Disp: 30 tablet, Rfl: 5 .  ibandronate (BONIVA) 150 MG tablet, Take 150 mg by mouth every 30 (thirty) days. Take in the morning with a full glass of water, on an empty stomach, and do not take anything else by mouth or lie down for the next 30 min., Disp: , Rfl:  .  LANTUS SOLOSTAR 100 UNIT/ML Solostar Pen, INJECT 16 UNITS SUBCUTANEOUSLY AT BEDTIME, Disp: 15 mL, Rfl: 0 .  latanoprost (XALATAN) 0.005 % ophthalmic solution, 1 drop  at bedtime., Disp: , Rfl:  .  lisinopril (ZESTRIL) 40 MG tablet, Take 1 tablet (40 mg total) by mouth daily., Disp: 90 tablet, Rfl: 1 .  metoprolol tartrate (LOPRESSOR) 50 MG tablet, Take 1 tablet (50 mg total) by mouth 2 (two) times daily., Disp: 180 tablet, Rfl: 3 .  multivitamin-iron-minerals-folic acid (CENTRUM) chewable tablet, Chew 1 tablet by mouth daily., Disp: , Rfl:  .  potassium chloride (KLOR-CON) 10 MEQ tablet, Take 1 tablet (10 mEq total) by mouth daily., Disp: 30 tablet, Rfl: 1 .  sitaGLIPtin-metformin (JANUMET) 50-1000 MG tablet, TAKE 1 TABLET BY MOUTH TWICE DAILY WITH A MEAL, Disp: 180 tablet, Rfl: 3 .  timolol (TIMOPTIC) 0.5 % ophthalmic solution, INSTILL 1 DROP INTO EACH EYE ONCE DAILY, Disp: , Rfl:  .  tizanidine (ZANAFLEX) 2 MG capsule, Take 1 capsule (2 mg total) by mouth 3 (three) times daily as needed for muscle spasms., Disp: 30 capsule, Rfl: 0 .  traMADol (ULTRAM) 50 MG tablet, Take 0.5 tablets (25 mg total) by mouth every 6 (six) hours as needed for moderate pain., Disp: 12 tablet, Rfl: 0    Physical Exam: There were no vitals taken for this visit.    Affect appropriate Healthy:  appears stated age 66: normal Neck supple with no adenopathy JVP normal no bruits no thyromegaly Lungs clear with no wheezing and good diaphragmatic motion Heart:  S1/S2 no murmur, no rub, gallop or click PMI normal Abdomen: benighn, BS positve, no tenderness, no AAA no bruit.  No HSM or HJR Distal pulses intact with no bruits Neuro non-focal Varicosities with edema and mild erythema bilaterally  No muscular weakness   Labs:   Lab Results  Component Value Date   WBC 9.3 06/02/2020   HGB 11.1 06/02/2020   HCT 34.6 06/02/2020   MCV 82 06/02/2020   PLT 455 (H) 06/02/2020    No results for input(s): NA, K, CL, CO2, BUN, CREATININE, CALCIUM, PROT, BILITOT, ALKPHOS, ALT, AST, GLUCOSE in the last 168 hours.  Invalid input(s): LABALBU No results found for: CKTOTAL, CKMB,  CKMBINDEX, TROPONINI  Lab Results  Component Value Date   CHOL 88 (L) 05/31/2020   Lab Results  Component Value Date   HDL 46 05/31/2020   Lab Results  Component Value Date   LDLCALC 27 05/31/2020   Lab Results  Component Value Date   TRIG 67 05/31/2020   Lab Results  Component Value Date   CHOLHDL 1.9 05/31/2020   No results found for: LDLDIRECT    Radiology: DG Chest 2 View  Result Date: 06/02/2020 CLINICAL DATA:  Shortness of breath with leg swelling. EXAM: CHEST - 2 VIEW COMPARISON:  05/05/2020 FINDINGS: Marked kyphotic deformity of the lower thoracic spine. The lungs are clear without focal pneumonia, edema, pneumothorax or pleural effusion. Interstitial markings are diffusely coarsened with chronic features. Cardiopericardial silhouette is at upper limits of normal for size. Bones are diffusely demineralized. IMPRESSION: Stable.  No acute cardiopulmonary findings. Electronically Signed   By: Misty Stanley M.D.   On: 06/02/2020 11:07    EKG: SR rate 85 LAE diffuse ST depression LAD   ASSESSMENT AND PLAN:   1. HTN d/c with hydralazine, lopressor, norvasc , lisinopril and HCTZ  MRA negative for RAS  Improved  2. Diastolic CHF:  Related to age and hypertensive urgency as well as type 2 DM  Stable see below  3. DM:  Discussed low carb diet.  Target hemoglobin A1c is 6.5 or less.  Continue current medications. 4. HLD:  Continue statin  5. Abnormal ECG:  In setting of ? Diastolic CHF she is very active walking daily with no chest pain will defer stress testing at this time  6. Edema:  Unable to wear compression stockings. D/C HCTZ increase lasix 40 mg am / 20 mg PM F/U primary regarding cellulitis Check BMET/BNP today   F/U in 3 months   Signed: Jenkins Rouge 06/09/2020, 7:55 AM

## 2020-06-09 NOTE — Patient Instructions (Signed)
Keep Lasix at [facility] daily dosing. I'm checking your potassium today. Might need additional but I will call you. KEEP APPOINTMENT WITH CARDIOLOGY

## 2020-06-10 LAB — BASIC METABOLIC PANEL
BUN/Creatinine Ratio: 23 (ref 12–28)
BUN: 22 mg/dL (ref 8–27)
CO2: 26 mmol/L (ref 20–29)
Calcium: 9 mg/dL (ref 8.7–10.3)
Chloride: 94 mmol/L — ABNORMAL LOW (ref 96–106)
Creatinine, Ser: 0.97 mg/dL (ref 0.57–1.00)
GFR calc Af Amer: 63 mL/min/{1.73_m2} (ref >59)
GFR calc non Af Amer: 55 mL/min/{1.73_m2} — ABNORMAL LOW (ref >59)
Glucose: 234 mg/dL — ABNORMAL HIGH (ref 65–99)
Potassium: 3.6 mmol/L (ref 3.5–5.2)
Sodium: 140 mmol/L (ref 134–144)

## 2020-06-18 ENCOUNTER — Other Ambulatory Visit (HOSPITAL_COMMUNITY)
Admission: RE | Admit: 2020-06-18 | Discharge: 2020-06-18 | Disposition: A | Discharge status: Home / Self Care | Payer: Medicare Other | Source: Ambulatory Visit | Attending: Cardiovascular Disease | Admitting: Cardiovascular Disease

## 2020-06-18 ENCOUNTER — Encounter: Payer: Self-pay | Admitting: Cardiovascular Disease

## 2020-06-18 ENCOUNTER — Ambulatory Visit (INDEPENDENT_AMBULATORY_CARE_PROVIDER_SITE_OTHER): Payer: Medicare Other | Admitting: Cardiovascular Disease

## 2020-06-18 ENCOUNTER — Other Ambulatory Visit: Payer: Self-pay

## 2020-06-18 VITALS — BP 138/76 | HR 72 | Ht <= 58 in | Wt 130.2 lb

## 2020-06-18 DIAGNOSIS — I152 Hypertension secondary to endocrine disorders: Secondary | ICD-10-CM | POA: Insufficient documentation

## 2020-06-18 DIAGNOSIS — E1159 Type 2 diabetes mellitus with other circulatory complications: Secondary | ICD-10-CM | POA: Insufficient documentation

## 2020-06-18 DIAGNOSIS — I5043 Acute on chronic combined systolic (congestive) and diastolic (congestive) heart failure: Secondary | ICD-10-CM | POA: Diagnosis not present

## 2020-06-18 DIAGNOSIS — R609 Edema, unspecified: Secondary | ICD-10-CM | POA: Diagnosis not present

## 2020-06-18 DIAGNOSIS — I110 Hypertensive heart disease with heart failure: Secondary | ICD-10-CM | POA: Insufficient documentation

## 2020-06-18 LAB — BASIC METABOLIC PANEL
Anion gap: 10 (ref 5–15)
BUN: 26 mg/dL — ABNORMAL HIGH (ref 8–23)
CO2: 34 mmol/L — ABNORMAL HIGH (ref 22–32)
Calcium: 9.2 mg/dL (ref 8.9–10.3)
Chloride: 91 mmol/L — ABNORMAL LOW (ref 98–111)
Creatinine, Ser: 0.98 mg/dL (ref 0.44–1.00)
GFR, Estimated: 58 mL/min — ABNORMAL LOW (ref >60)
Glucose, Bld: 111 mg/dL — ABNORMAL HIGH (ref 70–99)
Potassium: 3.5 mmol/L (ref 3.5–5.1)
Sodium: 135 mmol/L (ref 135–145)

## 2020-06-18 LAB — BRAIN NATRIURETIC PEPTIDE: B Natriuretic Peptide: 346 pg/mL — ABNORMAL HIGH (ref 0.0–100.0)

## 2020-06-18 NOTE — Patient Instructions (Signed)
Medication Instructions:  Your physician has recommended you make the following change in your medication:   Stop Taking Hydrochlorothiazide   *If you need a refill on your cardiac medications before your next appointment, please call your pharmacy*   Lab Work: Your physician recommends that you return for lab work in: Today   If you have labs (blood work) drawn today and your tests are completely normal, you will receive your results only by: Marland Kitchen MyChart Message (if you have MyChart) OR . A paper copy in the mail If you have any lab test that is abnormal or we need to change your treatment, we will call you to review the results.   Testing/Procedures: NONE    Follow-Up: At [facility], you and your health needs are our priority.  As part of our continuing mission to provide you with exceptional heart care, we have created designated Provider Care Teams.  These Care Teams include your primary Cardiologist (physician) and Advanced Practice Providers (APPs -  Physician Assistants and Nurse Practitioners) who all work together to provide you with the care you need, when you need it.  We recommend signing up for the patient portal called "MyChart".  Sign up information is provided on this After Visit Summary.  MyChart is used to connect with patients for Virtual Visits (Telemedicine).  Patients are able to view lab/test results, encounter notes, upcoming appointments, etc.  Non-urgent messages can be sent to your provider as well.   To learn more about what you can do with MyChart, go to [email or web address].    Your next appointment:   3 month(s)  The format for your next appointment:   In Person  Provider:   Jenkins Rouge, MD, Bernerd Pho, PA-C or Ermalinda Barrios, PA-C   Other Instructions Thank you for choosing [facility]!

## 2020-06-21 ENCOUNTER — Other Ambulatory Visit: Payer: Self-pay

## 2020-06-21 ENCOUNTER — Telehealth: Payer: Self-pay

## 2020-06-21 DIAGNOSIS — I509 Heart failure, unspecified: Secondary | ICD-10-CM

## 2020-06-21 NOTE — Progress Notes (Signed)
Future lab orders Per Dr. Johnsie Cancel.

## 2020-06-21 NOTE — Telephone Encounter (Signed)
-----   Message from Josue Hector, MD sent at 06/18/2020 11:19 AM EST ----- BNP mildly elevated lasix was increased f/u labs in 6 weeks

## 2020-06-21 NOTE — Telephone Encounter (Signed)
Patient contacted and verbalized understanding.Orders placed for f/u lab work.

## 2020-06-28 ENCOUNTER — Other Ambulatory Visit: Payer: Self-pay | Admitting: Family Medicine

## 2020-06-28 ENCOUNTER — Ambulatory Visit (INDEPENDENT_AMBULATORY_CARE_PROVIDER_SITE_OTHER): Payer: Medicare Other | Admitting: Family Medicine

## 2020-06-28 ENCOUNTER — Encounter: Payer: Self-pay | Admitting: Family Medicine

## 2020-06-28 ENCOUNTER — Other Ambulatory Visit: Payer: Self-pay

## 2020-06-28 VITALS — BP 179/64 | HR 72 | Temp 97.6°F | Ht <= 58 in | Wt 130.0 lb

## 2020-06-28 DIAGNOSIS — I509 Heart failure, unspecified: Secondary | ICD-10-CM

## 2020-06-28 DIAGNOSIS — I5032 Chronic diastolic (congestive) heart failure: Secondary | ICD-10-CM | POA: Diagnosis not present

## 2020-06-28 DIAGNOSIS — I152 Hypertension secondary to endocrine disorders: Secondary | ICD-10-CM | POA: Diagnosis not present

## 2020-06-28 DIAGNOSIS — R600 Localized edema: Secondary | ICD-10-CM

## 2020-06-28 DIAGNOSIS — F5101 Primary insomnia: Secondary | ICD-10-CM | POA: Diagnosis not present

## 2020-06-28 DIAGNOSIS — I5042 Chronic combined systolic (congestive) and diastolic (congestive) heart failure: Secondary | ICD-10-CM | POA: Insufficient documentation

## 2020-06-28 DIAGNOSIS — E1159 Type 2 diabetes mellitus with other circulatory complications: Secondary | ICD-10-CM | POA: Diagnosis not present

## 2020-06-28 MED ORDER — TRAZODONE HCL 50 MG PO TABS: 25.0000 mg | ORAL_TABLET | Freq: Every evening | ORAL | 3 refills | Status: DC | PRN

## 2020-06-28 NOTE — Progress Notes (Signed)
Subjective: CC: HTN, CHF, ?cellulitis PCP: Janora Norlander, DO WER:XVQM C Stoneman is a 85 y.o. female presenting to clinic today for:  1. HTN, CHF Patient was seen by her cardiologist on 06/18/2020.  At that time her hydrochlorothiazide was discontinued and her Lasix was increased to 40 mg every morning and 20 mg every evening.  She was continued on hydralazine, Lopressor, Norvasc, lisinopril.  Her labs on that day showed stable kidney function, normal potassium level and downtrending BNP.  Patient weighed herself this morning and she was 131 pounds.  She does report good urine output with Lasix.  She has in fact discontinued hydrochlorothiazide as directed.  Her blood pressures continue to run elevated into the 086P to 619J systolic at home.  No reports of chest pain, shortness of breath but she has persistent lower extremity edema.  She is unable to tolerate compression hose.  She reports mild erythema and pain because of the edema.  No skin breakdown, fevers or drainage.  Last echocardiogram was August 2021 which showed grade 2 diastolic dysfunction but preserved EF of 55 to 60% and moderate left ventricular hypertrophy.  2. Cellulitis ? Concern for possible cellulitis of the lower extremities as there was associated erythema noted with edema.  ROS was negative as above  3.  Insomnia Patient reports difficulty with sleep.  She denies this is due to increased urinary frequency but in fact has difficulty falling asleep and staying asleep.  She only got about three 4 hours of sleep last night.  She is not on any sleep medications.  She is no longer on tramadol.  ROS: Per HPI  Allergies  Allergen Reactions  . Codeine Nausea Only   Past Medical History:  Diagnosis Date  . Arthritis    OA  . DDD (degenerative disc disease), lumbosacral   . Diabetes mellitus without complication ([place])   . Distal radius fracture    bilateral  . GERD (gastroesophageal reflux disease)   . Glaucoma    . HOH (hard of hearing)    left ear  . Hypertension   . PONV (postoperative nausea and vomiting)     Current Outpatient Medications:  .  amLODipine (NORVASC) 10 MG tablet, Take 1 tablet (10 mg total) by mouth daily., Disp: 90 tablet, Rfl: 3 .  aspirin 81 MG tablet, Take 81 mg by mouth daily., Disp: , Rfl:  .  atorvastatin (LIPITOR) 20 MG tablet, Take 0.5 tablets (10 mg total) by mouth daily., Disp: 90 tablet, Rfl: 3 .  calcium-vitamin D (OSCAL WITH D) 500-200 MG-UNIT tablet, Take 1 tablet by mouth., Disp: , Rfl:  .  DEXILANT 60 MG capsule, Take 1 capsule by mouth daily., Disp: , Rfl:  .  fluticasone (FLONASE) 50 MCG/ACT nasal spray, Place into both nostrils daily., Disp: , Rfl:  .  furosemide (LASIX) 20 MG tablet, Take 1 tablet (20 mg total) by mouth 2 (two) times daily., Disp: 60 tablet, Rfl: 0 .  glipiZIDE (GLUCOTROL) 10 MG tablet, TAKE 1 TABLET BY MOUTH ONCE DAILY BEFORE BREAKFAST, Disp: 90 tablet, Rfl: 3 .  glucose blood (ONETOUCH ULTRA) test strip, Test BS twice daily Dx E11.9, Disp: 200 each, Rfl: 3 .  hydrALAZINE (APRESOLINE) 25 MG tablet, Take 3 tablets (75 mg total) by mouth every 8 (eight) hours., Disp: 90 tablet, Rfl: 1 .  ibandronate (BONIVA) 150 MG tablet, Take 150 mg by mouth every 30 (thirty) days. Take in the morning with a full glass of water, on an empty  stomach, and do not take anything else by mouth or lie down for the next 30 min., Disp: , Rfl:  .  LANTUS SOLOSTAR 100 UNIT/ML Solostar Pen, INJECT 16 UNITS SUBCUTANEOUSLY AT BEDTIME, Disp: 15 mL, Rfl: 0 .  latanoprost (XALATAN) 0.005 % ophthalmic solution, 1 drop at bedtime., Disp: , Rfl:  .  lisinopril (ZESTRIL) 40 MG tablet, Take 1 tablet (40 mg total) by mouth daily., Disp: 90 tablet, Rfl: 1 .  metoprolol tartrate (LOPRESSOR) 50 MG tablet, Take 1 tablet (50 mg total) by mouth 2 (two) times daily., Disp: 180 tablet, Rfl: 3 .  multivitamin-iron-minerals-folic acid (CENTRUM) chewable tablet, Chew 1 tablet by mouth daily.,  Disp: , Rfl:  .  potassium chloride (KLOR-CON) 10 MEQ tablet, Take 1 tablet (10 mEq total) by mouth daily., Disp: 30 tablet, Rfl: 1 .  sitaGLIPtin-metformin (JANUMET) 50-1000 MG tablet, TAKE 1 TABLET BY MOUTH TWICE DAILY WITH A MEAL, Disp: 180 tablet, Rfl: 3 .  timolol (TIMOPTIC) 0.5 % ophthalmic solution, INSTILL 1 DROP INTO EACH EYE ONCE DAILY, Disp: , Rfl:  .  traMADol (ULTRAM) 50 MG tablet, Take 0.5 tablets (25 mg total) by mouth every 6 (six) hours as needed for moderate pain., Disp: 12 tablet, Rfl: 0 Social History   Socioeconomic History  . Marital status: Widowed    Spouse name: Not on file  . Number of children: 0  . Years of education: 7  . Highest education level: 7th grade  Occupational History  . Not on file  Tobacco Use  . Smoking status: Never Smoker  . Smokeless tobacco: Never Used  Vaping Use  . Vaping Use: Never used  Substance and Sexual Activity  . Alcohol use: No  . Drug use: No  . Sexual activity: Not Currently  Other Topics Concern  . Not on file  Social History Narrative  . Not on file   Social Determinants of Health   Financial Resource Strain: Low Risk   . Difficulty of Paying Living Expenses: Not hard at all  Food Insecurity: No Food Insecurity  . Worried About Charity fundraiser in the Last Year: Never true  . Ran Out of Food in the Last Year: Never true  Transportation Needs: No Transportation Needs  . Lack of Transportation (Medical): No  . Lack of Transportation (Non-Medical): No  Physical Activity: Sufficiently Active  . Days of Exercise per Week: 5 days  . Minutes of Exercise per Session: 60 min  Stress: No Stress Concern Present  . Feeling of Stress : Not at all  Social Connections: Moderately Integrated  . Frequency of Communication with Friends and Family: More than three times a week  . Frequency of Social Gatherings with Friends and Family: More than three times a week  . Attends Religious Services: More than 4 times per year  .  Active Member of Clubs or Organizations: Yes  . Attends Archivist Meetings: More than 4 times per year  . Marital Status: Widowed  Intimate Partner Violence: Not At Risk  . Fear of Current or Ex-Partner: No  . Emotionally Abused: No  . Physically Abused: No  . Sexually Abused: No   Family History  Problem Relation Age of Onset  . Heart disease Mother   . Heart disease Father     Objective: Office vital signs reviewed. BP (!) 179/64   Pulse 72   Temp 97.6 F (36.4 C) (Temporal)   Ht 4\' 10"  (1.473 m)   Wt 130 lb (59  kg)   SpO2 96%   BMI 27.17 kg/m   Physical Examination:  General: Awake, alert, nontoxic, No acute distress HEENT: Normal, sclera white, MMM Cardio: regular rate and rhythm, S1S2 heard, occasional extra beat heard. no murmurs appreciated Pulm: clear to auscultation bilaterally, no wheezes, rhonchi or rales; normal work of breathing on room air Extremities: 1-2+ pitting edema to mid shins bilaterally. Mild associated warmth. Stasis dermatitis noted along Left LE.  No drainage.  Assessment/ Plan: 85 y.o. female   Hypertension associated with diabetes ([place]) - Plan: Basic Metabolic Panel, Ambulatory referral to Advanced Hypertension Clinic - CVD [address]  Chronic diastolic heart failure ([place]) - Plan: Basic Metabolic Panel, Ambulatory referral to Advanced Hypertension Clinic - [facility] [place]  Bilateral lower extremity edema - Plan: Apply unna boot  Primary insomnia - Plan: traZODone (DESYREL) 50 MG tablet  Blood pressure is not at goal.  This is despite multiple medications and recent adjustments.  I am placing a referral to advanced hypertension clinic for further assistance with this patient  She continues to have lower extremity edema but does not have evidence of widespread fluid overload.  Lungs were clear today.  She does have associated erythema of the legs but I think this is more due to the edema rather than infection.  I placed  her in Unna boots and will have her reassessed on Wednesday.  If she has persistent erythema or any evidence of infection, we will plan to proceed with Keflex 250mg  4 times daily (renal dosing).  She will see Je on Wednesday, who is also visualized her lower extremities today and is aware of the plan.  Trial of trazodone for sleep, particular since she is not on tramadol.  She will follow-up as needed on this issue  No orders of the defined types were placed in this encounter.  No orders of the defined types were placed in this encounter.    Janora Norlander, DO [address] [phone]

## 2020-06-28 NOTE — Patient Instructions (Addendum)
I think the redness is from the swelling not due to infection  I've placed you in "unna boots" to help with swelling today.   Keep legs elevated.  Continue taking your Lasix as prescribed by Dr Johnsie Cancel  See Ardeen Fillers on Wednesday for recheck.  If legs are not looking better, probably will do antibiotics.  I referred you to the blood pressure clinic.  They will call for appointment  Trazodone for sleep.

## 2020-06-30 ENCOUNTER — Encounter: Payer: Self-pay | Admitting: Nurse Practitioner

## 2020-06-30 ENCOUNTER — Other Ambulatory Visit: Payer: Self-pay

## 2020-06-30 ENCOUNTER — Ambulatory Visit (INDEPENDENT_AMBULATORY_CARE_PROVIDER_SITE_OTHER): Payer: Medicare Other | Admitting: Nurse Practitioner

## 2020-06-30 VITALS — BP 197/83 | HR 79 | Temp 97.5°F | Ht <= 58 in | Wt 130.8 lb

## 2020-06-30 DIAGNOSIS — R600 Localized edema: Secondary | ICD-10-CM | POA: Insufficient documentation

## 2020-06-30 NOTE — Progress Notes (Signed)
Established Patient Office Visit  Subjective:  Patient ID: [name], female    DOB: April 26, 1936  Age: 85 y.o. MRN: [id number]  CC:  Chief Complaint  Patient presents with  . Follow-up    LOWER EXTREMITY EDEMA     HPI [name] presents for Edema: Patient complains of edema. The location of the edema is lower leg(s) bilateral.  The edema has been off and on.  Onset of symptoms was a few weeks ago,. The edema is present all day. The patient states the problem is intermittent for a few days.  The swelling has been aggravated by nothing, relieved by diuretics, and been associated with diagnosis of heart failure. Cardiac risk factors include diabetes mellitus.  Patient is reporting stable edema bilaterally.  With no swelling, pain or erythema.   Past Medical History:  Diagnosis Date  . Arthritis    OA  . DDD (degenerative disc disease), lumbosacral   . Diabetes mellitus without complication ([place])   . Distal radius fracture    bilateral  . GERD (gastroesophageal reflux disease)   . Glaucoma   . HOH (hard of hearing)    left ear  . Hypertension   . PONV (postoperative nausea and vomiting)     Past Surgical History:  Procedure Laterality Date  . ABDOMINAL HYSTERECTOMY    . BREAST BIOPSY    . BREAST EXCISIONAL BIOPSY Left    benign  . BREAST SURGERY     left milk duct   . EYE SURGERY     cataract  . FRACTURE SURGERY Left    hip fracture  . OPEN REDUCTION INTERNAL FIXATION (ORIF) DISTAL RADIAL FRACTURE Bilateral 05/24/2015   Procedure: OPEN REDUCTION INTERNAL FIXATION (ORIF) BILATERAL DISTAL RADIAL FRACTURE VOLAR PLATE;  Surgeon: Daryll Brod, MD;  Location: [facility];  Service: Orthopedics;  Laterality: Bilateral;    Family History  Problem Relation Age of Onset  . Heart disease Mother   . Heart disease Father     Social History   Socioeconomic History  . Marital status: Widowed    Spouse name: Not on file  . Number of children: 0  . Years  of education: 7  . Highest education level: 7th grade  Occupational History  . Not on file  Tobacco Use  . Smoking status: Never Smoker  . Smokeless tobacco: Never Used  Vaping Use  . Vaping Use: Never used  Substance and Sexual Activity  . Alcohol use: No  . Drug use: No  . Sexual activity: Not Currently  Other Topics Concern  . Not on file  Social History Narrative  . Not on file   Social Determinants of Health   Financial Resource Strain: Low Risk   . Difficulty of Paying Living Expenses: Not hard at all  Food Insecurity: No Food Insecurity  . Worried About Charity fundraiser in the Last Year: Never true  . Ran Out of Food in the Last Year: Never true  Transportation Needs: No Transportation Needs  . Lack of Transportation (Medical): No  . Lack of Transportation (Non-Medical): No  Physical Activity: Sufficiently Active  . Days of Exercise per Week: 5 days  . Minutes of Exercise per Session: 60 min  Stress: No Stress Concern Present  . Feeling of Stress : Not at all  Social Connections: Moderately Integrated  . Frequency of Communication with Friends and Family: More than three times a week  . Frequency of Social Gatherings with Friends and  Family: More than three times a week  . Attends Religious Services: More than 4 times per year  . Active Member of Clubs or Organizations: Yes  . Attends Archivist Meetings: More than 4 times per year  . Marital Status: Widowed  Intimate Partner Violence: Not At Risk  . Fear of Current or Ex-Partner: No  . Emotionally Abused: No  . Physically Abused: No  . Sexually Abused: No    Outpatient Medications Prior to Visit  Medication Sig Dispense Refill  . amLODipine (NORVASC) 10 MG tablet Take 1 tablet (10 mg total) by mouth daily. 90 tablet 3  . aspirin 81 MG tablet Take 81 mg by mouth daily.    Marland Kitchen atorvastatin (LIPITOR) 20 MG tablet Take 0.5 tablets (10 mg total) by mouth daily. 90 tablet 3  . calcium-vitamin D (OSCAL  WITH D) 500-200 MG-UNIT tablet Take 1 tablet by mouth.    . DEXILANT 60 MG capsule Take 1 capsule by mouth daily.    . fluticasone (FLONASE) 50 MCG/ACT nasal spray Place into both nostrils daily.    . furosemide (LASIX) 20 MG tablet TAKE 1 TABLET BY MOUTH TWICE DAILY FOR 3 DAYS THEN TAKE 1 TABLET ONCE DAILY 60 tablet 0  . glipiZIDE (GLUCOTROL) 10 MG tablet TAKE 1 TABLET BY MOUTH ONCE DAILY BEFORE BREAKFAST 90 tablet 3  . glucose blood (ONETOUCH ULTRA) test strip Test BS twice daily Dx E11.9 200 each 3  . hydrALAZINE (APRESOLINE) 25 MG tablet Take 3 tablets (75 mg total) by mouth every 8 (eight) hours. 90 tablet 1  . ibandronate (BONIVA) 150 MG tablet Take 150 mg by mouth every 30 (thirty) days. Take in the morning with a full glass of water, on an empty stomach, and do not take anything else by mouth or lie down for the next 30 min.    Marland Kitchen LANTUS SOLOSTAR 100 UNIT/ML Solostar Pen INJECT 16 UNITS SUBCUTANEOUSLY AT BEDTIME 15 mL 0  . latanoprost (XALATAN) 0.005 % ophthalmic solution 1 drop at bedtime.    Marland Kitchen lisinopril (ZESTRIL) 40 MG tablet Take 1 tablet (40 mg total) by mouth daily. 90 tablet 1  . metoprolol tartrate (LOPRESSOR) 50 MG tablet Take 1 tablet (50 mg total) by mouth 2 (two) times daily. 180 tablet 3  . multivitamin-iron-minerals-folic acid (CENTRUM) chewable tablet Chew 1 tablet by mouth daily.    . potassium chloride (KLOR-CON) 10 MEQ tablet Take 1 tablet (10 mEq total) by mouth daily. 30 tablet 1  . sitaGLIPtin-metformin (JANUMET) 50-1000 MG tablet TAKE 1 TABLET BY MOUTH TWICE DAILY WITH A MEAL 180 tablet 3  . timolol (TIMOPTIC) 0.5 % ophthalmic solution INSTILL 1 DROP INTO EACH EYE ONCE DAILY    . traZODone (DESYREL) 50 MG tablet Take 0.5-1 tablets (25-50 mg total) by mouth at bedtime as needed for sleep. 30 tablet 3   No facility-administered medications prior to visit.    Allergies  Allergen Reactions  . Codeine Nausea Only    ROS Review of Systems  HENT: Negative.    Respiratory: Negative.   Cardiovascular: Positive for leg swelling.  Gastrointestinal: Negative.   Genitourinary: Negative.   Musculoskeletal: Negative.   All other systems reviewed and are negative.     Objective:    Physical Exam Vitals reviewed.  Constitutional:      Appearance: Normal appearance.  HENT:     Head: Normocephalic.  Eyes:     Conjunctiva/sclera: Conjunctivae normal.  Cardiovascular:     Pulses: Normal pulses.  Heart sounds: Normal heart sounds.     Comments: Hx, CHF, bilateral lower extremity edema Pulmonary:     Effort: Pulmonary effort is normal.  Abdominal:     General: Bowel sounds are normal.  Musculoskeletal:     Right lower leg: Edema present.     Left lower leg: Edema present.  Skin:    General: Skin is warm.     Findings: No erythema.  Neurological:     Mental Status: She is alert and oriented to person, place, and time.     BP (!) 197/83   Pulse 79   Temp (!) 97.5 F (36.4 C)   Ht 4\' 10"  (1.473 m)   Wt 130 lb 12.8 oz (59.3 kg)   SpO2 95%   BMI 27.34 kg/m  Wt Readings from Last 3 Encounters:  06/30/20 130 lb 12.8 oz (59.3 kg)  06/28/20 130 lb (59 kg)  06/18/20 130 lb 3.2 oz (59.1 kg)     Health Maintenance Due  Topic Date Due  . OPHTHALMOLOGY EXAM  Never done  . COVID-19 Vaccine (3 - Booster for Moderna series) 01/29/2020    There are no preventive care reminders to display for this patient.  Lab Results  Component Value Date   TSH 2.670 05/31/2020   Lab Results  Component Value Date   WBC 9.3 06/02/2020   HGB 11.1 06/02/2020   HCT 34.6 06/02/2020   MCV 82 06/02/2020   PLT 455 (H) 06/02/2020   Lab Results  Component Value Date   NA 135 06/18/2020   K 3.5 06/18/2020   CO2 34 (H) 06/18/2020   GLUCOSE 111 (H) 06/18/2020   BUN 26 (H) 06/18/2020   CREATININE 0.98 06/18/2020   BILITOT 0.5 05/31/2020   ALKPHOS 95 05/31/2020   AST 17 05/31/2020   ALT 12 05/31/2020   PROT 7.0 05/31/2020   ALBUMIN 3.8  05/31/2020   CALCIUM 9.2 06/18/2020   ANIONGAP 10 06/18/2020   Lab Results  Component Value Date   CHOL 88 (L) 05/31/2020   Lab Results  Component Value Date   HDL 46 05/31/2020   Lab Results  Component Value Date   LDLCALC 27 05/31/2020   Lab Results  Component Value Date   TRIG 67 05/31/2020   Lab Results  Component Value Date   CHOLHDL 1.9 05/31/2020   Lab Results  Component Value Date   HGBA1C 6.5 05/31/2020      Assessment & Plan:   Problem List Items Addressed This Visit      Other   Bilateral lower extremity edema - Primary    Bilateral lower extremity edema is currently resolving with no pitting, erythema, swelling or pain  Continue to provide education to patient to elevate feet, administer medication as prescribed and follow-up with worsening of unresolved symptoms.            No orders of the defined types were placed in this encounter.   Follow-up: Return if symptoms worsen or fail to improve.    Ivy Lynn, NP

## 2020-06-30 NOTE — Assessment & Plan Note (Signed)
Bilateral lower extremity edema is currently resolving with no pitting, erythema, swelling or pain  Continue to provide education to patient to elevate feet, administer medication as prescribed and follow-up with worsening of unresolved symptoms.

## 2020-06-30 NOTE — Patient Instructions (Signed)
Edema  Edema is when you have too much fluid in your body or under your skin. Edema may make your legs, feet, and ankles swell up. Swelling is also common in looser tissues, like around your eyes. This is a common condition. It gets more common as you get older. There are many possible causes of edema. Eating too much salt (sodium) and being on your feet or sitting for a long time can cause edema in your legs, feet, and ankles. Hot weather may make edema worse. Edema is usually painless. Your skin may look swollen or shiny. Follow these instructions at home:  Keep the swollen body part raised (elevated) above the level of your heart when you are sitting or lying down.  Do not sit still or stand for a long time.  Do not wear tight clothes. Do not wear garters on your upper legs.  Exercise your legs. This can help the swelling go down.  Wear elastic bandages or support stockings as told by your doctor.  Eat a low-salt (low-sodium) diet to reduce fluid as told by your doctor.  Depending on the cause of your swelling, you may need to limit how much fluid you drink (fluid restriction).  Take over-the-counter and prescription medicines only as told by your doctor. Contact a doctor if:  Treatment is not working.  You have heart, liver, or kidney disease and have symptoms of edema.  You have sudden and unexplained weight gain. Get help right away if:  You have shortness of breath or chest pain.  You cannot breathe when you lie down.  You have pain, redness, or warmth in the swollen areas.  You have heart, liver, or kidney disease and get edema all of a sudden.  You have a fever and your symptoms get worse all of a sudden. Summary  Edema is when you have too much fluid in your body or under your skin.  Edema may make your legs, feet, and ankles swell up. Swelling is also common in looser tissues, like around your eyes.  Raise (elevate) the swollen body part above the level of your  heart when you are sitting or lying down.  Follow your doctor's instructions about diet and how much fluid you can drink (fluid restriction). This information is not intended to replace advice given to you by your health care provider. Make sure you discuss any questions you have with your health care provider. Document Revised: 02/18/2020 Document Reviewed: 02/18/2020 Elsevier Patient Education  2021 [employer]  

## 2020-07-08 ENCOUNTER — Telehealth: Payer: Self-pay

## 2020-07-08 NOTE — Telephone Encounter (Signed)
Pt called requesting that Dr Lajuana Ripple put in order for her to get a back brace.  Please advise and call patient to let her know if order can be placed.

## 2020-07-08 NOTE — Telephone Encounter (Signed)
I've not seen her for any back problems recently.  Put her in for a visit. Telephone ok if needed. We need to talk about why she feels she needs this

## 2020-07-09 NOTE — Telephone Encounter (Signed)
Appt made

## 2020-07-10 ENCOUNTER — Emergency Department (HOSPITAL_COMMUNITY): Payer: Medicare Other

## 2020-07-10 ENCOUNTER — Encounter (HOSPITAL_COMMUNITY): Payer: Self-pay | Admitting: *Deleted

## 2020-07-10 ENCOUNTER — Inpatient Hospital Stay (HOSPITAL_COMMUNITY)
Admission: EM | Admit: 2020-07-10 | Discharge: 2020-07-13 | DRG: 291 | Disposition: A | Discharge status: Home / Self Care | Payer: Medicare Other | Attending: Internal Medicine | Admitting: Internal Medicine

## 2020-07-10 ENCOUNTER — Other Ambulatory Visit: Payer: Self-pay

## 2020-07-10 DIAGNOSIS — Z8249 Family history of ischemic heart disease and other diseases of the circulatory system: Secondary | ICD-10-CM

## 2020-07-10 DIAGNOSIS — E876 Hypokalemia: Secondary | ICD-10-CM | POA: Diagnosis present

## 2020-07-10 DIAGNOSIS — G4700 Insomnia, unspecified: Secondary | ICD-10-CM | POA: Diagnosis not present

## 2020-07-10 DIAGNOSIS — I161 Hypertensive emergency: Secondary | ICD-10-CM | POA: Diagnosis not present

## 2020-07-10 DIAGNOSIS — E1169 Type 2 diabetes mellitus with other specified complication: Secondary | ICD-10-CM | POA: Diagnosis not present

## 2020-07-10 DIAGNOSIS — M5137 Other intervertebral disc degeneration, lumbosacral region: Secondary | ICD-10-CM | POA: Diagnosis not present

## 2020-07-10 DIAGNOSIS — H409 Unspecified glaucoma: Secondary | ICD-10-CM | POA: Diagnosis present

## 2020-07-10 DIAGNOSIS — I110 Hypertensive heart disease with heart failure: Principal | ICD-10-CM | POA: Diagnosis present

## 2020-07-10 DIAGNOSIS — I160 Hypertensive urgency: Secondary | ICD-10-CM | POA: Diagnosis not present

## 2020-07-10 DIAGNOSIS — E785 Hyperlipidemia, unspecified: Secondary | ICD-10-CM | POA: Diagnosis not present

## 2020-07-10 DIAGNOSIS — Z90710 Acquired absence of both cervix and uterus: Secondary | ICD-10-CM

## 2020-07-10 DIAGNOSIS — Z7984 Long term (current) use of oral hypoglycemic drugs: Secondary | ICD-10-CM | POA: Diagnosis not present

## 2020-07-10 DIAGNOSIS — I517 Cardiomegaly: Secondary | ICD-10-CM | POA: Diagnosis not present

## 2020-07-10 DIAGNOSIS — E119 Type 2 diabetes mellitus without complications: Secondary | ICD-10-CM | POA: Diagnosis present

## 2020-07-10 DIAGNOSIS — U071 COVID-19: Secondary | ICD-10-CM | POA: Diagnosis present

## 2020-07-10 DIAGNOSIS — Z885 Allergy status to narcotic agent status: Secondary | ICD-10-CM | POA: Diagnosis not present

## 2020-07-10 DIAGNOSIS — R600 Localized edema: Secondary | ICD-10-CM

## 2020-07-10 DIAGNOSIS — Z794 Long term (current) use of insulin: Secondary | ICD-10-CM | POA: Diagnosis not present

## 2020-07-10 DIAGNOSIS — I5033 Acute on chronic diastolic (congestive) heart failure: Secondary | ICD-10-CM | POA: Diagnosis not present

## 2020-07-10 DIAGNOSIS — J90 Pleural effusion, not elsewhere classified: Secondary | ICD-10-CM | POA: Diagnosis not present

## 2020-07-10 DIAGNOSIS — I5042 Chronic combined systolic (congestive) and diastolic (congestive) heart failure: Secondary | ICD-10-CM | POA: Diagnosis not present

## 2020-07-10 DIAGNOSIS — R0602 Shortness of breath: Secondary | ICD-10-CM

## 2020-07-10 DIAGNOSIS — K219 Gastro-esophageal reflux disease without esophagitis: Secondary | ICD-10-CM | POA: Diagnosis present

## 2020-07-10 DIAGNOSIS — H9190 Unspecified hearing loss, unspecified ear: Secondary | ICD-10-CM | POA: Diagnosis present

## 2020-07-10 DIAGNOSIS — Z79899 Other long term (current) drug therapy: Secondary | ICD-10-CM | POA: Diagnosis not present

## 2020-07-10 DIAGNOSIS — I509 Heart failure, unspecified: Secondary | ICD-10-CM

## 2020-07-10 DIAGNOSIS — R7989 Other specified abnormal findings of blood chemistry: Secondary | ICD-10-CM | POA: Diagnosis not present

## 2020-07-10 DIAGNOSIS — J811 Chronic pulmonary edema: Secondary | ICD-10-CM | POA: Diagnosis not present

## 2020-07-10 LAB — COMPREHENSIVE METABOLIC PANEL
ALT: 30 U/L (ref 0–44)
AST: 33 U/L (ref 15–41)
Albumin: 3.5 g/dL (ref 3.5–5.0)
Alkaline Phosphatase: 90 U/L (ref 38–126)
Anion gap: 14 (ref 5–15)
BUN: 21 mg/dL (ref 8–23)
CO2: 26 mmol/L (ref 22–32)
Calcium: 8.4 mg/dL — ABNORMAL LOW (ref 8.9–10.3)
Chloride: 97 mmol/L — ABNORMAL LOW (ref 98–111)
Creatinine, Ser: 0.79 mg/dL (ref 0.44–1.00)
GFR, Estimated: >60 mL/min (ref >60)
Glucose, Bld: 154 mg/dL — ABNORMAL HIGH (ref 70–99)
Potassium: 3.3 mmol/L — ABNORMAL LOW (ref 3.5–5.1)
Sodium: 137 mmol/L (ref 135–145)
Total Bilirubin: 0.9 mg/dL (ref 0.3–1.2)
Total Protein: 7.3 g/dL (ref 6.5–8.1)

## 2020-07-10 LAB — CBC WITH DIFFERENTIAL/PLATELET
Abs Immature Granulocytes: 0.05 10*3/uL (ref 0.00–0.07)
Basophils Absolute: 0.1 10*3/uL (ref 0.0–0.1)
Basophils Relative: 1 %
Eosinophils Absolute: 0.4 10*3/uL (ref 0.0–0.5)
Eosinophils Relative: 3 %
HCT: 35.6 % — ABNORMAL LOW (ref 36.0–46.0)
Hemoglobin: 10.7 g/dL — ABNORMAL LOW (ref 12.0–15.0)
Immature Granulocytes: 0 %
Lymphocytes Relative: 9 %
Lymphs Abs: 1 10*3/uL (ref 0.7–4.0)
MCH: 26.4 pg (ref 26.0–34.0)
MCHC: 30.1 g/dL (ref 30.0–36.0)
MCV: 87.7 fL (ref 80.0–100.0)
Monocytes Absolute: 1.2 10*3/uL — ABNORMAL HIGH (ref 0.1–1.0)
Monocytes Relative: 11 %
Neutro Abs: 8.4 10*3/uL — ABNORMAL HIGH (ref 1.7–7.7)
Neutrophils Relative %: 76 %
Platelets: 376 10*3/uL (ref 150–400)
RBC: 4.06 MIL/uL (ref 3.87–5.11)
RDW: 15.1 % (ref 11.5–15.5)
WBC: 11.1 10*3/uL — ABNORMAL HIGH (ref 4.0–10.5)
nRBC: 0 % (ref 0.0–0.2)

## 2020-07-10 LAB — BRAIN NATRIURETIC PEPTIDE: B Natriuretic Peptide: 886 pg/mL — ABNORMAL HIGH (ref 0.0–100.0)

## 2020-07-10 LAB — D-DIMER, QUANTITATIVE: D-Dimer, Quant: 1.5 ug/mL-FEU — ABNORMAL HIGH (ref 0.00–0.50)

## 2020-07-10 MED ORDER — FUROSEMIDE 10 MG/ML IJ SOLN
80.0000 mg | Freq: Once | INTRAMUSCULAR | Status: AC
  Administered 2020-07-10: 80 mg via INTRAVENOUS
  Filled 2020-07-10: qty 8

## 2020-07-10 MED ORDER — HYDRALAZINE HCL 25 MG PO TABS
75.0000 mg | ORAL_TABLET | Freq: Once | ORAL | Status: AC
  Administered 2020-07-10: 75 mg via ORAL
  Filled 2020-07-10: qty 3

## 2020-07-10 MED ORDER — NITROGLYCERIN IN D5W 200-5 MCG/ML-% IV SOLN
5.0000 ug/min | INTRAVENOUS | Status: DC
  Administered 2020-07-10: 5 ug/min via INTRAVENOUS
  Filled 2020-07-10: qty 250

## 2020-07-10 MED ORDER — IOHEXOL 350 MG/ML SOLN
100.0000 mL | Freq: Once | INTRAVENOUS | Status: AC | PRN
  Administered 2020-07-10: 100 mL via INTRAVENOUS

## 2020-07-10 MED ORDER — METOPROLOL TARTRATE 50 MG PO TABS
50.0000 mg | ORAL_TABLET | Freq: Once | ORAL | Status: AC
  Administered 2020-07-10: 50 mg via ORAL
  Filled 2020-07-10: qty 1

## 2020-07-10 NOTE — ED Triage Notes (Signed)
C/o shortness of breath on exertion. States she has a history of CHF.

## 2020-07-10 NOTE — ED Provider Notes (Signed)
[facility] EMERGENCY DEPARTMENT Provider Note   CSN: [id number] Arrival date & time: 07/10/20  1804     History Chief Complaint  Patient presents with  . Shortness of Breath    [name] is a 85 y.o. female.  Patient with a history of diastolic heart failure and hypertension.  Patient states that for the past week she has been having exertional shortness of breath cannot walk more than 5 yards.  Patient's blood pressures also have been poorly controlled.  Patient may not be taking her blood pressure medicine exactly the way her physicians want her to take it.  She is followed by cardiology here.  On presentation blood pressure was 213/83.  Patient has had 2 over Covid vaccines.  Has not had the booster.  Patient denies any chest pain.  Patient is oxygen saturations on room air are low 90s.  Patient also states she has had increased leg swelling.        Past Medical History:  Diagnosis Date  . Arthritis    OA  . DDD (degenerative disc disease), lumbosacral   . Diabetes mellitus without complication ([place])   . Distal radius fracture    bilateral  . GERD (gastroesophageal reflux disease)   . Glaucoma   . HOH (hard of hearing)    left ear  . Hypertension   . PONV (postoperative nausea and vomiting)     Patient Active Problem List   Diagnosis Date Noted  . CHF (congestive heart failure) ([place]) 07/10/2020  . Bilateral lower extremity edema 06/30/2020  . Chronic combined systolic and diastolic heart failure ([place]) 06/28/2020  . Hypertensive emergency 12/11/2019  . Abnormal EKG 12/11/2019  . Hypomagnesemia 12/11/2019  . Abnormal chest x-ray 12/11/2019  . Hypertensive urgency 12/11/2019  . Glaucoma   . GERD (gastroesophageal reflux disease)   . Hypocalcemia   . Type 2 diabetes mellitus ([place])   . Congestive heart failure ([place])   . SOB (shortness of breath)   . Uncontrolled type 2 diabetes mellitus with hyperglycemia ([place]) 09/01/2019  . Hypertension associated with  diabetes ([place]) 09/01/2019  . Hyperlipidemia associated with type 2 diabetes mellitus ([place]) 09/01/2019  . Microalbuminuria due to type 2 diabetes mellitus ([place]) 06/18/2019    Past Surgical History:  Procedure Laterality Date  . ABDOMINAL HYSTERECTOMY    . BREAST BIOPSY    . BREAST EXCISIONAL BIOPSY Left    benign  . BREAST SURGERY     left milk duct   . EYE SURGERY     cataract  . FRACTURE SURGERY Left    hip fracture  . OPEN REDUCTION INTERNAL FIXATION (ORIF) DISTAL RADIAL FRACTURE Bilateral 05/24/2015   Procedure: OPEN REDUCTION INTERNAL FIXATION (ORIF) BILATERAL DISTAL RADIAL FRACTURE VOLAR PLATE;  Surgeon: Daryll Brod, MD;  Location: [place];  Service: Orthopedics;  Laterality: Bilateral;     OB History   No obstetric history on file.     Family History  Problem Relation Age of Onset  . Heart disease Mother   . Heart disease Father     Social History   Tobacco Use  . Smoking status: Never Smoker  . Smokeless tobacco: Never Used  Vaping Use  . Vaping Use: Never used  Substance Use Topics  . Alcohol use: No  . Drug use: No    Home Medications Prior to Admission medications   Medication Sig Start Date End Date Taking? Authorizing Provider  amLODipine (NORVASC) 10 MG tablet Take 1 tablet (10 mg  total) by mouth daily. 09/01/19  Yes [name] M, DO  aspirin 81 MG tablet Take 81 mg by mouth daily.   Yes [provider]  calcium-vitamin D (OSCAL WITH D) 500-200 MG-UNIT tablet Take 1 tablet by mouth.   Yes [provider]  DEXILANT 60 MG capsule Take 1 capsule by mouth daily. 02/13/20  Yes [provider]  fluticasone (FLONASE) 50 MCG/ACT nasal spray Place into both nostrils daily.   Yes [provider]  furosemide (LASIX) 20 MG tablet TAKE 1 TABLET BY MOUTH TWICE DAILY FOR 3 DAYS THEN TAKE 1 TABLET ONCE DAILY Patient taking differently: Take 20 mg by mouth 2 (two) times daily. 06/28/20  Yes Gottschalk, Ashly M,  DO  glipiZIDE (GLUCOTROL) 10 MG tablet TAKE 1 TABLET BY MOUTH ONCE DAILY BEFORE BREAKFAST 06/02/20  Yes Gottschalk, Ashly M, DO  hydrALAZINE (APRESOLINE) 25 MG tablet Take 3 tablets (75 mg total) by mouth every 8 (eight) hours. Patient taking differently: Take 75 mg by mouth daily. 12/12/19  Yes Johnson, Clanford L, MD  LANTUS SOLOSTAR 100 UNIT/ML Solostar Pen INJECT 16 UNITS SUBCUTANEOUSLY AT BEDTIME 05/06/20  Yes Gottschalk, Ashly M, DO  latanoprost (XALATAN) 0.005 % ophthalmic solution 1 drop at bedtime.   Yes [provider]  lisinopril (ZESTRIL) 40 MG tablet Take 1 tablet (40 mg total) by mouth daily. 04/19/20  Yes [name] M, DO  metoprolol tartrate (LOPRESSOR) 50 MG tablet Take 1 tablet (50 mg total) by mouth 2 (two) times daily. 06/02/20  Yes [name] M, DO  multivitamin-iron-minerals-folic acid (CENTRUM) chewable tablet Chew 1 tablet by mouth daily.   Yes [provider]  potassium chloride (KLOR-CON) 10 MEQ tablet Take 1 tablet (10 mEq total) by mouth daily. 12/13/19  Yes Johnson, Clanford L, MD  sitaGLIPtin-metformin (JANUMET) 50-1000 MG tablet TAKE 1 TABLET BY MOUTH TWICE DAILY WITH A MEAL 06/02/20  Yes Gottschalk, Ashly M, DO  timolol (TIMOPTIC) 0.5 % ophthalmic solution Place 1 drop into both eyes daily. 01/27/19  Yes [provider]  traZODone (DESYREL) 50 MG tablet Take 0.5-1 tablets (25-50 mg total) by mouth at bedtime as needed for sleep. 06/28/20  Yes [name] M, DO  atorvastatin (LIPITOR) 20 MG tablet Take 0.5 tablets (10 mg total) by mouth daily. Patient not taking: No sig reported 06/02/20   [name] M, DO  glucose blood (ONETOUCH ULTRA) test strip Test BS twice daily Dx E11.9 04/06/20   [name] M, DO  ibandronate (BONIVA) 150 MG tablet Take 150 mg by mouth every 30 (thirty) days. Take in the morning with a full glass of water, on an empty stomach, and do not take anything else by mouth or lie down for the next 30  min. Patient not taking: Reported on 07/10/2020    [provider]    Allergies    Codeine  Review of Systems   Review of Systems  Constitutional: Negative for chills and fever.  HENT: Negative for congestion, rhinorrhea and sore throat.   Eyes: Negative for visual disturbance.  Respiratory: Positive for shortness of breath. Negative for cough.   Cardiovascular: Positive for leg swelling. Negative for chest pain.  Gastrointestinal: Negative for abdominal pain, diarrhea, nausea and vomiting.  Genitourinary: Negative for dysuria.  Musculoskeletal: Negative for back pain and neck pain.  Skin: Negative for rash.  Neurological: Negative for dizziness, light-headedness and headaches.  Hematological: Does not bruise/bleed easily.  Psychiatric/Behavioral: Negative for confusion.    Physical Exam Updated Vital Signs  BP (!) 156/58   Pulse 74   Temp 98.9 F (37.2 C) (Oral)   Resp (!) 26   Wt 59.4 kg   SpO2 90%   BMI 27.38 kg/m   Physical Exam Vitals and nursing note reviewed.  Constitutional:      General: She is not in acute distress.    Appearance: Normal appearance. She is well-developed and well-nourished.  HENT:     Head: Normocephalic and atraumatic.  Eyes:     Extraocular Movements: Extraocular movements intact.     Conjunctiva/sclera: Conjunctivae normal.     Pupils: Pupils are equal, round, and reactive to light.  Cardiovascular:     Rate and Rhythm: Normal rate and regular rhythm.     Heart sounds: No murmur heard.   Pulmonary:     Effort: Pulmonary effort is normal. No respiratory distress.     Breath sounds: Rales present.  Abdominal:     Palpations: Abdomen is soft.     Tenderness: There is no abdominal tenderness.  Musculoskeletal:        General: Swelling present. Normal range of motion.     Cervical back: Normal range of motion and neck supple.     Right lower leg: Edema present.     Left lower leg: Edema present.  Skin:    General: Skin is  warm and dry.     Capillary Refill: Capillary refill takes less than 2 seconds.  Neurological:     General: No focal deficit present.     Mental Status: She is alert and oriented to person, place, and time.     Cranial Nerves: No cranial nerve deficit.     Sensory: No sensory deficit.     Motor: No weakness.  Psychiatric:        Mood and Affect: Mood and affect normal.     ED Results / Procedures / Treatments   Labs (all labs ordered are listed, but only abnormal results are displayed) Labs Reviewed  COMPREHENSIVE METABOLIC PANEL - Abnormal; Notable for the following components:      Result Value   Potassium 3.3 (*)    Chloride 97 (*)    Glucose, Bld 154 (*)    Calcium 8.4 (*)    All other components within normal limits  CBC WITH DIFFERENTIAL/PLATELET - Abnormal; Notable for the following components:   WBC 11.1 (*)    Hemoglobin 10.7 (*)    HCT 35.6 (*)    Neutro Abs 8.4 (*)    Monocytes Absolute 1.2 (*)    All other components within normal limits  BRAIN NATRIURETIC PEPTIDE - Abnormal; Notable for the following components:   B Natriuretic Peptide 886.0 (*)    All other components within normal limits  D-DIMER, QUANTITATIVE - Abnormal; Notable for the following components:   D-Dimer, Quant 1.50 (*)    All other components within normal limits  RESP PANEL BY RT-PCR (FLU A&B, COVID) [id number]    EKG EKG Interpretation  Date/Time:  Saturday July 10 2020 19:01:44 EST Ventricular Rate:  93 PR Interval:    QRS Duration: 110 QT Interval:  389 QTC Calculation: 484 R Axis:   31 Text Interpretation: Unknown rhythm, irregular rate Short PR interval Low voltage, precordial leads Nonspecific repol abnormality, diffuse leads Confirmed by Fredia Sorrow [id number]) on 07/10/2020 7:06:23 PM   Radiology CT Angio Chest PE W/Cm &/Or Wo Cm  Result Date: 07/10/2020 CLINICAL DATA:  PE suspected, low/intermediate prob, positive D-dimer Shortness of breath on exertion. EXAM:  CT ANGIOGRAPHY  CHEST WITH CONTRAST TECHNIQUE: Multidetector CT imaging of the chest was performed using the standard protocol during bolus administration of intravenous contrast. Multiplanar CT image reconstructions and MIPs were obtained to evaluate the vascular anatomy. CONTRAST:  187mL OMNIPAQUE IOHEXOL 350 MG/ML SOLN COMPARISON:  Radiograph earlier today. FINDINGS: Distortion of normal anatomy due to severe kyphosis. Cardiovascular: Motion artifact limits assessment, particularly of the upper lobes. Allowing for this, no evidence of pulmonary embolus. Mild cardiomegaly. There is right heart dilatation and reflux of contrast into the hepatic veins and IVC. Dense mitral annulus calcifications. There are coronary artery calcifications. No pericardial effusion. Atherosclerotic tortuous thoracic aorta without dissection or acute aortic finding. Mediastinum/Nodes: Scattered small mediastinal lymph nodes are not enlarged by size criteria. No hilar adenopathy. Left thyroid calcifications without dominant nodule. No esophageal wall thickening. Lungs/Pleura: Breathing motion limits assessment, particularly of the upper lungs. Bibasilar volume loss. Probable apical septal thickening with areas of ground-glass opacity and bronchial thickening. Favor pulmonary edema. No confluent consolidation. Trace right pleural effusion. Upper Abdomen: Contrast refluxes into the hepatic veins and IVC. No other acute upper abdominal findings. Musculoskeletal: Exaggerated thoracic kyphosis. Severe T7 compression fracture with vertebral plana appearance. Mild T3 superior endplate compression fracture. Moderate T12 compression fracture. L1 compression fracture only partially included in the field of view. There is a well-circumscribed 2.1 x 1.7 cm hypodense lesion the left T1-T2 costovertebral junction, suspicious for nerve sheath tumor. No surrounding inflammation. A smaller but similar lesion is seen on the right T1-T2. Review of the MIP images confirms  the above findings. IMPRESSION: 1. No pulmonary embolus allowing for motion artifact. 2. Cardiomegaly with reflux of contrast into the hepatic veins and IVC consistent with elevated right heart pressures. Findings suspicious for pulmonary edema, though detailed assessment is limited by motion. Trace right pleural effusion. 3. Bibasilar volume loss. 4. Exaggerated thoracic kyphosis with multiple compression fractures, severe at T7. 5. Well-circumscribed 2.1 x 1.7 cm hypodense lesion at the left T1-T2 costovertebral junction, suspicious for nerve sheath tumor such as schwannoma. Possibility of tortuous vascular structure is also considered but felt less likely. A smaller but similar lesion is seen on the right at same level. Aortic Atherosclerosis (ICD10-I70.0). Electronically Signed   By: Keith Rake M.D.   On: 07/10/2020 21:53   DG Chest Port 1 View  Result Date: 07/10/2020 CLINICAL DATA:  Shortness of breath on exertion. EXAM: PORTABLE CHEST 1 VIEW COMPARISON:  06/02/2020 FINDINGS: Patient is rotated. Similar low lung volumes to prior. Heart size difficult to assess given positioning lung grossly stable. Slight increase in interstitial opacities from prior which may represent pulmonary edema. No confluent airspace disease or pleural effusion. Bones are subjectively under mineralized IMPRESSION: 1. Slight increase in interstitial opacities from prior which may represent pulmonary edema. 2. Low lung volumes limit assessment. Electronically Signed   By: Keith Rake M.D.   On: 07/10/2020 20:10    Procedures Procedures   CRITICAL CARE Performed by: Fredia Sorrow Total critical care time: 60 minutes Critical care time was exclusive of separately billable procedures and treating other patients. Critical care was necessary to treat or prevent imminent or life-threatening deterioration. Critical care was time spent personally by me on the following activities: development of treatment plan with  patient and/or surrogate as well as nursing, discussions with consultants, evaluation of patient's response to treatment, examination of patient, obtaining history from patient or surrogate, ordering and performing treatments and interventions, ordering and review of laboratory studies, ordering and review of  radiographic studies, pulse oximetry and re-evaluation of patient's condition.   Medications Ordered in ED Medications  nitroGLYCERIN 50 mg in dextrose 5 % 250 mL (0.2 mg/mL) infusion (has no administration in time range)  metoprolol tartrate (LOPRESSOR) tablet 50 mg (50 mg Oral Given 07/10/20 1932)  hydrALAZINE (APRESOLINE) tablet 75 mg (75 mg Oral Given 07/10/20 1932)  furosemide (LASIX) injection 80 mg (80 mg Intravenous Given 07/10/20 2057)  iohexol (OMNIPAQUE) 350 MG/ML injection 100 mL (100 mLs Intravenous Contrast Given 07/10/20 2124)    ED Course  I have reviewed the triage vital signs and the nursing notes.  Pertinent labs & imaging results that were available during my care of the patient were reviewed by me and considered in my medical decision making (see chart for details).    MDM Rules/Calculators/A&P                          Patient's initial chest x-ray concerning for pulmonary edema.  Patient D-dimer was elevated.  So she had CT angio chest which showed no pulmonary embolus.  That also was concerning for pulmonary edema.  Her BNP was markedly elevated in the 800 range from her baseline.  Potassium a little low at 3.3.  Patient did not have any chest pain.  EKG without acute changes.  Patient seems to be not taking her blood pressure medicine as she is supposed to.  She had 1 dose of her Lopressor in the morning.  I gave her second dose of Lopressor 50 mg.  And her hydralazine she is only taking 75 mg daily it sounds as if she is post take 75 mg every 8 hours.  So I gave her another dose of 75 mg of hydralazine.  No significant change in blood pressure would get 1 down to the  diastolics of 944.  But then she went back up into the 180s.  Patient received 80 mg of Lasix diuresing well.  That made her feel little bit better.  Still oxygen saturations on room air are low 90s.  That the high blood pressure is contributing to her congestive heart failure.  Patient will be started on nitroglycerin drip.  Discussed with hospitalist they will admit.  Covid testing is pending.     Final Clinical Impression(s) / ED Diagnoses Final diagnoses:  SOB (shortness of breath)  Acute on chronic diastolic congestive heart failure ([place])  Hypertensive urgency    Rx / DC Orders ED Discharge Orders    None       Fredia Sorrow, MD 07/10/20 2319

## 2020-07-11 ENCOUNTER — Inpatient Hospital Stay (HOSPITAL_COMMUNITY): Payer: Medicare Other

## 2020-07-11 DIAGNOSIS — I160 Hypertensive urgency: Secondary | ICD-10-CM

## 2020-07-11 DIAGNOSIS — I5042 Chronic combined systolic (congestive) and diastolic (congestive) heart failure: Secondary | ICD-10-CM | POA: Diagnosis not present

## 2020-07-11 DIAGNOSIS — I5033 Acute on chronic diastolic (congestive) heart failure: Secondary | ICD-10-CM

## 2020-07-11 DIAGNOSIS — R0602 Shortness of breath: Secondary | ICD-10-CM

## 2020-07-11 LAB — PROCALCITONIN: Procalcitonin: <0.1 ng/mL

## 2020-07-11 LAB — COMPREHENSIVE METABOLIC PANEL
ALT: 36 U/L (ref 0–44)
AST: 35 U/L (ref 15–41)
Albumin: 3.1 g/dL — ABNORMAL LOW (ref 3.5–5.0)
Alkaline Phosphatase: 82 U/L (ref 38–126)
Anion gap: 15 (ref 5–15)
BUN: 19 mg/dL (ref 8–23)
CO2: 27 mmol/L (ref 22–32)
Calcium: 8 mg/dL — ABNORMAL LOW (ref 8.9–10.3)
Chloride: 95 mmol/L — ABNORMAL LOW (ref 98–111)
Creatinine, Ser: 0.73 mg/dL (ref 0.44–1.00)
GFR, Estimated: >60 mL/min (ref >60)
Glucose, Bld: 156 mg/dL — ABNORMAL HIGH (ref 70–99)
Potassium: 3.8 mmol/L (ref 3.5–5.1)
Sodium: 137 mmol/L (ref 135–145)
Total Bilirubin: 0.9 mg/dL (ref 0.3–1.2)
Total Protein: 6.7 g/dL (ref 6.5–8.1)

## 2020-07-11 LAB — ECHOCARDIOGRAM COMPLETE
AR max vel: 1.97 cm2
AV Area VTI: 1.75 cm2
AV Area mean vel: 1.84 cm2
AV Mean grad: 3 mmHg
AV Peak grad: 5.9 mmHg
Ao pk vel: 1.21 m/s
Area-P 1/2: 4.68 cm2
Height: 61 in
MV VTI: 1.34 cm2
S' Lateral: 1.84 cm
Weight: 2045.87 oz

## 2020-07-11 LAB — LACTATE DEHYDROGENASE: LDH: 207 U/L — ABNORMAL HIGH (ref 98–192)

## 2020-07-11 LAB — CBC
HCT: 32.6 % — ABNORMAL LOW (ref 36.0–46.0)
Hemoglobin: 10.2 g/dL — ABNORMAL LOW (ref 12.0–15.0)
MCH: 26.8 pg (ref 26.0–34.0)
MCHC: 31.3 g/dL (ref 30.0–36.0)
MCV: 85.8 fL (ref 80.0–100.0)
Platelets: 385 10*3/uL (ref 150–400)
RBC: 3.8 MIL/uL — ABNORMAL LOW (ref 3.87–5.11)
RDW: 15.2 % (ref 11.5–15.5)
WBC: 10.4 10*3/uL (ref 4.0–10.5)
nRBC: 0 % (ref 0.0–0.2)

## 2020-07-11 LAB — GLUCOSE, CAPILLARY
Glucose-Capillary: 159 mg/dL — ABNORMAL HIGH (ref 70–99)
Glucose-Capillary: 203 mg/dL — ABNORMAL HIGH (ref 70–99)
Glucose-Capillary: 240 mg/dL — ABNORMAL HIGH (ref 70–99)
Glucose-Capillary: 229 mg/dL — ABNORMAL HIGH (ref 70–99)
Glucose-Capillary: 251 mg/dL — ABNORMAL HIGH (ref 70–99)

## 2020-07-11 LAB — FERRITIN: Ferritin: 26 ng/mL (ref 11–307)

## 2020-07-11 LAB — TSH: TSH: 2.869 u[IU]/mL (ref 0.350–4.500)

## 2020-07-11 LAB — RESP PANEL BY RT-PCR (FLU A&B, COVID) ARPGX2
Influenza A by PCR: NEGATIVE
Influenza B by PCR: NEGATIVE
SARS Coronavirus 2 by RT PCR: POSITIVE — AB

## 2020-07-11 LAB — MRSA PCR SCREENING: MRSA by PCR: NEGATIVE

## 2020-07-11 LAB — MAGNESIUM: Magnesium: 1.1 mg/dL — ABNORMAL LOW (ref 1.7–2.4)

## 2020-07-11 LAB — D-DIMER, QUANTITATIVE: D-Dimer, Quant: 1.9 ug/mL-FEU — ABNORMAL HIGH (ref 0.00–0.50)

## 2020-07-11 LAB — C-REACTIVE PROTEIN: CRP: 4.7 mg/dL — ABNORMAL HIGH (ref <1.0)

## 2020-07-11 MED ORDER — CHLORHEXIDINE GLUCONATE CLOTH 2 % EX PADS
6.0000 | MEDICATED_PAD | Freq: Every day | CUTANEOUS | Status: DC
  Administered 2020-07-11: 6 via TOPICAL

## 2020-07-11 MED ORDER — ONDANSETRON HCL 4 MG/2ML IJ SOLN: 4.0000 mg | Freq: Four times a day (QID) | INTRAMUSCULAR | Status: DC | PRN

## 2020-07-11 MED ORDER — ASPIRIN EC 81 MG PO TBEC
81.0000 mg | DELAYED_RELEASE_TABLET | Freq: Every day | ORAL | Status: DC
  Administered 2020-07-11 – 2020-07-13 (×3): 81 mg via ORAL
  Filled 2020-07-11 (×3): qty 1

## 2020-07-11 MED ORDER — INSULIN ASPART 100 UNIT/ML ~~LOC~~ SOLN
0.0000 [IU] | Freq: Every day | SUBCUTANEOUS | Status: DC
  Administered 2020-07-11: 21:00:00 3 [IU] via SUBCUTANEOUS
  Administered 2020-07-12: 22:00:00 4 [IU] via SUBCUTANEOUS

## 2020-07-11 MED ORDER — ACETAMINOPHEN 650 MG RE SUPP: 650.0000 mg | Freq: Four times a day (QID) | RECTAL | Status: DC | PRN

## 2020-07-11 MED ORDER — SODIUM CHLORIDE 0.9 % IV SOLN: 100.0000 mg | Freq: Every day | INTRAVENOUS | Status: DC

## 2020-07-11 MED ORDER — HYDRALAZINE HCL 25 MG PO TABS
75.0000 mg | ORAL_TABLET | Freq: Three times a day (TID) | ORAL | Status: DC
  Administered 2020-07-11 – 2020-07-13 (×7): 75 mg via ORAL
  Filled 2020-07-11 (×8): qty 3

## 2020-07-11 MED ORDER — AMLODIPINE BESYLATE 5 MG PO TABS
10.0000 mg | ORAL_TABLET | Freq: Every day | ORAL | Status: DC
  Administered 2020-07-11 – 2020-07-13 (×3): 10 mg via ORAL
  Filled 2020-07-11 (×3): qty 2

## 2020-07-11 MED ORDER — SODIUM CHLORIDE 0.9 % IV SOLN
100.0000 mg | Freq: Every day | INTRAVENOUS | Status: AC
  Administered 2020-07-12 – 2020-07-13 (×2): 100 mg via INTRAVENOUS
  Filled 2020-07-11 (×2): qty 20

## 2020-07-11 MED ORDER — ADULT MULTIVITAMIN LIQUID CH
15.0000 mL | Freq: Every day | ORAL | Status: DC
  Administered 2020-07-11 – 2020-07-13 (×3): 15 mL via ORAL
  Filled 2020-07-11 (×4): qty 15

## 2020-07-11 MED ORDER — PANTOPRAZOLE SODIUM 40 MG PO TBEC
40.0000 mg | DELAYED_RELEASE_TABLET | Freq: Every day | ORAL | Status: DC
  Administered 2020-07-11 – 2020-07-13 (×3): 40 mg via ORAL
  Filled 2020-07-11 (×3): qty 1

## 2020-07-11 MED ORDER — ACETAMINOPHEN 325 MG PO TABS: 650.0000 mg | ORAL_TABLET | Freq: Four times a day (QID) | ORAL | Status: DC | PRN

## 2020-07-11 MED ORDER — LISINOPRIL 10 MG PO TABS
40.0000 mg | ORAL_TABLET | Freq: Every day | ORAL | Status: DC
Start: 2020-07-11 — End: 2020-07-13
  Administered 2020-07-11 – 2020-07-12 (×3): 40 mg via ORAL
  Filled 2020-07-11 (×3): qty 4

## 2020-07-11 MED ORDER — ONDANSETRON HCL 4 MG PO TABS: 4.0000 mg | ORAL_TABLET | Freq: Four times a day (QID) | ORAL | Status: DC | PRN

## 2020-07-11 MED ORDER — BARICITINIB 2 MG PO TABS
4.0000 mg | ORAL_TABLET | Freq: Every day | ORAL | Status: DC
  Administered 2020-07-11: 4 mg via ORAL
  Filled 2020-07-11: qty 2

## 2020-07-11 MED ORDER — POTASSIUM CHLORIDE 20 MEQ PO PACK
40.0000 meq | PACK | Freq: Once | ORAL | Status: AC
  Administered 2020-07-11: 40 meq via ORAL
  Filled 2020-07-11: qty 2

## 2020-07-11 MED ORDER — ATORVASTATIN CALCIUM 10 MG PO TABS
10.0000 mg | ORAL_TABLET | Freq: Every day | ORAL | Status: DC
  Administered 2020-07-11 – 2020-07-13 (×3): 10 mg via ORAL
  Filled 2020-07-11 (×3): qty 1

## 2020-07-11 MED ORDER — ALBUTEROL SULFATE HFA 108 (90 BASE) MCG/ACT IN AERS: 2.0000 | INHALATION_SPRAY | RESPIRATORY_TRACT | Status: DC | PRN

## 2020-07-11 MED ORDER — ISOSORBIDE MONONITRATE ER 60 MG PO TB24
30.0000 mg | ORAL_TABLET | Freq: Every day | ORAL | Status: DC
  Administered 2020-07-11 – 2020-07-13 (×3): 30 mg via ORAL
  Filled 2020-07-11 (×2): qty 1

## 2020-07-11 MED ORDER — INSULIN DETEMIR 100 UNIT/ML ~~LOC~~ SOLN
10.0000 [IU] | Freq: Every day | SUBCUTANEOUS | Status: DC
  Administered 2020-07-11 – 2020-07-12 (×2): 10 [IU] via SUBCUTANEOUS
  Filled 2020-07-11 (×3): qty 0.1

## 2020-07-11 MED ORDER — METHYLPREDNISOLONE SODIUM SUCC 125 MG IJ SOLR
60.0000 mg | Freq: Two times a day (BID) | INTRAMUSCULAR | Status: DC
  Administered 2020-07-11: 60 mg via INTRAVENOUS
  Filled 2020-07-11: qty 2

## 2020-07-11 MED ORDER — IBANDRONATE SODIUM 150 MG PO TABS: 150.0000 mg | ORAL_TABLET | ORAL | Status: DC

## 2020-07-11 MED ORDER — POLYETHYLENE GLYCOL 3350 17 G PO PACK: 17.0000 g | PACK | Freq: Every day | ORAL | Status: DC | PRN

## 2020-07-11 MED ORDER — INSULIN ASPART 100 UNIT/ML ~~LOC~~ SOLN
0.0000 [IU] | Freq: Three times a day (TID) | SUBCUTANEOUS | Status: DC
  Administered 2020-07-11 (×3): 5 [IU] via SUBCUTANEOUS
  Administered 2020-07-12: 08:00:00 2 [IU] via SUBCUTANEOUS
  Administered 2020-07-12 – 2020-07-13 (×2): 8 [IU] via SUBCUTANEOUS
  Administered 2020-07-13: 3 [IU] via SUBCUTANEOUS

## 2020-07-11 MED ORDER — FUROSEMIDE 10 MG/ML IJ SOLN
40.0000 mg | Freq: Two times a day (BID) | INTRAMUSCULAR | Status: DC
  Administered 2020-07-11 – 2020-07-13 (×5): 40 mg via INTRAVENOUS
  Filled 2020-07-11 (×5): qty 4

## 2020-07-11 MED ORDER — OXYCODONE HCL 5 MG PO TABS
5.0000 mg | ORAL_TABLET | ORAL | Status: DC | PRN
  Administered 2020-07-11: 5 mg via ORAL
  Filled 2020-07-11: qty 1

## 2020-07-11 MED ORDER — METOPROLOL TARTRATE 50 MG PO TABS
50.0000 mg | ORAL_TABLET | Freq: Two times a day (BID) | ORAL | Status: DC
  Administered 2020-07-11 – 2020-07-13 (×5): 50 mg via ORAL
  Filled 2020-07-11 (×6): qty 1

## 2020-07-11 MED ORDER — TRAZODONE HCL 50 MG PO TABS: 25.0000 mg | ORAL_TABLET | Freq: Every evening | ORAL | Status: DC | PRN

## 2020-07-11 MED ORDER — SODIUM CHLORIDE 0.9 % IV SOLN
100.0000 mg | INTRAVENOUS | Status: AC
  Administered 2020-07-11 (×2): 100 mg via INTRAVENOUS
  Filled 2020-07-11 (×2): qty 20

## 2020-07-11 MED ORDER — HEPARIN SODIUM (PORCINE) 5000 UNIT/ML IJ SOLN
5000.0000 [IU] | Freq: Three times a day (TID) | INTRAMUSCULAR | Status: DC
  Administered 2020-07-11 – 2020-07-13 (×7): 5000 [IU] via SUBCUTANEOUS
  Filled 2020-07-11 (×7): qty 1

## 2020-07-11 MED ORDER — CENTRUM PO CHEW
1.0000 | CHEWABLE_TABLET | Freq: Every day | ORAL | Status: DC
  Filled 2020-07-11 (×2): qty 1

## 2020-07-11 NOTE — Progress Notes (Signed)
Pt transferred with belongings to Room 333. Report given to [facility] nurse. No s/s of distress noted. Patient stable at this time.

## 2020-07-11 NOTE — ED Notes (Signed)
Date and time results received: 07/11/20 0057   Test: COVID Critical Value: POSITIVE  Name of Provider Notified: Mertie Clause, DO

## 2020-07-11 NOTE — TOC Initial Note (Signed)
Transition of Care [facility]) - Initial/Assessment Note    Patient Details  Name: [name] MRN: [id number] Date of Birth: 27-Sep-1935  [facility] CM/SW Contact:    Natasha Bence, LCSW Phone Number: 07/11/2020, 3:49 PM  Clinical Narrative:                 Patient is an 85 year old female admitted for Congestive Heart Failure. CSW received a consult for CHF. CSW provided initial assessment and consult. Patient's sister reported that patient follows a heart healthy diet initially to manage her diabetes, but will now be willing to incorporate recommendations for managing CHF. CSW informed patient's sister that it is recommended that patient monitor her fluid intake and weigh her self daily to ensure that she is not retaining fluid. Patient's sister also reported that if referred to [facility] and SNF, she would prefer (ADTS) Aging and disability Transit services who has provided [facility] for her before and [facility]. CSW also observed high readmission risk score. CSW performed readmission risk assesment. TOC to follow.   Expected Discharge Plan: Home/Self Care Barriers to Discharge: Continued Medical Work up   Patient Goals and CMS Choice Patient states their goals for this hospitalization and ongoing recovery are:: Return home CMS Medicare.gov Compare Post Acute Care list provided to:: Patient Choice offered to / list presented to : [place]  Expected Discharge Plan and Services Expected Discharge Plan: Home/Self Care In-house Referral: NA Discharge Planning Services: NA Post Acute Care Choice: NA Living arrangements for the past 2 months: Single Family Home                 DME Arranged: N/A DME Agency: NA       HH Arranged: NA HH Agency: NA        Prior Living Arrangements/Services Living arrangements for the past 2 months: Single Family Home Lives with:: Self,Significant Other Patient language and need for interpreter reviewed:: Yes Do you feel safe going back to the  place where you live?: Yes      Need for Family Participation in Patient Care: Yes (Comment) Care giver support system in place?: Yes (comment)   Criminal Activity/Legal Involvement Pertinent to Current Situation/Hospitalization: No - Comment as needed  Activities of Daily Living Home Assistive Devices/Equipment: Walker (specify type),Eyeglasses ADL Screening (condition at time of admission) Patient's cognitive ability adequate to safely complete daily activities?: Yes Is the patient deaf or have difficulty hearing?: No Does the patient have difficulty seeing, even when wearing glasses/contacts?: No Does the patient have difficulty concentrating, remembering, or making decisions?: No Patient able to express need for assistance with ADLs?: Yes Does the patient have difficulty dressing or bathing?: No Independently performs ADLs?: Yes (appropriate for developmental age) Does the patient have difficulty walking or climbing stairs?: Yes Weakness of Legs: Both Weakness of Arms/Hands: None  Permission Sought/Granted Permission sought to share information with : Chartered certified accountant granted to share information with : Yes, Verbal Permission Granted  Share Information with NAME: Hopper,Hazel  Permission granted to share info w AGENCY: Aging disability [facility], [facility]  Permission granted to share info w Relationship: Sister  Permission granted to share info w Contact Information: [phone]  Emotional Assessment       Orientation: : Oriented to Place,Oriented to Self,Oriented to Situation,Oriented to  Time Alcohol / Substance Use: Not Applicable Psych Involvement: No (comment)  Admission diagnosis:  CHF (congestive heart failure) (HCC) [I50.9] SOB (shortness of breath) [R06.02] Acute on chronic diastolic  congestive heart failure (HCC) [I50.33] Hypertensive urgency [I16.0] Patient Active Problem List   Diagnosis Date Noted  . CHF (congestive heart  failure) ([place]) 07/10/2020  . Bilateral lower extremity edema 06/30/2020  . Chronic combined systolic and diastolic heart failure ([place]) 06/28/2020  . Hypertensive emergency 12/11/2019  . Abnormal EKG 12/11/2019  . Hypomagnesemia 12/11/2019  . Abnormal chest x-ray 12/11/2019  . Hypertensive urgency 12/11/2019  . Glaucoma   . GERD (gastroesophageal reflux disease)   . Hypocalcemia   . Type 2 diabetes mellitus ([place])   . Congestive heart failure ([place])   . SOB (shortness of breath)   . Uncontrolled type 2 diabetes mellitus with hyperglycemia ([place]) 09/01/2019  . Hypertension associated with diabetes ([place]) 09/01/2019  . Hyperlipidemia associated with type 2 diabetes mellitus ([place]) 09/01/2019  . Microalbuminuria due to type 2 diabetes mellitus ([place]) 06/18/2019   PCP:  Janora Norlander, DO Pharmacy:   [facility] [address] [place] [address] [place] [address] Phone: [phone] Fax: [phone]     Social Determinants of Health (SDOH) Interventions    Readmission Risk Interventions Readmission Risk Prevention Plan 07/11/2020  Transportation Screening Complete  PCP or Specialist Appt within 3-5 Days Complete  HRI or [place] Complete  Social Work Consult for [place] Planning/Counseling Complete  Palliative Care Screening Not Applicable  Medication Review Press photographer) Complete  Some recent data might be hidden

## 2020-07-11 NOTE — Progress Notes (Signed)
Patient seen and examined.  Admitted after midnight secondary to shortness of breath, hypertensive crisis and x-ray findings suggesting vascular congestion and pulmonary edema.  Incidental positive findings for COVID.  Hemodynamically stable, with excellent oxygen saturations on room air and no flank bilateral infiltrates on x-ray.  Inflammatory markers minimally elevated.  Patient responded appropriately to diuresis management.  Please refer to H&P written by Dr. Clearence Ped for further info/details on admission.  Plan: -Discontinue baricitinib and the steroids -Will treat with 3 days treatment of remdesivir for incidental asymptomatic positive Covid infection (given patient's risk factors). -Continue IV diuresis -Follow 2D echo results -Continue to follow Daily weights, low-sodium diet.  Barton Dubois MD [phone]

## 2020-07-11 NOTE — H&P (Signed)
TRH H&P    Patient Demographics:    [name], is a 85 y.o. female  MRN: [id number]  DOB - 05-Jun-1935  Admit Date - 07/10/2020  Referring MD/NP/PA: Rogene Houston  Outpatient Primary MD for the patient is Janora Norlander, DO  Patient coming from: Home  Chief complaint- dyspnea   HPI:    [name]  is a 85 y.o. female, patient with history of hypertension, glaucoma, GERD, diabetes mellitus type 2, and more presents the ED with a chief complaint of dyspnea.  Patient reports that it started 3 days ago and has been progressively worse since it started.  Minimal amount of walking causes her to be exquisitely short of breath, which is abnormal for her.  She denies any cough she denies chest pain she denies palpitations.  Patient reports that she has had no fevers or body aches.  She reports that she checks her oxygen saturations at home and they have been around 93%.  Patient reports that she takes her blood pressure medications once per day, and did not realize that some of them are written for 2 or 3 times a day.  She denies any throbbing headaches, blurry vision, dizziness.  She does report that her feet have been cramping while she has been in the ER.  Patient is quite pleasant.  Covid was almost an incidental finding on this patient-testing positive admission screening.  She is not describing any infectious symptoms, and pulmonary edema was present on imaging, which would have adequately explained the dyspnea.  Since she did test positive for Covid, starting her on Covid regimen.  Inflammatory markers to be drawn in the morning.  In the ED Temp 98.2, heart rate 39-1 53, respiratory rate 18-28, blood pressure 166/72, satting at 92% at rest Saturations improved to mid 90s with 2 L nasal cannula Blood pressures at arrival were significantly high at 211/119 Leukocytosis at 11.1, hemoglobin 10.7 Chemistry panel  reveals a potassium of 3.3 otherwise unremarkable BNP is 886 D-dimer was 1.5, CTA did not show pulmonary embolus Metoprolol, hydralazine, Lasix started in the ED CTA showed elevated right heart pressure, bibasilar volume loss, exaggerated thoracic kyphosis, findings consistent for pulmonary edema and a circumscribed 2.1 x 1.7 cm hypodense lesion at the T1-T2 costovertebral junction suspicious for nerve sheath tumor such as schwannoma Views were requested for CHF exacerbation secondary to hypertensive emergency    Review of systems:    In addition to the HPI above,  No Fever-chills, No Headache, No changes with Vision or hearing, No problems swallowing food or Liquids, No Chest pain, Cough endorses dyspnea No Abdominal pain, No Nausea or Vomiting, bowel movements are regular, No Blood in stool or Urine, No dysuria, No new skin rashes or bruises, No new joints pains-aches,  No new weakness, tingling, numbness in any extremity, No recent weight gain or loss, No polyuria, polydypsia or polyphagia, No significant Mental Stressors.  All other systems reviewed and are negative.    Past History of the following :    Past Medical History:  Diagnosis Date  .  Arthritis    OA  . DDD (degenerative disc disease), lumbosacral   . Diabetes mellitus without complication ([place])   . Distal radius fracture    bilateral  . GERD (gastroesophageal reflux disease)   . Glaucoma   . HOH (hard of hearing)    left ear  . Hypertension   . PONV (postoperative nausea and vomiting)       Past Surgical History:  Procedure Laterality Date  . ABDOMINAL HYSTERECTOMY    . BREAST BIOPSY    . BREAST EXCISIONAL BIOPSY Left    benign  . BREAST SURGERY     left milk duct   . EYE SURGERY     cataract  . FRACTURE SURGERY Left    hip fracture  . OPEN REDUCTION INTERNAL FIXATION (ORIF) DISTAL RADIAL FRACTURE Bilateral 05/24/2015   Procedure: OPEN REDUCTION INTERNAL FIXATION (ORIF) BILATERAL DISTAL RADIAL  FRACTURE VOLAR PLATE;  Surgeon: Daryll Brod, MD;  Location: [facility];  Service: Orthopedics;  Laterality: Bilateral;      Social History:      Social History   Tobacco Use  . Smoking status: Never Smoker  . Smokeless tobacco: Never Used  Substance Use Topics  . Alcohol use: No       Family History :     Family History  Problem Relation Age of Onset  . Heart disease Mother   . Heart disease Father       Home Medications:   Prior to Admission medications   Medication Sig Start Date End Date Taking? Authorizing Provider  amLODipine (NORVASC) 10 MG tablet Take 1 tablet (10 mg total) by mouth daily. 09/01/19  Yes [name] M, DO  aspirin 81 MG tablet Take 81 mg by mouth daily.   Yes [provider]  calcium-vitamin D (OSCAL WITH D) 500-200 MG-UNIT tablet Take 1 tablet by mouth.   Yes [provider]  DEXILANT 60 MG capsule Take 1 capsule by mouth daily. 02/13/20  Yes [provider]  fluticasone (FLONASE) 50 MCG/ACT nasal spray Place into both nostrils daily.   Yes [provider]  furosemide (LASIX) 20 MG tablet TAKE 1 TABLET BY MOUTH TWICE DAILY FOR 3 DAYS THEN TAKE 1 TABLET ONCE DAILY Patient taking differently: Take 20 mg by mouth 2 (two) times daily. 06/28/20  Yes Gottschalk, Ashly M, DO  glipiZIDE (GLUCOTROL) 10 MG tablet TAKE 1 TABLET BY MOUTH ONCE DAILY BEFORE BREAKFAST 06/02/20  Yes Gottschalk, Ashly M, DO  hydrALAZINE (APRESOLINE) 25 MG tablet Take 3 tablets (75 mg total) by mouth every 8 (eight) hours. Patient taking differently: Take 75 mg by mouth daily. 12/12/19  Yes Johnson, Clanford L, MD  LANTUS SOLOSTAR 100 UNIT/ML Solostar Pen INJECT 16 UNITS SUBCUTANEOUSLY AT BEDTIME 05/06/20  Yes Gottschalk, Ashly M, DO  latanoprost (XALATAN) 0.005 % ophthalmic solution 1 drop at bedtime.   Yes [provider]  lisinopril (ZESTRIL) 40 MG tablet Take 1 tablet (40 mg total) by mouth daily. 04/19/20  Yes  [name] M, DO  metoprolol tartrate (LOPRESSOR) 50 MG tablet Take 1 tablet (50 mg total) by mouth 2 (two) times daily. 06/02/20  Yes [name] M, DO  multivitamin-iron-minerals-folic acid (CENTRUM) chewable tablet Chew 1 tablet by mouth daily.   Yes [provider]  potassium chloride (KLOR-CON) 10 MEQ tablet Take 1 tablet (10 mEq total) by mouth daily. 12/13/19  Yes Johnson, Clanford L, MD  sitaGLIPtin-metformin (JANUMET) 50-1000 MG tablet TAKE 1 TABLET BY MOUTH TWICE DAILY  WITH A MEAL 06/02/20  Yes [name] M, DO  timolol (TIMOPTIC) 0.5 % ophthalmic solution Place 1 drop into both eyes daily. 01/27/19  Yes [provider]  traZODone (DESYREL) 50 MG tablet Take 0.5-1 tablets (25-50 mg total) by mouth at bedtime as needed for sleep. 06/28/20  Yes [name] M, DO  atorvastatin (LIPITOR) 20 MG tablet Take 0.5 tablets (10 mg total) by mouth daily. Patient not taking: No sig reported 06/02/20   [name] M, DO  glucose blood (ONETOUCH ULTRA) test strip Test BS twice daily Dx E11.9 04/06/20   [name] M, DO  ibandronate (BONIVA) 150 MG tablet Take 150 mg by mouth every 30 (thirty) days. Take in the morning with a full glass of water, on an empty stomach, and do not take anything else by mouth or lie down for the next 30 min. Patient not taking: Reported on 07/10/2020    [provider]     Allergies:     Allergies  Allergen Reactions  . Codeine Nausea Only     Physical Exam:   Vitals  Blood pressure (!) 188/68, pulse 81, temperature 98.8 F (37.1 C), temperature source Oral, resp. rate (!) 29, height 5\' 1"  (1.549 m), weight 58 kg, SpO2 100 %.  1.  General: Patient lying supine in bed, no acute distress  2. Psychiatric: Very pleasant, cooperative with exam, alert and oriented x3, mood and behavior normal  3. Neurologic: Face is symmetric, speech and language are normal, moves all 4 extremities voluntarily, no focal  deficit on rectal exam  4. HEENMT:  Head is atraumatic, normocephalic, pupils reactive to light, neck is supple, trachea is midline, mucous membranes moist  5. Respiratory : Lungs are diminished in the lower lung fields, no crackles, no wheezing, no rhonchi  6. Cardiovascular : Heart rate is normal, rhythm is regular on exam, no murmurs rubs or gallops, trace peripheral edema  7. Gastrointestinal:  Abdomen is soft, nondistended, nontender to palpation  8. Skin:  Skin is warm dry and intact without acute lesion on limited exam  9.Musculoskeletal:  No calf tenderness, peripheral pulses palpated, no acute deformity    Data Review:    CBC Recent Labs  Lab 07/10/20 1940  WBC 11.1*  HGB 10.7*  HCT 35.6*  PLT 376  MCV 87.7  MCH 26.4  MCHC 30.1  RDW 15.1  LYMPHSABS 1.0  MONOABS 1.2*  EOSABS 0.4  BASOSABS 0.1   ------------------------------------------------------------------------------------------------------------------  Results for orders placed or performed during the hospital encounter of 07/10/20 (from the past 48 hour(s))  Comprehensive metabolic panel     Status: Abnormal   Collection Time: 07/10/20  7:40 PM  Result Value Ref Range   Sodium 137 135 - 145 mmol/L   Potassium 3.3 (L) 3.5 - 5.1 mmol/L   Chloride 97 (L) 98 - 111 mmol/L   CO2 26 22 - 32 mmol/L   Glucose, Bld 154 (H) 70 - 99 mg/dL    Comment: Glucose reference range applies only to samples taken after fasting for at least 8 hours.   BUN 21 8 - 23 mg/dL   Creatinine, Ser 0.79 0.44 - 1.00 mg/dL   Calcium 8.4 (L) 8.9 - 10.3 mg/dL   Total Protein 7.3 6.5 - 8.1 g/dL   Albumin 3.5 3.5 - 5.0 g/dL   AST 33 15 - 41 U/L   ALT 30 0 - 44 U/L   Alkaline Phosphatase 90 38 - 126 U/L   Total Bilirubin 0.9  0.3 - 1.2 mg/dL   GFR, Estimated >60 >60 mL/min    Comment: (NOTE) Calculated using the CKD-EPI Creatinine Equation (2021)    Anion gap 14 5 - 15    Comment: Performed at [facility], [address] [place], [place] [address]  CBC with Differential/Platelet     Status: Abnormal   Collection Time: 07/10/20  7:40 PM  Result Value Ref Range   WBC 11.1 (H) 4.0 - 10.5 K/uL   RBC 4.06 3.87 - 5.11 MIL/uL   Hemoglobin 10.7 (L) 12.0 - 15.0 g/dL   HCT 35.6 (L) 36.0 - 46.0 %   MCV 87.7 80.0 - 100.0 fL   MCH 26.4 26.0 - 34.0 pg   MCHC 30.1 30.0 - 36.0 g/dL   RDW 15.1 11.5 - 15.5 %   Platelets 376 150 - 400 K/uL   nRBC 0.0 0.0 - 0.2 %   Neutrophils Relative % 76 %   Neutro Abs 8.4 (H) 1.7 - 7.7 K/uL   Lymphocytes Relative 9 %   Lymphs Abs 1.0 0.7 - 4.0 K/uL   Monocytes Relative 11 %   Monocytes Absolute 1.2 (H) 0.1 - 1.0 K/uL   Eosinophils Relative 3 %   Eosinophils Absolute 0.4 0.0 - 0.5 K/uL   Basophils Relative 1 %   Basophils Absolute 0.1 0.0 - 0.1 K/uL   Immature Granulocytes 0 %   Abs Immature Granulocytes 0.05 0.00 - 0.07 K/uL    Comment: Performed at [facility], [address], [place], [place] [address]  Brain natriuretic peptide     Status: Abnormal   Collection Time: 07/10/20  7:40 PM  Result Value Ref Range   B Natriuretic Peptide 886.0 (H) 0.0 - 100.0 pg/mL    Comment: Performed at [facility], [address], [place], [place] [address]  D-dimer, quantitative     Status: Abnormal   Collection Time: 07/10/20  7:40 PM  Result Value Ref Range   D-Dimer, Quant 1.50 (H) 0.00 - 0.50 ug/mL-FEU    Comment: (NOTE) At the manufacturer cut-off value of 0.5 g/mL FEU, this assay has a negative predictive value of 95-100%.This assay is intended for use in conjunction with a clinical pretest probability (PTP) assessment model to exclude pulmonary embolism (PE) and deep venous thrombosis (DVT) in outpatients suspected of PE or DVT. Results should be correlated with clinical presentation. Performed at [facility], [address] [place], [place] [address]   Resp Panel by RT-PCR (Flu A&B, Covid) Nasopharyngeal Swab     Status: Abnormal   Collection Time: 07/10/20 11:10 PM    Specimen: Nasopharyngeal Swab; Nasopharyngeal(NP) swabs in vial transport medium  Result Value Ref Range   SARS Coronavirus 2 by RT PCR POSITIVE (A) NEGATIVE    Comment: RESULT CALLED TO, READ BACK BY AND VERIFIED WITH: SAPPELT,J @ 0057 ON 07/11/20 BY JUW (NOTE) SARS-CoV-2 target nucleic acids are DETECTED.  The SARS-CoV-2 RNA is generally detectable in upper respiratory specimens during the acute phase of infection. Positive results are indicative of the presence of the identified virus, but do not rule out bacterial infection or co-infection with other pathogens not detected by the test. Clinical correlation with patient history and other diagnostic information is necessary to determine patient infection status. The expected result is Negative.  Fact Sheet for Patients: EntrepreneurPulse.com.au  Fact Sheet for Healthcare Providers: IncredibleEmployment.be  This test is not yet approved or cleared by the [place] FDA and  has been authorized for detection and/or diagnosis of SARS-CoV-2  by FDA under an Emergency Use Authorization (EUA).  This EUA will remain in effect (meaning this test can be  used) for the duration of  the COVID-19 declaration under Section 564(b)(1) of the Act, 21 U.S.C. section 360bbb-3(b)(1), unless the authorization is terminated or revoked sooner.     Influenza A by PCR NEGATIVE NEGATIVE   Influenza B by PCR NEGATIVE NEGATIVE    Comment: (NOTE) The Xpert Xpress SARS-CoV-2/FLU/RSV plus assay is intended as an aid in the diagnosis of influenza from Nasopharyngeal swab specimens and should not be used as a sole basis for treatment. Nasal washings and aspirates are unacceptable for Xpert Xpress SARS-CoV-2/FLU/RSV testing.  Fact Sheet for Patients: EntrepreneurPulse.com.au  Fact Sheet for Healthcare Providers: IncredibleEmployment.be  This test is not yet approved or cleared by  the [place] FDA and has been authorized for detection and/or diagnosis of SARS-CoV-2 by FDA under an Emergency Use Authorization (EUA). This EUA will remain in effect (meaning this test can be used) for the duration of the COVID-19 declaration under Section 564(b)(1) of the Act, 21 U.S.C. section 360bbb-3(b)(1), unless the authorization is terminated or revoked.  Performed at [facility], [address] [place], [place] [address]   Glucose, capillary     Status: Abnormal   Collection Time: 07/11/20  1:47 AM  Result Value Ref Range   Glucose-Capillary 159 (H) 70 - 99 mg/dL    Comment: Glucose reference range applies only to samples taken after fasting for at least 8 hours.    Chemistries  Recent Labs  Lab 07/10/20 1940  NA 137  K 3.3*  CL 97*  CO2 26  GLUCOSE 154*  BUN 21  CREATININE 0.79  CALCIUM 8.4*  AST 33  ALT 30  ALKPHOS 90  BILITOT 0.9   ------------------------------------------------------------------------------------------------------------------  ------------------------------------------------------------------------------------------------------------------ GFR: Estimated Creatinine Clearance: 44.4 mL/min (by C-G formula based on SCr of 0.79 mg/dL). Liver Function Tests: Recent Labs  Lab 07/10/20 1940  AST 33  ALT 30  ALKPHOS 90  BILITOT 0.9  PROT 7.3  ALBUMIN 3.5   No results for input(s): LIPASE, AMYLASE in the last 168 hours. No results for input(s): AMMONIA in the last 168 hours. Coagulation Profile: No results for input(s): INR, PROTIME in the last 168 hours. Cardiac Enzymes: No results for input(s): CKTOTAL, CKMB, CKMBINDEX, TROPONINI in the last 168 hours. BNP (last 3 results) No results for input(s): PROBNP in the last 8760 hours. HbA1C: No results for input(s): HGBA1C in the last 72 hours. CBG: Recent Labs  Lab 07/11/20 0147  GLUCAP 159*   Lipid Profile: No results for input(s): CHOL, HDL, LDLCALC, TRIG, CHOLHDL,  LDLDIRECT in the last 72 hours. Thyroid Function Tests: No results for input(s): TSH, T4TOTAL, FREET4, T3FREE, THYROIDAB in the last 72 hours. Anemia Panel: No results for input(s): VITAMINB12, FOLATE, FERRITIN, TIBC, IRON, RETICCTPCT in the last 72 hours.  --------------------------------------------------------------------------------------------------------------- Urine analysis:    Component Value Date/Time   APPEARANCEUR Clear 09/29/2019 0906   GLUCOSEU Negative 09/29/2019 0906   BILIRUBINUR Negative 09/29/2019 0906   PROTEINUR 2+ (A) 09/29/2019 0906   NITRITE Negative 09/29/2019 0906   LEUKOCYTESUR 2+ (A) 09/29/2019 0906      Imaging Results:    CT Angio Chest PE W/Cm &/Or Wo Cm  Result Date: 07/10/2020 CLINICAL DATA:  PE suspected, low/intermediate prob, positive D-dimer Shortness of breath on exertion. EXAM: CT ANGIOGRAPHY CHEST WITH CONTRAST TECHNIQUE: Multidetector CT imaging of the chest was performed using the standard protocol during bolus administration of intravenous  contrast. Multiplanar CT image reconstructions and MIPs were obtained to evaluate the vascular anatomy. CONTRAST:  176mL OMNIPAQUE IOHEXOL 350 MG/ML SOLN COMPARISON:  Radiograph earlier today. FINDINGS: Distortion of normal anatomy due to severe kyphosis. Cardiovascular: Motion artifact limits assessment, particularly of the upper lobes. Allowing for this, no evidence of pulmonary embolus. Mild cardiomegaly. There is right heart dilatation and reflux of contrast into the hepatic veins and IVC. Dense mitral annulus calcifications. There are coronary artery calcifications. No pericardial effusion. Atherosclerotic tortuous thoracic aorta without dissection or acute aortic finding. Mediastinum/Nodes: Scattered small mediastinal lymph nodes are not enlarged by size criteria. No hilar adenopathy. Left thyroid calcifications without dominant nodule. No esophageal wall thickening. Lungs/Pleura: Breathing motion limits  assessment, particularly of the upper lungs. Bibasilar volume loss. Probable apical septal thickening with areas of ground-glass opacity and bronchial thickening. Favor pulmonary edema. No confluent consolidation. Trace right pleural effusion. Upper Abdomen: Contrast refluxes into the hepatic veins and IVC. No other acute upper abdominal findings. Musculoskeletal: Exaggerated thoracic kyphosis. Severe T7 compression fracture with vertebral plana appearance. Mild T3 superior endplate compression fracture. Moderate T12 compression fracture. L1 compression fracture only partially included in the field of view. There is a well-circumscribed 2.1 x 1.7 cm hypodense lesion the left T1-T2 costovertebral junction, suspicious for nerve sheath tumor. No surrounding inflammation. A smaller but similar lesion is seen on the right T1-T2. Review of the MIP images confirms the above findings. IMPRESSION: 1. No pulmonary embolus allowing for motion artifact. 2. Cardiomegaly with reflux of contrast into the hepatic veins and IVC consistent with elevated right heart pressures. Findings suspicious for pulmonary edema, though detailed assessment is limited by motion. Trace right pleural effusion. 3. Bibasilar volume loss. 4. Exaggerated thoracic kyphosis with multiple compression fractures, severe at T7. 5. Well-circumscribed 2.1 x 1.7 cm hypodense lesion at the left T1-T2 costovertebral junction, suspicious for nerve sheath tumor such as schwannoma. Possibility of tortuous vascular structure is also considered but felt less likely. A smaller but similar lesion is seen on the right at same level. Aortic Atherosclerosis (ICD10-I70.0). Electronically Signed   By: Keith Rake M.D.   On: 07/10/2020 21:53   DG Chest Port 1 View  Result Date: 07/10/2020 CLINICAL DATA:  Shortness of breath on exertion. EXAM: PORTABLE CHEST 1 VIEW COMPARISON:  06/02/2020 FINDINGS: Patient is rotated. Similar low lung volumes to prior. Heart size  difficult to assess given positioning lung grossly stable. Slight increase in interstitial opacities from prior which may represent pulmonary edema. No confluent airspace disease or pleural effusion. Bones are subjectively under mineralized IMPRESSION: 1. Slight increase in interstitial opacities from prior which may represent pulmonary edema. 2. Low lung volumes limit assessment. Electronically Signed   By: Keith Rake M.D.   On: 07/10/2020 20:10    My personal review of EKG: Rhythm sinus arrhythmia, Rate 93/min, QTc 484, diffuse repolarization abnormalities  Assessment & Plan:    Active Problems:   CHF (congestive heart failure) ([place])   1. Hypertensive emergency 1. Blood pressure as high as 211/119 2. Suspect nonadherence to medical regimen as patient does not know how often she is supposed to take her meds 3. Hydralazine and metoprolol, Briefly improved blood pressure but then increased again 4. Nitro drip started 5. Continue to monitor 2. CHF with pulmonary edema 1. Patient with pulmonary edema on imaging, BNP 886 on monitor satting at 90% at rest, dyspneic with exertion 2. Secondary to hypertensive emergency 3. See plan above 4. Lisinopril, metoprolol, statin, aspirin 5. Currently  holding Lasix p.o. as we are getting Lasix IV 6. Monitor intake and output, daily weights 7. Update echocardiogram, previous echo was in August 2021 and showed grade 2 diastolic dysfunction 8. Continue to monitor 3. Elevated D-dimer 1. D-dimer 1.5, CTA does not show pulmonary embolism 4. Covid infection 1. Patient reports no sick contacts 2. 2 L oxygen requirement, satting 90% at rest 3. Start baricitinib, Solu-Medrol, inhalers 4. Inflammatory markers to be drawn a.m. blood 5. Continue to monitor 5. Hypokalemia 1. Replace and recheck 6. Diabetes mellitus type 2 1. Patient takes 60 units of long-acting insulin at home 2. Reduced to 10 units while in the hospital, continue sliding scale  coverage, carb modified diet 3. Last hemoglobin A1c was 6.5 7. Abnormal EKG 1. Sinus arrhythmia, without patient having documented history of arrhythmia 2. Likely secondary to pulmonary edema, Covid, CHF, hypertensive emergency -multifactorial 3. No chest pain 4. Repeat EKG in the a.m. 8. Spinal lesion 1. 2.1 x 1.7 cm hypodense lesion at the left T1-T2 costovertebral junction, suspicious for nerve sheath tumor such as schwannoma.  Possibly tortuous vascular structure is also considered but less likely. 2. Nonemergent follow-up will be needed 3. Consider MRI for more characterization   DVT Prophylaxis-Heparin- SCDs   AM Labs Ordered, also please review Full Orders  Family Communication: No family at bedside Code Status: Full Admission status: Inpatient :The appropriate admission status for this patient is INPATIENT. Inpatient status is judged to be reasonable and necessary in order to provide the required intensity of service to ensure the patient's safety. The patient's presenting symptoms, physical exam findings, and initial radiographic and laboratory data in the context of their chronic comorbidities is felt to place them at high risk for further clinical deterioration. Furthermore, it is not anticipated that the patient will be medically stable for discharge from the hospital within 2 midnights of admission. The following factors support the admission status of inpatient.     The patient's presenting symptoms include shortness of breath The worrisome physical exam findings include hypoxia requiring 2 L nasal cannula The initial radiographic and laboratory data are worrisome because of BNP 886, blood pressures as high as 211/119, CTA showing elevated right heart pressure The chronic co-morbidities include hypertension, GERD, diabetes mellitus       * I certify that at the point of admission it is my clinical judgment that the patient will require inpatient hospital care spanning  beyond 2 midnights from the point of admission due to high intensity of service, high risk for further deterioration and high frequency of surveillance required.*  Time spent in minutes : [place]

## 2020-07-11 NOTE — Progress Notes (Signed)
*  PRELIMINARY RESULTS* Echocardiogram 2D Echocardiogram has been performed.  [name] 07/11/2020, 8:35 AM

## 2020-07-11 NOTE — TOC Transition Note (Deleted)
Transition of Care [facility]) - CM/SW Discharge Note   Patient Details  Name: [name] MRN: [id number] Date of Birth: 12-03-1935  Transition of Care [facility]) CM/SW Contact:  Natasha Bence, LCSW Phone Number: 07/11/2020, 3:43 PM   Clinical Narrative:    Patient is an 85 year old female admitted for Congestive Heart Failure. CSW received a consult for CHF. CSW provided initial assessment and consult. Patient's sister reported that patient follows a heart healthy diet initially to manage her diabetes, but will now be willing to incorporate recommendations for managing CHF. CSW informed patient's sister that it is recommended that patient monitor her fluid intake and weigh her self daily to ensure that she is not retaining fluid. Patient's sister also reported that if referred to [facility] and SNF, she would prefer ([facility]) Aging and disability Transit services who has provided [facility] for her before and [facility]. TOC to follow.    Final next level of care: Home/Self Care Barriers to Discharge: Continued Medical Work up   Patient Goals and CMS Choice Patient states their goals for this hospitalization and ongoing recovery are:: Return home CMS Medicare.gov Compare Post Acute Care list provided to:: Patient Choice offered to / list presented to : [place]  Discharge Placement                       Discharge Plan and Services In-house Referral: NA Discharge Planning Services: NA Post Acute Care Choice: NA          DME Arranged: N/A DME Agency: NA       HH Arranged: NA HH Agency: NA        Social Determinants of Health (SDOH) Interventions     Readmission Risk Interventions No flowsheet data found.

## 2020-07-12 ENCOUNTER — Ambulatory Visit: Payer: Medicare Other | Admitting: Family Medicine

## 2020-07-12 DIAGNOSIS — I5033 Acute on chronic diastolic (congestive) heart failure: Secondary | ICD-10-CM | POA: Diagnosis not present

## 2020-07-12 DIAGNOSIS — R0602 Shortness of breath: Secondary | ICD-10-CM | POA: Diagnosis not present

## 2020-07-12 DIAGNOSIS — I160 Hypertensive urgency: Secondary | ICD-10-CM | POA: Diagnosis not present

## 2020-07-12 LAB — GLUCOSE, CAPILLARY
Glucose-Capillary: 129 mg/dL — ABNORMAL HIGH (ref 70–99)
Glucose-Capillary: 286 mg/dL — ABNORMAL HIGH (ref 70–99)
Glucose-Capillary: 69 mg/dL — ABNORMAL LOW (ref 70–99)
Glucose-Capillary: 309 mg/dL — ABNORMAL HIGH (ref 70–99)

## 2020-07-12 LAB — PROCALCITONIN: Procalcitonin: <0.1 ng/mL

## 2020-07-12 MED ORDER — TRAZODONE HCL 50 MG PO TABS
50.0000 mg | ORAL_TABLET | Freq: Every day | ORAL | Status: DC
  Administered 2020-07-12: 50 mg via ORAL
  Filled 2020-07-12: qty 1

## 2020-07-12 NOTE — Progress Notes (Signed)
PROGRESS NOTE    [name]  LPF:[id number] DOB: 04/18/1936 DOA: 07/10/2020 PCP: Janora Norlander, DO    Chief Complaint  Patient presents with  . Shortness of Breath    Brief Narrative:  [name]  is a 85 y.o. female, patient with history of hypertension, glaucoma, GERD, diabetes mellitus type 2, and more presents the ED with a chief complaint of dyspnea.  Patient reports that it started 3 days ago and has been progressively worse since it started.  Minimal amount of walking causes her to be exquisitely short of breath, which is abnormal for her.  She denies any cough she denies chest pain she denies palpitations.  Patient reports that she has had no fevers or body aches.  She reports that she checks her oxygen saturations at home and they have been around 93%.  Patient reports that she takes her blood pressure medications once per day, and did not realize that some of them are written for 2 or 3 times a day.  She denies any throbbing headaches, blurry vision, dizziness.  She does report that her feet have been cramping while she has been in the ER.  Patient is quite pleasant.  Covid was almost an incidental finding on this patient-testing positive admission screening.  She is not describing any infectious symptoms, and pulmonary edema was present on imaging, which would have adequately explained the dyspnea.  Since she did test positive for Covid, starting her on Covid regimen.  Inflammatory markers to be drawn in the morning.  In the ED Temp 98.2, heart rate 39-1 53, respiratory rate 18-28, blood pressure 166/72, satting at 92% at rest Saturations improved to mid 90s with 2 L nasal cannula Blood pressures at arrival were significantly high at 211/119 Leukocytosis at 11.1, hemoglobin 10.7 Chemistry panel reveals a potassium of 3.3 otherwise unremarkable BNP is 886 D-dimer was 1.5, CTA did not show pulmonary embolus Metoprolol, hydralazine, Lasix started in the ED CTA showed  elevated right heart pressure, bibasilar volume loss, exaggerated thoracic kyphosis, findings consistent for pulmonary edema and a circumscribed 2.1 x 1.7 cm hypodense lesion at the T1-T2 costovertebral junction suspicious for nerve sheath tumor such as schwannoma Views were requested for CHF exacerbation secondary to hypertensive emergency  Assessment & Plan: 1-acute on chronic diastolic heart failure -Continue to follow daily weights, strict intake and output -Continue low-sodium diet -Continue IV diuresis -Patient reporting improvement in her breathing; still with mild orthopnea and short winded sensation with activity.  2-COVID-19 infection -Completely asymptomatic -No significant elevation of her inflammatory markers -No hypoxic and no meeting criteria for steroids at this time. -Complete 3 days of remdesivir given her isks factors  3-hypertensive emergency -Control after the use of nitroglycerin drip -Continue metoprolol, hydralazine, imdur, lisinopril and Lasix. -Sodium diet encourage  4-elevated D-dimer -CT angio has rule out pulmonary embolism -Continue DVT prophylaxis.  5-hypokalemia -Repleted -Continue to follow electrolytes trend.  6-type 2 diabetes mellitus -Continue sliding scale insulin and long-acting -Recent A1c 6.5 -Continue multivitamin hydrate diet.  7-Spinal lesion 1. 2.1 x 1.7 cm hypodense lesion at the left T1-T2 costovertebral junction, suspicious for nerve sheath tumor such as schwannoma.  Possibly tortuous vascular structure is also considered but less likely. 2. Nonemergent follow-up will be needed 3. Consider MRI for more characterization   DVT prophylaxis: Heparin Code Status: Full code. Family Communication: Husband at bedside. Disposition:   Status is: Inpatient  Dispo: The patient is from: Home  Anticipated d/c is to: Home              Patient currently no medically stable for discharge; continue IV diuresis for another 24  hours to further stabilize volume status.  Complete remdesivir infusion day #3 on 07/13/2020.   Difficult to place patient no    Consultants:   None   Procedures:  See below for x-ray report.   Antimicrobials/antiviral -Remdesivir day 2 out of 3   Subjective: Afebrile, no chest pain, no nausea, no vomiting.  Patient is not requiring oxygen supplementation.  Still feeling short winded with activity and reporting mild orthopnea.  Otherwise significantly improved and in no distress.  No overnight events reported  Objective: Vitals:   07/12/20 0550 07/12/20 0653 07/12/20 1253 07/12/20 1317  BP: 97/83 (!) 155/52 (!) 118/50   Pulse: 63 68 (!) 48 69  Resp: 20 16 19    Temp: 98.9 F (37.2 C) 98.6 F (37 C) 98 F (36.7 C)   TempSrc: Oral Oral Oral   SpO2: 93% 94% 96%   Weight:      Height:        Intake/Output Summary (Last 24 hours) at 07/12/2020 1728 Last data filed at 07/12/2020 1300 Gross per 24 hour  Intake 494.78 ml  Output --  Net 494.78 ml   Filed Weights   07/10/20 1816 07/11/20 0151 07/11/20 2213  Weight: 59.4 kg 58 kg 57.3 kg    Examination:  General exam: Appears calm and comfortable; no fever, no chest pain, no nausea, no vomiting.  No requiring oxygen supplementation.  Slightly short winded with activity and complaining of mild orthopnea. Respiratory system: Fine crackles at the bases; no wheezing, no using accessory muscles. Cardiovascular system: S1 & S2 heard, RRR. No JVD, murmurs, rubs, gallops or clicks. No pedal edema. Gastrointestinal system: Abdomen is nondistended, soft and nontender. No organomegaly or masses felt. Normal bowel sounds heard. Central nervous system: Alert and oriented. No focal neurological deficits. Extremities: Trace edema bilaterally; no cyanosis or clubbing. Skin: No petechiae. Psychiatry: Judgement and insight appear normal. Mood & affect appropriate.     Data Reviewed: I have personally reviewed following labs and imaging  studies  CBC: Recent Labs  Lab 07/10/20 1940 07/11/20 0418  WBC 11.1* 10.4  NEUTROABS 8.4*  --   HGB 10.7* 10.2*  HCT 35.6* 32.6*  MCV 87.7 85.8  PLT 376 630    Basic Metabolic Panel: Recent Labs  Lab 07/10/20 1940 07/11/20 0418  NA 137 137  K 3.3* 3.8  CL 97* 95*  CO2 26 27  GLUCOSE 154* 156*  BUN 21 19  CREATININE 0.79 0.73  CALCIUM 8.4* 8.0*  MG  --  1.1*    GFR: Estimated Creatinine Clearance: 40.9 mL/min (by C-G formula based on SCr of 0.73 mg/dL).  Liver Function Tests: Recent Labs  Lab 07/10/20 1940 07/11/20 0418  AST 33 35  ALT 30 36  ALKPHOS 90 82  BILITOT 0.9 0.9  PROT 7.3 6.7  ALBUMIN 3.5 3.1*    CBG: Recent Labs  Lab 07/11/20 1640 07/11/20 2026 07/12/20 0739 07/12/20 1111 07/12/20 1617  GLUCAP 229* 251* 129* 286* 69*     Recent Results (from the past 240 hour(s))  Resp Panel by RT-PCR (Flu A&B, Covid) Nasopharyngeal Swab     Status: Abnormal   Collection Time: 07/10/20 11:10 PM   Specimen: Nasopharyngeal Swab; Nasopharyngeal(NP) swabs in vial transport medium  Result Value Ref Range Status   SARS Coronavirus 2 by RT  PCR POSITIVE (A) NEGATIVE Final    Comment: RESULT CALLED TO, READ BACK BY AND VERIFIED WITH: SAPPELT,J @ 0057 ON 07/11/20 BY JUW (NOTE) SARS-CoV-2 target nucleic acids are DETECTED.  The SARS-CoV-2 RNA is generally detectable in upper respiratory specimens during the acute phase of infection. Positive results are indicative of the presence of the identified virus, but do not rule out bacterial infection or co-infection with other pathogens not detected by the test. Clinical correlation with patient history and other diagnostic information is necessary to determine patient infection status. The expected result is Negative.  Fact Sheet for Patients: EntrepreneurPulse.com.au  Fact Sheet for Healthcare Providers: IncredibleEmployment.be  This test is not yet approved or cleared by  the [place] FDA and  has been authorized for detection and/or diagnosis of SARS-CoV-2 by FDA under an Emergency Use Authorization (EUA).  This EUA will remain in effect (meaning this test can be  used) for the duration of  the COVID-19 declaration under Section 564(b)(1) of the Act, 21 U.S.C. section 360bbb-3(b)(1), unless the authorization is terminated or revoked sooner.     Influenza A by PCR NEGATIVE NEGATIVE Final   Influenza B by PCR NEGATIVE NEGATIVE Final    Comment: (NOTE) The Xpert Xpress SARS-CoV-2/FLU/RSV plus assay is intended as an aid in the diagnosis of influenza from Nasopharyngeal swab specimens and should not be used as a sole basis for treatment. Nasal washings and aspirates are unacceptable for Xpert Xpress SARS-CoV-2/FLU/RSV testing.  Fact Sheet for Patients: EntrepreneurPulse.com.au  Fact Sheet for Healthcare Providers: IncredibleEmployment.be  This test is not yet approved or cleared by the [place] FDA and has been authorized for detection and/or diagnosis of SARS-CoV-2 by FDA under an Emergency Use Authorization (EUA). This EUA will remain in effect (meaning this test can be used) for the duration of the COVID-19 declaration under Section 564(b)(1) of the Act, 21 U.S.C. section 360bbb-3(b)(1), unless the authorization is terminated or revoked.  Performed at [facility], [address] [place], [place] [address]   MRSA PCR Screening     Status: None   Collection Time: 07/11/20  1:37 AM   Specimen: Nasopharyngeal  Result Value Ref Range Status   MRSA by PCR NEGATIVE NEGATIVE Final    Comment:        The GeneXpert MRSA Assay (FDA approved for NASAL specimens only), is one component of a comprehensive MRSA colonization surveillance program. It is not intended to diagnose MRSA infection nor to guide or monitor treatment for MRSA infections. Performed at [facility], [address], [place],  [place] [address]      Radiology Studies: CT Angio Chest PE W/Cm &/Or Wo Cm  Result Date: 07/10/2020 CLINICAL DATA:  PE suspected, low/intermediate prob, positive D-dimer Shortness of breath on exertion. EXAM: CT ANGIOGRAPHY CHEST WITH CONTRAST TECHNIQUE: Multidetector CT imaging of the chest was performed using the standard protocol during bolus administration of intravenous contrast. Multiplanar CT image reconstructions and MIPs were obtained to evaluate the vascular anatomy. CONTRAST:  130mL OMNIPAQUE IOHEXOL 350 MG/ML SOLN COMPARISON:  Radiograph earlier today. FINDINGS: Distortion of normal anatomy due to severe kyphosis. Cardiovascular: Motion artifact limits assessment, particularly of the upper lobes. Allowing for this, no evidence of pulmonary embolus. Mild cardiomegaly. There is right heart dilatation and reflux of contrast into the hepatic veins and IVC. Dense mitral annulus calcifications. There are coronary artery calcifications. No pericardial effusion. Atherosclerotic tortuous thoracic aorta without dissection or acute aortic finding. Mediastinum/Nodes: Scattered small mediastinal lymph nodes are not  enlarged by size criteria. No hilar adenopathy. Left thyroid calcifications without dominant nodule. No esophageal wall thickening. Lungs/Pleura: Breathing motion limits assessment, particularly of the upper lungs. Bibasilar volume loss. Probable apical septal thickening with areas of ground-glass opacity and bronchial thickening. Favor pulmonary edema. No confluent consolidation. Trace right pleural effusion. Upper Abdomen: Contrast refluxes into the hepatic veins and IVC. No other acute upper abdominal findings. Musculoskeletal: Exaggerated thoracic kyphosis. Severe T7 compression fracture with vertebral plana appearance. Mild T3 superior endplate compression fracture. Moderate T12 compression fracture. L1 compression fracture only partially included in the field of view. There is a well-circumscribed 2.1  x 1.7 cm hypodense lesion the left T1-T2 costovertebral junction, suspicious for nerve sheath tumor. No surrounding inflammation. A smaller but similar lesion is seen on the right T1-T2. Review of the MIP images confirms the above findings. IMPRESSION: 1. No pulmonary embolus allowing for motion artifact. 2. Cardiomegaly with reflux of contrast into the hepatic veins and IVC consistent with elevated right heart pressures. Findings suspicious for pulmonary edema, though detailed assessment is limited by motion. Trace right pleural effusion. 3. Bibasilar volume loss. 4. Exaggerated thoracic kyphosis with multiple compression fractures, severe at T7. 5. Well-circumscribed 2.1 x 1.7 cm hypodense lesion at the left T1-T2 costovertebral junction, suspicious for nerve sheath tumor such as schwannoma. Possibility of tortuous vascular structure is also considered but felt less likely. A smaller but similar lesion is seen on the right at same level. Aortic Atherosclerosis (ICD10-I70.0). Electronically Signed   By: Keith Rake M.D.   On: 07/10/2020 21:53   DG Chest Port 1 View  Result Date: 07/10/2020 CLINICAL DATA:  Shortness of breath on exertion. EXAM: PORTABLE CHEST 1 VIEW COMPARISON:  06/02/2020 FINDINGS: Patient is rotated. Similar low lung volumes to prior. Heart size difficult to assess given positioning lung grossly stable. Slight increase in interstitial opacities from prior which may represent pulmonary edema. No confluent airspace disease or pleural effusion. Bones are subjectively under mineralized IMPRESSION: 1. Slight increase in interstitial opacities from prior which may represent pulmonary edema. 2. Low lung volumes limit assessment. Electronically Signed   By: Keith Rake M.D.   On: 07/10/2020 20:10   ECHOCARDIOGRAM COMPLETE  Result Date: 07/11/2020    ECHOCARDIOGRAM REPORT   Patient Name:   [name] Date of Exam: 07/11/2020 Medical Rec #:  [id number]     Height:       61.0 in Accession #:     [id number]    Weight:       127.9 lb Date of Birth:  Mar 29, 1936    BSA:          1.562 m Patient Age:    72 years      BP:           148/105 mmHg Patient Gender: F             HR:           78 bpm. Exam Location:  [facility] Procedure: 2D Echo Indications:    Congestive Heart Failure I50.9  History:        Patient has prior history of Echocardiogram examinations, most                 recent 12/11/2019. CHF, Abnormal ECG; Risk Factors:Non-Smoker,                 Diabetes, Dyslipidemia and Hypertension. GERD.  Sonographer:    Leavy Cella RDCS (AE) Referring Phys: [id number] ASIA B [place]  IMPRESSIONS  1. Left ventricular ejection fraction, by estimation, is 70 to 75%. The left ventricle has hyperdynamic function. The left ventricle has no regional wall motion abnormalities. There is moderate left ventricular hypertrophy. Left ventricular diastolic parameters are indeterminate.  2. Right ventricular systolic function is normal. The right ventricular size is normal. There is severely elevated pulmonary artery systolic pressure. The estimated right ventricular systolic pressure is 70.6 mmHg.  3. Left atrial size was moderately dilated.  4. The mitral valve is abnormal. Moderate mitral valve regurgitation. Severe mitral annular calcification.  5. The aortic valve is tricuspid. Aortic valve regurgitation is not visualized.  6. Pulmonic valve regurgitation is moderate.  7. The inferior vena cava is normal in size with <50% respiratory variability, suggesting right atrial pressure of 8 mmHg. FINDINGS  Left Ventricle: Left ventricular ejection fraction, by estimation, is 70 to 75%. The left ventricle has hyperdynamic function. The left ventricle has no regional wall motion abnormalities. The left ventricular internal cavity size was normal in size. There is moderate left ventricular hypertrophy. Left ventricular diastolic parameters are indeterminate. Right Ventricle: The right ventricular size is normal. No increase  in right ventricular wall thickness. Right ventricular systolic function is normal. There is severely elevated pulmonary artery systolic pressure. The tricuspid regurgitant velocity is 4.42 m/s, and with an assumed right atrial pressure of 8 mmHg, the estimated right ventricular systolic pressure is 23.7 mmHg. Left Atrium: Left atrial size was moderately dilated. Right Atrium: Right atrial size was normal in size. Pericardium: There is no evidence of pericardial effusion. Mitral Valve: The mitral valve is abnormal. Severe mitral annular calcification. Moderate mitral valve regurgitation. MV peak gradient, 6.8 mmHg. The mean mitral valve gradient is 2.0 mmHg. Tricuspid Valve: The tricuspid valve is grossly normal. Tricuspid valve regurgitation is mild. Aortic Valve: The aortic valve is tricuspid. There is mild to moderate aortic valve annular calcification. Aortic valve regurgitation is not visualized. Aortic valve mean gradient measures 3.0 mmHg. Aortic valve peak gradient measures 5.9 mmHg. Aortic valve area, by VTI measures 1.75 cm. Pulmonic Valve: The pulmonic valve was thickened with good excursion. Pulmonic valve regurgitation is moderate. Aorta: The aortic root is normal in size and structure. Venous: The inferior vena cava is normal in size with less than 50% respiratory variability, suggesting right atrial pressure of 8 mmHg. IAS/Shunts: No atrial level shunt detected by color flow Doppler.  LEFT VENTRICLE PLAX 2D LVIDd:         3.53 cm  Diastology LVIDs:         1.84 cm  LV e' medial:    5.44 cm/s LV PW:         1.50 cm  LV E/e' medial:  22.1 LV IVS:        1.25 cm  LV e' lateral:   7.51 cm/s LVOT diam:     1.70 cm  LV E/e' lateral: 16.0 LV SV:         52 LV SV Index:   33 LVOT Area:     2.27 cm  RIGHT VENTRICLE RV S prime:     11.90 cm/s LEFT ATRIUM             Index       RIGHT ATRIUM           Index LA diam:        4.70 cm 3.01 cm/m  RA Area:     10.90 cm LA Vol (A2C):   74.9 ml 47.96 ml/m RA  Volume:   26.10 ml  16.71 ml/m LA Vol (A4C):   79.8 ml 51.10 ml/m LA Biplane Vol: 78.2 ml 50.07 ml/m  AORTIC VALVE AV Area (Vmax):    1.97 cm AV Area (Vmean):   1.84 cm AV Area (VTI):     1.75 cm AV Vmax:           121.00 cm/s AV Vmean:          82.500 cm/s AV VTI:            0.296 m AV Peak Grad:      5.9 mmHg AV Mean Grad:      3.0 mmHg LVOT Vmax:         105.00 cm/s LVOT Vmean:        66.700 cm/s LVOT VTI:          0.228 m LVOT/AV VTI ratio: 0.77  AORTA Ao Root diam: 2.80 cm MITRAL VALVE                TRICUSPID VALVE MV Area (PHT): 4.68 cm     TR Peak grad:   78.1 mmHg MV Area VTI:   1.34 cm     TR Vmax:        442.00 cm/s MV Peak grad:  6.8 mmHg MV Mean grad:  2.0 mmHg     SHUNTS MV Vmax:       1.30 m/s     Systemic VTI:  0.23 m MV Vmean:      57.1 cm/s    Systemic Diam: 1.70 cm MV Decel Time: 162 msec MV E velocity: 120.00 cm/s MV A velocity: 82.60 cm/s MV E/A ratio:  1.45 Rozann Lesches MD Electronically signed by Rozann Lesches MD Signature Date/Time: 07/11/2020/10:40:28 AM    Final    Scheduled Meds: . amLODipine  10 mg Oral Daily  . aspirin EC  81 mg Oral Daily  . atorvastatin  10 mg Oral Daily  . furosemide  40 mg Intravenous BID  . heparin  5,000 Units Subcutaneous Q8H  . hydrALAZINE  75 mg Oral Q8H  . insulin aspart  0-15 Units Subcutaneous TID WC  . insulin aspart  0-5 Units Subcutaneous QHS  . insulin detemir  10 Units Subcutaneous QHS  . isosorbide mononitrate  30 mg Oral Daily  . lisinopril  40 mg Oral QHS  . metoprolol tartrate  50 mg Oral BID  . multivitamin  15 mL Oral Daily  . pantoprazole  40 mg Oral Daily  . traZODone  50 mg Oral QHS   Continuous Infusions: . remdesivir 100 mg in NS 100 mL 100 mg (07/12/20 0857)     LOS: 2 days    Time spent: 30 minutes    Barton Dubois, MD [facility]   To contact the attending provider between 7A-7P or the covering provider during after hours 7P-7A, please log into the web site [email or web address] and access using  universal [place] password for that web site. If you do not have the password, please call the hospital operator.  07/12/2020, 5:28 PM

## 2020-07-12 NOTE — Plan of Care (Signed)
  Problem: Education: Goal: Knowledge of General Education information will improve Description: Including pain rating scale, medication(s)/side effects and non-pharmacologic comfort measures Outcome: Progressing   Problem: Health Behavior/Discharge Planning: Goal: Ability to manage health-related needs will improve Outcome: Progressing   Problem: Clinical Measurements: Goal: Ability to maintain clinical measurements within normal limits will improve Outcome: Progressing Goal: Will remain free from infection Outcome: Progressing Goal: Diagnostic test results will improve Outcome: Progressing Goal: Respiratory complications will improve Outcome: Progressing Goal: Cardiovascular complication will be avoided Outcome: Progressing   Problem: Activity: Goal: Risk for activity intolerance will decrease Outcome: Progressing   Problem: Nutrition: Goal: Adequate nutrition will be maintained Outcome: Progressing   Problem: Coping: Goal: Level of anxiety will decrease Outcome: Progressing   Problem: Elimination: Goal: Will not experience complications related to bowel motility Outcome: Progressing Goal: Will not experience complications related to urinary retention Outcome: Progressing   Problem: Pain Managment: Goal: General experience of comfort will improve Outcome: Progressing   Problem: Safety: Goal: Ability to remain free from injury will improve Outcome: Progressing   Problem: Skin Integrity: Goal: Risk for impaired skin integrity will decrease Outcome: Progressing   Problem: Education: Goal: Ability to demonstrate management of disease process will improve Outcome: Progressing Goal: Ability to verbalize understanding of medication therapies will improve Outcome: Progressing Goal: Individualized Educational Video(s) Outcome: Progressing   Problem: Activity: Goal: Capacity to carry out activities will improve Outcome: Progressing   Problem: Cardiac: Goal:  Ability to achieve and maintain adequate cardiopulmonary perfusion will improve Outcome: Progressing   Problem: Education: Goal: Knowledge of risk factors and measures for prevention of condition will improve Outcome: Progressing   Problem: Coping: Goal: Psychosocial and spiritual needs will be supported Outcome: Progressing   Problem: Respiratory: Goal: Will maintain a patent airway Outcome: Progressing Goal: Complications related to the disease process, condition or treatment will be avoided or minimized Outcome: Progressing

## 2020-07-13 ENCOUNTER — Telehealth: Payer: Self-pay

## 2020-07-13 DIAGNOSIS — E785 Hyperlipidemia, unspecified: Secondary | ICD-10-CM

## 2020-07-13 DIAGNOSIS — I160 Hypertensive urgency: Secondary | ICD-10-CM | POA: Diagnosis not present

## 2020-07-13 DIAGNOSIS — I5033 Acute on chronic diastolic (congestive) heart failure: Secondary | ICD-10-CM | POA: Diagnosis not present

## 2020-07-13 DIAGNOSIS — E1169 Type 2 diabetes mellitus with other specified complication: Secondary | ICD-10-CM | POA: Diagnosis not present

## 2020-07-13 DIAGNOSIS — R0602 Shortness of breath: Secondary | ICD-10-CM | POA: Diagnosis not present

## 2020-07-13 DIAGNOSIS — K219 Gastro-esophageal reflux disease without esophagitis: Secondary | ICD-10-CM

## 2020-07-13 DIAGNOSIS — G4700 Insomnia, unspecified: Secondary | ICD-10-CM

## 2020-07-13 DIAGNOSIS — U071 COVID-19: Secondary | ICD-10-CM

## 2020-07-13 DIAGNOSIS — Z794 Long term (current) use of insulin: Secondary | ICD-10-CM

## 2020-07-13 LAB — GLUCOSE, CAPILLARY
Glucose-Capillary: 166 mg/dL — ABNORMAL HIGH (ref 70–99)
Glucose-Capillary: 263 mg/dL — ABNORMAL HIGH (ref 70–99)

## 2020-07-13 MED ORDER — FUROSEMIDE 40 MG PO TABS: 40.0000 mg | ORAL_TABLET | Freq: Every day | ORAL | 3 refills | Status: DC

## 2020-07-13 MED ORDER — ISOSORBIDE MONONITRATE ER 30 MG PO TB24: 30.0000 mg | ORAL_TABLET | Freq: Every day | ORAL | 2 refills | Status: DC

## 2020-07-13 NOTE — Progress Notes (Signed)
Alert and oriented x 3. Denies [place] or discomfort. Able to ambulate to bathroom with walker. Continues on airborne and contact isolation for covid-19. On room air with no s/s of distress. Calm and pleasant. Bed alarm in use while in bed.

## 2020-07-13 NOTE — Plan of Care (Signed)
  Problem: Education: Goal: Knowledge of General Education information will improve Description: Including pain rating scale, medication(s)/side effects and non-pharmacologic comfort measures Outcome: Adequate for Discharge   Problem: Health Behavior/Discharge Planning: Goal: Ability to manage health-related needs will improve Outcome: Adequate for Discharge   Problem: Clinical Measurements: Goal: Ability to maintain clinical measurements within normal limits will improve Outcome: Adequate for Discharge Goal: Will remain free from infection Outcome: Adequate for Discharge Goal: Diagnostic test results will improve Outcome: Adequate for Discharge Goal: Respiratory complications will improve Outcome: Adequate for Discharge Goal: Cardiovascular complication will be avoided Outcome: Adequate for Discharge   Problem: Activity: Goal: Risk for activity intolerance will decrease Outcome: Adequate for Discharge   Problem: Nutrition: Goal: Adequate nutrition will be maintained Outcome: Adequate for Discharge   Problem: Coping: Goal: Level of anxiety will decrease Outcome: Adequate for Discharge   Problem: Elimination: Goal: Will not experience complications related to bowel motility Outcome: Adequate for Discharge Goal: Will not experience complications related to urinary retention Outcome: Adequate for Discharge   Problem: Pain Managment: Goal: General experience of comfort will improve Outcome: Adequate for Discharge   Problem: Safety: Goal: Ability to remain free from injury will improve Outcome: Adequate for Discharge   Problem: Skin Integrity: Goal: Risk for impaired skin integrity will decrease Outcome: Adequate for Discharge   Problem: Education: Goal: Ability to demonstrate management of disease process will improve Outcome: Adequate for Discharge Goal: Ability to verbalize understanding of medication therapies will improve Outcome: Adequate for Discharge Goal:  Individualized Educational Video(s) Outcome: Adequate for Discharge   Problem: Activity: Goal: Capacity to carry out activities will improve Outcome: Adequate for Discharge   Problem: Cardiac: Goal: Ability to achieve and maintain adequate cardiopulmonary perfusion will improve Outcome: Adequate for Discharge   Problem: Education: Goal: Knowledge of risk factors and measures for prevention of condition will improve Outcome: Adequate for Discharge   Problem: Coping: Goal: Psychosocial and spiritual needs will be supported Outcome: Adequate for Discharge   Problem: Respiratory: Goal: Will maintain a patent airway Outcome: Adequate for Discharge Goal: Complications related to the disease process, condition or treatment will be avoided or minimized Outcome: Adequate for Discharge

## 2020-07-13 NOTE — Progress Notes (Signed)
Inpatient Diabetes Program Recommendations  AACE/ADA: New Consensus Statement on Inpatient Glycemic Control (2015)  Target Ranges:  Prepandial:   less than 140 mg/dL      Peak postprandial:   less than 180 mg/dL (1-2 hours)      Critically ill patients:  140 - 180 mg/dL   Lab Results  Component Value Date   GLUCAP 166 (H) 07/13/2020   HGBA1C 6.5 05/31/2020    Review of Glycemic Control Results for [name], [name] (MRN [id number]) as of 07/13/2020 09:56  Ref. Range 07/12/2020 07:39 07/12/2020 11:11 07/12/2020 16:17 07/12/2020 21:27 07/13/2020 07:39  Glucose-Capillary Latest Ref Range: 70 - 99 mg/dL 129 (H)  Novolog 2 units 286 (H)  Novolog 8 units 69 (L) 309 (H)  Novolog 4 units  Levemir 10 units 166 (H)   Diabetes history: DM 2 Outpatient Diabetes medications: Lantus 16 units, Glipizide 10 mg Daily, Janumet 50-1000 mg bid Current orders for Inpatient glycemic control:  Levemir 10 units Novolog 0-15 units tid + hs  Inpatient Diabetes Program Recommendations:    - Consider reducing Novolog Correction to "Sensitive" 0-9 units tid. - Consider Novolog 2-3 units tid meal coverage if eating at least 50% of meals.  Thanks,  Tama Headings RN, MSN, BC-ADM Inpatient Diabetes Coordinator Team Pager [phone] (8a-5p)

## 2020-07-13 NOTE — Discharge Summary (Signed)
Physician Discharge Summary  [name] IWL:[id number] DOB: Apr 29, 1936 DOA: 07/10/2020  PCP: Janora Norlander, DO  Admit date: 07/10/2020 Discharge date: 07/13/2020  Time spent: 35 minutes  Recommendations for Outpatient Follow-up:  Reassess blood pressure and adjust antihypertensive regimen as needed Repeat basic metabolic panel to follow electrolytes and renal function   Discharge Diagnoses:  Active Problems:   CHF (congestive heart failure) ([place])   COVID-19 virus infection   Hyperlipidemia   Insomnia Gastroesophageal reflux disease Hypertension; presented with hypertensive emergency Spinal lesion (incidentally found). Type 2 diabetes mellitus   Discharge Condition: Stable and improved.  Discharged home with instruction to follow-up with PCP in 10 days.  CODE STATUS: Full code.  Diet recommendation: Modified carbohydrates; heart healthy/low-sodium diet.  Filed Weights   07/10/20 1816 07/11/20 0151 07/11/20 2213  Weight: 59.4 kg 58 kg 57.3 kg    History of present illness:  [name] a85 y.o.female,patient with history of hypertension, glaucoma, GERD, diabetes mellitus type 2, and more presents the ED with a chief complaint of dyspnea. Patient reports that it started 3 days ago and has been progressively worse since it started. Minimal amount of walking causes her to be exquisitely short of breath, which is abnormal for her. She denies any cough she denies chest pain she denies palpitations. Patient reports that she has had no fevers or body aches. She reports that she checks her oxygen saturations at home and they have been around 93%. Patient reports that she takes her blood pressure medications once per day, and did not realize that some of them are written for 2 or 3 times a day. She denies any throbbing headaches, blurry vision, dizziness. She does report that her feet have been cramping while she has been in the ER. Patient is quite pleasant.  Covid  was almost an incidental finding on this patient-testing positive admission screening. She is not describing any infectious symptoms, and pulmonary edema was present on imaging, which would have adequately explainedthe dyspnea. Since she did test positive for Covid, starting her on Covid regimen. Inflammatory markers to be drawn in the morning.  In the ED Temp 98.2, heart rate 39-1 53, respiratory rate 18-28, blood pressure 166/72, satting at 92% at rest Saturations improved to mid 90s with 2 L nasal cannula Blood pressures at arrival were significantly high at 211/119 Leukocytosis at 11.1, hemoglobin 10.7 Chemistry panel reveals a potassium of 3.3 otherwise unremarkable BNP is 886 D-dimer was 1.5, CTA did not show pulmonary embolus Metoprolol, hydralazine, Lasix started in the ED CTA showed elevated right heart pressure, bibasilar volume loss, exaggerated thoracic kyphosis, findings consistent for pulmonary edema and a circumscribed 2.1 x 1.7 cm hypodense lesion at the T1-T2 costovertebral junction suspicious for nerve sheath tumor such as schwannoma Views were requested for CHF exacerbation secondary to hypertensive emergency  Hospital Course:  1-acute on chronic diastolic heart failure -Continue to follow daily weights and low-sodium diet. -Excellent response to IV diuresis. -Patient has been discharged home on adjusted dose of furosemide (40 mg by mouth daily). -Advised to follow-up with PCP in 10 days. -Patient reporting improvement in her breathing; still with mild orthopnea and short winded sensation with activity.  2-COVID-19 infection -Completely asymptomatic -No significant elevation in her inflammatory markers -No hypoxic and no meeting criteria for steroids at this time. -Completed 3 days of remdesivir given her risks factors. -Advised to continue following the 3 W's  3-hypertensive emergency -Control after the use of nitroglycerin drip -Continue metoprolol,  hydralazine, imdur, lisinopril  and Lasix. -Sodium diet encouraged -Blood pressure stable at discharge.  4-elevated D-dimer -CT angio has rule out pulmonary embolism  5-hypokalemia -Repleted and within normal limits at discharge. -Continue daily maintenance. -Continue to follow electrolytes trend with repeat basic metabolic panel follow-up visit.  6-type 2 diabetes mellitus -Recent A1c 6.5 -Continue modified carbohydrate diet and resume home hypoglycemic regimen.  7-Spinal lesion 1. 2.1 x 1.7 cm hypodense lesion at the left T1-T2 costovertebral junction, suspicious for nerve sheath tumor such as schwannoma. Possibly tortuous vascular structure is also considered but less likely. 2. Nonemergent follow-up will be needed 3. Consider MRI for more characterization   Procedures:  See below for x-ray reports.  Consultations:  None  Discharge Exam: Vitals:   07/13/20 0548 07/13/20 1300  BP: 140/79 135/72  Pulse: 68 75  Resp:  18  Temp: 97.8 F (36.6 C) 98.4 F (36.9 C)  SpO2: 91% 93%    General: Afebrile, no chest pain, no nausea, no vomiting.  Patient expressed no orthopnea, no oxygen requirement and feeling ready to go home. Cardiovascular: S1 and S2; no rubs, no gallops, no JVD on exam. Respiratory: Improved air movement bilaterally; no using accessory muscles.  Good oxygen saturation on room air. Abdomen: Soft, nontender, nondistended, positive bowel sounds Extremities: No cyanosis or clubbing.  Discharge Instructions   Discharge Instructions    (HEART FAILURE PATIENTS) Call MD:  Anytime you have any of the following symptoms: 1) 3 pound weight gain in 24 hours or 5 pounds in 1 week 2) shortness of breath, with or without a dry hacking cough 3) swelling in the hands, feet or stomach 4) if you have to sleep on extra pillows at night in order to breathe.   Complete by: As directed    Diet - low sodium heart healthy   Complete by: As directed    Diet Carb Modified    Complete by: As directed    Discharge instructions   Complete by: As directed    Take medications as prescribed Follow heart healthy/modified carbohydrate diet Limit your amount of sodium intake on daily basis to less than 2.5 g.   Arrange follow-up with PCP in 10 days  Continue to follow the 3 W's: Wear mask, Wait six feet apart and Wash hands frequently Maintain adequate hydration Check your weight on daily basis   Increase activity slowly   Complete by: As directed      Allergies as of 07/13/2020      Reactions   Codeine Nausea Only      Medication List    TAKE these medications   amLODipine 10 MG tablet Commonly known as: NORVASC Take 1 tablet (10 mg total) by mouth daily.   aspirin 81 MG tablet Take 81 mg by mouth daily.   atorvastatin 20 MG tablet Commonly known as: LIPITOR Take 0.5 tablets (10 mg total) by mouth daily.   calcium-vitamin D 500-200 MG-UNIT tablet Commonly known as: OSCAL WITH D Take 1 tablet by mouth.   Dexilant 60 MG capsule Generic drug: dexlansoprazole Take 1 capsule by mouth daily.   fluticasone 50 MCG/ACT nasal spray Commonly known as: FLONASE Place into both nostrils daily.   furosemide 40 MG tablet Commonly known as: LASIX Take 1 tablet (40 mg total) by mouth daily. What changed:   medication strength  See the new instructions.   glipiZIDE 10 MG tablet Commonly known as: GLUCOTROL TAKE 1 TABLET BY MOUTH ONCE DAILY BEFORE BREAKFAST   hydrALAZINE 25 MG tablet Commonly known  as: APRESOLINE Take 3 tablets (75 mg total) by mouth every 8 (eight) hours. What changed: when to take this   ibandronate 150 MG tablet Commonly known as: BONIVA Take 150 mg by mouth every 30 (thirty) days. Take in the morning with a full glass of water, on an empty stomach, and do not take anything else by mouth or lie down for the next 30 min.   isosorbide mononitrate 30 MG 24 hr tablet Commonly known as: IMDUR Take 1 tablet (30 mg total) by mouth  daily. Start taking on: July 14, 2020   Janumet 50-1000 MG tablet Generic drug: sitaGLIPtin-metformin TAKE 1 TABLET BY MOUTH TWICE DAILY WITH A MEAL   Lantus SoloStar 100 UNIT/ML Solostar Pen Generic drug: insulin glargine INJECT 16 UNITS SUBCUTANEOUSLY AT BEDTIME   latanoprost 0.005 % ophthalmic solution Commonly known as: XALATAN 1 drop at bedtime.   lisinopril 40 MG tablet Commonly known as: ZESTRIL Take 1 tablet (40 mg total) by mouth daily.   metoprolol tartrate 50 MG tablet Commonly known as: LOPRESSOR Take 1 tablet (50 mg total) by mouth 2 (two) times daily.   multivitamin-iron-minerals-folic acid chewable tablet Chew 1 tablet by mouth daily.   OneTouch Ultra test strip Generic drug: glucose blood Test BS twice daily Dx E11.9   potassium chloride 10 MEQ tablet Commonly known as: KLOR-CON Take 1 tablet (10 mEq total) by mouth daily.   timolol 0.5 % ophthalmic solution Commonly known as: TIMOPTIC Place 1 drop into both eyes daily.   traZODone 50 MG tablet Commonly known as: DESYREL Take 0.5-1 tablets (25-50 mg total) by mouth at bedtime as needed for sleep.      Allergies  Allergen Reactions  . Codeine Nausea Only    Follow-up Information    [name] M, DO. Schedule an appointment as soon as possible for a visit in 10 day(s).   Specialty: Family Medicine Contact information: [place] [address] [phone]        Josue Hector, MD .   Specialty: Cardiology Contact information: [phone] [address] [place] [address] [phone]                The results of significant diagnostics from this hospitalization (including imaging, microbiology, ancillary and laboratory) are listed below for reference.    Significant Diagnostic Studies: CT Angio Chest PE W/Cm &/Or Wo Cm  Result Date: 07/10/2020 CLINICAL DATA:  PE suspected, low/intermediate prob, positive D-dimer Shortness of breath on exertion.  EXAM: CT ANGIOGRAPHY CHEST WITH CONTRAST TECHNIQUE: Multidetector CT imaging of the chest was performed using the standard protocol during bolus administration of intravenous contrast. Multiplanar CT image reconstructions and MIPs were obtained to evaluate the vascular anatomy. CONTRAST:  172mL OMNIPAQUE IOHEXOL 350 MG/ML SOLN COMPARISON:  Radiograph earlier today. FINDINGS: Distortion of normal anatomy due to severe kyphosis. Cardiovascular: Motion artifact limits assessment, particularly of the upper lobes. Allowing for this, no evidence of pulmonary embolus. Mild cardiomegaly. There is right heart dilatation and reflux of contrast into the hepatic veins and IVC. Dense mitral annulus calcifications. There are coronary artery calcifications. No pericardial effusion. Atherosclerotic tortuous thoracic aorta without dissection or acute aortic finding. Mediastinum/Nodes: Scattered small mediastinal lymph nodes are not enlarged by size criteria. No hilar adenopathy. Left thyroid calcifications without dominant nodule. No esophageal wall thickening. Lungs/Pleura: Breathing motion limits assessment, particularly of the upper lungs. Bibasilar volume loss. Probable apical septal thickening with areas of ground-glass opacity and bronchial thickening. Favor pulmonary  edema. No confluent consolidation. Trace right pleural effusion. Upper Abdomen: Contrast refluxes into the hepatic veins and IVC. No other acute upper abdominal findings. Musculoskeletal: Exaggerated thoracic kyphosis. Severe T7 compression fracture with vertebral plana appearance. Mild T3 superior endplate compression fracture. Moderate T12 compression fracture. L1 compression fracture only partially included in the field of view. There is a well-circumscribed 2.1 x 1.7 cm hypodense lesion the left T1-T2 costovertebral junction, suspicious for nerve sheath tumor. No surrounding inflammation. A smaller but similar lesion is seen on the right T1-T2. Review of the  MIP images confirms the above findings. IMPRESSION: 1. No pulmonary embolus allowing for motion artifact. 2. Cardiomegaly with reflux of contrast into the hepatic veins and IVC consistent with elevated right heart pressures. Findings suspicious for pulmonary edema, though detailed assessment is limited by motion. Trace right pleural effusion. 3. Bibasilar volume loss. 4. Exaggerated thoracic kyphosis with multiple compression fractures, severe at T7. 5. Well-circumscribed 2.1 x 1.7 cm hypodense lesion at the left T1-T2 costovertebral junction, suspicious for nerve sheath tumor such as schwannoma. Possibility of tortuous vascular structure is also considered but felt less likely. A smaller but similar lesion is seen on the right at same level. Aortic Atherosclerosis (ICD10-I70.0). Electronically Signed   By: Keith Rake M.D.   On: 07/10/2020 21:53   DG Chest Port 1 View  Result Date: 07/10/2020 CLINICAL DATA:  Shortness of breath on exertion. EXAM: PORTABLE CHEST 1 VIEW COMPARISON:  06/02/2020 FINDINGS: Patient is rotated. Similar low lung volumes to prior. Heart size difficult to assess given positioning lung grossly stable. Slight increase in interstitial opacities from prior which may represent pulmonary edema. No confluent airspace disease or pleural effusion. Bones are subjectively under mineralized IMPRESSION: 1. Slight increase in interstitial opacities from prior which may represent pulmonary edema. 2. Low lung volumes limit assessment. Electronically Signed   By: Keith Rake M.D.   On: 07/10/2020 20:10   ECHOCARDIOGRAM COMPLETE  Result Date: 07/11/2020    ECHOCARDIOGRAM REPORT   Patient Name:   EMMANUELLA MIRANTE Date of Exam: 07/11/2020 Medical Rec #:  [id number]     Height:       61.0 in Accession #:    [id number]    Weight:       127.9 lb Date of Birth:  1935-06-01    BSA:          1.562 m Patient Age:    62 years      BP:           148/105 mmHg Patient Gender: F             HR:           78 bpm.  Exam Location:  [facility] Procedure: 2D Echo Indications:    Congestive Heart Failure I50.9  History:        Patient has prior history of Echocardiogram examinations, most                 recent 12/11/2019. CHF, Abnormal ECG; Risk Factors:Non-Smoker,                 Diabetes, Dyslipidemia and Hypertension. GERD.  Sonographer:    Leavy Cella RDCS (AE) Referring Phys: [id number] ASIA B [place]  1. Left ventricular ejection fraction, by estimation, is 70 to 75%. The left ventricle has hyperdynamic function. The left ventricle has no regional wall motion abnormalities. There is moderate left ventricular hypertrophy. Left ventricular diastolic parameters are indeterminate.  2. Right ventricular  systolic function is normal. The right ventricular size is normal. There is severely elevated pulmonary artery systolic pressure. The estimated right ventricular systolic pressure is 95.0 mmHg.  3. Left atrial size was moderately dilated.  4. The mitral valve is abnormal. Moderate mitral valve regurgitation. Severe mitral annular calcification.  5. The aortic valve is tricuspid. Aortic valve regurgitation is not visualized.  6. Pulmonic valve regurgitation is moderate.  7. The inferior vena cava is normal in size with <50% respiratory variability, suggesting right atrial pressure of 8 mmHg. FINDINGS  Left Ventricle: Left ventricular ejection fraction, by estimation, is 70 to 75%. The left ventricle has hyperdynamic function. The left ventricle has no regional wall motion abnormalities. The left ventricular internal cavity size was normal in size. There is moderate left ventricular hypertrophy. Left ventricular diastolic parameters are indeterminate. Right Ventricle: The right ventricular size is normal. No increase in right ventricular wall thickness. Right ventricular systolic function is normal. There is severely elevated pulmonary artery systolic pressure. The tricuspid regurgitant velocity is 4.42 m/s, and  with an assumed right atrial pressure of 8 mmHg, the estimated right ventricular systolic pressure is 93.2 mmHg. Left Atrium: Left atrial size was moderately dilated. Right Atrium: Right atrial size was normal in size. Pericardium: There is no evidence of pericardial effusion. Mitral Valve: The mitral valve is abnormal. Severe mitral annular calcification. Moderate mitral valve regurgitation. MV peak gradient, 6.8 mmHg. The mean mitral valve gradient is 2.0 mmHg. Tricuspid Valve: The tricuspid valve is grossly normal. Tricuspid valve regurgitation is mild. Aortic Valve: The aortic valve is tricuspid. There is mild to moderate aortic valve annular calcification. Aortic valve regurgitation is not visualized. Aortic valve mean gradient measures 3.0 mmHg. Aortic valve peak gradient measures 5.9 mmHg. Aortic valve area, by VTI measures 1.75 cm. Pulmonic Valve: The pulmonic valve was thickened with good excursion. Pulmonic valve regurgitation is moderate. Aorta: The aortic root is normal in size and structure. Venous: The inferior vena cava is normal in size with less than 50% respiratory variability, suggesting right atrial pressure of 8 mmHg. IAS/Shunts: No atrial level shunt detected by color flow Doppler.  LEFT VENTRICLE PLAX 2D LVIDd:         3.53 cm  Diastology LVIDs:         1.84 cm  LV e' medial:    5.44 cm/s LV PW:         1.50 cm  LV E/e' medial:  22.1 LV IVS:        1.25 cm  LV e' lateral:   7.51 cm/s LVOT diam:     1.70 cm  LV E/e' lateral: 16.0 LV SV:         52 LV SV Index:   33 LVOT Area:     2.27 cm  RIGHT VENTRICLE RV S prime:     11.90 cm/s LEFT ATRIUM             Index       RIGHT ATRIUM           Index LA diam:        4.70 cm 3.01 cm/m  RA Area:     10.90 cm LA Vol (A2C):   74.9 ml 47.96 ml/m RA Volume:   26.10 ml  16.71 ml/m LA Vol (A4C):   79.8 ml 51.10 ml/m LA Biplane Vol: 78.2 ml 50.07 ml/m  AORTIC VALVE AV Area (Vmax):    1.97 cm AV Area (Vmean):   1.84 cm AV Area (  VTI):     1.75 cm AV  Vmax:           121.00 cm/s AV Vmean:          82.500 cm/s AV VTI:            0.296 m AV Peak Grad:      5.9 mmHg AV Mean Grad:      3.0 mmHg LVOT Vmax:         105.00 cm/s LVOT Vmean:        66.700 cm/s LVOT VTI:          0.228 m LVOT/AV VTI ratio: 0.77  AORTA Ao Root diam: 2.80 cm MITRAL VALVE                TRICUSPID VALVE MV Area (PHT): 4.68 cm     TR Peak grad:   78.1 mmHg MV Area VTI:   1.34 cm     TR Vmax:        442.00 cm/s MV Peak grad:  6.8 mmHg MV Mean grad:  2.0 mmHg     SHUNTS MV Vmax:       1.30 m/s     Systemic VTI:  0.23 m MV Vmean:      57.1 cm/s    Systemic Diam: 1.70 cm MV Decel Time: 162 msec MV E velocity: 120.00 cm/s MV A velocity: 82.60 cm/s MV E/A ratio:  1.45 Rozann Lesches MD Electronically signed by Rozann Lesches MD Signature Date/Time: 07/11/2020/10:40:28 AM    Final     Microbiology: Recent Results (from the past 240 hour(s))  Resp Panel by RT-PCR (Flu A&B, Covid) Nasopharyngeal Swab     Status: Abnormal   Collection Time: 07/10/20 11:10 PM   Specimen: Nasopharyngeal Swab; Nasopharyngeal(NP) swabs in vial transport medium  Result Value Ref Range Status   SARS Coronavirus 2 by RT PCR POSITIVE (A) NEGATIVE Final    Comment: RESULT CALLED TO, READ BACK BY AND VERIFIED WITH: SAPPELT,J @ 0057 ON 07/11/20 BY JUW (NOTE) SARS-CoV-2 target nucleic acids are DETECTED.  The SARS-CoV-2 RNA is generally detectable in upper respiratory specimens during the acute phase of infection. Positive results are indicative of the presence of the identified virus, but do not rule out bacterial infection or co-infection with other pathogens not detected by the test. Clinical correlation with patient history and other diagnostic information is necessary to determine patient infection status. The expected result is Negative.  Fact Sheet for Patients: EntrepreneurPulse.com.au  Fact Sheet for Healthcare Providers: IncredibleEmployment.be  This test is  not yet approved or cleared by the [place] FDA and  has been authorized for detection and/or diagnosis of SARS-CoV-2 by FDA under an Emergency Use Authorization (EUA).  This EUA will remain in effect (meaning this test can be  used) for the duration of  the COVID-19 declaration under Section 564(b)(1) of the Act, 21 U.S.C. section 360bbb-3(b)(1), unless the authorization is terminated or revoked sooner.     Influenza A by PCR NEGATIVE NEGATIVE Final   Influenza B by PCR NEGATIVE NEGATIVE Final    Comment: (NOTE) The Xpert Xpress SARS-CoV-2/FLU/RSV plus assay is intended as an aid in the diagnosis of influenza from Nasopharyngeal swab specimens and should not be used as a sole basis for treatment. Nasal washings and aspirates are unacceptable for Xpert Xpress SARS-CoV-2/FLU/RSV testing.  Fact Sheet for Patients: EntrepreneurPulse.com.au  Fact Sheet for Healthcare Providers: IncredibleEmployment.be  This test is not yet approved or cleared by the [place]  FDA and has been authorized for detection and/or diagnosis of SARS-CoV-2 by FDA under an Emergency Use Authorization (EUA). This EUA will remain in effect (meaning this test can be used) for the duration of the COVID-19 declaration under Section 564(b)(1) of the Act, 21 U.S.C. section 360bbb-3(b)(1), unless the authorization is terminated or revoked.  Performed at [facility], [address] [place], [place] [address]   MRSA PCR Screening     Status: None   Collection Time: 07/11/20  1:37 AM   Specimen: Nasopharyngeal  Result Value Ref Range Status   MRSA by PCR NEGATIVE NEGATIVE Final    Comment:        The GeneXpert MRSA Assay (FDA approved for NASAL specimens only), is one component of a comprehensive MRSA colonization surveillance program. It is not intended to diagnose MRSA infection nor to guide or monitor treatment for MRSA infections. Performed at [facility], [address] [place], [place] [address]      Labs: Basic Metabolic Panel: Recent Labs  Lab 07/10/20 1940 07/11/20 0418  NA 137 137  K 3.3* 3.8  CL 97* 95*  CO2 26 27  GLUCOSE 154* 156*  BUN 21 19  CREATININE 0.79 0.73  CALCIUM 8.4* 8.0*  MG  --  1.1*   Liver Function Tests: Recent Labs  Lab 07/10/20 1940 07/11/20 0418  AST 33 35  ALT 30 36  ALKPHOS 90 82  BILITOT 0.9 0.9  PROT 7.3 6.7  ALBUMIN 3.5 3.1*   CBC: Recent Labs  Lab 07/10/20 1940 07/11/20 0418  WBC 11.1* 10.4  NEUTROABS 8.4*  --   HGB 10.7* 10.2*  HCT 35.6* 32.6*  MCV 87.7 85.8  PLT 376 385   BNP (last 3 results) Recent Labs    06/02/20 1109 06/18/20 0936 07/10/20 1940  BNP 833.8* 346.0* 886.0*    CBG: Recent Labs  Lab 07/12/20 1111 07/12/20 1617 07/12/20 2127 07/13/20 0739 07/13/20 1148  GLUCAP 286* 69* 309* 166* 263*    Signed:  Barton Dubois MD.  [facility] 07/13/2020, 1:35 PM

## 2020-07-13 NOTE — Telephone Encounter (Signed)
Contact Date: 07/13/2020 Contacted By: Pricilla Riffle, LPN  Transition Care Management Follow-up Telephone Call  Date of discharge and from where: 07/13/20, [facility]  Discharge Diagnosis:SOB,CHF  How have you been since you were released from the hospital? Better  Any questions or concerns? No   Items Reviewed:  Did the pt receive and understand the discharge instructions provided? Yes   Medications obtained and verified? Yes   Any new allergies since your discharge? No   Dietary orders reviewed? Yes  Do you have support at home? Yes   Discontinued Medications None New Medications Added Furosemide, Isosorbide  Current Medication List Allergies as of 07/13/2020      Reactions   Codeine Nausea Only      Medication List       Accurate as of July 13, 2020  3:46 PM. If you have any questions, ask your nurse or doctor.        amLODipine 10 MG tablet Commonly known as: NORVASC Take 1 tablet (10 mg total) by mouth daily.   aspirin 81 MG tablet Take 81 mg by mouth daily.   atorvastatin 20 MG tablet Commonly known as: LIPITOR Take 0.5 tablets (10 mg total) by mouth daily.   calcium-vitamin D 500-200 MG-UNIT tablet Commonly known as: OSCAL WITH D Take 1 tablet by mouth.   Dexilant 60 MG capsule Generic drug: dexlansoprazole Take 1 capsule by mouth daily.   fluticasone 50 MCG/ACT nasal spray Commonly known as: FLONASE Place into both nostrils daily.   furosemide 40 MG tablet Commonly known as: LASIX Take 1 tablet (40 mg total) by mouth daily.   glipiZIDE 10 MG tablet Commonly known as: GLUCOTROL TAKE 1 TABLET BY MOUTH ONCE DAILY BEFORE BREAKFAST   hydrALAZINE 25 MG tablet Commonly known as: APRESOLINE Take 3 tablets (75 mg total) by mouth every 8 (eight) hours. What changed: when to take this   ibandronate 150 MG tablet Commonly known as: BONIVA Take 150 mg by mouth every 30 (thirty) days. Take in the morning with a full glass of water, on an empty  stomach, and do not take anything else by mouth or lie down for the next 30 min.   isosorbide mononitrate 30 MG 24 hr tablet Commonly known as: IMDUR Take 1 tablet (30 mg total) by mouth daily. Start taking on: July 14, 2020   Janumet 50-1000 MG tablet Generic drug: sitaGLIPtin-metformin TAKE 1 TABLET BY MOUTH TWICE DAILY WITH A MEAL   Lantus SoloStar 100 UNIT/ML Solostar Pen Generic drug: insulin glargine INJECT 16 UNITS SUBCUTANEOUSLY AT BEDTIME   latanoprost 0.005 % ophthalmic solution Commonly known as: XALATAN 1 drop at bedtime.   lisinopril 40 MG tablet Commonly known as: ZESTRIL Take 1 tablet (40 mg total) by mouth daily.   metoprolol tartrate 50 MG tablet Commonly known as: LOPRESSOR Take 1 tablet (50 mg total) by mouth 2 (two) times daily.   multivitamin-iron-minerals-folic acid chewable tablet Chew 1 tablet by mouth daily.   OneTouch Ultra test strip Generic drug: glucose blood Test BS twice daily Dx E11.9   potassium chloride 10 MEQ tablet Commonly known as: KLOR-CON Take 1 tablet (10 mEq total) by mouth daily.   timolol 0.5 % ophthalmic solution Commonly known as: TIMOPTIC Place 1 drop into both eyes daily.   traZODone 50 MG tablet Commonly known as: DESYREL Take 0.5-1 tablets (25-50 mg total) by mouth at bedtime as needed for sleep.        Home Care and Equipment/Supplies: Were home health  services ordered? no If so, what is the name of the agency? N/A  Has the agency set up a time to come to the patient's home? not applicable Were any new equipment or medical supplies ordered?  No What is the name of the medical supply agency? N/A Were you able to get the supplies/equipment? not applicable Do you have any questions related to the use of the equipment or supplies? No  Functional Questionnaire: (I = Independent and D = Dependent) ADLs: I     Bathing/Dressing- I  Meal Prep- I  Eating- I  Maintaining continence-  I  Transferring/Ambulation- I  Managing Meds- I  Follow up appointments reviewed:   PCP Hospital f/u appt confirmed? Yes  Scheduled to see Gott[facility] on March 18th @ 10:30am.  [facility] f/u appt confirmed? No    Are transportation arrangements needed? No   If their condition worsens, is the pt aware to call PCP or go to the Emergency Dept.? Yes  Was the patient provided with contact information for the PCP's office or ED? Yes  Was to pt encouraged to call back with questions or concerns? Yes

## 2020-07-19 ENCOUNTER — Ambulatory Visit (INDEPENDENT_AMBULATORY_CARE_PROVIDER_SITE_OTHER): Payer: Medicare Other | Admitting: Family Medicine

## 2020-07-19 ENCOUNTER — Other Ambulatory Visit: Payer: Self-pay | Admitting: Family Medicine

## 2020-07-19 DIAGNOSIS — E1129 Type 2 diabetes mellitus with other diabetic kidney complication: Secondary | ICD-10-CM

## 2020-07-19 DIAGNOSIS — R809 Proteinuria, unspecified: Secondary | ICD-10-CM

## 2020-07-19 DIAGNOSIS — M545 Low back pain, unspecified: Secondary | ICD-10-CM

## 2020-07-19 DIAGNOSIS — G8929 Other chronic pain: Secondary | ICD-10-CM

## 2020-07-19 DIAGNOSIS — E1159 Type 2 diabetes mellitus with other circulatory complications: Secondary | ICD-10-CM

## 2020-07-19 MED ORDER — LUMBAR BACK BRACE/SUPPORT PAD MISC: 0 refills | Status: AC

## 2020-07-19 NOTE — Progress Notes (Signed)
Telephone visit  Subjective: CC: back brace PCP: Janora Norlander, DO NFA:OZHY C Hackbart is a 85 y.o. female calls for telephone consult today. Patient provides verbal consent for consult held via phone.  Due to COVID-19 pandemic this visit was conducted virtually. This visit type was conducted due to national recommendations for restrictions regarding the COVID-19 Pandemic (e.g. social distancing, sheltering in place) in an effort to limit this patient's exposure and mitigate transmission in our community. All issues noted in this document were discussed and addressed.  A physical exam was not performed with this format.   Location of patient: family member's home Location of provider: [facility] Others present for call: none  1.  Chronic low back pain Patient reports chronic low back pain.  She is asking for a back brace to help with this.  She reports feeling generalized weakness since being discharged from the hospital.  She has an appoint with me on Friday for in person reevaluation, labs, etc.  She denies any falls.   ROS: Per HPI  Allergies  Allergen Reactions  . Codeine Nausea Only   Past Medical History:  Diagnosis Date  . Arthritis    OA  . DDD (degenerative disc disease), lumbosacral   . Diabetes mellitus without complication ([place])   . Distal radius fracture    bilateral  . GERD (gastroesophageal reflux disease)   . Glaucoma   . HOH (hard of hearing)    left ear  . Hypertension   . PONV (postoperative nausea and vomiting)     Current Outpatient Medications:  .  Elastic Bandages & Supports (LUMBAR BACK BRACE/SUPPORT PAD) MISC, Velcro closing back brace.  Use as needed for back pain.  Use no more than 1 time per week., Disp: 1 each, Rfl: 0 .  amLODipine (NORVASC) 10 MG tablet, Take 1 tablet (10 mg total) by mouth daily., Disp: 90 tablet, Rfl: 3 .  aspirin 81 MG tablet, Take 81 mg by mouth daily., Disp: , Rfl:  .  atorvastatin (LIPITOR) 20 MG tablet, Take 0.5 tablets  (10 mg total) by mouth daily. (Patient not taking: No sig reported), Disp: 90 tablet, Rfl: 3 .  calcium-vitamin D (OSCAL WITH D) 500-200 MG-UNIT tablet, Take 1 tablet by mouth., Disp: , Rfl:  .  DEXILANT 60 MG capsule, Take 1 capsule by mouth daily., Disp: , Rfl:  .  fluticasone (FLONASE) 50 MCG/ACT nasal spray, Place into both nostrils daily., Disp: , Rfl:  .  furosemide (LASIX) 40 MG tablet, Take 1 tablet (40 mg total) by mouth daily., Disp: 30 tablet, Rfl: 3 .  glipiZIDE (GLUCOTROL) 10 MG tablet, TAKE 1 TABLET BY MOUTH ONCE DAILY BEFORE BREAKFAST, Disp: 90 tablet, Rfl: 3 .  glucose blood (ONETOUCH ULTRA) test strip, Test BS twice daily Dx E11.9, Disp: 200 each, Rfl: 3 .  hydrALAZINE (APRESOLINE) 25 MG tablet, Take 3 tablets (75 mg total) by mouth every 8 (eight) hours. (Patient taking differently: Take 75 mg by mouth daily.), Disp: 90 tablet, Rfl: 1 .  ibandronate (BONIVA) 150 MG tablet, Take 150 mg by mouth every 30 (thirty) days. Take in the morning with a full glass of water, on an empty stomach, and do not take anything else by mouth or lie down for the next 30 min. (Patient not taking: Reported on 07/10/2020), Disp: , Rfl:  .  isosorbide mononitrate (IMDUR) 30 MG 24 hr tablet, Take 1 tablet (30 mg total) by mouth daily., Disp: 30 tablet, Rfl: 2 .  LANTUS SOLOSTAR  100 UNIT/ML Solostar Pen, INJECT 16 UNITS SUBCUTANEOUSLY AT BEDTIME, Disp: 15 mL, Rfl: 0 .  latanoprost (XALATAN) 0.005 % ophthalmic solution, 1 drop at bedtime., Disp: , Rfl:  .  lisinopril (ZESTRIL) 40 MG tablet, Take 1 tablet (40 mg total) by mouth daily., Disp: 90 tablet, Rfl: 1 .  metoprolol tartrate (LOPRESSOR) 50 MG tablet, Take 1 tablet (50 mg total) by mouth 2 (two) times daily., Disp: 180 tablet, Rfl: 3 .  multivitamin-iron-minerals-folic acid (CENTRUM) chewable tablet, Chew 1 tablet by mouth daily., Disp: , Rfl:  .  potassium chloride (KLOR-CON) 10 MEQ tablet, Take 1 tablet (10 mEq total) by mouth daily., Disp: 30 tablet,  Rfl: 1 .  sitaGLIPtin-metformin (JANUMET) 50-1000 MG tablet, TAKE 1 TABLET BY MOUTH TWICE DAILY WITH A MEAL, Disp: 180 tablet, Rfl: 3 .  timolol (TIMOPTIC) 0.5 % ophthalmic solution, Place 1 drop into both eyes daily., Disp: , Rfl:  .  traZODone (DESYREL) 50 MG tablet, Take 0.5-1 tablets (25-50 mg total) by mouth at bedtime as needed for sleep., Disp: 30 tablet, Rfl: 3  Assessment/ Plan: 85 y.o. female   Chronic bilateral low back pain without sciatica - Plan: Elastic Bandages & Supports (LUMBAR BACK BRACE/SUPPORT PAD) MISC  I do think that she likely needs home health physical therapy but given limitations of telephone visit we will arrange this during our face-to-face visit on Friday.  We discussed the risk of use of back braces and increased weakness and subsequently increased back pain.  She is uses no more than once per week.  If she is finding more than 1 day/week needed then I would like her to see the specialist.  She voiced good understanding and will follow up on Friday as scheduled  Start time: 11:34am End time: 11;43am  Total time spent on patient care (including telephone call/ virtual visit): 9 minutes  [place], [place] [phone]

## 2020-07-23 ENCOUNTER — Ambulatory Visit (INDEPENDENT_AMBULATORY_CARE_PROVIDER_SITE_OTHER): Payer: Medicare Other | Admitting: Family Medicine

## 2020-07-23 ENCOUNTER — Other Ambulatory Visit: Payer: Self-pay

## 2020-07-23 ENCOUNTER — Encounter: Payer: Self-pay | Admitting: Family Medicine

## 2020-07-23 ENCOUNTER — Telehealth: Payer: Self-pay

## 2020-07-23 VITALS — BP 171/70 | HR 73 | Temp 97.1°F | Ht 61.0 in | Wt 123.0 lb

## 2020-07-23 DIAGNOSIS — R350 Frequency of micturition: Secondary | ICD-10-CM | POA: Diagnosis not present

## 2020-07-23 DIAGNOSIS — R937 Abnormal findings on diagnostic imaging of other parts of musculoskeletal system: Secondary | ICD-10-CM | POA: Diagnosis not present

## 2020-07-23 DIAGNOSIS — Z09 Encounter for follow-up examination after completed treatment for conditions other than malignant neoplasm: Secondary | ICD-10-CM | POA: Diagnosis not present

## 2020-07-23 DIAGNOSIS — Z8616 Personal history of COVID-19: Secondary | ICD-10-CM | POA: Diagnosis not present

## 2020-07-23 DIAGNOSIS — I872 Venous insufficiency (chronic) (peripheral): Secondary | ICD-10-CM

## 2020-07-23 DIAGNOSIS — I5032 Chronic diastolic (congestive) heart failure: Secondary | ICD-10-CM | POA: Diagnosis not present

## 2020-07-23 DIAGNOSIS — R0602 Shortness of breath: Secondary | ICD-10-CM | POA: Diagnosis not present

## 2020-07-23 NOTE — Telephone Encounter (Signed)
I spoke to the pharmacist and they needed to know the quantity of the Eucerin/Cerave cream Dr Darnell Level wanted on the handwritten rx. Advised to give pt 454 grams since she is using on both legs per Dr Darnell Level.

## 2020-07-23 NOTE — Telephone Encounter (Signed)
[facility] calling had question about compound rx that was sent over with no quantity information. I did not see in med list. Please call back

## 2020-07-23 NOTE — Patient Instructions (Signed)
There was a spot on your spine seen on CT in the hospital.  This looked like a schwanomma.  We talked about getting MRI to look at it more closely but you declined this.  Benign Peripheral Nerve Tumor  A benign peripheral nerve tumor is an abnormal growth of cells. The growth forms in or near the peripheral nerves, which connect the brain and spinal cord to the arms, hands, legs, and feet. This type of tumor is not cancerous (malignant). However, it can lead to damage of the nerves or loss of muscle control depending on the location of the affected nerves and muscles. There are several types of benign peripheral nerve tumors. They include:  Schwannoma (most common). This type can appear in any part of the body.  Neurofibroma. This type can appear in any part of the body. It may be associated with a genetic condition.  Acoustic neuroma. This type forms on the nerve that connects the brain to the inner ear.  Perineurioma (rare). This type can form anywhere in the body.  Ganglioneuroma (rare). This type forms in nerve cells in the chest or abdomen.  Granular cell tumor (rare). This type usually develops under the skin in areas where an injury (trauma) has occurred. Some tumors can become malignant over time. What are the causes? In most cases, the cause of this condition is not known. In some cases, the condition is caused by a genetic disease. What increases the risk? This condition is more likely to develop in people who have a family history of nerve tumors or genetic conditions, such as neurofibromatosis type 1 and 2 and schwannomatosis. What are the signs or symptoms? Common symptoms of this condition include:  Pain.  Numbness or tingling.  Weakness.  Dizziness.  Hearing loss.  Loss of balance.  A growth under the skin. How is this diagnosed? This condition may be diagnosed with:  A physical exam.  Imaging tests, such as a CT scan or an MRI.  An electromyogram (EMG).  This is a test to check the electrical activity of your muscles.  A nerve conduction study. This test should be done along with the EMG to test how quickly your nerves send electrical signals to your muscles.  A nerve or tumor biopsy. This is a procedure to remove a small tissue sample to be looked at under a microscope. How is this treated? This condition may be treated by:  Monitoring the tumor.  Removing the tumor through surgery.  Taking medicine to help with pain. Follow these instructions at home:  Take over-the-counter and prescription medicines only as told by your health care provider.  Ask your health care provider if the medicine prescribed to you: ? Requires you to avoid driving or using machinery. ? Can cause constipation. You may need to take these actions to prevent or treat constipation:  Drink enough fluid to keep your urine pale yellow.  Take over-the-counter or prescription medicines.  Eat foods that are high in fiber, such as beans, whole grains, and fresh fruits and vegetables.  Limit foods that are high in fat and processed sugars, such as fried or sweet foods.  Keep all follow-up visits as told by your health care provider. This is important. Contact a health care provider if:  Your pain medicine is not working.  Your symptoms get worse.  The growth seems to be getting larger. Get help right away if:  You notice a growth that is quickly getting larger or that changes in  color.  You have a growth that bleeds or does not heal.  You develop new weakness or numbness in your face, arm, or leg. Summary  A benign peripheral nerve tumor is an abnormal growth of cells. The growth forms in or near the peripheral nerves, which connect the brain and spinal cord to the arms, hands, legs, and feet.  There are several types of benign peripheral nerve tumors.  In most cases, the cause of this condition is not known. In some cases, the condition is caused by a  genetic disease.  Treatment may include monitoring the tumor, surgery to remove the tumor, and medicines to help with pain. This information is not intended to replace advice given to you by your health care provider. Make sure you discuss any questions you have with your health care provider. Document Revised: 04/18/2019 Document Reviewed: 04/18/2019 Elsevier Patient Education  2021 [employer].

## 2020-07-23 NOTE — Progress Notes (Signed)
Subjective: CC: Hospital follow up PCP: Janora Norlander, DO [name] is a 85 y.o. female presenting to clinic today for:  1. CHF exacerbation Patient was seen in the emergency department after a couple of days of dyspnea.  She was found to be in fluid overload with BNP greater than 800.  She was IV diuresed, to which she responded well to.  Incidentally she was also found to be COVID-19 positive and was treated with the appropriate medications in hospital.  She did have an elevated D-dimer and CTA was obtained.  No evidence of PE but there was a questionable schwannoma in the thoracic vertebra.  She was discharged with oral Lasix.  She has follow-up with cardiology in May.   She does that she is breathing much better since discharge from the hospital but still has some dyspnea on exertion and easy fatigability.  She is otherwise been asymptomatic from a COVID-19 standpoint.  She reports some fatigue and stasis dermatitis in the lower extremities.  She is asking for assistance with the dermatitis as this is her main concern today.  Patient reports compliance with Lasix twice daily.  She reports good urine output in fact has nocturia at nighttime.  No falls.  She is typically able to make it to the bathroom.   ROS: Per HPI  Allergies  Allergen Reactions  . Codeine Nausea Only   Past Medical History:  Diagnosis Date  . Arthritis    OA  . DDD (degenerative disc disease), lumbosacral   . Diabetes mellitus without complication ([place])   . Distal radius fracture    bilateral  . GERD (gastroesophageal reflux disease)   . Glaucoma   . HOH (hard of hearing)    left ear  . Hypertension   . PONV (postoperative nausea and vomiting)     Current Outpatient Medications:  .  amLODipine (NORVASC) 10 MG tablet, Take 1 tablet (10 mg total) by mouth daily., Disp: 90 tablet, Rfl: 3 .  aspirin 81 MG tablet, Take 81 mg by mouth daily., Disp: , Rfl:  .  atorvastatin (LIPITOR) 20 MG  tablet, Take 0.5 tablets (10 mg total) by mouth daily. (Patient not taking: No sig reported), Disp: 90 tablet, Rfl: 3 .  calcium-vitamin D (OSCAL WITH D) 500-200 MG-UNIT tablet, Take 1 tablet by mouth., Disp: , Rfl:  .  DEXILANT 60 MG capsule, Take 1 capsule by mouth daily., Disp: , Rfl:  .  Elastic Bandages & Supports (LUMBAR BACK BRACE/SUPPORT PAD) MISC, Velcro closing back brace.  Use as needed for back pain.  Use no more than 1 time per week., Disp: 1 each, Rfl: 0 .  fluticasone (FLONASE) 50 MCG/ACT nasal spray, Place into both nostrils daily., Disp: , Rfl:  .  furosemide (LASIX) 40 MG tablet, Take 1 tablet (40 mg total) by mouth daily., Disp: 30 tablet, Rfl: 3 .  glipiZIDE (GLUCOTROL) 10 MG tablet, TAKE 1 TABLET BY MOUTH ONCE DAILY BEFORE BREAKFAST, Disp: 90 tablet, Rfl: 3 .  glucose blood (ONETOUCH ULTRA) test strip, Test BS twice daily Dx E11.9, Disp: 200 each, Rfl: 3 .  hydrALAZINE (APRESOLINE) 25 MG tablet, Take 3 tablets (75 mg total) by mouth every 8 (eight) hours. (Patient taking differently: Take 75 mg by mouth daily.), Disp: 90 tablet, Rfl: 1 .  ibandronate (BONIVA) 150 MG tablet, Take 150 mg by mouth every 30 (thirty) days. Take in the morning with a full glass of water, on an empty stomach, and do not  take anything else by mouth or lie down for the next 30 min. (Patient not taking: Reported on 07/10/2020), Disp: , Rfl:  .  isosorbide mononitrate (IMDUR) 30 MG 24 hr tablet, Take 1 tablet (30 mg total) by mouth daily., Disp: 30 tablet, Rfl: 2 .  LANTUS SOLOSTAR 100 UNIT/ML Solostar Pen, INJECT 16 UNITS SUBCUTANEOUSLY AT BEDTIME, Disp: 15 mL, Rfl: 0 .  latanoprost (XALATAN) 0.005 % ophthalmic solution, 1 drop at bedtime., Disp: , Rfl:  .  lisinopril (ZESTRIL) 40 MG tablet, Take 1 tablet by mouth once daily, Disp: 90 tablet, Rfl: 0 .  metoprolol tartrate (LOPRESSOR) 50 MG tablet, Take 1 tablet (50 mg total) by mouth 2 (two) times daily., Disp: 180 tablet, Rfl: 3 .   multivitamin-iron-minerals-folic acid (CENTRUM) chewable tablet, Chew 1 tablet by mouth daily., Disp: , Rfl:  .  potassium chloride (KLOR-CON) 10 MEQ tablet, Take 1 tablet (10 mEq total) by mouth daily., Disp: 30 tablet, Rfl: 1 .  sitaGLIPtin-metformin (JANUMET) 50-1000 MG tablet, TAKE 1 TABLET BY MOUTH TWICE DAILY WITH A MEAL, Disp: 180 tablet, Rfl: 3 .  timolol (TIMOPTIC) 0.5 % ophthalmic solution, Place 1 drop into both eyes daily., Disp: , Rfl:  .  traZODone (DESYREL) 50 MG tablet, Take 0.5-1 tablets (25-50 mg total) by mouth at bedtime as needed for sleep., Disp: 30 tablet, Rfl: 3 Social History   Socioeconomic History  . Marital status: Widowed    Spouse name: Not on file  . Number of children: 0  . Years of education: 7  . Highest education level: 7th grade  Occupational History  . Not on file  Tobacco Use  . Smoking status: Never Smoker  . Smokeless tobacco: Never Used  Vaping Use  . Vaping Use: Never used  Substance and Sexual Activity  . Alcohol use: No  . Drug use: No  . Sexual activity: Not Currently  Other Topics Concern  . Not on file  Social History Narrative  . Not on file   Social Determinants of Health   Financial Resource Strain: Not on file  Food Insecurity: Not on file  Transportation Needs: Not on file  Physical Activity: Not on file  Stress: Not on file  Social Connections: Not on file  Intimate Partner Violence: Not on file   Family History  Problem Relation Age of Onset  . Heart disease Mother   . Heart disease Father     Objective: Office vital signs reviewed. BP (!) 171/70   Pulse 73   Temp (!) 97.1 F (36.2 C) (Temporal)   Ht 5\' 1"  (1.549 m)   Wt 123 lb (55.8 kg)   SpO2 100%   BMI 23.24 kg/m   Physical Examination:  General: Awake, alert, No acute distress HEENT: Normal, sclera white Cardio: regular rate and rhythm, S1S2 heard, no murmurs appreciated Pulm: clear to auscultation bilaterally, no wheezes, rhonchi or rales; normal  work of breathing on room air Extremities: warm, well perfused, 1+ pitting edema mid shins, No cyanosis or clubbing; +2 pulses bilaterally MSK: slow gait and hunched station Skin: Dry, scaly in the lower extremities.  No significant warmth.  No bruising.  She has venous stasis changes noted bilaterally  Assessment/ Plan: 85 y.o. female   Hospital discharge follow-up  Chronic diastolic heart failure ([place]) - Plan: For home use only DME Bedside commode  Personal history of COVID-19  Shortness of breath - Plan: Basic Metabolic Panel, CBC with Differential, For home use only DME Bedside commode  Abnormal  CT of thoracic spine  Venous stasis dermatitis of both lower extremities  Urinary frequency - Plan: For home use only DME Bedside commode  She has trace to +1 pitting edema noted in the lower extremities.  She is to continue her diuretic as prescribed.  Blood pressure is not at goal today.  She seems to have a very labile blood pressure.  I would like her to follow-up with cardiology sooner than May.  I will CC the note to them as well today.  At last visit with Dr. Harl Bowie in February hydrochlorothiazide was discontinued due to increased Lasix but I wonder if patient would not benefit from the dual therapy given uncontrolled blood pressure.  Continues to have some fatigue which I suspect is due to recent illness and COVID-19 infection history  Her lungs were clear to auscultation bilaterally today.  Recheck BMP, CBC.  Bedside commode ordered given urinary frequency  I have given her written prescription for triamcinolone 0.1% mixed with CeraVe cream and a 1:2 ratio.  She may apply to the affected areas twice daily as needed    No orders of the defined types were placed in this encounter.  No orders of the defined types were placed in this encounter.   Today's visit is for Transitional Care Management.  The patient was discharged from [facility] on 07/13/20 with a primary diagnosis of  CHF.   Contact with the patient and/or caregiver, by a clinical staff member, was made on 07/13/20 and was documented as a telephone encounter within the EMR.  Through chart review and discussion with the patient I have determined that management of their condition is of moderate complexity.    Janora Norlander, DO [place] [phone]

## 2020-07-29 ENCOUNTER — Ambulatory Visit: Payer: Medicare Other

## 2020-08-13 ENCOUNTER — Encounter (HOSPITAL_COMMUNITY): Payer: Self-pay | Admitting: *Deleted

## 2020-08-13 ENCOUNTER — Emergency Department (HOSPITAL_COMMUNITY): Payer: Medicare Other

## 2020-08-13 ENCOUNTER — Other Ambulatory Visit: Payer: Self-pay

## 2020-08-13 ENCOUNTER — Inpatient Hospital Stay (HOSPITAL_COMMUNITY)
Admission: EM | Admit: 2020-08-13 | Discharge: 2020-08-16 | DRG: 291 | Disposition: A | Discharge status: Home / Self Care | Payer: Medicare Other | Attending: Internal Medicine | Admitting: Internal Medicine

## 2020-08-13 DIAGNOSIS — I509 Heart failure, unspecified: Secondary | ICD-10-CM | POA: Diagnosis not present

## 2020-08-13 DIAGNOSIS — M5137 Other intervertebral disc degeneration, lumbosacral region: Secondary | ICD-10-CM | POA: Diagnosis not present

## 2020-08-13 DIAGNOSIS — I110 Hypertensive heart disease with heart failure: Secondary | ICD-10-CM | POA: Diagnosis not present

## 2020-08-13 DIAGNOSIS — K219 Gastro-esophageal reflux disease without esophagitis: Secondary | ICD-10-CM | POA: Diagnosis not present

## 2020-08-13 DIAGNOSIS — E1159 Type 2 diabetes mellitus with other circulatory complications: Secondary | ICD-10-CM

## 2020-08-13 DIAGNOSIS — D72829 Elevated white blood cell count, unspecified: Secondary | ICD-10-CM | POA: Diagnosis not present

## 2020-08-13 DIAGNOSIS — Z6824 Body mass index (BMI) 24.0-24.9, adult: Secondary | ICD-10-CM | POA: Diagnosis not present

## 2020-08-13 DIAGNOSIS — J9811 Atelectasis: Secondary | ICD-10-CM | POA: Diagnosis not present

## 2020-08-13 DIAGNOSIS — E46 Unspecified protein-calorie malnutrition: Secondary | ICD-10-CM | POA: Diagnosis not present

## 2020-08-13 DIAGNOSIS — Z794 Long term (current) use of insulin: Secondary | ICD-10-CM

## 2020-08-13 DIAGNOSIS — Z8249 Family history of ischemic heart disease and other diseases of the circulatory system: Secondary | ICD-10-CM | POA: Diagnosis not present

## 2020-08-13 DIAGNOSIS — H409 Unspecified glaucoma: Secondary | ICD-10-CM | POA: Diagnosis present

## 2020-08-13 DIAGNOSIS — E1165 Type 2 diabetes mellitus with hyperglycemia: Secondary | ICD-10-CM | POA: Diagnosis present

## 2020-08-13 DIAGNOSIS — Z79899 Other long term (current) drug therapy: Secondary | ICD-10-CM | POA: Diagnosis not present

## 2020-08-13 DIAGNOSIS — Z7983 Long term (current) use of bisphosphonates: Secondary | ICD-10-CM

## 2020-08-13 DIAGNOSIS — Z7982 Long term (current) use of aspirin: Secondary | ICD-10-CM

## 2020-08-13 DIAGNOSIS — R0602 Shortness of breath: Secondary | ICD-10-CM | POA: Diagnosis not present

## 2020-08-13 DIAGNOSIS — I5033 Acute on chronic diastolic (congestive) heart failure: Secondary | ICD-10-CM | POA: Diagnosis present

## 2020-08-13 DIAGNOSIS — J9601 Acute respiratory failure with hypoxia: Secondary | ICD-10-CM | POA: Diagnosis not present

## 2020-08-13 DIAGNOSIS — H9192 Unspecified hearing loss, left ear: Secondary | ICD-10-CM | POA: Diagnosis present

## 2020-08-13 DIAGNOSIS — I10 Essential (primary) hypertension: Secondary | ICD-10-CM

## 2020-08-13 DIAGNOSIS — E785 Hyperlipidemia, unspecified: Secondary | ICD-10-CM | POA: Diagnosis present

## 2020-08-13 DIAGNOSIS — D75839 Thrombocytosis, unspecified: Secondary | ICD-10-CM

## 2020-08-13 DIAGNOSIS — Z7984 Long term (current) use of oral hypoglycemic drugs: Secondary | ICD-10-CM | POA: Diagnosis not present

## 2020-08-13 DIAGNOSIS — E8809 Other disorders of plasma-protein metabolism, not elsewhere classified: Secondary | ICD-10-CM | POA: Diagnosis present

## 2020-08-13 DIAGNOSIS — M1990 Unspecified osteoarthritis, unspecified site: Secondary | ICD-10-CM | POA: Diagnosis not present

## 2020-08-13 DIAGNOSIS — Z885 Allergy status to narcotic agent status: Secondary | ICD-10-CM

## 2020-08-13 DIAGNOSIS — D492 Neoplasm of unspecified behavior of bone, soft tissue, and skin: Secondary | ICD-10-CM | POA: Diagnosis not present

## 2020-08-13 DIAGNOSIS — E441 Mild protein-calorie malnutrition: Secondary | ICD-10-CM | POA: Diagnosis not present

## 2020-08-13 DIAGNOSIS — Z8616 Personal history of COVID-19: Secondary | ICD-10-CM

## 2020-08-13 HISTORY — DX: Heart failure, unspecified: I50.9

## 2020-08-13 LAB — COMPREHENSIVE METABOLIC PANEL
ALT: 25 U/L (ref 0–44)
AST: 35 U/L (ref 15–41)
Albumin: 3.3 g/dL — ABNORMAL LOW (ref 3.5–5.0)
Alkaline Phosphatase: 112 U/L (ref 38–126)
Anion gap: 14 (ref 5–15)
BUN: 21 mg/dL (ref 8–23)
CO2: 23 mmol/L (ref 22–32)
Calcium: 8.8 mg/dL — ABNORMAL LOW (ref 8.9–10.3)
Chloride: 98 mmol/L (ref 98–111)
Creatinine, Ser: 0.88 mg/dL (ref 0.44–1.00)
GFR, Estimated: >60 mL/min (ref >60)
Glucose, Bld: 161 mg/dL — ABNORMAL HIGH (ref 70–99)
Potassium: 4.2 mmol/L (ref 3.5–5.1)
Sodium: 135 mmol/L (ref 135–145)
Total Bilirubin: 0.5 mg/dL (ref 0.3–1.2)
Total Protein: 7.2 g/dL (ref 6.5–8.1)

## 2020-08-13 LAB — CBC
HCT: 34 % — ABNORMAL LOW (ref 36.0–46.0)
Hemoglobin: 10.3 g/dL — ABNORMAL LOW (ref 12.0–15.0)
MCH: 26.4 pg (ref 26.0–34.0)
MCHC: 30.3 g/dL (ref 30.0–36.0)
MCV: 87.2 fL (ref 80.0–100.0)
Platelets: 412 10*3/uL — ABNORMAL HIGH (ref 150–400)
RBC: 3.9 MIL/uL (ref 3.87–5.11)
RDW: 15.1 % (ref 11.5–15.5)
WBC: 8.3 10*3/uL (ref 4.0–10.5)
nRBC: 0 % (ref 0.0–0.2)

## 2020-08-13 LAB — MAGNESIUM: Magnesium: 1.3 mg/dL — ABNORMAL LOW (ref 1.7–2.4)

## 2020-08-13 LAB — PROTIME-INR
INR: 1.1 (ref 0.8–1.2)
Prothrombin Time: 13.3 seconds (ref 11.4–15.2)

## 2020-08-13 LAB — BRAIN NATRIURETIC PEPTIDE: B Natriuretic Peptide: 751 pg/mL — ABNORMAL HIGH (ref 0.0–100.0)

## 2020-08-13 MED ORDER — MAGNESIUM SULFATE 2 GM/50ML IV SOLN
2.0000 g | Freq: Once | INTRAVENOUS | Status: AC
  Administered 2020-08-13: 2 g via INTRAVENOUS
  Filled 2020-08-13: qty 50

## 2020-08-13 MED ORDER — IOHEXOL 350 MG/ML SOLN
100.0000 mL | Freq: Once | INTRAVENOUS | Status: AC | PRN
  Administered 2020-08-13: 100 mL via INTRAVENOUS

## 2020-08-13 NOTE — ED Notes (Signed)
Pt. Ambulated down to the nurses station and back. Pt. Ambulated well. Pt. Had normal respiratory pattern and 94% SPO2. Upon returning to rest Pt. Was breathing heavily with an SPO2 of 88% for a period of 1-2 min. Pt. Returned to 93% SPO2 at rest. No oxygen therapy was used.

## 2020-08-13 NOTE — ED Provider Notes (Signed)
[facility] EMERGENCY DEPARTMENT Provider Note   CSN: [id number] Arrival date & time: 08/13/20  1514     History Chief Complaint  Patient presents with  . Shortness of Breath    [name] is a 85 y.o. female.  HPI    85 year old female comes in a chief complaint of shortness of breath.  She has history of CHF, diabetes and had COVID-19 last month.  Patient stays by herself.  She reports that over the last 2 or 3 days she has been having worsening shortness of breath.  Her shortness of breath is mostly exertional.  There is no associated chest pain.  She denies any worsening orthopnea or PND.  She also denies any increased weight gain and has no new cough, fevers, chills.  Past Medical History:  Diagnosis Date  . Arthritis    OA  . CHF (congestive heart failure) ([place])   . DDD (degenerative disc disease), lumbosacral   . Diabetes mellitus without complication ([place])   . Distal radius fracture    bilateral  . GERD (gastroesophageal reflux disease)   . Glaucoma   . HOH (hard of hearing)    left ear  . Hypertension   . PONV (postoperative nausea and vomiting)     Patient Active Problem List   Diagnosis Date Noted  . Acute exacerbation of CHF (congestive heart failure) ([place]) 08/13/2020  . COVID-19 virus infection   . Hyperlipidemia   . Insomnia   . CHF (congestive heart failure) ([place]) 07/10/2020  . Bilateral lower extremity edema 06/30/2020  . Chronic combined systolic and diastolic heart failure ([place]) 06/28/2020  . Hypertensive emergency 12/11/2019  . Abnormal EKG 12/11/2019  . Hypomagnesemia 12/11/2019  . Abnormal chest x-ray 12/11/2019  . Hypertensive urgency 12/11/2019  . Glaucoma   . GERD (gastroesophageal reflux disease)   . Hypocalcemia   . Type 2 diabetes mellitus ([place])   . Congestive heart failure ([place])   . SOB (shortness of breath)   . Uncontrolled type 2 diabetes mellitus with hyperglycemia ([place]) 09/01/2019  . Hypertension associated with diabetes  ([place]) 09/01/2019  . Hyperlipidemia associated with type 2 diabetes mellitus ([place]) 09/01/2019  . Microalbuminuria due to type 2 diabetes mellitus ([place]) 06/18/2019    Past Surgical History:  Procedure Laterality Date  . ABDOMINAL HYSTERECTOMY    . BREAST BIOPSY    . BREAST EXCISIONAL BIOPSY Left    benign  . BREAST SURGERY     left milk duct   . EYE SURGERY     cataract  . FRACTURE SURGERY Left    hip fracture  . OPEN REDUCTION INTERNAL FIXATION (ORIF) DISTAL RADIAL FRACTURE Bilateral 05/24/2015   Procedure: OPEN REDUCTION INTERNAL FIXATION (ORIF) BILATERAL DISTAL RADIAL FRACTURE VOLAR PLATE;  Surgeon: Daryll Brod, MD;  Location: [place];  Service: Orthopedics;  Laterality: Bilateral;     OB History   No obstetric history on file.     Family History  Problem Relation Age of Onset  . Heart disease Mother   . Heart disease Father     Social History   Tobacco Use  . Smoking status: Never Smoker  . Smokeless tobacco: Never Used  Vaping Use  . Vaping Use: Never used  Substance Use Topics  . Alcohol use: No  . Drug use: No    Home Medications Prior to Admission medications   Medication Sig Start Date End Date Taking? Authorizing Provider  albuterol (VENTOLIN HFA) 108 (90 Base) MCG/ACT inhaler Inhale 2  puffs into the lungs every 6 (six) hours as needed for wheezing or shortness of breath.   Yes [provider]  amLODipine (NORVASC) 10 MG tablet Take 1 tablet (10 mg total) by mouth daily. 09/01/19  Yes [name] M, DO  aspirin 81 MG tablet Take 81 mg by mouth daily.   Yes [provider]  atorvastatin (LIPITOR) 20 MG tablet Take 0.5 tablets (10 mg total) by mouth daily. 06/02/20  Yes Gottschalk, Leatrice Jewels M, DO  calcium-vitamin D (OSCAL WITH D) 500-200 MG-UNIT tablet Take 1 tablet by mouth.   Yes [provider]  DEXILANT 60 MG capsule Take 1 capsule by mouth daily. 02/13/20  Yes [provider]  fluticasone (FLONASE) 50  MCG/ACT nasal spray Place into both nostrils daily.   Yes [provider]  furosemide (LASIX) 40 MG tablet Take 1 tablet (40 mg total) by mouth daily. Patient taking differently: Take 40 mg by mouth 2 (two) times daily. 07/13/20  Yes Barton Dubois, MD  glipiZIDE (GLUCOTROL) 10 MG tablet TAKE 1 TABLET BY MOUTH ONCE DAILY BEFORE BREAKFAST 06/02/20  Yes [name] M, DO  hydrALAZINE (APRESOLINE) 25 MG tablet Take 3 tablets (75 mg total) by mouth every 8 (eight) hours. Patient taking differently: Take 75 mg by mouth daily. 12/12/19  Yes Johnson, Clanford L, MD  isosorbide mononitrate (IMDUR) 30 MG 24 hr tablet Take 1 tablet (30 mg total) by mouth daily. 07/14/20  Yes Barton Dubois, MD  LANTUS SOLOSTAR 100 UNIT/ML Solostar Pen INJECT 16 UNITS SUBCUTANEOUSLY AT BEDTIME Patient taking differently: Inject 16 Units into the skin at bedtime. 05/06/20  Yes Gottschalk, Ashly M, DO  latanoprost (XALATAN) 0.005 % ophthalmic solution 1 drop at bedtime.   Yes [provider]  lisinopril (ZESTRIL) 40 MG tablet Take 1 tablet by mouth once daily Patient taking differently: Take 40 mg by mouth 2 (two) times daily. 07/20/20  Yes [name] M, DO  metoprolol tartrate (LOPRESSOR) 50 MG tablet Take 1 tablet (50 mg total) by mouth 2 (two) times daily. 06/02/20  Yes [name] M, DO  multivitamin-iron-minerals-folic acid (CENTRUM) chewable tablet Chew 1 tablet by mouth daily.   Yes [provider]  potassium chloride (KLOR-CON) 10 MEQ tablet Take 1 tablet (10 mEq total) by mouth daily. 12/13/19  Yes Johnson, Clanford L, MD  sitaGLIPtin-metformin (JANUMET) 50-1000 MG tablet TAKE 1 TABLET BY MOUTH TWICE DAILY WITH A MEAL 06/02/20  Yes Gottschalk, Ashly M, DO  timolol (TIMOPTIC) 0.5 % ophthalmic solution Place 1 drop into both eyes daily. 01/27/19  Yes [provider]  traZODone (DESYREL) 50 MG tablet Take 0.5-1 tablets (25-50 mg total) by mouth at bedtime as needed for sleep.  06/28/20  Yes Janora Norlander, DO  Elastic Bandages & Supports (LUMBAR BACK BRACE/SUPPORT PAD) MISC Velcro closing back brace.  Use as needed for back pain.  Use no more than 1 time per week. 07/19/20   Janora Norlander, DO  glucose blood (ONETOUCH ULTRA) test strip Test BS twice daily Dx E11.9 04/06/20   [name] M, DO  ibandronate (BONIVA) 150 MG tablet Take 150 mg by mouth every 30 (thirty) days. Take in the morning with a full glass of water, on an empty stomach, and do not take anything else by mouth or lie down for the next 30 min. Patient not taking: No sig reported    [provider]    Allergies    Codeine  Review of Systems   Review of  Systems  Constitutional: Positive for activity change.  Respiratory: Positive for shortness of breath. Negative for cough.   Cardiovascular: Negative for chest pain.  Gastrointestinal: Negative for nausea and vomiting.  Allergic/Immunologic: Negative for immunocompromised state.  Hematological: Does not bruise/bleed easily.  All other systems reviewed and are negative.   Physical Exam Updated Vital Signs BP (!) 179/66   Pulse 80   Temp 98.6 F (37 C) (Oral)   Resp 20   Ht 5' (1.524 m)   Wt 56.7 kg   SpO2 99%   BMI 24.41 kg/m   Physical Exam Vitals and nursing note reviewed.  Constitutional:      Appearance: She is well-developed.  HENT:     Head: Normocephalic and atraumatic.  Cardiovascular:     Rate and Rhythm: Normal rate.  Pulmonary:     Effort: Pulmonary effort is normal.     Breath sounds: Decreased breath sounds present. No wheezing, rhonchi or rales.  Abdominal:     General: Bowel sounds are normal.  Musculoskeletal:     Cervical back: Normal range of motion and neck supple.  Skin:    General: Skin is warm and dry.  Neurological:     Mental Status: She is alert and oriented to person, place, and time.     ED Results / Procedures / Treatments   Labs (all labs ordered are listed, but only  abnormal results are displayed) Labs Reviewed  CBC - Abnormal; Notable for the following components:      Result Value   Hemoglobin 10.3 (*)    HCT 34.0 (*)    Platelets 412 (*)    All other components within normal limits  BRAIN NATRIURETIC PEPTIDE - Abnormal; Notable for the following components:   B Natriuretic Peptide 751.0 (*)    All other components within normal limits  MAGNESIUM - Abnormal; Notable for the following components:   Magnesium 1.3 (*)    All other components within normal limits  COMPREHENSIVE METABOLIC PANEL - Abnormal; Notable for the following components:   Glucose, Bld 161 (*)    Calcium 8.8 (*)    Albumin 3.3 (*)    All other components within normal limits  PROTIME-INR    EKG EKG Interpretation  Date/Time:  Friday August 13 2020 15:25:28 EDT Ventricular Rate:  97 PR Interval:  133 QRS Duration: 85 QT Interval:  353 QTC Calculation: 402 R Axis:   -6 Text Interpretation: Sinus rhythm Multiform ventricular premature complexes Abnormal T, consider ischemia, diffuse leads No acute changes Confirmed by Varney Biles ([id number]) on 08/13/2020 4:16:53 PM   Radiology CT Angio Chest PE W and/or Wo Contrast  Result Date: 08/13/2020 CLINICAL DATA:  Shortness of breath x2 days. EXAM: CT ANGIOGRAPHY CHEST WITH CONTRAST TECHNIQUE: Multidetector CT imaging of the chest was performed using the standard protocol during bolus administration of intravenous contrast. Multiplanar CT image reconstructions and MIPs were obtained to evaluate the vascular anatomy. CONTRAST:  161mL OMNIPAQUE IOHEXOL 350 MG/ML SOLN COMPARISON:  July 10, 2020 FINDINGS: Cardiovascular: There is moderate to marked severity calcification of the thoracic aorta, without evidence of aneurysmal dilatation or dissection. Satisfactory opacification of the pulmonary arteries to the segmental level. No evidence of pulmonary embolism. Normal heart size. No pericardial effusion. Mediastinum/Nodes: There is mild  pretracheal and AP window lymphadenopathy. Thyroid gland, trachea, and esophagus demonstrate no significant findings. Lungs/Pleura: Mild atelectasis is seen within the bilateral lower lobes. A trace amount of pleural fluid is seen, bilaterally. There is no evidence of  an acute infiltrate or pneumothorax. Upper Abdomen: No acute abnormality. Musculoskeletal: Chronic posterolateral seventh and eighth left rib fractures are seen. Chronic compression fracture deformities are seen at the levels of T3, T7, T12 and L1. A stable approximately 2.1 cm x 1.7 cm hypodense lesion is seen at the T1-T2 costovertebral junction on the left (axial CT image 9, CT series number 4) with a 2.6 cm x 2.0 cm hypodense lesion seen at the costovertebral junction on the right (axial CT image 48, CT series number 4). A similar appearing area is seen at this level on the right (axial CT image 13, CT series number 4). There is exaggerated kyphosis of the thoracic spine with multilevel degenerative changes. Review of the MIP images confirms the above findings. IMPRESSION: 1. No evidence of pulmonary embolus. 2. Mild bilateral lower lobe atelectasis with a trace amount of bilateral pleural fluid. 3. Stable bilateral costovertebral junction hypodense lesions suspicious for nerve sheath tumors. MRI correlation is recommended. 4. Multiple chronic compression fracture deformities of the thoracic spine. Aortic Atherosclerosis (ICD10-I70.0). Electronically Signed   By: Virgina Norfolk M.D.   On: 08/13/2020 20:13   DG Chest Portable 1 View  Result Date: 08/13/2020 CLINICAL DATA:  Shortness of breath EXAM: PORTABLE CHEST 1 VIEW COMPARISON:  July 10, 2020 chest radiograph and chest CT FINDINGS: Shallow degree of inspiration. Mild bibasilar atelectasis. No edema or airspace opacity. Heart size and pulmonary vascularity are normal. No adenopathy. Patient kyphotic. Marked collapse of a midthoracic vertebral body noted on chest CT is not well seen by  portable radiography. IMPRESSION: Shallow degree of inspiration. No edema or airspace opacity. Stable cardiac silhouette. Electronically Signed   By: Lowella Grip III M.D.   On: 08/13/2020 16:28    Procedures .Critical Care Performed by: Varney Biles, MD Authorized by: Varney Biles, MD   Critical care provider statement:    Critical care time (minutes):  52   Critical care was necessary to treat or prevent imminent or life-threatening deterioration of the following conditions:  Cardiac failure and respiratory failure   Critical care was time spent personally by me on the following activities:  Discussions with consultants, evaluation of patient's response to treatment, examination of patient, ordering and performing treatments and interventions, ordering and review of laboratory studies, ordering and review of radiographic studies, pulse oximetry, re-evaluation of patient's condition, obtaining history from patient or surrogate and review of old charts     Medications Ordered in ED Medications  magnesium sulfate IVPB 2 g 50 mL (0 g Intravenous Stopped 08/13/20 2033)  iohexol (OMNIPAQUE) 350 MG/ML injection 100 mL (100 mLs Intravenous Contrast Given 08/13/20 1941)    ED Course  I have reviewed the triage vital signs and the nursing notes.  Pertinent labs & imaging results that were available during my care of the patient were reviewed by me and considered in my medical decision making (see chart for details).  Clinical Course as of 08/13/20 2317  Fri Aug 13, 2020  2316 CT scan is negative for acute process.  BNP is at the same level it was during last admission.  Clinical suspicion remains that patient is decompensated CHF.  I turned off her O2 and her O2 sats dropped to 92%.  Ambulatory pulse ox ordered, I am sure it will reveal that her O2 sats are low.  COVID long holder is less likely given that she had minimal impact from COVID-19. Admit for optimization. [AN]    Clinical  Course User Index [AN] Kathrynn Humble,  Massimiliano Rohleder, MD   MDM Rules/Calculators/A&P                          85 year old comes in w/ chief complaint of shortness of breath.  She recently had COVID-19 and has history of CHF.  Shortness of breath is exertional.  Patient was hypoxic when she first arrived.  She is placed on 3 L of oxygen.  Exam is not consistent with significant volume overload.  In addition to CHF, PE, pneumonia, valvular disorder in the differential diagnosis.  CT PE ordered. Patient will likely need admission to the hospital for her new hypoxia and shortness of breath  Final Clinical Impression(s) / ED Diagnoses Final diagnoses:  Acute congestive heart failure, unspecified heart failure type ([place])  Acute hypoxemic respiratory failure [facility])    Rx / DC Orders ED Discharge Orders    None       Varney Biles, MD 08/13/20 2317

## 2020-08-13 NOTE — H&P (Addendum)
History and Physical  [name] YCX:[id number] DOB: Feb 10, 1936 DOA: 08/13/2020  Referring physician: Varney Biles, MD PCP: Janora Norlander, DO  Patient coming from: Home  Chief Complaint: Shortness of breath  HPI: [name] is an 85 y.o. female with medical history significant for hypertension, glaucoma, GERD, diabetes mellitus type 2, CHF and COVID-19 last month who presents to the emergency department due to 2 to 3 days of worsening shortness of breath.  Shortness of breath was mainly exertional, she denies increased weight gain, cough, fever, chills, chest pain, nausea or vomiting.  Patient states that she sleeps with head of bed inclined chronically.  Shortness of breath appears to improve at rest. Patient was admitted from 3/5-3/8 due to acute on chronic diastolic CHF, [id number] infection and hypertensive emergency.  ED Course:  In the emergency department, she was tachypneic, BP was 161/61 and other vital signs are within normal range.  Work-up in the ED showed normal CBC, BMP was normal except for hyperglycemia.  BNP 751 (this was 886 on 07/10/2020).  Magnesium was 1.3, albumin 3.3 CT angiography of chest with contrast showed no evidence of pulmonary embolus, but showed mild bilateral lower lobe atelectasis with a trace amount of bilateral pleural fluid.  Magnesium was replenished.  Hospitalist was asked to admit patient for further evaluation and management.  Review of Systems: Constitutional: Negative for chills and fever.  HENT: Negative for ear pain and sore throat.   Eyes: Negative for pain and visual disturbance.  Respiratory: Positive for shortness of breath.  Negative for cough, chest tightness  Cardiovascular: Negative for chest pain and palpitations.  Gastrointestinal: Negative for abdominal pain and vomiting.  Endocrine: Negative for polyphagia and polyuria.  Genitourinary: Negative for decreased urine volume, dysuria, enuresis Musculoskeletal: Negative for  arthralgias and back pain.  Skin: Negative for color change and rash.  Allergic/Immunologic: Negative for immunocompromised state.  Neurological: Negative for tremors, syncope, speech difficulty and headaches.  Hematological: Does not bruise/bleed easily.  All other systems reviewed and are negative  Past Medical History:  Diagnosis Date  . Arthritis    OA  . CHF (congestive heart failure) ([place])   . DDD (degenerative disc disease), lumbosacral   . Diabetes mellitus without complication ([place])   . Distal radius fracture    bilateral  . GERD (gastroesophageal reflux disease)   . Glaucoma   . HOH (hard of hearing)    left ear  . Hypertension   . PONV (postoperative nausea and vomiting)    Past Surgical History:  Procedure Laterality Date  . ABDOMINAL HYSTERECTOMY    . BREAST BIOPSY    . BREAST EXCISIONAL BIOPSY Left    benign  . BREAST SURGERY     left milk duct   . EYE SURGERY     cataract  . FRACTURE SURGERY Left    hip fracture  . OPEN REDUCTION INTERNAL FIXATION (ORIF) DISTAL RADIAL FRACTURE Bilateral 05/24/2015   Procedure: OPEN REDUCTION INTERNAL FIXATION (ORIF) BILATERAL DISTAL RADIAL FRACTURE VOLAR PLATE;  Surgeon: Daryll Brod, MD;  Location: [facility];  Service: Orthopedics;  Laterality: Bilateral;    Social History:  reports that she has never smoked. She has never used smokeless tobacco. She reports that she does not drink alcohol and does not use drugs.   Allergies  Allergen Reactions  . Codeine Nausea Only    Family History  Problem Relation Age of Onset  . Heart disease Mother   . Heart disease Father  Prior to Admission medications   Medication Sig Start Date End Date Taking? Authorizing Provider  albuterol (VENTOLIN HFA) 108 (90 Base) MCG/ACT inhaler Inhale 2 puffs into the lungs every 6 (six) hours as needed for wheezing or shortness of breath.   Yes [provider]  amLODipine (NORVASC) 10 MG tablet Take 1 tablet (10 mg  total) by mouth daily. 09/01/19  Yes [name] M, DO  aspirin 81 MG tablet Take 81 mg by mouth daily.   Yes [provider]  atorvastatin (LIPITOR) 20 MG tablet Take 0.5 tablets (10 mg total) by mouth daily. 06/02/20  Yes Gottschalk, Leatrice Jewels M, DO  calcium-vitamin D (OSCAL WITH D) 500-200 MG-UNIT tablet Take 1 tablet by mouth.   Yes [provider]  DEXILANT 60 MG capsule Take 1 capsule by mouth daily. 02/13/20  Yes [provider]  fluticasone (FLONASE) 50 MCG/ACT nasal spray Place into both nostrils daily.   Yes [provider]  furosemide (LASIX) 40 MG tablet Take 1 tablet (40 mg total) by mouth daily. Patient taking differently: Take 40 mg by mouth 2 (two) times daily. 07/13/20  Yes Barton Dubois, MD  glipiZIDE (GLUCOTROL) 10 MG tablet TAKE 1 TABLET BY MOUTH ONCE DAILY BEFORE BREAKFAST 06/02/20  Yes [name] M, DO  hydrALAZINE (APRESOLINE) 25 MG tablet Take 3 tablets (75 mg total) by mouth every 8 (eight) hours. Patient taking differently: Take 75 mg by mouth daily. 12/12/19  Yes Johnson, Clanford L, MD  isosorbide mononitrate (IMDUR) 30 MG 24 hr tablet Take 1 tablet (30 mg total) by mouth daily. 07/14/20  Yes Barton Dubois, MD  LANTUS SOLOSTAR 100 UNIT/ML Solostar Pen INJECT 16 UNITS SUBCUTANEOUSLY AT BEDTIME Patient taking differently: Inject 16 Units into the skin at bedtime. 05/06/20  Yes Gottschalk, Ashly M, DO  latanoprost (XALATAN) 0.005 % ophthalmic solution 1 drop at bedtime.   Yes [provider]  lisinopril (ZESTRIL) 40 MG tablet Take 1 tablet by mouth once daily Patient taking differently: Take 40 mg by mouth 2 (two) times daily. 07/20/20  Yes [name] M, DO  metoprolol tartrate (LOPRESSOR) 50 MG tablet Take 1 tablet (50 mg total) by mouth 2 (two) times daily. 06/02/20  Yes [name] M, DO  multivitamin-iron-minerals-folic acid (CENTRUM) chewable tablet Chew 1 tablet by mouth daily.   Yes [provider]   potassium chloride (KLOR-CON) 10 MEQ tablet Take 1 tablet (10 mEq total) by mouth daily. 12/13/19  Yes Johnson, Clanford L, MD  sitaGLIPtin-metformin (JANUMET) 50-1000 MG tablet TAKE 1 TABLET BY MOUTH TWICE DAILY WITH A MEAL 06/02/20  Yes Gottschalk, Ashly M, DO  timolol (TIMOPTIC) 0.5 % ophthalmic solution Place 1 drop into both eyes daily. 01/27/19  Yes [provider]  traZODone (DESYREL) 50 MG tablet Take 0.5-1 tablets (25-50 mg total) by mouth at bedtime as needed for sleep. 06/28/20  Yes Janora Norlander, DO  Elastic Bandages & Supports (LUMBAR BACK BRACE/SUPPORT PAD) MISC Velcro closing back brace.  Use as needed for back pain.  Use no more than 1 time per week. 07/19/20   Janora Norlander, DO  glucose blood (ONETOUCH ULTRA) test strip Test BS twice daily Dx E11.9 04/06/20   [name] M, DO  ibandronate (BONIVA) 150 MG tablet Take 150 mg by mouth every 30 (thirty) days. Take in the morning with a full glass of water, on an empty stomach, and do not take anything else by mouth or lie down for the next 30 min. Patient not  taking: No sig reported    [provider]    Physical Exam: BP (!) 195/96   Pulse 84   Temp 98 F (36.7 C)   Resp (!) 24   Ht 5' (1.524 m)   Wt 56.4 kg   SpO2 92%   BMI 24.28 kg/m   . General: 85 y.o. year-old female well developed well nourished in no acute distress.  Alert and oriented x3. Marland Kitchen HEENT: NCAT, EOMI . Neck: Supple, trachea medial . Cardiovascular: Regular rate and rhythm with no rubs or gallops.  No thyromegaly or JVD noted.  2/4 pulses in all 4 extremities. Marland Kitchen Respiratory: Decreased breath sounds bilaterally.  No wheezes. . Abdomen: Soft nontender nondistended with normal bowel sounds x4 quadrants. . Muskuloskeletal: +2 edema bilaterally to mid shin.  No cyanosis or clubbing . Neuro: CN II-XII intact, strength 5/5 x 4, no focal neurologic deficit . Skin: No ulcerative lesions noted or rashes . Psychiatry:  Mood is  appropriate for condition and setting          Labs on Admission:  Basic Metabolic Panel: Recent Labs  Lab 08/13/20 1549  NA 135  K 4.2  CL 98  CO2 23  GLUCOSE 161*  BUN 21  CREATININE 0.88  CALCIUM 8.8*  MG 1.3*   Liver Function Tests: Recent Labs  Lab 08/13/20 1549  AST 35  ALT 25  ALKPHOS 112  BILITOT 0.5  PROT 7.2  ALBUMIN 3.3*   No results for input(s): LIPASE, AMYLASE in the last 168 hours. No results for input(s): AMMONIA in the last 168 hours. CBC: Recent Labs  Lab 08/13/20 1549  WBC 8.3  HGB 10.3*  HCT 34.0*  MCV 87.2  PLT 412*   Cardiac Enzymes: No results for input(s): CKTOTAL, CKMB, CKMBINDEX, TROPONINI in the last 168 hours.  BNP (last 3 results) Recent Labs    06/18/20 0936 07/10/20 1940 08/13/20 1549  BNP 346.0* 886.0* 751.0*    ProBNP (last 3 results) No results for input(s): PROBNP in the last 8760 hours.  CBG: No results for input(s): GLUCAP in the last 168 hours.  Radiological Exams on Admission: CT Angio Chest PE W and/or Wo Contrast  Result Date: 08/13/2020 CLINICAL DATA:  Shortness of breath x2 days. EXAM: CT ANGIOGRAPHY CHEST WITH CONTRAST TECHNIQUE: Multidetector CT imaging of the chest was performed using the standard protocol during bolus administration of intravenous contrast. Multiplanar CT image reconstructions and MIPs were obtained to evaluate the vascular anatomy. CONTRAST:  116mL OMNIPAQUE IOHEXOL 350 MG/ML SOLN COMPARISON:  July 10, 2020 FINDINGS: Cardiovascular: There is moderate to marked severity calcification of the thoracic aorta, without evidence of aneurysmal dilatation or dissection. Satisfactory opacification of the pulmonary arteries to the segmental level. No evidence of pulmonary embolism. Normal heart size. No pericardial effusion. Mediastinum/Nodes: There is mild pretracheal and AP window lymphadenopathy. Thyroid gland, trachea, and esophagus demonstrate no significant findings. Lungs/Pleura: Mild  atelectasis is seen within the bilateral lower lobes. A trace amount of pleural fluid is seen, bilaterally. There is no evidence of an acute infiltrate or pneumothorax. Upper Abdomen: No acute abnormality. Musculoskeletal: Chronic posterolateral seventh and eighth left rib fractures are seen. Chronic compression fracture deformities are seen at the levels of T3, T7, T12 and L1. A stable approximately 2.1 cm x 1.7 cm hypodense lesion is seen at the T1-T2 costovertebral junction on the left (axial CT image 9, CT series number 4) with a 2.6 cm x 2.0 cm hypodense lesion seen at the costovertebral  junction on the right (axial CT image 48, CT series number 4). A similar appearing area is seen at this level on the right (axial CT image 13, CT series number 4). There is exaggerated kyphosis of the thoracic spine with multilevel degenerative changes. Review of the MIP images confirms the above findings. IMPRESSION: 1. No evidence of pulmonary embolus. 2. Mild bilateral lower lobe atelectasis with a trace amount of bilateral pleural fluid. 3. Stable bilateral costovertebral junction hypodense lesions suspicious for nerve sheath tumors. MRI correlation is recommended. 4. Multiple chronic compression fracture deformities of the thoracic spine. Aortic Atherosclerosis (ICD10-I70.0). Electronically Signed   By: Virgina Norfolk M.D.   On: 08/13/2020 20:13   DG Chest Portable 1 View  Result Date: 08/13/2020 CLINICAL DATA:  Shortness of breath EXAM: PORTABLE CHEST 1 VIEW COMPARISON:  July 10, 2020 chest radiograph and chest CT FINDINGS: Shallow degree of inspiration. Mild bibasilar atelectasis. No edema or airspace opacity. Heart size and pulmonary vascularity are normal. No adenopathy. Patient kyphotic. Marked collapse of a midthoracic vertebral body noted on chest CT is not well seen by portable radiography. IMPRESSION: Shallow degree of inspiration. No edema or airspace opacity. Stable cardiac silhouette. Electronically  Signed   By: Lowella Grip III M.D.   On: 08/13/2020 16:28    EKG: I independently viewed the EKG done and my findings are as followed: Normal sinus rhythm at a rate of 97 bpm  Assessment/Plan Present on Admission: . Hypomagnesemia . GERD (gastroesophageal reflux disease)  Principal Problem:   Acute exacerbation of CHF (congestive heart failure) (HCC) Active Problems:   Hyperglycemia due to diabetes mellitus ([facility])   Essential hypertension   GERD (gastroesophageal reflux disease)   Hypomagnesemia   Shortness of breath   Hypoalbuminemia due to protein-calorie malnutrition ([facility])   Nerve sheath tumor  Shortness of breath possibly secondary to acute exacerbation of CHF CT angiogram PE of chest showed mild bilateral lower lobe atelectasis with a trace amount of bilateral pleural fluid.  BNP 751 (this was 886 on 07/10/2020) Continue total input/output, daily weights and fluid restriction Continue IV Lasix 40 twice daily (with holding parameters) Continue aspirin, Lipitor, Lopressor, lisinopril Continue Cardiac diet  EKG personally reviewed showed normal sinus rhythm at a rate of 87 bpm Echocardiogram done on 3/6 showed an LVEF of 70-75%.  Pulmonary artery systolic pressure was elevated and RV systolic pressure was 88.8 mmHg.   Hypomagnesemia Magnesium 1.3, this was replenished  Hyperglycemia secondary to T2DM Continue ISS and hypoglycemia protocol Glipizide will be held at this time  Hypoalbuminemia secondary to mild protein calorie malnutrition Albumin 3.3, protein supplement will be provided  GERD Continue Protonix  Essential hypertension (uncontrolled) Continue lisinopril, Lopressor, Imdur  Hyperlipidemia Continue Lipitor  Glaucoma Continue Timoptic and latanoprost  Nerve sheath tumor CT angiogram for chest with contrast showed stable bilateral costovertebral junction hypodense lesions suspicious for nerve sheath tumors. MRI correlation is recommended (this may be  done as an outpatient)  DVT prophylaxis: Lovenox  Code Status: Full code  Family Communication: None at bedside  Disposition Plan:  Patient is from:                        home Anticipated DC to:                   SNF or family members home Anticipated DC date:               2-3 days Anticipated DC  barriers:           Patient is unstable to be discharged at this time due to acute renal CHF requiring inpatient management  Consults called: None  Admission status: Inpatient    Bernadette Hoit MD [facility]  08/14/2020, 2:54 AM

## 2020-08-13 NOTE — ED Triage Notes (Signed)
Pt with sob for past 2 days.  Hx of CHF

## 2020-08-13 NOTE — ED Notes (Signed)
EDP seen pt in room. MSE signature not needed

## 2020-08-14 ENCOUNTER — Encounter (HOSPITAL_COMMUNITY): Payer: Self-pay | Admitting: Internal Medicine

## 2020-08-14 DIAGNOSIS — J9601 Acute respiratory failure with hypoxia: Secondary | ICD-10-CM | POA: Diagnosis not present

## 2020-08-14 DIAGNOSIS — E46 Unspecified protein-calorie malnutrition: Secondary | ICD-10-CM

## 2020-08-14 DIAGNOSIS — I10 Essential (primary) hypertension: Secondary | ICD-10-CM | POA: Diagnosis not present

## 2020-08-14 DIAGNOSIS — I5033 Acute on chronic diastolic (congestive) heart failure: Secondary | ICD-10-CM | POA: Diagnosis not present

## 2020-08-14 DIAGNOSIS — D492 Neoplasm of unspecified behavior of bone, soft tissue, and skin: Secondary | ICD-10-CM

## 2020-08-14 DIAGNOSIS — E8809 Other disorders of plasma-protein metabolism, not elsewhere classified: Secondary | ICD-10-CM

## 2020-08-14 LAB — GLUCOSE, CAPILLARY
Glucose-Capillary: 173 mg/dL — ABNORMAL HIGH (ref 70–99)
Glucose-Capillary: 213 mg/dL — ABNORMAL HIGH (ref 70–99)
Glucose-Capillary: 165 mg/dL — ABNORMAL HIGH (ref 70–99)
Glucose-Capillary: 147 mg/dL — ABNORMAL HIGH (ref 70–99)

## 2020-08-14 LAB — BASIC METABOLIC PANEL
Anion gap: 13 (ref 5–15)
BUN: 14 mg/dL (ref 8–23)
CO2: 25 mmol/L (ref 22–32)
Calcium: 8.6 mg/dL — ABNORMAL LOW (ref 8.9–10.3)
Chloride: 98 mmol/L (ref 98–111)
Creatinine, Ser: 0.64 mg/dL (ref 0.44–1.00)
GFR, Estimated: >60 mL/min (ref >60)
Glucose, Bld: 181 mg/dL — ABNORMAL HIGH (ref 70–99)
Potassium: 3.9 mmol/L (ref 3.5–5.1)
Sodium: 136 mmol/L (ref 135–145)

## 2020-08-14 LAB — CBC
HCT: 36.3 % (ref 36.0–46.0)
Hemoglobin: 10.9 g/dL — ABNORMAL LOW (ref 12.0–15.0)
MCH: 25.7 pg — ABNORMAL LOW (ref 26.0–34.0)
MCHC: 30 g/dL (ref 30.0–36.0)
MCV: 85.6 fL (ref 80.0–100.0)
Platelets: 426 10*3/uL — ABNORMAL HIGH (ref 150–400)
RBC: 4.24 MIL/uL (ref 3.87–5.11)
RDW: 14.8 % (ref 11.5–15.5)
WBC: 8 10*3/uL (ref 4.0–10.5)
nRBC: 0 % (ref 0.0–0.2)

## 2020-08-14 LAB — HEMOGLOBIN A1C
Hgb A1c MFr Bld: 7.4 % — ABNORMAL HIGH (ref 4.8–5.6)
Mean Plasma Glucose: 165.68 mg/dL

## 2020-08-14 LAB — APTT: aPTT: 29 seconds (ref 24–36)

## 2020-08-14 LAB — PHOSPHORUS: Phosphorus: 2.7 mg/dL (ref 2.5–4.6)

## 2020-08-14 LAB — MAGNESIUM: Magnesium: 1.9 mg/dL (ref 1.7–2.4)

## 2020-08-14 LAB — PROTIME-INR
INR: 1.1 (ref 0.8–1.2)
Prothrombin Time: 13.6 seconds (ref 11.4–15.2)

## 2020-08-14 MED ORDER — HYDRALAZINE HCL 20 MG/ML IJ SOLN
10.0000 mg | Freq: Once | INTRAMUSCULAR | Status: AC
  Administered 2020-08-14: 10 mg via INTRAVENOUS
  Filled 2020-08-14: qty 1

## 2020-08-14 MED ORDER — ISOSORBIDE MONONITRATE ER 60 MG PO TB24
30.0000 mg | ORAL_TABLET | Freq: Every day | ORAL | Status: DC
  Administered 2020-08-14 – 2020-08-16 (×3): 30 mg via ORAL
  Filled 2020-08-14 (×3): qty 1

## 2020-08-14 MED ORDER — POTASSIUM CHLORIDE CRYS ER 20 MEQ PO TBCR
20.0000 meq | EXTENDED_RELEASE_TABLET | Freq: Every day | ORAL | Status: DC
  Administered 2020-08-14 – 2020-08-16 (×3): 20 meq via ORAL
  Filled 2020-08-14 (×3): qty 1

## 2020-08-14 MED ORDER — HYDRALAZINE HCL 25 MG PO TABS
25.0000 mg | ORAL_TABLET | Freq: Three times a day (TID) | ORAL | Status: DC
  Administered 2020-08-14 – 2020-08-15 (×4): 25 mg via ORAL
  Filled 2020-08-14 (×4): qty 1

## 2020-08-14 MED ORDER — ASPIRIN 81 MG PO CHEW
81.0000 mg | CHEWABLE_TABLET | Freq: Every day | ORAL | Status: DC
  Administered 2020-08-14 – 2020-08-16 (×3): 81 mg via ORAL
  Filled 2020-08-14 (×3): qty 1

## 2020-08-14 MED ORDER — TIMOLOL MALEATE 0.5 % OP SOLN
1.0000 [drp] | Freq: Every day | OPHTHALMIC | Status: DC
  Administered 2020-08-14 – 2020-08-16 (×3): 1 [drp] via OPHTHALMIC
  Filled 2020-08-14: qty 5

## 2020-08-14 MED ORDER — INSULIN ASPART 100 UNIT/ML ~~LOC~~ SOLN
0.0000 [IU] | Freq: Three times a day (TID) | SUBCUTANEOUS | Status: DC
  Administered 2020-08-14: 3 [IU] via SUBCUTANEOUS
  Administered 2020-08-14 (×2): 2 [IU] via SUBCUTANEOUS
  Administered 2020-08-15: 5 [IU] via SUBCUTANEOUS
  Administered 2020-08-15: 2 [IU] via SUBCUTANEOUS
  Administered 2020-08-15: 1 [IU] via SUBCUTANEOUS
  Administered 2020-08-16: 2 [IU] via SUBCUTANEOUS
  Administered 2020-08-16: 7 [IU] via SUBCUTANEOUS

## 2020-08-14 MED ORDER — FUROSEMIDE 10 MG/ML IJ SOLN
40.0000 mg | Freq: Two times a day (BID) | INTRAMUSCULAR | Status: AC
  Administered 2020-08-14 – 2020-08-15 (×4): 40 mg via INTRAVENOUS
  Filled 2020-08-14 (×4): qty 4

## 2020-08-14 MED ORDER — METOPROLOL TARTRATE 50 MG PO TABS
50.0000 mg | ORAL_TABLET | Freq: Two times a day (BID) | ORAL | Status: DC
  Administered 2020-08-14: 50 mg via ORAL
  Filled 2020-08-14: qty 1

## 2020-08-14 MED ORDER — METOPROLOL TARTRATE 50 MG PO TABS
75.0000 mg | ORAL_TABLET | Freq: Two times a day (BID) | ORAL | Status: DC
  Administered 2020-08-14 – 2020-08-16 (×4): 75 mg via ORAL
  Filled 2020-08-14 (×4): qty 1

## 2020-08-14 MED ORDER — LISINOPRIL 10 MG PO TABS
40.0000 mg | ORAL_TABLET | Freq: Every day | ORAL | Status: DC
  Administered 2020-08-14 – 2020-08-16 (×3): 40 mg via ORAL
  Filled 2020-08-14 (×3): qty 4

## 2020-08-14 MED ORDER — ATORVASTATIN CALCIUM 10 MG PO TABS
10.0000 mg | ORAL_TABLET | Freq: Every day | ORAL | Status: DC
  Administered 2020-08-14 – 2020-08-16 (×3): 10 mg via ORAL
  Filled 2020-08-14 (×3): qty 1

## 2020-08-14 MED ORDER — PANTOPRAZOLE SODIUM 40 MG PO TBEC
40.0000 mg | DELAYED_RELEASE_TABLET | Freq: Every day | ORAL | Status: DC
  Administered 2020-08-14 – 2020-08-16 (×3): 40 mg via ORAL
  Filled 2020-08-14 (×3): qty 1

## 2020-08-14 MED ORDER — GLUCERNA SHAKE PO LIQD
237.0000 mL | Freq: Three times a day (TID) | ORAL | Status: DC
  Administered 2020-08-14 – 2020-08-16 (×6): 237 mL via ORAL

## 2020-08-14 MED ORDER — LATANOPROST 0.005 % OP SOLN
1.0000 [drp] | Freq: Every day | OPHTHALMIC | Status: DC
  Administered 2020-08-14 – 2020-08-15 (×2): 1 [drp] via OPHTHALMIC
  Filled 2020-08-14: qty 2.5

## 2020-08-14 MED ORDER — ACETAMINOPHEN 325 MG PO TABS: 650.0000 mg | ORAL_TABLET | Freq: Four times a day (QID) | ORAL | Status: DC | PRN

## 2020-08-14 MED ORDER — INSULIN GLARGINE 100 UNIT/ML ~~LOC~~ SOLN
5.0000 [IU] | Freq: Every day | SUBCUTANEOUS | Status: DC
  Administered 2020-08-14 – 2020-08-16 (×3): 5 [IU] via SUBCUTANEOUS
  Filled 2020-08-14 (×4): qty 0.05

## 2020-08-14 MED ORDER — MAGNESIUM SULFATE IN D5W 1-5 GM/100ML-% IV SOLN
1.0000 g | Freq: Once | INTRAVENOUS | Status: AC
  Administered 2020-08-14: 1 g via INTRAVENOUS
  Filled 2020-08-14: qty 100

## 2020-08-14 MED ORDER — ONDANSETRON HCL 4 MG/2ML IJ SOLN: 4.0000 mg | Freq: Four times a day (QID) | INTRAMUSCULAR | Status: DC | PRN

## 2020-08-14 MED ORDER — INSULIN ASPART 100 UNIT/ML ~~LOC~~ SOLN: 0.0000 [IU] | Freq: Every day | SUBCUTANEOUS | Status: DC

## 2020-08-14 MED ORDER — ENOXAPARIN SODIUM 40 MG/0.4ML ~~LOC~~ SOLN
40.0000 mg | SUBCUTANEOUS | Status: DC
  Administered 2020-08-14 – 2020-08-16 (×3): 40 mg via SUBCUTANEOUS
  Filled 2020-08-14 (×3): qty 0.4

## 2020-08-14 NOTE — Progress Notes (Signed)
PROGRESS NOTE  [name] JEH:[id number] DOB: [birth year] DOA: 08/13/2020 PCP: Janora Norlander, DO  Brief History:  85 year old female with a history of hypertension, diabetes mellitus type 2, diastolic CHF, hyperlipidemia, GERD presenting with 3-day history of shortness of breath, increasing ankle edema and orthopnea.  She has had to sleep in a recliner for the past 2 weeks. The patient was recently admitted to the hospital from 07/10/2020 to 07/13/2020 when she was treated for acute on chronic diastolic CHF.  She was noted to have a COVID-19 infection at that time and completed 3 days of remdesivir.  The patient was sent home with furosemide 40 mg once daily.  Since discharge from the hospital, the patient endorses compliance with all her medications.  She denies any indiscretion with her fluid or dietary intake.  She does not have any fevers, chills, chest pain, nausea, vomiting, diarrhea, abdominal pain.  There is no cough no hemoptysis. In the emergency department, the patient was afebrile and hemodynamically stable with oxygen saturations 92 to 96% on 3 L.  The patient was started on IV furosemide for congestive heart failure.  Assessment/Plan: Acute respiratory failure with hypoxia -Secondary to pulmonary edema -Oxygen saturation 88% on room air -Currently stable on 3 L nasal cannula -Wean oxygen for saturation greater 92% -08/13/2020 CTA chest negative for PE, trace bilateral pleural effusion, chronic T3, T7, T12, L1 compression fractures.  Stable costal vertebral hypodensity suspicious for nerve sheath tumor  Acute on chronic diastolic CHF -Remains clinically fluid overloaded -Continue IV furosemide -Daily weights -Accurate I's and O's -07/11/2020 echo EF 70 to 75%, no WMA, severe elevated PASP 86 mmHg, moderate MR, mild TR  Uncontrolled diabetes mellitus type 2 with hyperglycemia -Start reduced dose Lantus -NovoLog sliding scale -Holding sitagliptin Metformin -Holding  glipizide -12/11/19 A1C--6.7 -recheck A1C  Essential hypertension -Continue metoprolol tartrate and amlodipine -Restart hydralazine -Continue lisinopril -Continue Imdur -Add hydralazine  Hyperlipidemia -Continue statin  GERD Continue PPI      Status is: Inpatient  Remains inpatient appropriate because:IV treatments appropriate due to intensity of illness or inability to take PO   Dispo: The patient is from: Home              Anticipated d/c is to: Home              Patient currently is not medically stable to d/c.   Difficult to place patient No        Family Communication:   No Family at bedside  Consultants:  none Code Status:  FULL   DVT Prophylaxis:   [place] Lovenox   Procedures: As Listed in Progress Note Above  Antibiotics: None      Subjective: Patient continues to have sob and orthopnea.  Sob a little better, but remains sob with exertion.  Denies f/c, cp, n/v/d, abd pain  Objective: Vitals:   08/13/20 2230 08/14/20 0000 08/14/20 0100 08/14/20 0444  BP: (!) 174/70  (!) 195/96 (!) 187/53  Pulse: 73 72 84 86  Resp: (!) 23 (!) 25 (!) 24 (!) 22  Temp:   98 F (36.7 C) 97.8 F (36.6 C)  TempSrc:      SpO2: 100% 96% 92% 96%  Weight:   56.4 kg   Height:        Intake/Output Summary (Last 24 hours) at 08/14/2020 0710 Last data filed at 08/14/2020 0538 Gross per 24 hour  Intake 150 ml  Output --  Net 150 ml   Weight change:  Exam:   General:  Pt is alert, follows commands appropriately, not in acute distress  HEENT: No icterus, No thrush, No neck mass, [place]/AT  Cardiovascular: RRR, S1/S2, no rubs, no gallops +PACs  Respiratory: bibasilar crackles. No wheeze  Abdomen: Soft/+BS, non tender, non distended, no guarding  Extremities: 1 + LE edema, No lymphangitis, No petechiae, No rashes, no synovitis   Data Reviewed: I have personally reviewed following labs and imaging studies Basic Metabolic Panel: Recent Labs  Lab 08/13/20 1549   NA 135  K 4.2  CL 98  CO2 23  GLUCOSE 161*  BUN 21  CREATININE 0.88  CALCIUM 8.8*  MG 1.3*   Liver Function Tests: Recent Labs  Lab 08/13/20 1549  AST 35  ALT 25  ALKPHOS 112  BILITOT 0.5  PROT 7.2  ALBUMIN 3.3*   No results for input(s): LIPASE, AMYLASE in the last 168 hours. No results for input(s): AMMONIA in the last 168 hours. Coagulation Profile: Recent Labs  Lab 08/13/20 1549  INR 1.1   CBC: Recent Labs  Lab 08/13/20 1549 08/14/20 0620  WBC 8.3 8.0  HGB 10.3* 10.9*  HCT 34.0* 36.3  MCV 87.2 85.6  PLT 412* 426*   Cardiac Enzymes: No results for input(s): CKTOTAL, CKMB, CKMBINDEX, TROPONINI in the last 168 hours. BNP: Invalid input(s): POCBNP CBG: No results for input(s): GLUCAP in the last 168 hours. HbA1C: No results for input(s): HGBA1C in the last 72 hours. Urine analysis:    Component Value Date/Time   APPEARANCEUR Clear 09/29/2019 0906   GLUCOSEU Negative 09/29/2019 0906   BILIRUBINUR Negative 09/29/2019 0906   PROTEINUR 2+ (A) 09/29/2019 0906   NITRITE Negative 09/29/2019 0906   LEUKOCYTESUR 2+ (A) 09/29/2019 0906   Sepsis Labs: @LABRCNTIP (procalcitonin:4,lacticidven:4) )No results found for this or any previous visit (from the past 240 hour(s)).   Scheduled Meds: . aspirin  81 mg Oral Daily  . atorvastatin  10 mg Oral Daily  . enoxaparin (LOVENOX) injection  40 mg Subcutaneous Q24H  . feeding supplement (GLUCERNA SHAKE)  237 mL Oral TID BM  . furosemide  40 mg Intravenous Q12H  . insulin aspart  0-5 Units Subcutaneous QHS  . insulin aspart  0-9 Units Subcutaneous TID WC  . isosorbide mononitrate  30 mg Oral Daily  . latanoprost  1 drop Both Eyes QHS  . lisinopril  40 mg Oral Daily  . metoprolol tartrate  50 mg Oral BID  . pantoprazole  40 mg Oral Daily  . timolol  1 drop Both Eyes Daily   Continuous Infusions:  Procedures/Studies: CT Angio Chest PE W and/or Wo Contrast  Result Date: 08/13/2020 CLINICAL DATA:  Shortness of  breath x2 days. EXAM: CT ANGIOGRAPHY CHEST WITH CONTRAST TECHNIQUE: Multidetector CT imaging of the chest was performed using the standard protocol during bolus administration of intravenous contrast. Multiplanar CT image reconstructions and MIPs were obtained to evaluate the vascular anatomy. CONTRAST:  160mL OMNIPAQUE IOHEXOL 350 MG/ML SOLN COMPARISON:  July 10, 2020 FINDINGS: Cardiovascular: There is moderate to marked severity calcification of the thoracic aorta, without evidence of aneurysmal dilatation or dissection. Satisfactory opacification of the pulmonary arteries to the segmental level. No evidence of pulmonary embolism. Normal heart size. No pericardial effusion. Mediastinum/Nodes: There is mild pretracheal and AP window lymphadenopathy. Thyroid gland, trachea, and esophagus demonstrate no significant findings. Lungs/Pleura: Mild atelectasis is seen within the bilateral lower lobes. A trace amount of pleural fluid is seen, bilaterally. There  is no evidence of an acute infiltrate or pneumothorax. Upper Abdomen: No acute abnormality. Musculoskeletal: Chronic posterolateral seventh and eighth left rib fractures are seen. Chronic compression fracture deformities are seen at the levels of T3, T7, T12 and L1. A stable approximately 2.1 cm x 1.7 cm hypodense lesion is seen at the T1-T2 costovertebral junction on the left (axial CT image 9, CT series number 4) with a 2.6 cm x 2.0 cm hypodense lesion seen at the costovertebral junction on the right (axial CT image 48, CT series number 4). A similar appearing area is seen at this level on the right (axial CT image 13, CT series number 4). There is exaggerated kyphosis of the thoracic spine with multilevel degenerative changes. Review of the MIP images confirms the above findings. IMPRESSION: 1. No evidence of pulmonary embolus. 2. Mild bilateral lower lobe atelectasis with a trace amount of bilateral pleural fluid. 3. Stable bilateral costovertebral junction  hypodense lesions suspicious for nerve sheath tumors. MRI correlation is recommended. 4. Multiple chronic compression fracture deformities of the thoracic spine. Aortic Atherosclerosis (ICD10-I70.0). Electronically Signed   By: Virgina Norfolk M.D.   On: 08/13/2020 20:13   DG Chest Portable 1 View  Result Date: 08/13/2020 CLINICAL DATA:  Shortness of breath EXAM: PORTABLE CHEST 1 VIEW COMPARISON:  July 10, 2020 chest radiograph and chest CT FINDINGS: Shallow degree of inspiration. Mild bibasilar atelectasis. No edema or airspace opacity. Heart size and pulmonary vascularity are normal. No adenopathy. Patient kyphotic. Marked collapse of a midthoracic vertebral body noted on chest CT is not well seen by portable radiography. IMPRESSION: Shallow degree of inspiration. No edema or airspace opacity. Stable cardiac silhouette. Electronically Signed   By: Lowella Grip III M.D.   On: 08/13/2020 16:28    Orson Eva, DO  [facility]  If 7PM-7AM, please contact night-coverage [email or web address] Password [email or web address] 08/14/2020, 7:10 AM   LOS: 1 day

## 2020-08-14 NOTE — Evaluation (Signed)
Physical Therapy Evaluation Patient Details Name: [name] MRN: [id number] DOB: Apr 30, 1936 Today's Date: 08/14/2020   History of Present Illness  [name] is an 85 y.o. female with medical history significant for hypertension, glaucoma, GERD, diabetes mellitus type 2, CHF and COVID-19 last month who presents to the emergency department due to 2 to 3 days of worsening shortness of breath.  Shortness of breath was mainly exertional, she denies increased weight gain, cough, fever, chills, chest pain, nausea or vomiting.  Patient states that she sleeps with head of bed inclined chronically.  Shortness of breath appears to improve at rest.  Patient was admitted from 3/5-3/8 due to acute on chronic diastolic CHF, GYJEH-63 infection and hypertensive emergency.  Clinical Impression  Pt sitting in chair.  States she normally uses a RW but no O2.  PT was ambulated with O2 and able to ambulate 143ft pushing an Oxygen cart which would take more exertion than a RW.  Pt able to go 180 ft.     Follow Up Recommendations No PT follow up    Equipment Recommendations  None recommended by PT    Recommendations for Other Services   none    Precautions / Restrictions Precautions Precautions: None Restrictions Weight Bearing Restrictions: No      Mobility  Bed Mobility Overal bed mobility: Modified Independent                  Transfers Overall transfer level: Modified independent Equipment used: Rolling walker (2 wheeled)                Ambulation/Gait Ambulation/Gait assistance: Modified independent (Device/Increase time) Gait Distance (Feet): 180 Feet Assistive device:  (pushed oxygen cart with 6 L of O2 in it) Gait Pattern/deviations: WFL(Within Functional Limits)   Gait velocity interpretation: <1.31 ft/sec, indicative of household ambulator             Pertinent Vitals/Pain Pain Assessment: No/denies pain    Home Living Family/patient expects to be  discharged to:: Private residence Living Arrangements: Alone Available Help at Discharge: Family Type of Home: House Home Access: Stairs to enter     Home Layout: One level Home Equipment: Environmental consultant - 2 wheels      Prior Function Level of Independence: Independent with assistive device(s)                  Extremity/Trunk Assessment        Lower Extremity Assessment Lower Extremity Assessment: Overall WFL for tasks assessed       Communication   Communication: No difficulties  Cognition Arousal/Alertness: Awake/alert Behavior During Therapy: WFL for tasks assessed/performed Overall Cognitive Status: Within Functional Limits for tasks assessed                                               Assessment/Plan    PT Assessment Patent does not need any further PT services anticipate D/C from hospital   PT Problem List  SOB with exertion        PT Treatment Interventions   Eval only pt does not need skilled physical therapy.   PT Goals (Current goals can be found in the Care Plan section)  Acute Rehab PT Goals Patient Stated Goal: To go home PT Goal Formulation: With patient Time For Goal Achievement: 08/15/20 Potential to Achieve Goals: Good    Frequency  NA    Barriers to discharge    may need O2        AM-PAC PT "6 Clicks" Mobility  Outcome Measure Help needed turning from your back to your side while in a flat bed without using bedrails?: None Help needed moving from lying on your back to sitting on the side of a flat bed without using bedrails?: A Little Help needed moving to and from a bed to a chair (including a wheelchair)?: None Help needed standing up from a chair using your arms (e.g., wheelchair or bedside chair)?: None Help needed to walk in hospital room?: A Little Help needed climbing 3-5 steps with a railing? : A Little 6 Click Score: 21    End of Session Equipment Utilized During Treatment: Gait belt;Other  (comment);Oxygen (3L) Activity Tolerance: Patient tolerated treatment well (on scale of 1-10 pt states SOB at end of ambulation is at a 5.) Patient left: in chair;with family/visitor present Nurse Communication: Mobility status PT Visit Diagnosis: Muscle weakness (generalized) (M62.81)    Time: 8867-7373 PT Time Calculation (min) (ACUTE ONLY): 25 min   Charges:   PT Evaluation $PT Eval Low Complexity: [place], PT CLT [phone] 08/14/2020, 10:45 AM

## 2020-08-15 ENCOUNTER — Encounter (HOSPITAL_COMMUNITY): Payer: Self-pay | Admitting: Internal Medicine

## 2020-08-15 DIAGNOSIS — I10 Essential (primary) hypertension: Secondary | ICD-10-CM | POA: Diagnosis not present

## 2020-08-15 DIAGNOSIS — J9601 Acute respiratory failure with hypoxia: Secondary | ICD-10-CM | POA: Diagnosis not present

## 2020-08-15 DIAGNOSIS — I5033 Acute on chronic diastolic (congestive) heart failure: Secondary | ICD-10-CM | POA: Diagnosis not present

## 2020-08-15 LAB — BASIC METABOLIC PANEL
Anion gap: 11 (ref 5–15)
BUN: 15 mg/dL (ref 8–23)
CO2: 29 mmol/L (ref 22–32)
Calcium: 8.7 mg/dL — ABNORMAL LOW (ref 8.9–10.3)
Chloride: 97 mmol/L — ABNORMAL LOW (ref 98–111)
Creatinine, Ser: 0.7 mg/dL (ref 0.44–1.00)
GFR, Estimated: >60 mL/min (ref >60)
Glucose, Bld: 155 mg/dL — ABNORMAL HIGH (ref 70–99)
Potassium: 4.1 mmol/L (ref 3.5–5.1)
Sodium: 137 mmol/L (ref 135–145)

## 2020-08-15 LAB — GLUCOSE, CAPILLARY
Glucose-Capillary: 151 mg/dL — ABNORMAL HIGH (ref 70–99)
Glucose-Capillary: 252 mg/dL — ABNORMAL HIGH (ref 70–99)
Glucose-Capillary: 131 mg/dL — ABNORMAL HIGH (ref 70–99)
Glucose-Capillary: 191 mg/dL — ABNORMAL HIGH (ref 70–99)

## 2020-08-15 LAB — MAGNESIUM: Magnesium: 1.6 mg/dL — ABNORMAL LOW (ref 1.7–2.4)

## 2020-08-15 MED ORDER — MAGNESIUM SULFATE 2 GM/50ML IV SOLN
2.0000 g | Freq: Once | INTRAVENOUS | Status: AC
  Administered 2020-08-15: 2 g via INTRAVENOUS
  Filled 2020-08-15: qty 50

## 2020-08-15 MED ORDER — HYDRALAZINE HCL 25 MG PO TABS
50.0000 mg | ORAL_TABLET | Freq: Three times a day (TID) | ORAL | Status: DC
  Administered 2020-08-15 – 2020-08-16 (×4): 50 mg via ORAL
  Filled 2020-08-15 (×4): qty 2

## 2020-08-15 MED ORDER — FUROSEMIDE 40 MG PO TABS: 40.0000 mg | ORAL_TABLET | Freq: Every day | ORAL | Status: DC

## 2020-08-15 NOTE — Progress Notes (Signed)
PROGRESS NOTE  [name] QBV:[id number] DOB: 05-24-1935 DOA: 08/13/2020 PCP: Janora Norlander, DO  Brief History:  85 year old female with a history of hypertension, diabetes mellitus type 2, diastolic CHF, hyperlipidemia, GERD presenting with 3-day history of shortness of breath, increasing ankle edema and orthopnea.  She has had to sleep in a recliner for the past 2 weeks. The patient was recently admitted to the hospital from 07/10/2020 to 07/13/2020 when she was treated for acute on chronic diastolic CHF.  She was noted to have a COVID-19 infection at that time and completed 3 days of remdesivir.  The patient was sent home with furosemide 40 mg once daily.  Since discharge from the hospital, the patient endorses compliance with all her medications.  She denies any indiscretion with her fluid or dietary intake.  She does not have any fevers, chills, chest pain, nausea, vomiting, diarrhea, abdominal pain.  There is no cough no hemoptysis. In the emergency department, the patient was afebrile and hemodynamically stable with oxygen saturations 92 to 96% on 3 L.  The patient was started on IV furosemide for congestive heart failure.  Assessment/Plan: Acute respiratory failure with hypoxia -Secondary to pulmonary edema -Oxygen saturation 88% on room air -Currently stable on 3 L>>1.5L -Wean oxygen for saturation greater 92% -08/13/2020 CTA chest negative for PE, trace bilateral pleural effusion, chronic T3, T7, T12, L1 compression fractures.  Stable costal vertebral hypodensity suspicious for nerve sheath tumor  Acute on chronic diastolic CHF -Remains clinically fluid overloaded -Continue IV furosemide -Daily weights -Accurate I's and O's -07/11/2020 echo EF 70 to 75%, no WMA, severe elevated PASP 86 mmHg, moderate MR, mild TR  Uncontrolled diabetes mellitus type 2 with hyperglycemia -Start reduced dose Lantus -NovoLog sliding scale -Holding sitagliptin Metformin -Holding  glipizide -12/11/19 A1C--6.7 -08/14/20 A1C--7.4  Essential hypertension -Continue metoprolol tartrate and amlodipine -Restart hydralazine-->increase to 50 mg tid -Continue lisinopril -Continue Imdur -Add hydralazine  Hyperlipidemia -Continue statin  GERD Continue PPI  Hypomagnesemia -replete      Status is: Inpatient  Remains inpatient appropriate because:IV treatments appropriate due to intensity of illness or inability to take PO   Dispo: The patient is from: Home  Anticipated d/c is to: Home  Patient currently is not medically stable to d/c.              Difficult to place patient No        Family Communication:  significant other updated at bedside 08/15/20  Consultants:  none Code Status:  FULL   DVT Prophylaxis:   [place] Lovenox   Procedures: As Listed in Progress Note Above  Antibiotics: None     Subjective: Patient is breathing better, but still has some dyspnea on exertion.  Denies f/c, cp, n/v/d, abd pain  Objective: Vitals:   08/14/20 1323 08/14/20 2113 08/15/20 0500 08/15/20 0540  BP: (!) 165/72 (!) 146/90  (!) 151/93  Pulse: 85 86  70  Resp: (!) 21 20  20   Temp: 97.8 F (36.6 C) 97.8 F (36.6 C)  98.6 F (37 C)  TempSrc: Oral Oral  Oral  SpO2: 96% 98%  100%  Weight:   56.9 kg   Height:        Intake/Output Summary (Last 24 hours) at 08/15/2020 1118 Last data filed at 08/15/2020 0900 Gross per 24 hour  Intake 720 ml  Output 600 ml  Net 120 ml   Weight change: 0.2 kg Exam:   General:  Pt is alert, follows commands appropriately, not in acute distress  HEENT: No icterus, No thrush, No neck mass, [place]/AT  Cardiovascular: RRR, S1/S2, no rubs, no gallops+PACs  Respiratory: bibasilar crackles. No wheeze  Abdomen: Soft/+BS, non tender, non distended, no guarding  Extremities: trace LE edema, No lymphangitis, No petechiae, No rashes, no synovitis   Data Reviewed: I have  personally reviewed following labs and imaging studies Basic Metabolic Panel: Recent Labs  Lab 08/13/20 1549 08/14/20 0620 08/15/20 0641  NA 135 136 137  K 4.2 3.9 4.1  CL 98 98 97*  CO2 23 25 29   GLUCOSE 161* 181* 155*  BUN 21 14 15   CREATININE 0.88 0.64 0.70  CALCIUM 8.8* 8.6* 8.7*  MG 1.3* 1.9 1.6*  PHOS  --  2.7  --    Liver Function Tests: Recent Labs  Lab 08/13/20 1549  AST 35  ALT 25  ALKPHOS 112  BILITOT 0.5  PROT 7.2  ALBUMIN 3.3*   No results for input(s): LIPASE, AMYLASE in the last 168 hours. No results for input(s): AMMONIA in the last 168 hours. Coagulation Profile: Recent Labs  Lab 08/13/20 1549 08/14/20 0620  INR 1.1 1.1   CBC: Recent Labs  Lab 08/13/20 1549 08/14/20 0620  WBC 8.3 8.0  HGB 10.3* 10.9*  HCT 34.0* 36.3  MCV 87.2 85.6  PLT 412* 426*   Cardiac Enzymes: No results for input(s): CKTOTAL, CKMB, CKMBINDEX, TROPONINI in the last 168 hours. BNP: Invalid input(s): POCBNP CBG: Recent Labs  Lab 08/14/20 1120 08/14/20 1611 08/14/20 2117 08/15/20 0728 08/15/20 1109  GLUCAP 213* 165* 147* 151* 252*   HbA1C: Recent Labs    08/14/20 0620  HGBA1C 7.4*   Urine analysis:    Component Value Date/Time   APPEARANCEUR Clear 09/29/2019 0906   GLUCOSEU Negative 09/29/2019 0906   BILIRUBINUR Negative 09/29/2019 0906   PROTEINUR 2+ (A) 09/29/2019 0906   NITRITE Negative 09/29/2019 0906   LEUKOCYTESUR 2+ (A) 09/29/2019 0906   Sepsis Labs: @LABRCNTIP (procalcitonin:4,lacticidven:4) )No results found for this or any previous visit (from the past 240 hour(s)).   Scheduled Meds: . aspirin  81 mg Oral Daily  . atorvastatin  10 mg Oral Daily  . enoxaparin (LOVENOX) injection  40 mg Subcutaneous Q24H  . feeding supplement (GLUCERNA SHAKE)  237 mL Oral TID BM  . furosemide  40 mg Intravenous Q12H  . hydrALAZINE  25 mg Oral Q8H  . insulin aspart  0-5 Units Subcutaneous QHS  . insulin aspart  0-9 Units Subcutaneous TID WC  . insulin  glargine  5 Units Subcutaneous Daily  . isosorbide mononitrate  30 mg Oral Daily  . latanoprost  1 drop Both Eyes QHS  . lisinopril  40 mg Oral Daily  . metoprolol tartrate  75 mg Oral BID  . pantoprazole  40 mg Oral Daily  . potassium chloride  20 mEq Oral Daily  . timolol  1 drop Both Eyes Daily   Continuous Infusions:  Procedures/Studies: CT Angio Chest PE W and/or Wo Contrast  Result Date: 08/13/2020 CLINICAL DATA:  Shortness of breath x2 days. EXAM: CT ANGIOGRAPHY CHEST WITH CONTRAST TECHNIQUE: Multidetector CT imaging of the chest was performed using the standard protocol during bolus administration of intravenous contrast. Multiplanar CT image reconstructions and MIPs were obtained to evaluate the vascular anatomy. CONTRAST:  150mL OMNIPAQUE IOHEXOL 350 MG/ML SOLN COMPARISON:  July 10, 2020 FINDINGS: Cardiovascular: There is moderate to marked severity calcification of the thoracic aorta, without evidence of aneurysmal dilatation or  dissection. Satisfactory opacification of the pulmonary arteries to the segmental level. No evidence of pulmonary embolism. Normal heart size. No pericardial effusion. Mediastinum/Nodes: There is mild pretracheal and AP window lymphadenopathy. Thyroid gland, trachea, and esophagus demonstrate no significant findings. Lungs/Pleura: Mild atelectasis is seen within the bilateral lower lobes. A trace amount of pleural fluid is seen, bilaterally. There is no evidence of an acute infiltrate or pneumothorax. Upper Abdomen: No acute abnormality. Musculoskeletal: Chronic posterolateral seventh and eighth left rib fractures are seen. Chronic compression fracture deformities are seen at the levels of T3, T7, T12 and L1. A stable approximately 2.1 cm x 1.7 cm hypodense lesion is seen at the T1-T2 costovertebral junction on the left (axial CT image 9, CT series number 4) with a 2.6 cm x 2.0 cm hypodense lesion seen at the costovertebral junction on the right (axial CT image 48, CT  series number 4). A similar appearing area is seen at this level on the right (axial CT image 13, CT series number 4). There is exaggerated kyphosis of the thoracic spine with multilevel degenerative changes. Review of the MIP images confirms the above findings. IMPRESSION: 1. No evidence of pulmonary embolus. 2. Mild bilateral lower lobe atelectasis with a trace amount of bilateral pleural fluid. 3. Stable bilateral costovertebral junction hypodense lesions suspicious for nerve sheath tumors. MRI correlation is recommended. 4. Multiple chronic compression fracture deformities of the thoracic spine. Aortic Atherosclerosis (ICD10-I70.0). Electronically Signed   By: Virgina Norfolk M.D.   On: 08/13/2020 20:13   DG Chest Portable 1 View  Result Date: 08/13/2020 CLINICAL DATA:  Shortness of breath EXAM: PORTABLE CHEST 1 VIEW COMPARISON:  July 10, 2020 chest radiograph and chest CT FINDINGS: Shallow degree of inspiration. Mild bibasilar atelectasis. No edema or airspace opacity. Heart size and pulmonary vascularity are normal. No adenopathy. Patient kyphotic. Marked collapse of a midthoracic vertebral body noted on chest CT is not well seen by portable radiography. IMPRESSION: Shallow degree of inspiration. No edema or airspace opacity. Stable cardiac silhouette. Electronically Signed   By: Lowella Grip III M.D.   On: 08/13/2020 16:28    Orson Eva, DO  [facility]  If 7PM-7AM, please contact night-coverage [email or web address] Password [email or web address] 08/15/2020, 11:18 AM   LOS: 2 days

## 2020-08-16 DIAGNOSIS — I5033 Acute on chronic diastolic (congestive) heart failure: Secondary | ICD-10-CM | POA: Diagnosis not present

## 2020-08-16 DIAGNOSIS — I10 Essential (primary) hypertension: Secondary | ICD-10-CM | POA: Diagnosis not present

## 2020-08-16 DIAGNOSIS — J9601 Acute respiratory failure with hypoxia: Secondary | ICD-10-CM | POA: Diagnosis not present

## 2020-08-16 LAB — BASIC METABOLIC PANEL
Anion gap: 13 (ref 5–15)
BUN: 18 mg/dL (ref 8–23)
CO2: 29 mmol/L (ref 22–32)
Calcium: 8.8 mg/dL — ABNORMAL LOW (ref 8.9–10.3)
Chloride: 93 mmol/L — ABNORMAL LOW (ref 98–111)
Creatinine, Ser: 0.78 mg/dL (ref 0.44–1.00)
GFR, Estimated: >60 mL/min (ref >60)
Glucose, Bld: 154 mg/dL — ABNORMAL HIGH (ref 70–99)
Potassium: 3.6 mmol/L (ref 3.5–5.1)
Sodium: 135 mmol/L (ref 135–145)

## 2020-08-16 LAB — MAGNESIUM: Magnesium: 1.7 mg/dL (ref 1.7–2.4)

## 2020-08-16 LAB — GLUCOSE, CAPILLARY
Glucose-Capillary: 188 mg/dL — ABNORMAL HIGH (ref 70–99)
Glucose-Capillary: 324 mg/dL — ABNORMAL HIGH (ref 70–99)

## 2020-08-16 MED ORDER — TORSEMIDE 20 MG PO TABS: 20.0000 mg | ORAL_TABLET | Freq: Two times a day (BID) | ORAL | Status: DC

## 2020-08-16 MED ORDER — MAGNESIUM OXIDE -MG SUPPLEMENT 400 (240 MG) MG PO TABS: 1.0000 | ORAL_TABLET | Freq: Every day | ORAL | 1 refills | Status: AC

## 2020-08-16 MED ORDER — TORSEMIDE 20 MG PO TABS
40.0000 mg | ORAL_TABLET | Freq: Every day | ORAL | Status: DC
  Administered 2020-08-16: 40 mg via ORAL
  Filled 2020-08-16: qty 2

## 2020-08-16 MED ORDER — TORSEMIDE 20 MG PO TABS: 40.0000 mg | ORAL_TABLET | Freq: Every day | ORAL | 1 refills | Status: DC

## 2020-08-16 NOTE — Progress Notes (Signed)
Physical Therapy Treatment Patient Details Name: [name] MRN: [id number] DOB: 12-Aug-1935 Today's Date: 08/16/2020    History of Present Illness [name] is an 85 y.o. female with medical history significant for hypertension, glaucoma, GERD, diabetes mellitus type 2, CHF and COVID-19 last month who presents to the emergency department due to 2 to 3 days of worsening shortness of breath.  Shortness of breath was mainly exertional, she denies increased weight gain, cough, fever, chills, chest pain, nausea or vomiting.  Patient states that she sleeps with head of bed inclined chronically.  Shortness of breath appears to improve at rest.  Patient was admitted from 3/5-3/8 due to acute on chronic diastolic CHF, OMAYO-45 infection and hypertensive emergency.    PT Comments    Therapist anticipated pt to be discharged but she has not, therefore ambulation completed without O2, pt able to ambulate 220 ft with no shortness of breath.  PT will be discharged from skilled PT.    Follow Up Recommendations  No PT follow up     Equipment Recommendations  None recommended by PT       Precautions / Restrictions Precautions Precautions: None    Mobility  Bed Mobility Overal bed mobility: Modified Independent                  Transfers Overall transfer level: Independent Equipment used: Rolling walker (2 wheeled)                Ambulation/Gait Ambulation/Gait assistance: Modified independent (Device/Increase time) Gait Distance (Feet): 220 Feet Assistive device: Rolling walker (2 wheeled) Gait Pattern/deviations: WFL(Within Functional Limits)   Gait velocity interpretation: 1.31 - 2.62 ft/sec, indicative of limited community Metallurgist Rankin (Stroke Patients Only)          Cognition Arousal/Alertness: Awake/alert Behavior During Therapy: WFL for tasks assessed/performed Overall Cognitive Status:  Within Functional Limits for tasks assessed                                               Pertinent Vitals/Pain Pain Assessment: No/denies pain           PT Goals (current goals can now be found in the care plan section) Acute Rehab PT Goals Patient Stated Goal: To go home PT Goal Formulation: With patient Time For Goal Achievement: 08/16/20 Potential to Achieve Goals: Good Progress towards PT goals: Goals met/education completed, patient discharged from PT       PT Plan  Discharge       End of Session Equipment Utilized During Treatment: Gait belt Activity Tolerance: Patient tolerated treatment well Patient left: in chair;with family/visitor present Nurse Communication: Mobility status PT Visit Diagnosis: Muscle weakness (generalized) (M62.81)     Time: 9977-4142 PT Time Calculation (min) (ACUTE ONLY): 30 min  Charges:  $Gait Training: 8-22 mins                        Rayetta Humphrey, PT CLT [phone] 08/16/2020, 9:12 AM

## 2020-08-16 NOTE — Care Management Important Message (Signed)
Important Message  Patient Details  Name: [name] MRN: [id number] Date of Birth: 12-Apr-1936   Medicare Important Message Given:  Yes     Tommy Medal 08/16/2020, 2:42 PM

## 2020-08-16 NOTE — Discharge Summary (Signed)
Physician Discharge Summary  [name] AJO:[id number] DOB: 1935/06/25 DOA: 08/13/2020  PCP: Janora Norlander, DO  Admit date: 08/13/2020 Discharge date: 08/16/2020  Admitted From: Home Disposition:  Home   Recommendations for Outpatient Follow-up:  1. Follow up with PCP in 1-2 weeks 2. Please obtain BMP/CBC in one week   Discharge Condition: Stable CODE STATUS: FULL Diet recommendation: Heart Healthy / Carb Modified   Brief/Interim Summary: 85 year old female with a history of hypertension, diabetes mellitus type 2, diastolic CHF, hyperlipidemia, GERD presenting with 3-day history of shortness of breath, increasing ankle edema and orthopnea.She has had to sleep in a recliner for the past 2 weeks.The patient was recently admitted to the hospital from 07/10/2020 to 07/13/2020 when she was treated for acute on chronic diastolic CHF. She was noted to have a COVID-19 infection at that time and completed 3 days of remdesivir. The patient was sent home with furosemide 40 mg once daily. Since discharge from the hospital, the patient endorses compliance with all her medications.She denies any indiscretion with her fluid or dietary intake. She does not have any fevers, chills, chest pain, nausea, vomiting, diarrhea, abdominal pain. There is no cough no hemoptysis. In the emergency department, the patient was afebrile and hemodynamically stable with oxygen saturations 92 to 96% on 3 L. The patient was started on IV furosemide for congestive heart failure.  Discharge Diagnoses:  Acute respiratory failure with hypoxia -Secondary to pulmonary edema -Oxygen saturation 88% on room air -Currently stable on 3 L>>1.5L>>RA -Wean oxygen for saturation greater 92% -08/13/2020 CTA chest negative for PE, trace bilateral pleural effusion, chronic T3, T7, T12, L1 compression fractures. Stable costal vertebral hypodensity suspicious for nerve sheath tumor -ambulatory pulse ox on RA on day of d/c did  not show any desaturation  Acute on chronic diastolic CHF -Remains clinically fluid overloaded -Continue IV furosemide>>d/c home with po torsemide 40 mg po daily (previously on lasix 40 mg po bid) -Daily weights -Accurate I's and O's -07/11/2020 echo EF 70 to 75%, no WMA, severe elevated PASP 15mmHg, moderate MR, mild TR  Uncontrolled diabetes mellitus type 2 with hyperglycemia -Started reduced dose Lantus during hospitalization -NovoLog sliding scale -Holding sitagliptin Metformin--restart after d/c -Holding glipizide--restart after d/c -12/11/19 A1C--6.7 -08/14/20 A1C--7.4  Essential hypertension -Continue metoprolol tartrateand amlodipine -Restart hydralazine-->increase to 50 mg tid -Continue lisinopril -Continue Imdur -restart hydralazine  Hyperlipidemia -Continue statin  GERD Continue PPI  Hypomagnesemia -replete -start daily mag ox 400 mg daily    Discharge Instructions   Allergies as of 08/16/2020      Reactions   Codeine Nausea Only      Medication List    STOP taking these medications   furosemide 40 MG tablet Commonly known as: LASIX   ibandronate 150 MG tablet Commonly known as: BONIVA     TAKE these medications   albuterol 108 (90 Base) MCG/ACT inhaler Commonly known as: VENTOLIN HFA Inhale 2 puffs into the lungs every 6 (six) hours as needed for wheezing or shortness of breath.   amLODipine 10 MG tablet Commonly known as: NORVASC Take 1 tablet (10 mg total) by mouth daily.   aspirin 81 MG tablet Take 81 mg by mouth daily.   atorvastatin 20 MG tablet Commonly known as: LIPITOR Take 0.5 tablets (10 mg total) by mouth daily.   calcium-vitamin D 500-200 MG-UNIT tablet Commonly known as: OSCAL WITH D Take 1 tablet by mouth.   Dexilant 60 MG capsule Generic drug: dexlansoprazole Take 1 capsule by mouth daily.  fluticasone 50 MCG/ACT nasal spray Commonly known as: FLONASE Place into both nostrils daily.   glipiZIDE 10 MG  tablet Commonly known as: GLUCOTROL TAKE 1 TABLET BY MOUTH ONCE DAILY BEFORE BREAKFAST   hydrALAZINE 25 MG tablet Commonly known as: APRESOLINE Take 3 tablets (75 mg total) by mouth every 8 (eight) hours. What changed: when to take this   isosorbide mononitrate 30 MG 24 hr tablet Commonly known as: IMDUR Take 1 tablet (30 mg total) by mouth daily.   Janumet 50-1000 MG tablet Generic drug: sitaGLIPtin-metformin TAKE 1 TABLET BY MOUTH TWICE DAILY WITH A MEAL   Lantus SoloStar 100 UNIT/ML Solostar Pen Generic drug: insulin glargine INJECT 16 UNITS SUBCUTANEOUSLY AT BEDTIME What changed: See the new instructions.   latanoprost 0.005 % ophthalmic solution Commonly known as: XALATAN 1 drop at bedtime.   lisinopril 40 MG tablet Commonly known as: ZESTRIL Take 1 tablet by mouth once daily What changed: when to take this   Lumbar Back Brace/Support Pad Misc Velcro closing back brace.  Use as needed for back pain.  Use no more than 1 time per week.   Magnesium Oxide 400 (240 Mg) MG Tabs Take 1 tablet (400 mg total) by mouth daily.   metoprolol tartrate 50 MG tablet Commonly known as: LOPRESSOR Take 1 tablet (50 mg total) by mouth 2 (two) times daily.   multivitamin-iron-minerals-folic acid chewable tablet Chew 1 tablet by mouth daily.   OneTouch Ultra test strip Generic drug: glucose blood Test BS twice daily Dx E11.9   potassium chloride 10 MEQ tablet Commonly known as: KLOR-CON Take 1 tablet (10 mEq total) by mouth daily.   timolol 0.5 % ophthalmic solution Commonly known as: TIMOPTIC Place 1 drop into both eyes daily.   torsemide 20 MG tablet Commonly known as: DEMADEX Take 2 tablets (40 mg total) by mouth daily. Start taking on: August 17, 2020   traZODone 50 MG tablet Commonly known as: DESYREL Take 0.5-1 tablets (25-50 mg total) by mouth at bedtime as needed for sleep.       Allergies  Allergen Reactions  . Codeine Nausea Only     Consultations:  none   Procedures/Studies: CT Angio Chest PE W and/or Wo Contrast  Result Date: 08/13/2020 CLINICAL DATA:  Shortness of breath x2 days. EXAM: CT ANGIOGRAPHY CHEST WITH CONTRAST TECHNIQUE: Multidetector CT imaging of the chest was performed using the standard protocol during bolus administration of intravenous contrast. Multiplanar CT image reconstructions and MIPs were obtained to evaluate the vascular anatomy. CONTRAST:  129mL OMNIPAQUE IOHEXOL 350 MG/ML SOLN COMPARISON:  July 10, 2020 FINDINGS: Cardiovascular: There is moderate to marked severity calcification of the thoracic aorta, without evidence of aneurysmal dilatation or dissection. Satisfactory opacification of the pulmonary arteries to the segmental level. No evidence of pulmonary embolism. Normal heart size. No pericardial effusion. Mediastinum/Nodes: There is mild pretracheal and AP window lymphadenopathy. Thyroid gland, trachea, and esophagus demonstrate no significant findings. Lungs/Pleura: Mild atelectasis is seen within the bilateral lower lobes. A trace amount of pleural fluid is seen, bilaterally. There is no evidence of an acute infiltrate or pneumothorax. Upper Abdomen: No acute abnormality. Musculoskeletal: Chronic posterolateral seventh and eighth left rib fractures are seen. Chronic compression fracture deformities are seen at the levels of T3, T7, T12 and L1. A stable approximately 2.1 cm x 1.7 cm hypodense lesion is seen at the T1-T2 costovertebral junction on the left (axial CT image 9, CT series number 4) with a 2.6 cm x 2.0 cm hypodense lesion  seen at the costovertebral junction on the right (axial CT image 48, CT series number 4). A similar appearing area is seen at this level on the right (axial CT image 13, CT series number 4). There is exaggerated kyphosis of the thoracic spine with multilevel degenerative changes. Review of the MIP images confirms the above findings. IMPRESSION: 1. No evidence of  pulmonary embolus. 2. Mild bilateral lower lobe atelectasis with a trace amount of bilateral pleural fluid. 3. Stable bilateral costovertebral junction hypodense lesions suspicious for nerve sheath tumors. MRI correlation is recommended. 4. Multiple chronic compression fracture deformities of the thoracic spine. Aortic Atherosclerosis (ICD10-I70.0). Electronically Signed   By: Virgina Norfolk M.D.   On: 08/13/2020 20:13   DG Chest Portable 1 View  Result Date: 08/13/2020 CLINICAL DATA:  Shortness of breath EXAM: PORTABLE CHEST 1 VIEW COMPARISON:  July 10, 2020 chest radiograph and chest CT FINDINGS: Shallow degree of inspiration. Mild bibasilar atelectasis. No edema or airspace opacity. Heart size and pulmonary vascularity are normal. No adenopathy. Patient kyphotic. Marked collapse of a midthoracic vertebral body noted on chest CT is not well seen by portable radiography. IMPRESSION: Shallow degree of inspiration. No edema or airspace opacity. Stable cardiac silhouette. Electronically Signed   By: Lowella Grip III M.D.   On: 08/13/2020 16:28         Discharge Exam: Vitals:   08/16/20 0525 08/16/20 0940  BP: (!) 157/74 (!) 172/61  Pulse: 68 61  Resp: 20   Temp: 97.8 F (36.6 C)   SpO2: 93%    Vitals:   08/15/20 1321 08/15/20 2027 08/16/20 0525 08/16/20 0940  BP: (!) 148/86 (!) 191/76 (!) 157/74 (!) 172/61  Pulse: 74 74 68 61  Resp: 19 20 20    Temp: 98.7 F (37.1 C) (!) 97.5 F (36.4 C) 97.8 F (36.6 C)   TempSrc: Oral Oral Oral   SpO2: 96% 93% 93%   Weight:   55.8 kg   Height:        General: Pt is alert, awake, not in acute distress Cardiovascular: RRR, S1/S2 +, no rubs, no gallops Respiratory: bibasilar crackles. No wheeze Abdominal: Soft, NT, ND, bowel sounds + Extremities: no edema, no cyanosis   The results of significant diagnostics from this hospitalization (including imaging, microbiology, ancillary and laboratory) are listed below for reference.     Significant Diagnostic Studies: CT Angio Chest PE W and/or Wo Contrast  Result Date: 08/13/2020 CLINICAL DATA:  Shortness of breath x2 days. EXAM: CT ANGIOGRAPHY CHEST WITH CONTRAST TECHNIQUE: Multidetector CT imaging of the chest was performed using the standard protocol during bolus administration of intravenous contrast. Multiplanar CT image reconstructions and MIPs were obtained to evaluate the vascular anatomy. CONTRAST:  139mL OMNIPAQUE IOHEXOL 350 MG/ML SOLN COMPARISON:  July 10, 2020 FINDINGS: Cardiovascular: There is moderate to marked severity calcification of the thoracic aorta, without evidence of aneurysmal dilatation or dissection. Satisfactory opacification of the pulmonary arteries to the segmental level. No evidence of pulmonary embolism. Normal heart size. No pericardial effusion. Mediastinum/Nodes: There is mild pretracheal and AP window lymphadenopathy. Thyroid gland, trachea, and esophagus demonstrate no significant findings. Lungs/Pleura: Mild atelectasis is seen within the bilateral lower lobes. A trace amount of pleural fluid is seen, bilaterally. There is no evidence of an acute infiltrate or pneumothorax. Upper Abdomen: No acute abnormality. Musculoskeletal: Chronic posterolateral seventh and eighth left rib fractures are seen. Chronic compression fracture deformities are seen at the levels of T3, T7, T12 and L1. A stable approximately  2.1 cm x 1.7 cm hypodense lesion is seen at the T1-T2 costovertebral junction on the left (axial CT image 9, CT series number 4) with a 2.6 cm x 2.0 cm hypodense lesion seen at the costovertebral junction on the right (axial CT image 48, CT series number 4). A similar appearing area is seen at this level on the right (axial CT image 13, CT series number 4). There is exaggerated kyphosis of the thoracic spine with multilevel degenerative changes. Review of the MIP images confirms the above findings. IMPRESSION: 1. No evidence of pulmonary embolus. 2.  Mild bilateral lower lobe atelectasis with a trace amount of bilateral pleural fluid. 3. Stable bilateral costovertebral junction hypodense lesions suspicious for nerve sheath tumors. MRI correlation is recommended. 4. Multiple chronic compression fracture deformities of the thoracic spine. Aortic Atherosclerosis (ICD10-I70.0). Electronically Signed   By: Virgina Norfolk M.D.   On: 08/13/2020 20:13   DG Chest Portable 1 View  Result Date: 08/13/2020 CLINICAL DATA:  Shortness of breath EXAM: PORTABLE CHEST 1 VIEW COMPARISON:  July 10, 2020 chest radiograph and chest CT FINDINGS: Shallow degree of inspiration. Mild bibasilar atelectasis. No edema or airspace opacity. Heart size and pulmonary vascularity are normal. No adenopathy. Patient kyphotic. Marked collapse of a midthoracic vertebral body noted on chest CT is not well seen by portable radiography. IMPRESSION: Shallow degree of inspiration. No edema or airspace opacity. Stable cardiac silhouette. Electronically Signed   By: Lowella Grip III M.D.   On: 08/13/2020 16:28     Microbiology: No results found for this or any previous visit (from the past 240 hour(s)).   Labs: Basic Metabolic Panel: Recent Labs  Lab 08/13/20 1549 08/14/20 0620 08/15/20 0641 08/16/20 0540  NA 135 136 137 135  K 4.2 3.9 4.1 3.6  CL 98 98 97* 93*  CO2 23 25 29 29   GLUCOSE 161* 181* 155* 154*  BUN 21 14 15 18   CREATININE 0.88 0.64 0.70 0.78  CALCIUM 8.8* 8.6* 8.7* 8.8*  MG 1.3* 1.9 1.6* 1.7  PHOS  --  2.7  --   --    Liver Function Tests: Recent Labs  Lab 08/13/20 1549  AST 35  ALT 25  ALKPHOS 112  BILITOT 0.5  PROT 7.2  ALBUMIN 3.3*   No results for input(s): LIPASE, AMYLASE in the last 168 hours. No results for input(s): AMMONIA in the last 168 hours. CBC: Recent Labs  Lab 08/13/20 1549 08/14/20 0620  WBC 8.3 8.0  HGB 10.3* 10.9*  HCT 34.0* 36.3  MCV 87.2 85.6  PLT 412* 426*   Cardiac Enzymes: No results for input(s): CKTOTAL,  CKMB, CKMBINDEX, TROPONINI in the last 168 hours. BNP: Invalid input(s): POCBNP CBG: Recent Labs  Lab 08/15/20 0728 08/15/20 1109 08/15/20 1606 08/15/20 2030 08/16/20 0737  GLUCAP 151* 252* 131* 191* 188*    Time coordinating discharge:  36 minutes  Signed:  Orson Eva, DO [facility] Pager: [phone] 08/16/2020, 10:48 AM

## 2020-08-17 ENCOUNTER — Other Ambulatory Visit: Payer: Self-pay

## 2020-08-17 ENCOUNTER — Telehealth: Payer: Self-pay

## 2020-08-17 DIAGNOSIS — I5033 Acute on chronic diastolic (congestive) heart failure: Secondary | ICD-10-CM

## 2020-08-17 NOTE — Telephone Encounter (Signed)
Transition Care Management Follow-up Telephone Call  Date of discharge and from where: 08/16/20 [facility]  How have you been since you were released from the hospital? Much better today - breathing is good  Any questions or concerns? No  Items Reviewed:  Did the pt receive and understand the discharge instructions provided? Yes   Medications obtained and verified? Yes   Other? No   Any new allergies since your discharge? No   Dietary orders reviewed? Yes  Do you have support at home? Yes   Home Care and Equipment/Supplies: Were home health services ordered? no  Were any new equipment or medical supplies ordered?  No  Functional Questionnaire: (I = Independent and D = Dependent) ADLs: I  Bathing/Dressing- I  Meal Prep- I  Eating- I  Maintaining continence- I  Transferring/Ambulation- I - USING WALKER  Managing Meds- I  Follow up appointments reviewed:   PCP Hospital f/u appt confirmed? Yes  Scheduled to see [facility] on 4/18 @ 2:15.  [facility] f/u appt confirmed? No    Are transportation arrangements needed? No   If their condition worsens, is the pt aware to call PCP or go to the Emergency Dept.? Yes  Was the patient provided with contact information for the PCP's office or ED? Yes  Was to pt encouraged to call back with questions or concerns? Yes

## 2020-08-23 ENCOUNTER — Encounter: Payer: Self-pay | Admitting: Family Medicine

## 2020-08-23 ENCOUNTER — Ambulatory Visit (INDEPENDENT_AMBULATORY_CARE_PROVIDER_SITE_OTHER): Payer: Medicare Other | Admitting: Family Medicine

## 2020-08-23 ENCOUNTER — Other Ambulatory Visit: Payer: Self-pay

## 2020-08-23 VITALS — BP 186/63 | HR 57 | Temp 97.9°F | Ht 60.0 in | Wt 122.2 lb

## 2020-08-23 DIAGNOSIS — Z09 Encounter for follow-up examination after completed treatment for conditions other than malignant neoplasm: Secondary | ICD-10-CM | POA: Diagnosis not present

## 2020-08-23 DIAGNOSIS — I5032 Chronic diastolic (congestive) heart failure: Secondary | ICD-10-CM

## 2020-08-23 DIAGNOSIS — I10 Essential (primary) hypertension: Secondary | ICD-10-CM | POA: Diagnosis not present

## 2020-08-23 NOTE — Progress Notes (Signed)
Subjective: CC: Acute respiratory failure PCP: Janora Norlander, DO [name] is a 85 y.o. female presenting to clinic today for:  1.  Acute respiratory failure secondary to heart failure exacerbation Patient initially required supplemental oxygen but was ultimately weaned down to room air by time of discharge.  She was noted to have pulmonary edema and was diuresed.  She was discharged on torsemide 40 mg daily.  She is here for interval follow-up.  Breathing has been good since discharge from the hospital.  She is been watching her weights very closely and she remains around 122 to 124 pounds.  She is compliant with the torsemide and all of her other antihypertensive medications.  Denies any chest pain, dizziness, headache, blurred vision.  She has follow-up with her cardiologist in 1 month.   ROS: Per HPI  Allergies  Allergen Reactions  . Codeine Nausea Only   Past Medical History:  Diagnosis Date  . Arthritis    OA  . CHF (congestive heart failure) ([place])   . DDD (degenerative disc disease), lumbosacral   . Diabetes mellitus without complication ([place])   . Distal radius fracture    bilateral  . GERD (gastroesophageal reflux disease)   . Glaucoma   . HOH (hard of hearing)    left ear  . Hypertension   . PONV (postoperative nausea and vomiting)     Current Outpatient Medications:  .  albuterol (VENTOLIN HFA) 108 (90 Base) MCG/ACT inhaler, Inhale 2 puffs into the lungs every 6 (six) hours as needed for wheezing or shortness of breath., Disp: , Rfl:  .  amLODipine (NORVASC) 10 MG tablet, Take 1 tablet (10 mg total) by mouth daily., Disp: 90 tablet, Rfl: 3 .  aspirin 81 MG tablet, Take 81 mg by mouth daily., Disp: , Rfl:  .  atorvastatin (LIPITOR) 20 MG tablet, Take 0.5 tablets (10 mg total) by mouth daily., Disp: 90 tablet, Rfl: 3 .  calcium-vitamin D (OSCAL WITH D) 500-200 MG-UNIT tablet, Take 1 tablet by mouth., Disp: , Rfl:  .  DEXILANT 60 MG capsule, Take 1  capsule by mouth daily., Disp: , Rfl:  .  Elastic Bandages & Supports (LUMBAR BACK BRACE/SUPPORT PAD) MISC, Velcro closing back brace.  Use as needed for back pain.  Use no more than 1 time per week., Disp: 1 each, Rfl: 0 .  fluticasone (FLONASE) 50 MCG/ACT nasal spray, Place into both nostrils daily., Disp: , Rfl:  .  glipiZIDE (GLUCOTROL) 10 MG tablet, TAKE 1 TABLET BY MOUTH ONCE DAILY BEFORE BREAKFAST, Disp: 90 tablet, Rfl: 3 .  glucose blood (ONETOUCH ULTRA) test strip, Test BS twice daily Dx E11.9, Disp: 200 each, Rfl: 3 .  hydrALAZINE (APRESOLINE) 25 MG tablet, Take 3 tablets (75 mg total) by mouth every 8 (eight) hours. (Patient taking differently: Take 75 mg by mouth daily.), Disp: 90 tablet, Rfl: 1 .  isosorbide mononitrate (IMDUR) 30 MG 24 hr tablet, Take 1 tablet (30 mg total) by mouth daily., Disp: 30 tablet, Rfl: 2 .  LANTUS SOLOSTAR 100 UNIT/ML Solostar Pen, INJECT 16 UNITS SUBCUTANEOUSLY AT BEDTIME (Patient taking differently: Inject 16 Units into the skin at bedtime.), Disp: 15 mL, Rfl: 0 .  latanoprost (XALATAN) 0.005 % ophthalmic solution, 1 drop at bedtime., Disp: , Rfl:  .  lisinopril (ZESTRIL) 40 MG tablet, Take 1 tablet by mouth once daily (Patient taking differently: Take 40 mg by mouth 2 (two) times daily.), Disp: 90 tablet, Rfl: 0 .  Magnesium Oxide  400 (240 Mg) MG TABS, Take 1 tablet (400 mg total) by mouth daily., Disp: 30 tablet, Rfl: 1 .  metoprolol tartrate (LOPRESSOR) 50 MG tablet, Take 1 tablet (50 mg total) by mouth 2 (two) times daily., Disp: 180 tablet, Rfl: 3 .  multivitamin-iron-minerals-folic acid (CENTRUM) chewable tablet, Chew 1 tablet by mouth daily., Disp: , Rfl:  .  potassium chloride (KLOR-CON) 10 MEQ tablet, Take 1 tablet (10 mEq total) by mouth daily., Disp: 30 tablet, Rfl: 1 .  sitaGLIPtin-metformin (JANUMET) 50-1000 MG tablet, TAKE 1 TABLET BY MOUTH TWICE DAILY WITH A MEAL, Disp: 180 tablet, Rfl: 3 .  timolol (TIMOPTIC) 0.5 % ophthalmic solution, Place 1  drop into both eyes daily., Disp: , Rfl:  .  torsemide (DEMADEX) 20 MG tablet, Take 2 tablets (40 mg total) by mouth daily., Disp: 60 tablet, Rfl: 1 .  traZODone (DESYREL) 50 MG tablet, Take 0.5-1 tablets (25-50 mg total) by mouth at bedtime as needed for sleep., Disp: 30 tablet, Rfl: 3 Social History   Socioeconomic History  . Marital status: Widowed    Spouse name: Not on file  . Number of children: 0  . Years of education: 7  . Highest education level: 7th grade  Occupational History  . Not on file  Tobacco Use  . Smoking status: Never Smoker  . Smokeless tobacco: Never Used  Vaping Use  . Vaping Use: Never used  Substance and Sexual Activity  . Alcohol use: No  . Drug use: No  . Sexual activity: Not Currently  Other Topics Concern  . Not on file  Social History Narrative  . Not on file   Social Determinants of Health   Financial Resource Strain: Not on file  Food Insecurity: Not on file  Transportation Needs: Not on file  Physical Activity: Not on file  Stress: Not on file  Social Connections: Not on file  Intimate Partner Violence: Not on file   Family History  Problem Relation Age of Onset  . Heart disease Mother   . Heart disease Father     Objective: Office vital signs reviewed. BP (!) 186/63   Pulse (!) 57   Temp 97.9 F (36.6 C)   Ht 5' (1.524 m)   Wt 122 lb 3.2 oz (55.4 kg)   SpO2 98%   BMI 23.87 kg/m   Physical Examination:  General: Awake, alert, well appearing elderly female, No acute distress Cardio: regular rate and rhythm, S1S2 heard, no murmurs appreciated Pulm: clear to auscultation bilaterally, no wheezes, rhonchi or rales; normal work of breathing on room air Extremities: warm, well perfused, trace ankle edema, no cyanosis or clubbing   Assessment/ Plan: 85 y.o. female   Hospital discharge follow-up - Plan: Basic Metabolic Panel, CBC  Chronic diastolic heart failure (HCC) - Plan: Basic Metabolic Panel, CBC  Accelerated  hypertension - Plan: Ambulatory referral to [facility] - [facility] [place]  I reviewed her hospital summary, discharge summary and recommendations.  Her blood pressure is persistently elevated.  In fact I had referred her to the hypertension clinic back in February but I believe that she was hospitalized prior to getting an appointment there.  She is continued on Norvasc, hydralazine, Imdur, lisinopril, metoprolol and Demadex as well as potassium; yet her blood pressure still stays quite elevated.  Would really like some assistance with this patient as I worry about ongoing damage to her heart and further exacerbations of her heart failure.  Recheck BMP, CBC.  No orders of  the defined types were placed in this encounter.  No orders of the defined types were placed in this encounter.   Today's visit is for Transitional Care Management.  The patient was discharged from [facility] on 08/16/2020 with a primary diagnosis of Acute respiratory failure.   Contact with the patient and/or caregiver, by a clinical staff member, was made on 08/17/20 and was documented as a telephone encounter within the EMR.  Through chart review and discussion with the patient I have determined that management of their condition is of moderate complexity.    Janora Norlander, DO [facility] [phone]

## 2020-08-24 ENCOUNTER — Telehealth: Payer: Self-pay | Admitting: *Deleted

## 2020-08-24 LAB — BASIC METABOLIC PANEL
BUN/Creatinine Ratio: 27 (ref 12–28)
BUN: 24 mg/dL (ref 8–27)
CO2: 23 mmol/L (ref 20–29)
Calcium: 9.8 mg/dL (ref 8.7–10.3)
Chloride: 93 mmol/L — ABNORMAL LOW (ref 96–106)
Creatinine, Ser: 0.88 mg/dL (ref 0.57–1.00)
Glucose: 112 mg/dL — ABNORMAL HIGH (ref 65–99)
Potassium: 4.8 mmol/L (ref 3.5–5.2)
Sodium: 139 mmol/L (ref 134–144)
eGFR: 66 mL/min/{1.73_m2} (ref >59)

## 2020-08-24 LAB — CBC
Hematocrit: 41.9 % (ref 34.0–46.6)
Hemoglobin: 12.8 g/dL (ref 11.1–15.9)
MCH: 25.8 pg — ABNORMAL LOW (ref 26.6–33.0)
MCHC: 30.5 g/dL — ABNORMAL LOW (ref 31.5–35.7)
MCV: 84 fL (ref 79–97)
Platelets: 439 10*3/uL (ref 150–450)
RBC: 4.97 x10E6/uL (ref 3.77–5.28)
RDW: 14.3 % (ref 11.7–15.4)
WBC: 8.8 10*3/uL (ref 3.4–10.8)

## 2020-08-24 NOTE — Chronic Care Management (AMB) (Signed)
  Chronic Care Management   Note  08/24/2020 Name: [name] MRN: [id number] DOB: May 09, 1935  [name] is a 85 y.o. year old female who is a primary care patient of Janora Norlander, DO. I reached out to [name] by phone today in response to a referral sent by Ms. Leilani Able Demma's PCP, Janora Norlander, DO.      Ms. Lazenby was given information about Chronic Care Management services today including:  1. CCM service includes personalized support from designated clinical staff supervised by her physician, including individualized plan of care and coordination with other care providers 2. 24/7 contact phone numbers for assistance for urgent and routine care needs. 3. Service will only be billed when office clinical staff spend 20 minutes or more in a month to coordinate care. 4. Only one practitioner may furnish and bill the service in a calendar month. 5. The patient may stop CCM services at any time (effective at the end of the month) by phone call to the office staff. 6. The patient will be responsible for cost sharing (co-pay) of up to 20% of the service fee (after annual deductible is met).  Patient did not agree to enrollment in care management services and does not wish to consider at this time.  Follow up plan: Patient declines engagement by the care management team. Appropriate care team members and provider have been notified via electronic communication.  The care management team is available to follow up with the patient after provider conversation with the patient regarding recommendation for care management engagement and subsequent re-referral to the care management team.   [facility]  Direct Dial: [phone]

## 2020-09-03 ENCOUNTER — Ambulatory Visit (INDEPENDENT_AMBULATORY_CARE_PROVIDER_SITE_OTHER): Payer: Medicare Other

## 2020-09-03 VITALS — Ht 60.0 in | Wt 123.0 lb

## 2020-09-03 DIAGNOSIS — Z0000 Encounter for general adult medical examination without abnormal findings: Secondary | ICD-10-CM

## 2020-09-03 NOTE — Patient Instructions (Signed)
[name] , Thank you for taking time to come for your Medicare Wellness Visit. I appreciate your ongoing commitment to your health goals. Please review the following plan we discussed and let me know if I can assist you in the future.   Screening recommendations/referrals: Colonoscopy: No longer required Mammogram: Done 12/02/2018 - No longer required, but still recommended once per year Bone Density: Done 12/25/2017 - Repeat every 2 years Recommended yearly ophthalmology/optometry visit for glaucoma screening and checkup Recommended yearly dental visit for hygiene and checkup  Vaccinations: Influenza vaccine: Done 03/11/2020 - repeat every fall Pneumococcal vaccine: Done 01/12/2015 & 06/12/2016 Tdap vaccine: Done 09/17/2017 - Repeat every 10 years Shingles vaccine: Zostavax done 01/12/2015; Shingrix discussed. Please contact your pharmacy for coverage information.    Covid-19:Done 07/03/19 & 07/29/2019 (had boosters, we need dates)  Advanced directives: Please bring a copy of your health care power of attorney and living will to the office to be added to your chart at your convenience.  Conditions/risks identified: Aim for 30 minutes of exercise, walking and stretching each day, drink 6-8 glasses of water and eat lots of fruits and vegetables. Continue fall prevention.  Next appointment: Follow up in one year for your annual wellness visit    Preventive Care 65 Years and Older, Female Preventive care refers to lifestyle choices and visits with your health care provider that can promote health and wellness. What does preventive care include?  A yearly physical exam. This is also called an annual well check.  Dental exams once or twice a year.  Routine eye exams. Ask your health care provider how often you should have your eyes checked.  Personal lifestyle choices, including:  Daily care of your teeth and gums.  Regular physical activity.  Eating a healthy diet.  Avoiding tobacco and  drug use.  Limiting alcohol use.  Practicing safe sex.  Taking low-dose aspirin every day.  Taking vitamin and mineral supplements as recommended by your health care provider. What happens during an annual well check? The services and screenings done by your health care provider during your annual well check will depend on your age, overall health, lifestyle risk factors, and family history of disease. Counseling  Your health care provider may ask you questions about your:  Alcohol use.  Tobacco use.  Drug use.  Emotional well-being.  Home and relationship well-being.  Sexual activity.  Eating habits.  History of falls.  Memory and ability to understand (cognition).  Work and work Statistician.  Reproductive health. Screening  You may have the following tests or measurements:  Height, weight, and BMI.  Blood pressure.  Lipid and cholesterol levels. These may be checked every 5 years, or more frequently if you are over 57 years old.  Skin check.  Lung cancer screening. You may have this screening every year starting at age 82 if you have a 30-pack-year history of smoking and currently smoke or have quit within the past 15 years.  Fecal occult blood test (FOBT) of the stool. You may have this test every year starting at age 56.  Flexible sigmoidoscopy or colonoscopy. You may have a sigmoidoscopy every 5 years or a colonoscopy every 10 years starting at age 53.  Hepatitis C blood test.  Hepatitis B blood test.  Sexually transmitted disease (STD) testing.  Diabetes screening. This is done by checking your blood sugar (glucose) after you have not eaten for a while (fasting). You may have this done every 1-3 years.  Bone density scan.  This is done to screen for osteoporosis. You may have this done starting at age 80.  Mammogram. This may be done every 1-2 years. Talk to your health care provider about how often you should have regular mammograms. Talk with your  health care provider about your test results, treatment options, and if necessary, the need for more tests. Vaccines  Your health care provider may recommend certain vaccines, such as:  Influenza vaccine. This is recommended every year.  Tetanus, diphtheria, and acellular pertussis (Tdap, Td) vaccine. You may need a Td booster every 10 years.  Zoster vaccine. You may need this after age 20.  Pneumococcal 13-valent conjugate (PCV13) vaccine. One dose is recommended after age 60.  Pneumococcal polysaccharide (PPSV23) vaccine. One dose is recommended after age 70. Talk to your health care provider about which screenings and vaccines you need and how often you need them. This information is not intended to replace advice given to you by your health care provider. Make sure you discuss any questions you have with your health care provider. Document Released: 05/21/2015 Document Revised: 01/12/2016 Document Reviewed: 02/23/2015 Elsevier Interactive Patient Education  2017 [place] Prevention in the Home Falls can cause injuries. They can happen to people of all ages. There are many things you can do to make your home safe and to help prevent falls. What can I do on the outside of my home?  Regularly fix the edges of walkways and driveways and fix any cracks.  Remove anything that might make you trip as you walk through a door, such as a raised step or threshold.  Trim any bushes or trees on the path to your home.  Use bright outdoor lighting.  Clear any walking paths of anything that might make someone trip, such as rocks or tools.  Regularly check to see if handrails are loose or broken. Make sure that both sides of any steps have handrails.  Any raised decks and porches should have guardrails on the edges.  Have any leaves, snow, or ice cleared regularly.  Use sand or salt on walking paths during winter.  Clean up any spills in your garage right away. This includes oil  or grease spills. What can I do in the bathroom?  Use night lights.  Install grab bars by the toilet and in the tub and shower. Do not use towel bars as grab bars.  Use non-skid mats or decals in the tub or shower.  If you need to sit down in the shower, use a plastic, non-slip stool.  Keep the floor dry. Clean up any water that spills on the floor as soon as it happens.  Remove soap buildup in the tub or shower regularly.  Attach bath mats securely with double-sided non-slip rug tape.  Do not have throw rugs and other things on the floor that can make you trip. What can I do in the bedroom?  Use night lights.  Make sure that you have a light by your bed that is easy to reach.  Do not use any sheets or blankets that are too big for your bed. They should not hang down onto the floor.  Have a firm chair that has side arms. You can use this for support while you get dressed.  Do not have throw rugs and other things on the floor that can make you trip. What can I do in the kitchen?  Clean up any spills right away.  Avoid walking on wet floors.  Keep items that you use a lot in easy-to-reach places.  If you need to reach something above you, use a strong step stool that has a grab bar.  Keep electrical cords out of the way.  Do not use floor polish or wax that makes floors slippery. If you must use wax, use non-skid floor wax.  Do not have throw rugs and other things on the floor that can make you trip. What can I do with my stairs?  Do not leave any items on the stairs.  Make sure that there are handrails on both sides of the stairs and use them. Fix handrails that are broken or loose. Make sure that handrails are as long as the stairways.  Check any carpeting to make sure that it is firmly attached to the stairs. Fix any carpet that is loose or worn.  Avoid having throw rugs at the top or bottom of the stairs. If you do have throw rugs, attach them to the floor with  carpet tape.  Make sure that you have a light switch at the top of the stairs and the bottom of the stairs. If you do not have them, ask someone to add them for you. What else can I do to help prevent falls?  Wear shoes that:  Do not have high heels.  Have rubber bottoms.  Are comfortable and fit you well.  Are closed at the toe. Do not wear sandals.  If you use a stepladder:  Make sure that it is fully opened. Do not climb a closed stepladder.  Make sure that both sides of the stepladder are locked into place.  Ask someone to hold it for you, if possible.  Clearly mark and make sure that you can see:  Any grab bars or handrails.  First and last steps.  Where the edge of each step is.  Use tools that help you move around (mobility aids) if they are needed. These include:  Canes.  Walkers.  Scooters.  Crutches.  Turn on the lights when you go into a dark area. Replace any light bulbs as soon as they burn out.  Set up your furniture so you have a clear path. Avoid moving your furniture around.  If any of your floors are uneven, fix them.  If there are any pets around you, be aware of where they are.  Review your medicines with your doctor. Some medicines can make you feel dizzy. This can increase your chance of falling. Ask your doctor what other things that you can do to help prevent falls. This information is not intended to replace advice given to you by your health care provider. Make sure you discuss any questions you have with your health care provider. Document Released: 02/18/2009 Document Revised: 09/30/2015 Document Reviewed: 05/29/2014 Elsevier Interactive Patient Education  2017 [employer].

## 2020-09-03 NOTE — Progress Notes (Signed)
Subjective:   [name] is a 85 y.o. female who presents for Medicare Annual (Subsequent) preventive examination.  Virtual Visit via Telephone Note  I connected with  [name] on 09/03/20 at  3:30 PM EDT by telephone and verified that I am speaking with the correct person using two identifiers.  Location: Patient: Home Provider: [facility] Persons participating in the virtual visit: patient/Nurse Health Advisor   I discussed the limitations, risks, security and privacy concerns of performing an evaluation and management service by telephone and the availability of in person appointments. The patient expressed understanding and agreed to proceed.  Interactive audio and video telecommunications were attempted between this nurse and patient, however failed, due to patient having technical difficulties OR patient did not have access to video capability.  We continued and completed visit with audio only.  Some vital signs may be absent or patient reported.   [name] Kelly Mahasin Riviere, LPN   Review of Systems     Cardiac Risk Factors include: advanced age (>6men, >79 women);diabetes mellitus;hypertension;sedentary lifestyle     Objective:    Today's Vitals   09/03/20 1319  Weight: 123 lb (55.8 kg)  Height: 5' (1.524 m)  PainSc: 0-No pain   Body mass index is 24.02 kg/m.  Advanced Directives 09/03/2020 08/14/2020 07/10/2020 12/11/2019 12/10/2019 07/18/2019 03/15/2016  Does Patient Have a Medical Advance Directive? No No No No No No No  Type of Advance Directive - - - - - - -  Does patient want to make changes to medical advance directive? - - - - - - -  Copy of [place] in Chart? - - - - - - -  Would patient like information on creating a medical advance directive? No - Patient declined No - Patient declined No - Patient declined No - Patient declined - Yes (MAU/Ambulatory/Procedural Areas - Information given) -    Current Medications (verified) Outpatient Encounter  Medications as of 09/03/2020  Medication Sig  . albuterol (VENTOLIN HFA) 108 (90 Base) MCG/ACT inhaler Inhale 2 puffs into the lungs every 6 (six) hours as needed for wheezing or shortness of breath.  [name] amLODipine (NORVASC) 10 MG tablet Take 1 tablet (10 mg total) by mouth daily.  [name] aspirin 81 MG tablet Take 81 mg by mouth daily.  [name] atorvastatin (LIPITOR) 20 MG tablet Take 0.5 tablets (10 mg total) by mouth daily.  . calcium-vitamin D (OSCAL WITH D) 500-200 MG-UNIT tablet Take 1 tablet by mouth.  . DEXILANT 60 MG capsule Take 1 capsule by mouth daily.  . Elastic Bandages & Supports (LUMBAR BACK BRACE/SUPPORT PAD) MISC Velcro closing back brace.  Use as needed for back pain.  Use no more than 1 time per week.  . fluticasone (FLONASE) 50 MCG/ACT nasal spray Place into both nostrils daily.  [name] glipiZIDE (GLUCOTROL) 10 MG tablet TAKE 1 TABLET BY MOUTH ONCE DAILY BEFORE BREAKFAST  . glucose blood (ONETOUCH ULTRA) test strip Test BS twice daily Dx E11.9  . hydrALAZINE (APRESOLINE) 25 MG tablet Take 3 tablets (75 mg total) by mouth every 8 (eight) hours. (Patient taking differently: Take 75 mg by mouth daily.)  . isosorbide mononitrate (IMDUR) 30 MG 24 hr tablet Take 1 tablet (30 mg total) by mouth daily.  [name] LANTUS SOLOSTAR 100 UNIT/ML Solostar Pen INJECT 16 UNITS SUBCUTANEOUSLY AT BEDTIME (Patient taking differently: Inject 16 Units into the skin at bedtime.)  . latanoprost (XALATAN) 0.005 % ophthalmic solution 1 drop at bedtime.  [name] lisinopril (  ZESTRIL) 40 MG tablet Take 1 tablet by mouth once daily (Patient taking differently: Take 40 mg by mouth 2 (two) times daily.)  . magnesium oxide (MAG-OX) 400 (241.3 Mg) MG tablet Take 1 tablet by mouth daily.  . Magnesium Oxide 400 (240 Mg) MG TABS Take 1 tablet (400 mg total) by mouth daily.  . metoprolol tartrate (LOPRESSOR) 50 MG tablet Take 1 tablet (50 mg total) by mouth 2 (two) times daily.  . multivitamin-iron-minerals-folic acid (CENTRUM) chewable tablet  Chew 1 tablet by mouth daily.  . potassium chloride (KLOR-CON) 10 MEQ tablet Take 1 tablet (10 mEq total) by mouth daily.  . sitaGLIPtin-metformin (JANUMET) 50-1000 MG tablet TAKE 1 TABLET BY MOUTH TWICE DAILY WITH A MEAL  . timolol (TIMOPTIC) 0.5 % ophthalmic solution Place 1 drop into both eyes daily.  [name] torsemide (DEMADEX) 20 MG tablet Take 2 tablets (40 mg total) by mouth daily.  . traZODone (DESYREL) 50 MG tablet Take 0.5-1 tablets (25-50 mg total) by mouth at bedtime as needed for sleep.   No facility-administered encounter medications on file as of 09/03/2020.    Allergies (verified) Codeine   History: Past Medical History:  Diagnosis Date  . Arthritis    OA  . CHF (congestive heart failure) ([place])   . DDD (degenerative disc disease), lumbosacral   . Diabetes mellitus without complication ([place])   . Distal radius fracture    bilateral  . GERD (gastroesophageal reflux disease)   . Glaucoma   . HOH (hard of hearing)    left ear  . Hypertension   . PONV (postoperative nausea and vomiting)    Past Surgical History:  Procedure Laterality Date  . ABDOMINAL HYSTERECTOMY    . BREAST BIOPSY    . BREAST EXCISIONAL BIOPSY Left    benign  . BREAST SURGERY     left milk duct   . EYE SURGERY     cataract  . FRACTURE SURGERY Left    hip fracture  . OPEN REDUCTION INTERNAL FIXATION (ORIF) DISTAL RADIAL FRACTURE Bilateral 05/24/2015   Procedure: OPEN REDUCTION INTERNAL FIXATION (ORIF) BILATERAL DISTAL RADIAL FRACTURE VOLAR PLATE;  Surgeon: Daryll Brod, MD;  Location: [facility];  Service: Orthopedics;  Laterality: Bilateral;   Family History  Problem Relation Age of Onset  . Heart disease Mother   . Heart disease Father    Social History   Socioeconomic History  . Marital status: Widowed    Spouse name: Not on file  . Number of children: 0  . Years of education: 7  . Highest education level: 7th grade  Occupational History  . Not on file  Tobacco Use  .  Smoking status: Never Smoker  . Smokeless tobacco: Never Used  Vaping Use  . Vaping Use: Never used  Substance and Sexual Activity  . Alcohol use: No  . Drug use: No  . Sexual activity: Not Currently  Other Topics Concern  . Not on file  Social History Narrative   Lives alone - no children - has 3 sisters who all live nearby   Social Determinants of Health   Financial Resource Strain: Low Risk   . Difficulty of Paying Living Expenses: Not hard at all  Food Insecurity: No Food Insecurity  . Worried About Charity fundraiser in the Last Year: Never true  . Ran Out of Food in the Last Year: Never true  Transportation Needs: No Transportation Needs  . Lack of Transportation (Medical): No  . Lack of  Transportation (Non-Medical): No  Physical Activity: Insufficiently Active  . Days of Exercise per Week: 7 days  . Minutes of Exercise per Session: 20 min  Stress: No Stress Concern Present  . Feeling of Stress : Not at all  Social Connections: Moderately Integrated  . Frequency of Communication with Friends and Family: More than three times a week  . Frequency of Social Gatherings with Friends and Family: More than three times a week  . Attends Religious Services: More than 4 times per year  . Active Member of Clubs or Organizations: Yes  . Attends Archivist Meetings: More than 4 times per year  . Marital Status: Widowed    Tobacco Counseling Counseling given: Not Answered   Clinical Intake:  Pre-visit preparation completed: Yes  Pain : No/denies pain Pain Score: 0-No pain     BMI - recorded: 24.02 Nutritional Status: BMI of 19-24  Normal Nutritional Risks: None Diabetes: Yes CBG done?: No Did pt. bring in CBG monitor from home?: No  How often do you need to have someone help you when you read instructions, pamphlets, or other written materials from your doctor or pharmacy?: 1 - Never  Nutrition Risk Assessment:  Has the patient had any N/V/D within the  last 2 months?  No  Does the patient have any non-healing wounds?  No  Has the patient had any unintentional weight loss or weight gain?  No   Diabetes:  Is the patient diabetic?  Yes  If diabetic, was a CBG obtained today?  No  Did the patient bring in their glucometer from home?  No  How often do you monitor your CBG's? BID - 109 this am per patient.   Financial Strains and Diabetes Management:  Are you having any financial strains with the device, your supplies or your medication? No .  Does the patient want to be seen by Chronic Care Management for management of their diabetes?  No  Would the patient like to be referred to a Nutritionist or for Diabetic Management?  No   Diabetic Exams:  Diabetic Eye Exam: Completed 2021. t.  Diabetic Foot Exam: Completed 09/01/2019. Pt has been advised about the importance in completing this exam. Pt is scheduled for diabetic foot exam on 09/22/20.    Interpreter Needed?: No  Information entered by :: Porfirio Bollier, LPN   Activities of Daily Living In your present state of health, do you have any difficulty performing the following activities: 09/03/2020 08/14/2020  Hearing? Tempie Donning  Vision? N Y  Difficulty concentrating or making decisions? N N  Walking or climbing stairs? Y Y  Dressing or bathing? N Y  Doing errands, shopping? N Y  Conservation officer, nature and eating ? N -  Using the Toilet? N -  In the past six months, have you accidently leaked urine? N -  Do you have problems with loss of bowel control? N -  Managing your Medications? N -  Managing your Finances? N -  Housekeeping or managing your Housekeeping? N -  Some recent data might be hidden    Patient Care Team: Janora Norlander, DO as PCP - General (Family Medicine) Josue Hector, MD as PCP - Cardiology (Cardiology)  Indicate any recent Medical Services you may have received from other than [facility] providers in the past year (date may be approximate).     Assessment:   This is a  routine wellness examination for [facility].  Hearing/Vision screen  Hearing Screening   125Hz   250Hz  500Hz  1000Hz  2000Hz  3000Hz  4000Hz  6000Hz  8000Hz   Right ear:           Left ear:           Comments: Wears hearing aids from [facility] in [place], [place]  Vision Screening Comments: Wears eyeglasses - Annual eye exams with Dr Marin Comment in [place] - up to date with eye exam  Dietary issues and exercise activities discussed: Current Exercise Habits: Home exercise routine, Type of exercise: walking;stretching, Time (Minutes): 30, Frequency (Times/Week): 7, Weekly Exercise (Minutes/Week): 210, Intensity: Mild, Exercise limited by: cardiac condition(s);respiratory conditions(s)  Goals    .  Acknowledge receipt of Advanced Directive package     Look over and discuss Advance Directive packet and complete    . Prevent falls     Goals Addressed             This Visit's Progress   .  Acknowledge receipt of Advanced Directive package   Not on track    Look over and discuss Advance Directive packet and complete    . Prevent falls   On track            Depression Screen PHQ 2/9 Scores 09/03/2020 08/23/2020 07/23/2020 06/30/2020 06/28/2020 06/02/2020 03/05/2020  PHQ - 2 Score 0 0 0 0 0 0 0  PHQ- 9 Score 2 0 0 - 0 0 -    Fall Risk Fall Risk  09/03/2020 08/23/2020 07/23/2020 06/30/2020 06/28/2020  Falls in the past year? 0 0 1 1 1   Number falls in past yr: 0 - 1 0 0  Injury with Fall? 0 - 1 0 0  Risk for fall due to : Impaired balance/gait;Impaired vision;Medication side effect;Orthopedic patient - History of fall(s);Impaired balance/gait History of fall(s) History of fall(s)  Follow up Education provided;Falls prevention discussed - - Falls evaluation completed Falls prevention discussed    FALL RISK PREVENTION PERTAINING TO THE HOME:  Any stairs in or around the home? No  If so, are there any without handrails? No  Home free of loose throw rugs in walkways, pet beds, electrical cords, etc? Yes   Adequate lighting in your home to reduce risk of falls? Yes   ASSISTIVE DEVICES UTILIZED TO PREVENT FALLS:  Life alert? No  Use of a cane, walker or w/c? Yes  Grab bars in the bathroom? Yes  Shower chair or bench in shower? Yes  Elevated toilet seat or a handicapped toilet? Yes   TIMED UP AND GO:  Was the test performed? No .  Telephonic visit  Cognitive Function: Normal cognitive status assessed by direct observation by this Nurse Health Advisor. No abnormalities found.      6CIT Screen 07/18/2019  What Year? 0 points  What month? 0 points  What time? 0 points  Count back from 20 0 points  Months in reverse 0 points  Repeat phrase 0 points  Total Score 0    Immunizations Immunization History  Administered Date(s) Administered  . Fluad Quad(high Dose 65+) 02/12/2019  . Influenza Split 02/07/2016, 02/08/2017  . Influenza, High Dose Seasonal PF 01/12/2015, 02/18/2018  . Influenza,inj,Quad PF,6+ Mos 02/20/2018  . Influenza,trivalent, recombinat, inj, PF 02/17/2014, 02/08/2017  . Influenza-Unspecified 03/11/2020  . Moderna Sars-Covid-2 Vaccination 07/03/2019, 07/29/2019  . Pneumococcal Conjugate-13 01/12/2015  . Pneumococcal Polysaccharide-23 06/12/2016  . Tdap 06/11/2003, 06/12/2003, 09/17/2017  . Zoster 01/12/2015    TDAP status: Up to date  Flu Vaccine status: Up to date  Pneumococcal vaccine status: Up to date  Covid-19  vaccine status: Completed vaccines  Qualifies for Shingles Vaccine? Yes   Zostavax completed Yes   Shingrix Completed?: No.    Education has been provided regarding the importance of this vaccine. Patient has been advised to call insurance company to determine out of pocket expense if they have not yet received this vaccine. Advised may also receive vaccine at local pharmacy or Health Dept. Verbalized acceptance and understanding.  Screening Tests Health Maintenance  Topic Date Due  . OPHTHALMOLOGY EXAM  Never done  . COVID-19 Vaccine (3 -  Booster for Moderna series) 01/29/2020  . FOOT EXAM  08/31/2020  . INFLUENZA VACCINE  12/06/2020  . HEMOGLOBIN A1C  02/13/2021  . TETANUS/TDAP  09/18/2027  . DEXA SCAN  Completed  . PNA vac Low Risk Adult  Completed  . HPV VACCINES  Aged Out    Health Maintenance  Health Maintenance Due  Topic Date Due  . OPHTHALMOLOGY EXAM  Never done  . COVID-19 Vaccine (3 - Booster for Moderna series) 01/29/2020  . FOOT EXAM  08/31/2020    Colorectal cancer screening: No longer required.   Mammogram status: No longer required due to age.  Bone Density status: Completed 12/25/2017. Results reflect: Bone density results: OSTEOPOROSIS. Repeat every 2 years.  Lung Cancer Screening: (Low Dose CT Chest recommended if Age 32-80 years, 30 pack-year currently smoking OR have quit w/in 15years.) does not qualify.   Additional Screening:  Hepatitis C Screening: does not qualify  Vision Screening: Recommended annual ophthalmology exams for early detection of glaucoma and other disorders of the eye. Is the patient up to date with their annual eye exam?  Yes  Who is the provider or what is the name of the office in which the patient attends annual eye exams? Dr Marin Comment in [place] If pt is not established with a provider, would they like to be referred to a provider to establish care? No .   Dental Screening: Recommended annual dental exams for proper oral hygiene  Community Resource Referral / Chronic Care Management: CRR required this visit?  No   CCM required this visit?  No      Plan:     I have personally reviewed and noted the following in the patient's chart:   . Medical and social history . Use of alcohol, tobacco or illicit drugs  . Current medications and supplements . Functional ability and status . Nutritional status . Physical activity . Advanced directives . List of other physicians . Hospitalizations, surgeries, and ER visits in previous 12 months . Vitals . Screenings to  include cognitive, depression, and falls . Referrals and appointments  In addition, I have reviewed and discussed with patient certain preventive protocols, quality metrics, and best practice recommendations. A written personalized care plan for preventive services as well as general preventive health recommendations were provided to patient.     Sandrea Hammond, LPN   11/02/3149   Nurse Notes: None

## 2020-09-08 NOTE — Progress Notes (Signed)
CARDIOLOGY CONSULT NOTE       Patient ID: MACKINSEY PELLAND MRN: [id number] DOB/AGE: 11-09-1935 85 y.o.  Primary Physician: Janora Norlander, DO Primary Cardiologist: Johnsie Cancel   HPI:  85 y.o. referred by Dr Lajuana Ripple on 09/22/59 for diastolic CHF Admitted to [facility] 12/11/19 with progressive dyspnea 48 hours associated with orthopnea Associated with dry cough, headaches and fatigue No chest pain or edema BP very elevated on presentation systolic 607 mmHg she was Rx with iv nitro and lasix WBC 16.4 troponin negative x 2 BNP only 379 COVID/Influenza negative CXR showed diffuse infiltrates consistent with pneumonitis   She is diabetic No history of cardiac issues Echo done 12/11/19 reviewed EF 55-60% moderate LVH grade 2 diastolic CHF mild MR MRA with no RAS and K 3.6 Cr 0.78  She lives alone with 4 sisters locally and a boyfriend   Seen in ED 04/02/20 with right rib fracture after a fall  Seen in ED 05/05/20 for dyspnea and edema Rx lasix CXR normal  Seen in ED 1/23 with LLE cellulitis with ankle pain and BP elevated Rx with Bactrim BNP 833  Has LE venous reflux that contributes to her edema    Hospitalized with covid and dyspnea with streroids and remdesivir March 2022 Readmitted 08/13/20 more orthopnea, edema and dyspnea CTA negative for PE Lasix changed to demadex 40 mg daily   TTE 07/11/20 EF 70-75% moderate LVH estimated PA pressure elevated 86 mmHg but Normal RV size and function   She has not been taking her Demedex ? Pharmacy had trouble getting Weight at home stable 123-125 lbs  Discussed changing imdur to revatio    ROS All other systems reviewed and negative except as noted above  Past Medical History:  Diagnosis Date  . Arthritis    OA  . CHF (congestive heart failure) ([place])   . DDD (degenerative disc disease), lumbosacral   . Diabetes mellitus without complication ([place])   . Distal radius fracture    bilateral  . GERD (gastroesophageal reflux disease)   . Glaucoma   .  HOH (hard of hearing)    left ear  . Hypertension   . PONV (postoperative nausea and vomiting)     Family History  Problem Relation Age of Onset  . Heart disease Mother   . Heart disease Father     Social History   Socioeconomic History  . Marital status: Widowed    Spouse name: Not on file  . Number of children: 0  . Years of education: 7  . Highest education level: 7th grade  Occupational History  . Not on file  Tobacco Use  . Smoking status: Never Smoker  . Smokeless tobacco: Never Used  Vaping Use  . Vaping Use: Never used  Substance and Sexual Activity  . Alcohol use: No  . Drug use: No  . Sexual activity: Not Currently  Other Topics Concern  . Not on file  Social History Narrative   Lives alone - no children - has 3 sisters who all live nearby   Social Determinants of Health   Financial Resource Strain: Low Risk   . Difficulty of Paying Living Expenses: Not hard at all  Food Insecurity: No Food Insecurity  . Worried About Charity fundraiser in the Last Year: Never true  . Ran Out of Food in the Last Year: Never true  Transportation Needs: No Transportation Needs  . Lack of Transportation (Medical): No  . Lack of Transportation (Non-Medical): No  Physical  Activity: Insufficiently Active  . Days of Exercise per Week: 7 days  . Minutes of Exercise per Session: 20 min  Stress: No Stress Concern Present  . Feeling of Stress : Not at all  Social Connections: Moderately Integrated  . Frequency of Communication with Friends and Family: More than three times a week  . Frequency of Social Gatherings with Friends and Family: More than three times a week  . Attends Religious Services: More than 4 times per year  . Active Member of Clubs or Organizations: Yes  . Attends Archivist Meetings: More than 4 times per year  . Marital Status: Widowed  Intimate Partner Violence: Not At Risk  . Fear of Current or Ex-Partner: No  . Emotionally Abused: No  .  Physically Abused: No  . Sexually Abused: No    Past Surgical History:  Procedure Laterality Date  . ABDOMINAL HYSTERECTOMY    . BREAST BIOPSY    . BREAST EXCISIONAL BIOPSY Left    benign  . BREAST SURGERY     left milk duct   . EYE SURGERY     cataract  . FRACTURE SURGERY Left    hip fracture  . OPEN REDUCTION INTERNAL FIXATION (ORIF) DISTAL RADIAL FRACTURE Bilateral 05/24/2015   Procedure: OPEN REDUCTION INTERNAL FIXATION (ORIF) BILATERAL DISTAL RADIAL FRACTURE VOLAR PLATE;  Surgeon: Daryll Brod, MD;  Location: [facility];  Service: Orthopedics;  Laterality: Bilateral;      Current Outpatient Medications:  .  albuterol (VENTOLIN HFA) 108 (90 Base) MCG/ACT inhaler, Inhale 2 puffs into the lungs every 6 (six) hours as needed for wheezing or shortness of breath., Disp: , Rfl:  .  amLODipine (NORVASC) 10 MG tablet, Take 1 tablet (10 mg total) by mouth daily., Disp: 90 tablet, Rfl: 3 .  aspirin 81 MG tablet, Take 81 mg by mouth daily., Disp: , Rfl:  .  atorvastatin (LIPITOR) 20 MG tablet, Take 0.5 tablets (10 mg total) by mouth daily., Disp: 90 tablet, Rfl: 3 .  calcium-vitamin D (OSCAL WITH D) 500-200 MG-UNIT tablet, Take 1 tablet by mouth., Disp: , Rfl:  .  DEXILANT 60 MG capsule, Take 1 capsule by mouth daily., Disp: , Rfl:  .  Elastic Bandages & Supports (LUMBAR BACK BRACE/SUPPORT PAD) MISC, Velcro closing back brace.  Use as needed for back pain.  Use no more than 1 time per week., Disp: 1 each, Rfl: 0 .  fluticasone (FLONASE) 50 MCG/ACT nasal spray, Place into both nostrils daily., Disp: , Rfl:  .  glipiZIDE (GLUCOTROL) 10 MG tablet, TAKE 1 TABLET BY MOUTH ONCE DAILY BEFORE BREAKFAST, Disp: 90 tablet, Rfl: 3 .  glucose blood (ONETOUCH ULTRA) test strip, Test BS twice daily Dx E11.9, Disp: 200 each, Rfl: 3 .  isosorbide mononitrate (IMDUR) 30 MG 24 hr tablet, Take 1 tablet (30 mg total) by mouth daily., Disp: 30 tablet, Rfl: 2 .  LANTUS SOLOSTAR 100 UNIT/ML Solostar  Pen, INJECT 16 UNITS SUBCUTANEOUSLY AT BEDTIME (Patient taking differently: Inject 16 Units into the skin at bedtime.), Disp: 15 mL, Rfl: 0 .  latanoprost (XALATAN) 0.005 % ophthalmic solution, 1 drop at bedtime., Disp: , Rfl:  .  lisinopril (ZESTRIL) 40 MG tablet, Take 1 tablet by mouth once daily (Patient taking differently: Take 40 mg by mouth 2 (two) times daily.), Disp: 90 tablet, Rfl: 0 .  magnesium oxide (MAG-OX) 400 (241.3 Mg) MG tablet, Take 1 tablet by mouth daily., Disp: , Rfl:  .  Magnesium Oxide  400 (240 Mg) MG TABS, Take 1 tablet (400 mg total) by mouth daily., Disp: 30 tablet, Rfl: 1 .  multivitamin-iron-minerals-folic acid (CENTRUM) chewable tablet, Chew 1 tablet by mouth daily., Disp: , Rfl:  .  potassium chloride (KLOR-CON) 10 MEQ tablet, Take 1 tablet (10 mEq total) by mouth daily., Disp: 30 tablet, Rfl: 1 .  sitaGLIPtin-metformin (JANUMET) 50-1000 MG tablet, TAKE 1 TABLET BY MOUTH TWICE DAILY WITH A MEAL, Disp: 180 tablet, Rfl: 3 .  timolol (TIMOPTIC) 0.5 % ophthalmic solution, Place 1 drop into both eyes daily., Disp: , Rfl:  .  traZODone (DESYREL) 50 MG tablet, Take 0.5-1 tablets (25-50 mg total) by mouth at bedtime as needed for sleep., Disp: 30 tablet, Rfl: 3    Physical Exam: Blood pressure (!) 192/75, pulse 77, height 5' (1.524 m), weight 55.3 kg, SpO2 95 %.    Affect appropriate Healthy:  appears stated age 87: normal Neck supple with no adenopathy JVP elevated  no bruits no thyromegaly Lungs clear with no wheezing and good diaphragmatic motion Heart:  S1/S2 no murmur, no rub, gallop or click PMI normal Abdomen: benighn, BS positve, no tenderness, no AAA no bruit.  No HSM or HJR Distal pulses intact with no bruits Neuro non-focal Varicosities with edema and mild erythema bilaterally  No muscular weakness   Labs:   Lab Results  Component Value Date   WBC 8.8 08/23/2020   HGB 12.8 08/23/2020   HCT 41.9 08/23/2020   MCV 84 08/23/2020   PLT 439  08/23/2020    No results for input(s): NA, K, CL, CO2, BUN, CREATININE, CALCIUM, PROT, BILITOT, ALKPHOS, ALT, AST, GLUCOSE in the last 168 hours.  Invalid input(s): LABALBU No results found for: CKTOTAL, CKMB, CKMBINDEX, TROPONINI  Lab Results  Component Value Date   CHOL 88 (L) 05/31/2020   Lab Results  Component Value Date   HDL 46 05/31/2020   Lab Results  Component Value Date   LDLCALC 27 05/31/2020   Lab Results  Component Value Date   TRIG 67 05/31/2020   Lab Results  Component Value Date   CHOLHDL 1.9 05/31/2020   No results found for: LDLDIRECT    Radiology: No results found.  EKG: SR rate 85 LAE diffuse ST depression LAD   ASSESSMENT AND PLAN:   1. HTN: Well controlled.  Continue current medications and low sodium Dash type diet.    2. Diastolic CHF:  Related to age and hypertensive urgency as well as type 2 DM  Continue demadex  3. DM:  Discussed low carb diet.  Target hemoglobin A1c is 6.5 or less.  Continue current medications. 4. HLD:  Continue statin  5. Edema:  Venous insufficiency unable to use compression stockings elevation/diuretic 6. Pulmonary HTN:  By most recent echo CTA negative for PE x 2 07/10/20 and 08/13/20 d/c imdur And start revatio 20 mg tid May benefit from CHF referral in future   Revatio 20 tid Demadex 40 mg daily D/C imdur CXR BMET/BNP   F/u 6-8 weeks  Signed: Jenkins Rouge 09/20/2020, 10:33 AM

## 2020-09-20 ENCOUNTER — Ambulatory Visit (HOSPITAL_COMMUNITY)
Admission: RE | Admit: 2020-09-20 | Discharge: 2020-09-20 | Disposition: A | Discharge status: Home / Self Care | Payer: Medicare Other | Source: Ambulatory Visit | Attending: Cardiovascular Disease | Admitting: Cardiovascular Disease

## 2020-09-20 ENCOUNTER — Encounter: Payer: Self-pay | Admitting: Cardiovascular Disease

## 2020-09-20 ENCOUNTER — Ambulatory Visit (INDEPENDENT_AMBULATORY_CARE_PROVIDER_SITE_OTHER): Payer: Medicare Other | Admitting: Cardiovascular Disease

## 2020-09-20 ENCOUNTER — Telehealth: Payer: Self-pay | Admitting: Cardiovascular Disease

## 2020-09-20 ENCOUNTER — Other Ambulatory Visit (HOSPITAL_COMMUNITY)
Admission: RE | Admit: 2020-09-20 | Discharge: 2020-09-20 | Disposition: A | Discharge status: Home / Self Care | Payer: Medicare Other | Source: Ambulatory Visit | Attending: Cardiovascular Disease | Admitting: Cardiovascular Disease

## 2020-09-20 ENCOUNTER — Other Ambulatory Visit: Payer: Self-pay

## 2020-09-20 VITALS — BP 192/75 | HR 77 | Ht 60.0 in | Wt 122.0 lb

## 2020-09-20 DIAGNOSIS — I272 Pulmonary hypertension, unspecified: Secondary | ICD-10-CM

## 2020-09-20 DIAGNOSIS — I5042 Chronic combined systolic (congestive) and diastolic (congestive) heart failure: Secondary | ICD-10-CM | POA: Diagnosis not present

## 2020-09-20 DIAGNOSIS — I700 Atherosclerosis of aorta: Secondary | ICD-10-CM | POA: Diagnosis not present

## 2020-09-20 DIAGNOSIS — M4854XA Collapsed vertebra, not elsewhere classified, thoracic region, initial encounter for fracture: Secondary | ICD-10-CM | POA: Diagnosis not present

## 2020-09-20 DIAGNOSIS — R918 Other nonspecific abnormal finding of lung field: Secondary | ICD-10-CM | POA: Diagnosis not present

## 2020-09-20 DIAGNOSIS — I509 Heart failure, unspecified: Secondary | ICD-10-CM | POA: Diagnosis not present

## 2020-09-20 LAB — BASIC METABOLIC PANEL
Anion gap: 11 (ref 5–15)
BUN: 23 mg/dL (ref 8–23)
CO2: 31 mmol/L (ref 22–32)
Calcium: 9.1 mg/dL (ref 8.9–10.3)
Chloride: 96 mmol/L — ABNORMAL LOW (ref 98–111)
Creatinine, Ser: 0.79 mg/dL (ref 0.44–1.00)
GFR, Estimated: >60 mL/min (ref >60)
Glucose, Bld: 63 mg/dL — ABNORMAL LOW (ref 70–99)
Potassium: 4.2 mmol/L (ref 3.5–5.1)
Sodium: 138 mmol/L (ref 135–145)

## 2020-09-20 LAB — BRAIN NATRIURETIC PEPTIDE: B Natriuretic Peptide: 511 pg/mL — ABNORMAL HIGH (ref 0.0–100.0)

## 2020-09-20 MED ORDER — ALBUTEROL SULFATE HFA 108 (90 BASE) MCG/ACT IN AERS
2.0000 | INHALATION_SPRAY | Freq: Four times a day (QID) | RESPIRATORY_TRACT | 2 refills | Status: DC | PRN
Start: 2020-09-20 — End: 2021-08-03

## 2020-09-20 MED ORDER — SILDENAFIL CITRATE 20 MG PO TABS: 20.0000 mg | ORAL_TABLET | Freq: Three times a day (TID) | ORAL | 11 refills | Status: DC

## 2020-09-20 NOTE — Telephone Encounter (Signed)
STOP Imdur  START Revatio 20 mg three times a day.Marland KitchenMarland KitchenMarland KitchenKatie [facility] [place] [place] [facility] said the Revatio and the Isosorbide, but that was prescribed by a different provider. I do not see on her med list. Please call to let her know what Dr. Johnsie Cancel wants to so. [phone]

## 2020-09-20 NOTE — Patient Instructions (Signed)
Medication Instructions:  STOP Imdur   START Revatio 20 mg three times a day  *If you need a refill on your cardiac medications before your next appointment, please call your pharmacy*   Lab Work: BMET, BNP today   If you have labs (blood work) drawn today and your tests are completely normal, you will receive your results only by: Marland Kitchen MyChart Message (if you have MyChart) OR . A paper copy in the mail If you have any lab test that is abnormal or we need to change your treatment, we will call you to review the results.   Testing/Procedures: Chest x-ray today    Follow-Up: At [facility], you and your health needs are our priority.  As part of our continuing mission to provide you with exceptional heart care, we have created designated Provider Care Teams.  These Care Teams include your primary Cardiologist (physician) and Advanced Practice Providers (APPs -  Physician Assistants and Nurse Practitioners) who all work together to provide you with the care you need, when you need it.  We recommend signing up for the patient portal called "MyChart".  Sign up information is provided on this After Visit Summary.  MyChart is used to connect with patients for Virtual Visits (Telemedicine).  Patients are able to view lab/test results, encounter notes, upcoming appointments, etc.  Non-urgent messages can be sent to your provider as well.   To learn more about what you can do with MyChart, go to [email or web address].    Your next appointment:   8 week(s)  The format for your next appointment:   In Person  Provider:   Jenkins Rouge, MD or B.Ahmed Prima, PA-C    Other Instructions None

## 2020-09-20 NOTE — Telephone Encounter (Signed)
Katie notified at [facility] that [facility] was stopped today.

## 2020-09-22 ENCOUNTER — Encounter: Payer: Self-pay | Admitting: Family Medicine

## 2020-09-22 ENCOUNTER — Ambulatory Visit (INDEPENDENT_AMBULATORY_CARE_PROVIDER_SITE_OTHER): Payer: Medicare Other | Admitting: Family Medicine

## 2020-09-22 ENCOUNTER — Other Ambulatory Visit: Payer: Self-pay

## 2020-09-22 VITALS — BP 162/62 | HR 61 | Temp 97.2°F | Ht 60.0 in | Wt 122.4 lb

## 2020-09-22 DIAGNOSIS — I152 Hypertension secondary to endocrine disorders: Secondary | ICD-10-CM | POA: Diagnosis not present

## 2020-09-22 DIAGNOSIS — E1159 Type 2 diabetes mellitus with other circulatory complications: Secondary | ICD-10-CM | POA: Diagnosis not present

## 2020-09-22 NOTE — Patient Instructions (Signed)
Home sugars are looking better.  Keep taking the medication that Dr Johnsie Cancel prescribed but make sure NO ISOSORBIDE.    See nurse in 2 weeks for blood pressure check.  Goal <150/90.

## 2020-09-22 NOTE — Progress Notes (Signed)
Subjective: CC: Follow-up hypertension PCP: Janora Norlander, DO [name] is a 85 y.o. female presenting to clinic today for:  1.  Hypertension Patient noted to have uncontrolled hypertension at our visit in April and then again at her cardiology visit a few days ago.  Her isosorbide was discontinued and she was transitioned over to Revatio 3 times daily.  So far she is tolerating this without difficulty.  She denies any headache.  She does report dyspnea on exertion but this seems to be a chronic issue for her.  No chest pain or swelling.  She is compliant with all other medications.   ROS: Per HPI  Allergies  Allergen Reactions  . Codeine Nausea Only   Past Medical History:  Diagnosis Date  . Arthritis    OA  . CHF (congestive heart failure) ([place])   . DDD (degenerative disc disease), lumbosacral   . Diabetes mellitus without complication ([place])   . Distal radius fracture    bilateral  . GERD (gastroesophageal reflux disease)   . Glaucoma   . HOH (hard of hearing)    left ear  . Hypertension   . PONV (postoperative nausea and vomiting)     Current Outpatient Medications:  .  albuterol (VENTOLIN HFA) 108 (90 Base) MCG/ACT inhaler, Inhale 2 puffs into the lungs every 6 (six) hours as needed for wheezing or shortness of breath., Disp: 1 each, Rfl: 2 .  amLODipine (NORVASC) 10 MG tablet, Take 1 tablet (10 mg total) by mouth daily., Disp: 90 tablet, Rfl: 3 .  aspirin 81 MG tablet, Take 81 mg by mouth daily., Disp: , Rfl:  .  atorvastatin (LIPITOR) 20 MG tablet, Take 0.5 tablets (10 mg total) by mouth daily., Disp: 90 tablet, Rfl: 3 .  calcium-vitamin D (OSCAL WITH D) 500-200 MG-UNIT tablet, Take 1 tablet by mouth., Disp: , Rfl:  .  DEXILANT 60 MG capsule, Take 1 capsule by mouth daily., Disp: , Rfl:  .  Elastic Bandages & Supports (LUMBAR BACK BRACE/SUPPORT PAD) MISC, Velcro closing back brace.  Use as needed for back pain.  Use no more than 1 time per week., Disp: 1  each, Rfl: 0 .  fluticasone (FLONASE) 50 MCG/ACT nasal spray, Place into both nostrils daily., Disp: , Rfl:  .  glipiZIDE (GLUCOTROL) 10 MG tablet, TAKE 1 TABLET BY MOUTH ONCE DAILY BEFORE BREAKFAST, Disp: 90 tablet, Rfl: 3 .  glucose blood (ONETOUCH ULTRA) test strip, Test BS twice daily Dx E11.9, Disp: 200 each, Rfl: 3 .  LANTUS SOLOSTAR 100 UNIT/ML Solostar Pen, INJECT 16 UNITS SUBCUTANEOUSLY AT BEDTIME (Patient taking differently: Inject 16 Units into the skin at bedtime.), Disp: 15 mL, Rfl: 0 .  latanoprost (XALATAN) 0.005 % ophthalmic solution, 1 drop at bedtime., Disp: , Rfl:  .  lisinopril (ZESTRIL) 40 MG tablet, Take 1 tablet by mouth once daily (Patient taking differently: Take 40 mg by mouth 2 (two) times daily.), Disp: 90 tablet, Rfl: 0 .  magnesium oxide (MAG-OX) 400 (241.3 Mg) MG tablet, Take 1 tablet by mouth daily., Disp: , Rfl:  .  Magnesium Oxide 400 (240 Mg) MG TABS, Take 1 tablet (400 mg total) by mouth daily., Disp: 30 tablet, Rfl: 1 .  multivitamin-iron-minerals-folic acid (CENTRUM) chewable tablet, Chew 1 tablet by mouth daily., Disp: , Rfl:  .  potassium chloride (KLOR-CON) 10 MEQ tablet, Take 1 tablet (10 mEq total) by mouth daily., Disp: 30 tablet, Rfl: 1 .  sildenafil (REVATIO) 20 MG tablet, Take  1 tablet (20 mg total) by mouth 3 (three) times daily., Disp: 90 tablet, Rfl: 11 .  sitaGLIPtin-metformin (JANUMET) 50-1000 MG tablet, TAKE 1 TABLET BY MOUTH TWICE DAILY WITH A MEAL, Disp: 180 tablet, Rfl: 3 .  timolol (TIMOPTIC) 0.5 % ophthalmic solution, Place 1 drop into both eyes daily., Disp: , Rfl:  .  torsemide (DEMADEX) 20 MG tablet, Take 40 mg by mouth daily., Disp: , Rfl:  .  traZODone (DESYREL) 50 MG tablet, Take 0.5-1 tablets (25-50 mg total) by mouth at bedtime as needed for sleep., Disp: 30 tablet, Rfl: 3 Social History   Socioeconomic History  . Marital status: Widowed    Spouse name: Not on file  . Number of children: 0  . Years of education: 7  . Highest  education level: 7th grade  Occupational History  . Not on file  Tobacco Use  . Smoking status: Never Smoker  . Smokeless tobacco: Never Used  Vaping Use  . Vaping Use: Never used  Substance and Sexual Activity  . Alcohol use: No  . Drug use: No  . Sexual activity: Not Currently  Other Topics Concern  . Not on file  Social History Narrative   Lives alone - no children - has 3 sisters who all live nearby   Social Determinants of Health   Financial Resource Strain: Low Risk   . Difficulty of Paying Living Expenses: Not hard at all  Food Insecurity: No Food Insecurity  . Worried About Charity fundraiser in the Last Year: Never true  . Ran Out of Food in the Last Year: Never true  Transportation Needs: No Transportation Needs  . Lack of Transportation (Medical): No  . Lack of Transportation (Non-Medical): No  Physical Activity: Insufficiently Active  . Days of Exercise per Week: 7 days  . Minutes of Exercise per Session: 20 min  Stress: No Stress Concern Present  . Feeling of Stress : Not at all  Social Connections: Moderately Integrated  . Frequency of Communication with Friends and Family: More than three times a week  . Frequency of Social Gatherings with Friends and Family: More than three times a week  . Attends Religious Services: More than 4 times per year  . Active Member of Clubs or Organizations: Yes  . Attends Archivist Meetings: More than 4 times per year  . Marital Status: Widowed  Intimate Partner Violence: Not At Risk  . Fear of Current or Ex-Partner: No  . Emotionally Abused: No  . Physically Abused: No  . Sexually Abused: No   Family History  Problem Relation Age of Onset  . Heart disease Mother   . Heart disease Father     Objective: Office vital signs reviewed. BP (!) 162/62   Pulse 61   Temp (!) 97.2 F (36.2 C)   Ht 5' (1.524 m)   Wt 122 lb 6.4 oz (55.5 kg)   SpO2 98%   BMI 23.90 kg/m   Physical Examination:  General: Awake,  alert, elderly female, No acute distress Cardio: regular rate and rhythm, S1S2 heard, no murmurs appreciated Pulm: clear to auscultation bilaterally, no wheezes, rhonchi or rales; normal work of breathing on room air MSK: Marked kyphosis of the thoracic spine.  Patient ambulating independently  Assessment/ Plan: 85 y.o. female   Hypertension associated with diabetes ([place])  Blood pressure is still not at goal but it is improving.  She has had 30 point reduction in her systolic since her visit with her  cardiologist a few days ago.  She is only been on the Revatio for less than 24 hours, so I am optimistic about how this may continue to impact her blood pressure.  I reiterated absolutely no isosorbide while on the Revatio.  She was able to verbalize this back and confirm this is no longer in her medication rotation.  I would like her to see the nurse in about 2 weeks for blood pressure recheck again.  This has been scheduled.  Diabetes check due in 3 months.  She will schedule that appointment and return at that time.  No orders of the defined types were placed in this encounter.  No orders of the defined types were placed in this encounter.    Janora Norlander, DO [place] [phone]

## 2020-10-06 ENCOUNTER — Other Ambulatory Visit: Payer: Self-pay

## 2020-10-06 ENCOUNTER — Ambulatory Visit: Payer: Medicare Other

## 2020-10-06 DIAGNOSIS — E1159 Type 2 diabetes mellitus with other circulatory complications: Secondary | ICD-10-CM

## 2020-10-06 NOTE — Progress Notes (Signed)
Patient is here today for recheck of her blood pressure.  Blood pressure is 187/71, pulse 64.  After sitting for a few minutes, we rechecked her blood pressure and it is 190/67, pulse 64.

## 2020-10-14 ENCOUNTER — Other Ambulatory Visit: Payer: Self-pay | Admitting: Family Medicine

## 2020-10-17 ENCOUNTER — Other Ambulatory Visit: Payer: Self-pay | Admitting: Family Medicine

## 2020-10-22 ENCOUNTER — Other Ambulatory Visit: Payer: Self-pay | Admitting: Family Medicine

## 2020-10-22 DIAGNOSIS — E1159 Type 2 diabetes mellitus with other circulatory complications: Secondary | ICD-10-CM

## 2020-10-22 DIAGNOSIS — E1129 Type 2 diabetes mellitus with other diabetic kidney complication: Secondary | ICD-10-CM

## 2020-10-28 NOTE — Progress Notes (Signed)
CARDIOLOGY CONSULT NOTE       Patient ID: [name] MRN: [id number] DOB/AGE: [birth year] 85 y.o.  Primary Physician: Janora Norlander, DO Primary Cardiologist: Johnsie Cancel   HPI:  85 y.o. referred by Dr Lajuana Ripple on 0/08/67 for diastolic CHF Admitted to [facility] 12/11/19 with progressive dyspnea 48 hours associated with orthopnea Associated with dry cough, headaches and fatigue No chest pain or edema BP very elevated on presentation systolic 619 mmHg she was Rx with iv nitro and lasix WBC 16.4 troponin negative x 2 BNP only 379 COVID/Influenza negative CXR showed diffuse infiltrates consistent with pneumonitis   She is diabetic No history of cardiac issues Echo done 12/11/19 reviewed EF 55-60% moderate LVH grade 2 diastolic CHF mild MR MRA with no RAS and K 3.6 Cr 0.78  She lives alone with 4 sisters locally and a boyfriend   Seen in ED 04/02/20 with right rib fracture after a fall  Seen in ED 05/05/20 for dyspnea and edema Rx lasix CXR normal  Seen in ED 1/23 with LLE cellulitis with ankle pain and BP elevated Rx with Bactrim BNP 833  Has LE venous reflux that contributes to her edema    Hospitalized with covid and dyspnea with streroids and remdesivir March 2022 Readmitted 08/13/20 more orthopnea, edema and dyspnea CTA negative for PE Lasix changed to demadex 40 mg daily   TTE 07/11/20 EF 70-75% moderate LVH estimated PA pressure elevated 86 mmHg but Normal RV size and function   She has not been taking her Demedex ? Pharmacy had trouble getting Weight at home stable 123-125 lbs  09/20/20 imdur changed to Revatio CXR 09/20/20 showed chronic lung changes with low volumes NAD  Doing well LE edema stable with no skin breakdown  Caring for a friend at her house with advanced metastatic lung CA Seems like hospice case     ROS All other systems reviewed and negative except as noted above  Past Medical History:  Diagnosis Date   Arthritis    OA   CHF (congestive heart failure) (HCC)     DDD (degenerative disc disease), lumbosacral    Diabetes mellitus without complication (HCC)    Distal radius fracture    bilateral   GERD (gastroesophageal reflux disease)    Glaucoma    HOH (hard of hearing)    left ear   Hypertension    PONV (postoperative nausea and vomiting)     Family History  Problem Relation Age of Onset   Heart disease Mother    Heart disease Father     Social History   Socioeconomic History   Marital status: Widowed    Spouse name: Not on file   Number of children: 0   Years of education: 7   Highest education level: 7th grade  Occupational History   Not on file  Tobacco Use   Smoking status: Never   Smokeless tobacco: Never  Vaping Use   Vaping Use: Never used  Substance and Sexual Activity   Alcohol use: No   Drug use: No   Sexual activity: Not Currently  Other Topics Concern   Not on file  Social History Narrative   Lives alone - no children - has 3 sisters who all live nearby   Social Determinants of Health   Financial Resource Strain: Low Risk    Difficulty of Paying Living Expenses: Not hard at all  Food Insecurity: No Food Insecurity   Worried About Charity fundraiser in the Last Year:  Never true   Ran Out of Food in the Last Year: Never true  Transportation Needs: No Transportation Needs   Lack of Transportation (Medical): No   Lack of Transportation (Non-Medical): No  Physical Activity: Insufficiently Active   Days of Exercise per Week: 7 days   Minutes of Exercise per Session: 20 min  Stress: No Stress Concern Present   Feeling of Stress : Not at all  Social Connections: Moderately Integrated   Frequency of Communication with Friends and Family: More than three times a week   Frequency of Social Gatherings with Friends and Family: More than three times a week   Attends Religious Services: More than 4 times per year   Active Member of [employer] or Organizations: Yes   Attends Archivist Meetings: More than 4  times per year   Marital Status: Widowed  Human resources officer Violence: Not At Risk   Fear of Current or Ex-Partner: No   Emotionally Abused: No   Physically Abused: No   Sexually Abused: No    Past Surgical History:  Procedure Laterality Date   ABDOMINAL HYSTERECTOMY     BREAST BIOPSY     BREAST EXCISIONAL BIOPSY Left    benign   BREAST SURGERY     left milk duct    EYE SURGERY     cataract   FRACTURE SURGERY Left    hip fracture   OPEN REDUCTION INTERNAL FIXATION (ORIF) DISTAL RADIAL FRACTURE Bilateral 05/24/2015   Procedure: OPEN REDUCTION INTERNAL FIXATION (ORIF) BILATERAL DISTAL RADIAL FRACTURE VOLAR PLATE;  Surgeon: Daryll Brod, MD;  Location: [facility];  Service: Orthopedics;  Laterality: Bilateral;      Current Outpatient Medications:    albuterol (VENTOLIN HFA) 108 (90 Base) MCG/ACT inhaler, Inhale 2 puffs into the lungs every 6 (six) hours as needed for wheezing or shortness of breath., Disp: 1 each, Rfl: 2   amLODipine (NORVASC) 10 MG tablet, Take 1 tablet (10 mg total) by mouth daily., Disp: 90 tablet, Rfl: 3   aspirin 81 MG tablet, Take 81 mg by mouth daily., Disp: , Rfl:    atorvastatin (LIPITOR) 20 MG tablet, Take 1 tablet by mouth once daily, Disp: 90 tablet, Rfl: 1   calcium-vitamin D (OSCAL WITH D) 500-200 MG-UNIT tablet, Take 1 tablet by mouth., Disp: , Rfl:    DEXILANT 60 MG capsule, Take 1 capsule by mouth daily., Disp: , Rfl:    Elastic Bandages & Supports (LUMBAR BACK BRACE/SUPPORT PAD) MISC, Velcro closing back brace.  Use as needed for back pain.  Use no more than 1 time per week., Disp: 1 each, Rfl: 0   fluticasone (FLONASE) 50 MCG/ACT nasal spray, Place into both nostrils daily., Disp: , Rfl:    glipiZIDE (GLUCOTROL) 10 MG tablet, TAKE 1 TABLET BY MOUTH ONCE DAILY BEFORE BREAKFAST, Disp: 90 tablet, Rfl: 3   glucose blood (ONETOUCH ULTRA) test strip, Test BS twice daily Dx E11.9, Disp: 200 each, Rfl: 3   JANUMET 50-1000 MG tablet, TAKE 1 TABLET  BY MOUTH TWICE DAILY WITH A MEAL, Disp: 180 tablet, Rfl: 0   LANTUS SOLOSTAR 100 UNIT/ML Solostar Pen, INJECT 16 UNITS SUBCUTANEOUSLY AT BEDTIME (Patient taking differently: Inject 16 Units into the skin at bedtime.), Disp: 15 mL, Rfl: 0   latanoprost (XALATAN) 0.005 % ophthalmic solution, 1 drop at bedtime., Disp: , Rfl:    lisinopril (ZESTRIL) 40 MG tablet, Take 1 tablet by mouth once daily, Disp: 90 tablet, Rfl: 0   magnesium oxide (  MAG-OX) 400 (241.3 Mg) MG tablet, Take 1 tablet by mouth daily., Disp: , Rfl:    Magnesium Oxide 400 (240 Mg) MG TABS, Take 1 tablet (400 mg total) by mouth daily., Disp: 30 tablet, Rfl: 1   multivitamin-iron-minerals-folic acid (CENTRUM) chewable tablet, Chew 1 tablet by mouth daily., Disp: , Rfl:    potassium chloride (KLOR-CON) 10 MEQ tablet, Take 1 tablet (10 mEq total) by mouth daily., Disp: 30 tablet, Rfl: 1   sildenafil (REVATIO) 20 MG tablet, Take 1 tablet (20 mg total) by mouth 3 (three) times daily., Disp: 90 tablet, Rfl: 11   timolol (TIMOPTIC) 0.5 % ophthalmic solution, Place 1 drop into both eyes daily., Disp: , Rfl:    torsemide (DEMADEX) 20 MG tablet, Take 40 mg by mouth daily., Disp: , Rfl:    traZODone (DESYREL) 50 MG tablet, Take 0.5-1 tablets (25-50 mg total) by mouth at bedtime as needed for sleep., Disp: 30 tablet, Rfl: 3    Physical Exam: Blood pressure (!) 146/82, pulse 72, height 5' (1.524 m), weight 53 kg, SpO2 92 %.    Affect appropriate Healthy:  appears stated age 89: normal Neck supple with no adenopathy JVP elevated  no bruits no thyromegaly Lungs clear with no wheezing and good diaphragmatic motion Heart:  S1/S2 no murmur, no rub, gallop or click PMI normal Abdomen: benighn, BS positve, no tenderness, no AAA no bruit.  No HSM or HJR Distal pulses intact with no bruits Neuro non-focal Varicosities with edema and mild erythema bilaterally chronic ichthyosis and skin changes  No muscular weakness   Labs:   Lab Results   Component Value Date   WBC 8.8 08/23/2020   HGB 12.8 08/23/2020   HCT 41.9 08/23/2020   MCV 84 08/23/2020   PLT 439 08/23/2020    No results for input(s): NA, K, CL, CO2, BUN, CREATININE, CALCIUM, PROT, BILITOT, ALKPHOS, ALT, AST, GLUCOSE in the last 168 hours.  Invalid input(s): LABALBU No results found for: CKTOTAL, CKMB, CKMBINDEX, TROPONINI  Lab Results  Component Value Date   CHOL 88 (L) 05/31/2020   Lab Results  Component Value Date   HDL 46 05/31/2020   Lab Results  Component Value Date   LDLCALC 27 05/31/2020   Lab Results  Component Value Date   TRIG 67 05/31/2020   Lab Results  Component Value Date   CHOLHDL 1.9 05/31/2020   No results found for: LDLDIRECT    Radiology: No results found.  EKG: SR rate 85 LAE diffuse ST depression LAD   ASSESSMENT AND PLAN:   1. HTN: improved with revatio stable  2. Diastolic CHF:  Related to age and hypertensive urgency as well as type 2 DM  Continue demadex  3. DM:  Discussed low carb diet.  Target hemoglobin A1c is 6.5 or less.  Continue current medications. 4. HLD:  Continue statin  5. Edema:  Venous insufficiency unable to use compression stockings elevation/diuretic 6. Pulmonary HTN:  By most recent echo CTA negative for PE x 2 07/10/20 and Now on revatio and imdur d/c will check non contrast high res chest CT to see extent of parenchymal lung disease ? WHO Class 3 disease discussed advanced Rx for pulmonary HTN and right heart cath but at her age she defers   Chest CT high res  F/U 6 months   Signed: Jenkins Rouge 11/10/2020, 8:55 AM

## 2020-10-29 ENCOUNTER — Other Ambulatory Visit: Payer: Self-pay | Admitting: Family Medicine

## 2020-11-10 ENCOUNTER — Other Ambulatory Visit: Payer: Self-pay | Admitting: Cardiovascular Disease

## 2020-11-10 ENCOUNTER — Other Ambulatory Visit (HOSPITAL_COMMUNITY): Payer: Self-pay | Admitting: Cardiovascular Disease

## 2020-11-10 ENCOUNTER — Ambulatory Visit (INDEPENDENT_AMBULATORY_CARE_PROVIDER_SITE_OTHER): Payer: Medicare Other | Admitting: Cardiovascular Disease

## 2020-11-10 ENCOUNTER — Encounter: Payer: Self-pay | Admitting: Cardiovascular Disease

## 2020-11-10 ENCOUNTER — Other Ambulatory Visit: Payer: Self-pay

## 2020-11-10 VITALS — BP 146/82 | HR 72 | Ht 60.0 in | Wt 116.8 lb

## 2020-11-10 DIAGNOSIS — I272 Pulmonary hypertension, unspecified: Secondary | ICD-10-CM

## 2020-11-10 DIAGNOSIS — I5032 Chronic diastolic (congestive) heart failure: Secondary | ICD-10-CM

## 2020-11-10 DIAGNOSIS — I10 Essential (primary) hypertension: Secondary | ICD-10-CM | POA: Diagnosis not present

## 2020-11-10 NOTE — Patient Instructions (Addendum)
Medication Instructions:  Your physician recommends that you continue on your current medications as directed. Please refer to the Current Medication list given to you today.   *If you need a refill on your cardiac medications before your next appointment, please call your pharmacy*   Lab Work: None today  If you have labs (blood work) drawn today and your tests are completely normal, you will receive your results only by: [place] (if you have MyChart) OR A paper copy in the mail If you have any lab test that is abnormal or we need to change your treatment, we will call you to review the results.   Testing/Procedures: Chest CT non-contrast     Follow-Up: At [facility], you and your health needs are our priority.  As part of our continuing mission to provide you with exceptional heart care, we have created designated Provider Care Teams.  These Care Teams include your primary Cardiologist (physician) and Advanced Practice Providers (APPs -  Physician Assistants and Nurse Practitioners) who all work together to provide you with the care you need, when you need it.  We recommend signing up for the patient portal called "MyChart".  Sign up information is provided on this After Visit Summary.  MyChart is used to connect with patients for Virtual Visits (Telemedicine).  Patients are able to view lab/test results, encounter notes, upcoming appointments, etc.  Non-urgent messages can be sent to your provider as well.   To learn more about what you can do with MyChart, go to [email or web address].    Your next appointment:   6 month(s)  The format for your next appointment:   In Person  Provider:   Jenkins Rouge, MD   Other Instructions None

## 2020-12-01 ENCOUNTER — Ambulatory Visit (HOSPITAL_COMMUNITY): Admission: RE | Admit: 2020-12-01 | Payer: Medicare Other | Source: Ambulatory Visit

## 2020-12-02 DIAGNOSIS — L820 Inflamed seborrheic keratosis: Secondary | ICD-10-CM | POA: Diagnosis not present

## 2020-12-02 DIAGNOSIS — I872 Venous insufficiency (chronic) (peripheral): Secondary | ICD-10-CM | POA: Diagnosis not present

## 2020-12-03 ENCOUNTER — Other Ambulatory Visit: Payer: Self-pay | Admitting: Family Medicine

## 2020-12-05 ENCOUNTER — Other Ambulatory Visit: Payer: Self-pay | Admitting: Family Medicine

## 2020-12-07 ENCOUNTER — Telehealth: Payer: Self-pay

## 2020-12-07 NOTE — Telephone Encounter (Signed)
PA Approval for Sildenafil 20 mg tablets approved  through 05-07-2021.

## 2021-01-12 ENCOUNTER — Telehealth: Payer: Self-pay | Admitting: Cardiovascular Disease

## 2021-01-12 ENCOUNTER — Encounter: Payer: Self-pay | Admitting: Family Medicine

## 2021-01-12 ENCOUNTER — Other Ambulatory Visit: Payer: Self-pay

## 2021-01-12 ENCOUNTER — Ambulatory Visit (INDEPENDENT_AMBULATORY_CARE_PROVIDER_SITE_OTHER): Payer: Medicare Other | Admitting: Family Medicine

## 2021-01-12 VITALS — BP 196/69 | HR 56 | Temp 98.0°F | Ht 60.0 in | Wt 112.2 lb

## 2021-01-12 DIAGNOSIS — E1159 Type 2 diabetes mellitus with other circulatory complications: Secondary | ICD-10-CM

## 2021-01-12 DIAGNOSIS — E1169 Type 2 diabetes mellitus with other specified complication: Secondary | ICD-10-CM

## 2021-01-12 DIAGNOSIS — M545 Low back pain, unspecified: Secondary | ICD-10-CM

## 2021-01-12 DIAGNOSIS — Z794 Long term (current) use of insulin: Secondary | ICD-10-CM | POA: Diagnosis not present

## 2021-01-12 DIAGNOSIS — I152 Hypertension secondary to endocrine disorders: Secondary | ICD-10-CM | POA: Diagnosis not present

## 2021-01-12 DIAGNOSIS — I5032 Chronic diastolic (congestive) heart failure: Secondary | ICD-10-CM | POA: Diagnosis not present

## 2021-01-12 DIAGNOSIS — Z7409 Other reduced mobility: Secondary | ICD-10-CM | POA: Insufficient documentation

## 2021-01-12 DIAGNOSIS — G8929 Other chronic pain: Secondary | ICD-10-CM

## 2021-01-12 DIAGNOSIS — I2721 Secondary pulmonary arterial hypertension: Secondary | ICD-10-CM | POA: Diagnosis not present

## 2021-01-12 DIAGNOSIS — I872 Venous insufficiency (chronic) (peripheral): Secondary | ICD-10-CM | POA: Diagnosis not present

## 2021-01-12 DIAGNOSIS — E785 Hyperlipidemia, unspecified: Secondary | ICD-10-CM | POA: Diagnosis not present

## 2021-01-12 LAB — BAYER DCA HB A1C WAIVED: HB A1C (BAYER DCA - WAIVED): 6.3 % — ABNORMAL HIGH (ref 4.8–5.6)

## 2021-01-12 MED ORDER — TORSEMIDE 20 MG PO TABS: 40.0000 mg | ORAL_TABLET | Freq: Every day | ORAL | 5 refills | Status: AC

## 2021-01-12 NOTE — Telephone Encounter (Signed)
Medication refill request approved for 30 day supply and sent to [facility] in [place] per pt request.

## 2021-01-12 NOTE — Telephone Encounter (Signed)
New message      *STAT* If patient is at the pharmacy, call can be transferred to refill team.   1. Which medications need to be refilled? (please list name of each medication and dose if known)  her fluid pill , they are confused on the name , they are saying furosemide on her account its torsemide (DEMADEX) 20 MG tablet  2. Which pharmacy/location (including street and city if local pharmacy) is medication to be sent to? [facility]   3. Do they need a 30 day or 90 day supply? [place]

## 2021-01-12 NOTE — Progress Notes (Signed)
Subjective: CC:DM PCP: Janora Norlander, DO JC:[id number] [name] is a 85 y.o. female presenting to clinic today for:  1. Type 2 Diabetes with hypertension, hyperlipidemia:  Reports of blood sugars were in the low 110s today.  Her blood pressure has been controlled at her cardiologist office with current medications.  She just took her medications prior to arrival today.  Compliant with Norvasc, lisinopril, Revatio, glipizide, Janumet and Lantus.  Also taking Lipitor daily.  Last eye exam: needs Last foot exam: needs Last A1c:  Lab Results  Component Value Date   HGBA1C 7.4 (H) 08/14/2020   Nephropathy screen indicated?: on ACE-I Last flu, zoster and/or pneumovax:  Immunization History  Administered Date(s) Administered   Fluad Quad(high Dose 65+) 02/12/2019   Influenza Split 02/07/2016, 02/08/2017   Influenza, High Dose Seasonal PF 01/12/2015, 02/18/2018   Influenza,inj,Quad PF,6+ Mos 02/20/2018   Influenza,trivalent, recombinat, inj, PF 02/17/2014, 02/08/2017   Influenza-Unspecified 03/11/2020   Moderna Sars-Covid-2 Vaccination 07/03/2019, 07/29/2019   Pneumococcal Conjugate-13 01/12/2015   Pneumococcal Polysaccharide-23 06/12/2016   Tdap 06/11/2003, 06/12/2003, 09/17/2017   Zoster, Live 01/12/2015    ROS: No reports of chest pain.  Shortness of breath is still present due to pulmonary artery hypertension but is chronic and stable.  She reports venous stasis changes in the lower extremities and has been utilizing the cream as prescribed by her dermatologist  2.  Impaired mobility Patient with impaired mobility secondary to CHF, pulmonary artery hypertension, chronic low back pain.  She utilizes a cane to get around.  Her partner most recently died of cancer.  She is asking that we complete personal care services forms for her as she does not have anyone long-term to help her around the home.  She resides independently but does have some family members that are able to come in  temporarily to help until she gets a more permanent solution.  No falls.  No dementia symptoms.  Needs help with chores, bathing.    ROS: Per HPI  Allergies  Allergen Reactions   Codeine Nausea Only   Past Medical History:  Diagnosis Date   Arthritis    OA   CHF (congestive heart failure) (HCC)    DDD (degenerative disc disease), lumbosacral    Diabetes mellitus without complication (HCC)    Distal radius fracture    bilateral   GERD (gastroesophageal reflux disease)    Glaucoma    HOH (hard of hearing)    left ear   Hypertension    PONV (postoperative nausea and vomiting)     Current Outpatient Medications:    albuterol (VENTOLIN HFA) 108 (90 Base) MCG/ACT inhaler, Inhale 2 puffs into the lungs every 6 (six) hours as needed for wheezing or shortness of breath., Disp: 1 each, Rfl: 2   amLODipine (NORVASC) 10 MG tablet, Take 1 tablet (10 mg total) by mouth daily., Disp: 90 tablet, Rfl: 3   aspirin 81 MG tablet, Take 81 mg by mouth daily., Disp: , Rfl:    atorvastatin (LIPITOR) 20 MG tablet, Take 1 tablet by mouth once daily, Disp: 90 tablet, Rfl: 1   calcium-vitamin D (OSCAL WITH D) 500-200 MG-UNIT tablet, Take 1 tablet by mouth., Disp: , Rfl:    DEXILANT 60 MG capsule, Take 1 capsule by mouth daily., Disp: , Rfl:    Elastic Bandages & Supports (LUMBAR BACK BRACE/SUPPORT PAD) MISC, Velcro closing back brace.  Use as needed for back pain.  Use no more than 1 time per week., Disp: 1  each, Rfl: 0   fluticasone (FLONASE) 50 MCG/ACT nasal spray, Place into both nostrils daily., Disp: , Rfl:    glipiZIDE (GLUCOTROL) 10 MG tablet, TAKE 1 TABLET BY MOUTH ONCE DAILY BEFORE BREAKFAST, Disp: 90 tablet, Rfl: 3   glucose blood (ONETOUCH ULTRA) test strip, Test BS twice daily Dx E11.9, Disp: 200 each, Rfl: 3   JANUMET 50-1000 MG tablet, TAKE 1 TABLET BY MOUTH TWICE DAILY WITH A MEAL, Disp: 180 tablet, Rfl: 0   LANTUS SOLOSTAR 100 UNIT/ML Solostar Pen, INJECT 16 UNITS SUBCUTANEOUSLY AT BEDTIME  (Patient taking differently: Inject 16 Units into the skin at bedtime.), Disp: 15 mL, Rfl: 0   latanoprost (XALATAN) 0.005 % ophthalmic solution, 1 drop at bedtime., Disp: , Rfl:    lisinopril (ZESTRIL) 40 MG tablet, Take 1 tablet by mouth once daily, Disp: 90 tablet, Rfl: 0   magnesium oxide (MAG-OX) 400 (241.3 Mg) MG tablet, Take 1 tablet by mouth daily., Disp: , Rfl:    Magnesium Oxide 400 (240 Mg) MG TABS, Take 1 tablet (400 mg total) by mouth daily., Disp: 30 tablet, Rfl: 1   multivitamin-iron-minerals-folic acid (CENTRUM) chewable tablet, Chew 1 tablet by mouth daily., Disp: , Rfl:    potassium chloride (KLOR-CON) 10 MEQ tablet, Take 1 tablet (10 mEq total) by mouth daily., Disp: 30 tablet, Rfl: 1   sildenafil (REVATIO) 20 MG tablet, Take 1 tablet (20 mg total) by mouth 3 (three) times daily., Disp: 90 tablet, Rfl: 11   timolol (TIMOPTIC) 0.5 % ophthalmic solution, Place 1 drop into both eyes daily., Disp: , Rfl:    torsemide (DEMADEX) 20 MG tablet, Take 40 mg by mouth daily., Disp: , Rfl:    traZODone (DESYREL) 50 MG tablet, Take 0.5-1 tablets (25-50 mg total) by mouth at bedtime as needed for sleep., Disp: 30 tablet, Rfl: 3 Social History   Socioeconomic History   Marital status: Widowed    Spouse name: Not on file   Number of children: 0   Years of education: 7   Highest education level: 7th grade  Occupational History   Not on file  Tobacco Use   Smoking status: Never   Smokeless tobacco: Never  Vaping Use   Vaping Use: Never used  Substance and Sexual Activity   Alcohol use: No   Drug use: No   Sexual activity: Not Currently  Other Topics Concern   Not on file  Social History Narrative   Lives alone - no children - has 3 sisters who all live nearby   Social Determinants of Health   Financial Resource Strain: Low Risk    Difficulty of Paying Living Expenses: Not hard at all  Food Insecurity: No Food Insecurity   Worried About Charity fundraiser in the Last Year:  Never true   Arboriculturist in the Last Year: Never true  Transportation Needs: No Transportation Needs   Lack of Transportation (Medical): No   Lack of Transportation (Non-Medical): No  Physical Activity: Insufficiently Active   Days of Exercise per Week: 7 days   Minutes of Exercise per Session: 20 min  Stress: No Stress Concern Present   Feeling of Stress : Not at all  Social Connections: Moderately Integrated   Frequency of Communication with Friends and Family: More than three times a week   Frequency of Social Gatherings with Friends and Family: More than three times a week   Attends Religious Services: More than 4 times per year   Active Member of  Clubs or Organizations: Yes   Attends Archivist Meetings: More than 4 times per year   Marital Status: Widowed  Human resources officer Violence: Not At Risk   Fear of Current or Ex-Partner: No   Emotionally Abused: No   Physically Abused: No   Sexually Abused: No   Family History  Problem Relation Age of Onset   Heart disease Mother    Heart disease Father     Objective: Office vital signs reviewed. BP (!) 196/69   Pulse (!) 56   Temp 98 F (36.7 Kelly)   Ht 5' (1.524 m)   Wt 112 lb 3.2 oz (50.9 kg)   SpO2 97%   BMI 21.91 kg/m   Physical Examination:  General: Awake, alert, nontoxic elderly female, No acute distress Cardio: regular rate and rhythm, S1S2 heard, no murmurs appreciated Pulm: Decreased breath sounds in the upper lung fields.  Lower lung fields with fair air movement.  She has normal work of breathing on room air Extremities: warm, well perfused, 1+ pitting edema to mid shins, no cyanosis or clubbing; +1 pulses bilaterally MSK: Antalgic, slow gait.  Hunched station.  She has marked thoracic kyphosis.  Ambulating with use of cane Skin: Venous stasis changes noted to the lower extremities bilaterally  Assessment/ Plan: 85 y.o. female   Controlled type 2 diabetes mellitus with other specified  complication, with long-term current use of insulin (HCC) - Plan: Bayer DCA Hb A1c Waived  Hyperlipidemia associated with type 2 diabetes mellitus ([place])  Hypertension associated with diabetes ([place])  Venous stasis dermatitis of both lower extremities  Chronic bilateral low back pain without sciatica  Chronic diastolic heart failure (HCC)  Pulmonary artery hypertension (HCC)  Impaired mobility and endurance  A1c ordered  Continue statin.  Not yet due for fasting lipid  Blood pressure is not controlled but I believe this may be reflective of-not taking her medicine until prior to arrival.  I reviewed her most recent cardiology visit and her blood pressure was within acceptable range.  No changes were made.  Encouraged to continue monitoring blood pressures at home with goal of less than 150/90.  She had 1+ pedal edema but apparently this is chronic and stable.  I have completed the PCS form for her.  I certainly think that she would benefit from assistance at home since she resides independently and has multiple comorbidities which impact her ability to move around and do her activities.  No orders of the defined types were placed in this encounter.  No orders of the defined types were placed in this encounter.    Janora Norlander, DO [place] [phone]

## 2021-01-19 ENCOUNTER — Telehealth: Payer: Self-pay | Admitting: Family Medicine

## 2021-01-19 NOTE — Telephone Encounter (Signed)
LMTCB

## 2021-03-08 ENCOUNTER — Other Ambulatory Visit: Payer: Self-pay | Admitting: Family Medicine

## 2021-04-13 ENCOUNTER — Ambulatory Visit (INDEPENDENT_AMBULATORY_CARE_PROVIDER_SITE_OTHER): Payer: Medicare Other | Admitting: Family Medicine

## 2021-04-13 ENCOUNTER — Encounter: Payer: Self-pay | Admitting: Family Medicine

## 2021-04-13 VITALS — BP 188/62 | HR 61 | Temp 97.4°F | Ht 60.0 in | Wt 115.6 lb

## 2021-04-13 DIAGNOSIS — E1159 Type 2 diabetes mellitus with other circulatory complications: Secondary | ICD-10-CM | POA: Diagnosis not present

## 2021-04-13 DIAGNOSIS — Z794 Long term (current) use of insulin: Secondary | ICD-10-CM | POA: Diagnosis not present

## 2021-04-13 DIAGNOSIS — I152 Hypertension secondary to endocrine disorders: Secondary | ICD-10-CM

## 2021-04-13 DIAGNOSIS — E1169 Type 2 diabetes mellitus with other specified complication: Secondary | ICD-10-CM

## 2021-04-13 DIAGNOSIS — E785 Hyperlipidemia, unspecified: Secondary | ICD-10-CM

## 2021-04-13 LAB — BAYER DCA HB A1C WAIVED: HB A1C (BAYER DCA - WAIVED): 6.1 % — ABNORMAL HIGH (ref 4.8–5.6)

## 2021-04-13 MED ORDER — JANUMET 50-1000 MG PO TABS: 0.5000 | ORAL_TABLET | Freq: Two times a day (BID) | ORAL | 1 refills | Status: DC

## 2021-04-13 NOTE — Patient Instructions (Signed)
Bring me the form ASAP and I will complete.  It should have been mailed out to you back in August.

## 2021-04-13 NOTE — Progress Notes (Signed)
Subjective: CC:DM PCP: Janora Norlander, DO TKW:IOXB C Walsworth is a 85 y.o. female presenting to clinic today for:  1. Type 2 Diabetes with hypertension, hyperlipidemia:  Patient reports compliance with glipizide, half tablet of Janumet twice daily.  She is been totally off of the Lantus due to hypoglycemia.  She has been continued on lisinopril, Norvasc, Lipitor and torsemide.  Last eye exam: UTD Last foot exam: needs Last A1c:  Lab Results  Component Value Date   HGBA1C 6.3 (H) 01/12/2021   Nephropathy screen indicated?: UTD Last flu, zoster and/or pneumovax:  Immunization History  Administered Date(s) Administered   Fluad Quad(high Dose 65+) 02/12/2019   Influenza Split 02/07/2016, 02/08/2017   Influenza, High Dose Seasonal PF 01/12/2015, 02/18/2018   Influenza,inj,Quad PF,6+ Mos 02/20/2018   Influenza,trivalent, recombinat, inj, PF 02/17/2014, 02/08/2017   Influenza-Unspecified 03/11/2020   Moderna Sars-Covid-2 Vaccination 07/03/2019, 07/29/2019   Pneumococcal Conjugate-13 01/12/2015   Pneumococcal Polysaccharide-23 06/12/2016   Tdap 06/11/2003, 06/12/2003, 09/17/2017   Zoster, Live 01/12/2015    ROS: No chest pain, shortness of breath, falls, dizziness.  She has chronic lower extremity edema affecting left greater than right but this is typically alleviated by elevation of legs  ROS: Per HPI  Allergies  Allergen Reactions   Codeine Nausea Only   Past Medical History:  Diagnosis Date   Arthritis    OA   CHF (congestive heart failure) (HCC)    DDD (degenerative disc disease), lumbosacral    Diabetes mellitus without complication (HCC)    Distal radius fracture    bilateral   GERD (gastroesophageal reflux disease)    Glaucoma    HOH (hard of hearing)    left ear   Hypertension    PONV (postoperative nausea and vomiting)     Current Outpatient Medications:    albuterol (VENTOLIN HFA) 108 (90 Base) MCG/ACT inhaler, Inhale 2 puffs into the lungs every 6  (six) hours as needed for wheezing or shortness of breath., Disp: 1 each, Rfl: 2   amLODipine (NORVASC) 10 MG tablet, Take 1 tablet (10 mg total) by mouth daily., Disp: 90 tablet, Rfl: 3   aspirin 81 MG tablet, Take 81 mg by mouth daily., Disp: , Rfl:    atorvastatin (LIPITOR) 20 MG tablet, Take 1 tablet by mouth once daily, Disp: 90 tablet, Rfl: 1   calcium-vitamin D (OSCAL WITH D) 500-200 MG-UNIT tablet, Take 1 tablet by mouth., Disp: , Rfl:    DEXILANT 60 MG capsule, Take 1 capsule by mouth daily., Disp: , Rfl:    Elastic Bandages & Supports (LUMBAR BACK BRACE/SUPPORT PAD) MISC, Velcro closing back brace.  Use as needed for back pain.  Use no more than 1 time per week., Disp: 1 each, Rfl: 0   fluticasone (FLONASE) 50 MCG/ACT nasal spray, Place into both nostrils daily., Disp: , Rfl:    glipiZIDE (GLUCOTROL) 10 MG tablet, TAKE 1 TABLET BY MOUTH ONCE DAILY BEFORE BREAKFAST, Disp: 90 tablet, Rfl: 3   glucose blood (ONETOUCH ULTRA) test strip, Test BS twice daily Dx E11.9, Disp: 200 each, Rfl: 3   JANUMET 50-1000 MG tablet, TAKE 1 TABLET BY MOUTH TWICE DAILY WITH A MEAL, Disp: 180 tablet, Rfl: 0   LANTUS SOLOSTAR 100 UNIT/ML Solostar Pen, INJECT 16 UNITS SUBCUTANEOUSLY AT BEDTIME (Patient taking differently: Inject 16 Units into the skin at bedtime.), Disp: 15 mL, Rfl: 0   latanoprost (XALATAN) 0.005 % ophthalmic solution, 1 drop at bedtime., Disp: , Rfl:    lisinopril (ZESTRIL) 40  MG tablet, Take 1 tablet by mouth once daily, Disp: 90 tablet, Rfl: 0   magnesium oxide (MAG-OX) 400 (241.3 Mg) MG tablet, Take 1 tablet by mouth daily., Disp: , Rfl:    Magnesium Oxide 400 (240 Mg) MG TABS, Take 1 tablet (400 mg total) by mouth daily., Disp: 30 tablet, Rfl: 1   multivitamin-iron-minerals-folic acid (CENTRUM) chewable tablet, Chew 1 tablet by mouth daily., Disp: , Rfl:    potassium chloride (KLOR-CON) 10 MEQ tablet, Take 1 tablet (10 mEq total) by mouth daily., Disp: 30 tablet, Rfl: 1   sildenafil  (REVATIO) 20 MG tablet, Take 1 tablet (20 mg total) by mouth 3 (three) times daily., Disp: 90 tablet, Rfl: 11   timolol (TIMOPTIC) 0.5 % ophthalmic solution, Place 1 drop into both eyes daily., Disp: , Rfl:    torsemide (DEMADEX) 20 MG tablet, Take 2 tablets (40 mg total) by mouth daily., Disp: 60 tablet, Rfl: 5   traZODone (DESYREL) 50 MG tablet, Take 0.5-1 tablets (25-50 mg total) by mouth at bedtime as needed for sleep., Disp: 30 tablet, Rfl: 3 Social History   Socioeconomic History   Marital status: Widowed    Spouse name: Not on file   Number of children: 0   Years of education: 7   Highest education level: 7th grade  Occupational History   Not on file  Tobacco Use   Smoking status: Never   Smokeless tobacco: Never  Vaping Use   Vaping Use: Never used  Substance and Sexual Activity   Alcohol use: No   Drug use: No   Sexual activity: Not Currently  Other Topics Concern   Not on file  Social History Narrative   Lives alone - no children - has 3 sisters who all live nearby   Social Determinants of Health   Financial Resource Strain: Low Risk    Difficulty of Paying Living Expenses: Not hard at all  Food Insecurity: No Food Insecurity   Worried About Charity fundraiser in the Last Year: Never true   Arboriculturist in the Last Year: Never true  Transportation Needs: No Transportation Needs   Lack of Transportation (Medical): No   Lack of Transportation (Non-Medical): No  Physical Activity: Insufficiently Active   Days of Exercise per Week: 7 days   Minutes of Exercise per Session: 20 min  Stress: No Stress Concern Present   Feeling of Stress : Not at all  Social Connections: Moderately Integrated   Frequency of Communication with Friends and Family: More than three times a week   Frequency of Social Gatherings with Friends and Family: More than three times a week   Attends Religious Services: More than 4 times per year   Active Member of [employer] or Organizations: Yes    Attends Archivist Meetings: More than 4 times per year   Marital Status: Widowed  Human resources officer Violence: Not At Risk   Fear of Current or Ex-Partner: No   Emotionally Abused: No   Physically Abused: No   Sexually Abused: No   Family History  Problem Relation Age of Onset   Heart disease Mother    Heart disease Father     Objective: Office vital signs reviewed. BP (!) 188/62   Pulse 61   Temp (!) 97.4 F (36.3 C)   Ht 5' (1.524 m)   Wt 115 lb 9.6 oz (52.4 kg)   SpO2 98%   BMI 22.58 kg/m   Physical Examination:  General: Awake,  alert, well-appearing elderly female, No acute distress HEENT: Normal; sclera white Cardio: regular rate and rhythm, J1O8 heard, systolic murmur appreciated Pulm: clear to auscultation bilaterally, no wheezes, rhonchi or rales; normal work of breathing on room air Extremities: warm, well perfused, trace ankle edema, No cyanosis or clubbing; +1 pulses bilaterally Neuro: Diabetic Foot Exam - Simple   Simple Foot Form Diabetic Foot exam was performed with the following findings: Yes 04/13/2021  8:47 AM  Visual Inspection No deformities, no ulcerations, no other skin breakdown bilaterally: Yes Sensation Testing Intact to touch and monofilament testing bilaterally: Yes Pulse Check See comments: Yes Comments +1 pedal pulses. Trace edema of L>R    Assessment/ Plan: 85 y.o. female   Hypertension associated with diabetes ([place])  Controlled type 2 diabetes mellitus with other specified complication, with long-term current use of insulin ([place]) - Plan: Bayer DCA Hb A1c Waived  Hyperlipidemia associated with type 2 diabetes mellitus ([place])  Blood pressure not at goal despite recheck.  She is taking her blood pressure really recently so I advised her to come back in the next 2 weeks to have blood pressure checked with nurse.  She is on max dose calcium channel blocker, max dose of lisinopril and currently on a diuretic.  We could consider  addition of hydralazine for blood pressure control.  For her blood sugar, A1c is too low for her advanced age at 6.1 today.  I would like her to discontinue glipizide totally.  Agree with discontinuation of Lantus.  She may continue Janumet half tablet twice daily for now and we will reassess in 3 months  Continue Lipitor.  Not yet due for fasting lipid  Orders Placed This Encounter  Procedures   Bayer DCA Hb A1c Waived   Meds ordered this encounter  Medications   sitaGLIPtin-metformin (JANUMET) 50-1000 MG tablet    Sig: Take 0.5 tablets by mouth 2 (two) times daily with a meal.    Dispense:  90 tablet    Refill:  1   Medications Discontinued During This Encounter  Medication Reason   LANTUS SOLOSTAR 100 UNIT/ML Solostar Pen    glipiZIDE (GLUCOTROL) 10 MG tablet    JANUMET 50-1000 MG tablet     Kelly Thier Windell Moulding, DO [place] [phone]

## 2021-04-18 ENCOUNTER — Other Ambulatory Visit: Payer: Self-pay | Admitting: Family Medicine

## 2021-04-18 DIAGNOSIS — E1159 Type 2 diabetes mellitus with other circulatory complications: Secondary | ICD-10-CM

## 2021-04-18 DIAGNOSIS — R809 Proteinuria, unspecified: Secondary | ICD-10-CM

## 2021-04-18 DIAGNOSIS — I152 Hypertension secondary to endocrine disorders: Secondary | ICD-10-CM

## 2021-04-18 DIAGNOSIS — E1129 Type 2 diabetes mellitus with other diabetic kidney complication: Secondary | ICD-10-CM

## 2021-04-20 ENCOUNTER — Ambulatory Visit (INDEPENDENT_AMBULATORY_CARE_PROVIDER_SITE_OTHER): Payer: Medicare Other | Admitting: Family Medicine

## 2021-04-20 VITALS — BP 200/82

## 2021-04-20 DIAGNOSIS — E1159 Type 2 diabetes mellitus with other circulatory complications: Secondary | ICD-10-CM

## 2021-04-20 DIAGNOSIS — I152 Hypertension secondary to endocrine disorders: Secondary | ICD-10-CM

## 2021-04-20 MED ORDER — AMLODIPINE BESYLATE 10 MG PO TABS: 10.0000 mg | ORAL_TABLET | Freq: Every day | ORAL | 3 refills | Status: DC

## 2021-04-20 MED ORDER — ATORVASTATIN CALCIUM 20 MG PO TABS: 20.0000 mg | ORAL_TABLET | Freq: Every day | ORAL | 3 refills | Status: AC

## 2021-04-20 NOTE — Progress Notes (Signed)
Telephone visit  Subjective: CC: Uncontrolled hypertension PCP: Janora Norlander, DO TJQ:ZESP C [name] is a 85 y.o. female calls for telephone consult today. Patient provides verbal consent for consult held via phone.  Due to COVID-19 pandemic this visit was conducted virtually. This visit type was conducted due to national recommendations for restrictions regarding the COVID-19 Pandemic (e.g. social distancing, sheltering in place) in an effort to limit this patient's exposure and mitigate transmission in our community. All issues noted in this document were discussed and addressed.  A physical exam was not performed with this format.   Location of patient: home Location of provider: [facility] Others present for call: none  1.  Uncontrolled hypertension Patient reports blood pressures have been 200s over 70s and 80s over the last 2 days.  When we saw her just a few days ago her blood pressure was also elevated.  After further discussion it appears that she has been off of her amlodipine.  She has been compliant with her diuretic and lisinopril.  Previously also treated with Imdur, beta-blocker and hydralazine which were discontinued earlier this year.  No reports of chest pain shortness of breath, unilateral weakness, sensory changes, difficulty with speech or headache.   ROS: Per HPI  Allergies  Allergen Reactions   Codeine Nausea Only   Past Medical History:  Diagnosis Date   Arthritis    OA   CHF (congestive heart failure) (HCC)    DDD (degenerative disc disease), lumbosacral    Diabetes mellitus without complication (HCC)    Distal radius fracture    bilateral   GERD (gastroesophageal reflux disease)    Glaucoma    HOH (hard of hearing)    left ear   Hypertension    PONV (postoperative nausea and vomiting)     Current Outpatient Medications:    albuterol (VENTOLIN HFA) 108 (90 Base) MCG/ACT inhaler, Inhale 2 puffs into the lungs every 6 (six) hours as needed for wheezing  or shortness of breath., Disp: 1 each, Rfl: 2   amLODipine (NORVASC) 10 MG tablet, Take 1 tablet (10 mg total) by mouth daily., Disp: 90 tablet, Rfl: 3   aspirin 81 MG tablet, Take 81 mg by mouth daily., Disp: , Rfl:    atorvastatin (LIPITOR) 20 MG tablet, Take 1 tablet by mouth once daily, Disp: 90 tablet, Rfl: 1   calcium-vitamin D (OSCAL WITH D) 500-200 MG-UNIT tablet, Take 1 tablet by mouth., Disp: , Rfl:    DEXILANT 60 MG capsule, Take 1 capsule by mouth daily., Disp: , Rfl:    Elastic Bandages & Supports (LUMBAR BACK BRACE/SUPPORT PAD) MISC, Velcro closing back brace.  Use as needed for back pain.  Use no more than 1 time per week., Disp: 1 each, Rfl: 0   fluticasone (FLONASE) 50 MCG/ACT nasal spray, Place into both nostrils daily., Disp: , Rfl:    glucose blood (ONETOUCH ULTRA) test strip, Test BS twice daily Dx E11.9, Disp: 200 each, Rfl: 3   latanoprost (XALATAN) 0.005 % ophthalmic solution, 1 drop at bedtime., Disp: , Rfl:    lisinopril (ZESTRIL) 40 MG tablet, Take 1 tablet by mouth once daily, Disp: 90 tablet, Rfl: 0   magnesium oxide (MAG-OX) 400 (241.3 Mg) MG tablet, Take 1 tablet by mouth daily., Disp: , Rfl:    Magnesium Oxide 400 (240 Mg) MG TABS, Take 1 tablet (400 mg total) by mouth daily., Disp: 30 tablet, Rfl: 1   multivitamin-iron-minerals-folic acid (CENTRUM) chewable tablet, Chew 1 tablet by mouth daily.,  Disp: , Rfl:    potassium chloride (KLOR-CON) 10 MEQ tablet, Take 1 tablet (10 mEq total) by mouth daily., Disp: 30 tablet, Rfl: 1   sildenafil (REVATIO) 20 MG tablet, Take 1 tablet (20 mg total) by mouth 3 (three) times daily., Disp: 90 tablet, Rfl: 11   sitaGLIPtin-metformin (JANUMET) 50-1000 MG tablet, Take 0.5 tablets by mouth 2 (two) times daily with a meal., Disp: 90 tablet, Rfl: 1   timolol (TIMOPTIC) 0.5 % ophthalmic solution, Place 1 drop into both eyes daily., Disp: , Rfl:    torsemide (DEMADEX) 20 MG tablet, Take 2 tablets (40 mg total) by mouth daily., Disp: 60  tablet, Rfl: 5   traZODone (DESYREL) 50 MG tablet, Take 0.5-1 tablets (25-50 mg total) by mouth at bedtime as needed for sleep., Disp: 30 tablet, Rfl: 3  Assessment/ Plan: 85 y.o. female   Hypertension associated with diabetes ([place]) - Plan: amLODipine (NORVASC) 10 MG tablet  Restart norvasc 10mg .  Has appt with Dr Johnsie Cancel in a few weeks.  I reached out to him and he is aware of BP elevation and agrees norvasc re-initiation.  Start time: 1:37pm End time: 1:45pm  Total time spent on patient care (including telephone call/ virtual visit): 8 minutes  [place], [place] [phone]

## 2021-05-02 NOTE — Progress Notes (Signed)
CARDIOLOGY CONSULT NOTE       Patient ID: [name] MRN: [id number] DOB/AGE: 09-23-1935 85 y.o.  Primary Physician: Janora Norlander, DO Primary Cardiologist: Johnsie Cancel   HPI:  85 y.o. referred by Dr Lajuana Ripple on 7/79/39 for diastolic CHF Admitted to [facility] 12/11/19 with progressive dyspnea 48 hours associated with orthopnea Associated with dry cough, headaches and fatigue No chest pain or edema BP very elevated on presentation systolic 030 mmHg she was Rx with iv nitro and lasix WBC 16.4 troponin negative x 2 BNP only 379 COVID/Influenza negative CXR showed diffuse infiltrates consistent with pneumonitis   She is diabetic No history of cardiac issues Echo done 12/11/19 reviewed EF 55-60% moderate LVH grade 2 diastolic CHF mild MR MRA with no RAS and K 3.6 Cr 0.78  She lives alone with 4 sisters locally and a boyfriend   Seen in ED 04/02/20 with right rib fracture after a fall  Seen in ED 05/05/20 for dyspnea and edema Rx lasix CXR normal  Seen in ED 1/23 with LLE cellulitis with ankle pain and BP elevated Rx with Bactrim BNP 833  Has LE venous reflux that contributes to her edema    Hospitalized with covid and dyspnea with streroids and remdesivir March 2022 Readmitted 08/13/20 more orthopnea, edema and dyspnea CTA negative for PE Lasix changed to demadex 40 mg daily   TTE 07/11/20 EF 70-75% moderate LVH estimated PA pressure elevated 86 mmHg but Normal RV size and function   She has not been taking her Demedex ? Pharmacy had trouble getting Weight at home stable 123-125 lbs  09/20/20 imdur changed to Revatio CXR 09/20/20 showed chronic lung changes with low volumes NAD  Doing well LE edema stable with no skin breakdown  Caring for a friend at her house with advanced metastatic lung CA Seems like hospice case   BP elevated with primary 04/20/21 and norvasc 10 mg restarted   She feels great Her edema is better and she is wearing her compression hose She is watching her diet and  still very spry for her age     ROS All other systems reviewed and negative except as noted above  Past Medical History:  Diagnosis Date   Arthritis    OA   CHF (congestive heart failure) (HCC)    DDD (degenerative disc disease), lumbosacral    Diabetes mellitus without complication ([place])    Distal radius fracture    bilateral   GERD (gastroesophageal reflux disease)    Glaucoma    HOH (hard of hearing)    left ear   Hypertension    PONV (postoperative nausea and vomiting)     Family History  Problem Relation Age of Onset   Heart disease Mother    Heart disease Father     Social History   Socioeconomic History   Marital status: Widowed    Spouse name: Not on file   Number of children: 0   Years of education: 7   Highest education level: 7th grade  Occupational History   Not on file  Tobacco Use   Smoking status: Never   Smokeless tobacco: Never  Vaping Use   Vaping Use: Never used  Substance and Sexual Activity   Alcohol use: No   Drug use: No   Sexual activity: Not Currently  Other Topics Concern   Not on file  Social History Narrative   Lives alone - no children - has 3 sisters who all live nearby   Social  Determinants of Health   Financial Resource Strain: Low Risk    Difficulty of Paying Living Expenses: Not hard at all  Food Insecurity: No Food Insecurity   Worried About [place] in the Last Year: Never true   Ran Out of Food in the Last Year: Never true  Transportation Needs: No Transportation Needs   Lack of Transportation (Medical): No   Lack of Transportation (Non-Medical): No  Physical Activity: Insufficiently Active   Days of Exercise per Week: 7 days   Minutes of Exercise per Session: 20 min  Stress: No Stress Concern Present   Feeling of Stress : Not at all  Social Connections: Moderately Integrated   Frequency of Communication with Friends and Family: More than three times a week   Frequency of Social Gatherings with  Friends and Family: More than three times a week   Attends Religious Services: More than 4 times per year   Active Member of [employer] or Organizations: Yes   Attends Archivist Meetings: More than 4 times per year   Marital Status: Widowed  Human resources officer Violence: Not At Risk   Fear of Current or Ex-Partner: No   Emotionally Abused: No   Physically Abused: No   Sexually Abused: No    Past Surgical History:  Procedure Laterality Date   ABDOMINAL HYSTERECTOMY     BREAST BIOPSY     BREAST EXCISIONAL BIOPSY Left    benign   BREAST SURGERY     left milk duct    EYE SURGERY     cataract   FRACTURE SURGERY Left    hip fracture   OPEN REDUCTION INTERNAL FIXATION (ORIF) DISTAL RADIAL FRACTURE Bilateral 05/24/2015   Procedure: OPEN REDUCTION INTERNAL FIXATION (ORIF) BILATERAL DISTAL RADIAL FRACTURE VOLAR PLATE;  Surgeon: Daryll Brod, MD;  Location: [facility];  Service: Orthopedics;  Laterality: Bilateral;      Current Outpatient Medications:    albuterol (VENTOLIN HFA) 108 (90 Base) MCG/ACT inhaler, Inhale 2 puffs into the lungs every 6 (six) hours as needed for wheezing or shortness of breath., Disp: 1 each, Rfl: 2   amLODipine (NORVASC) 10 MG tablet, Take 1 tablet (10 mg total) by mouth daily., Disp: 90 tablet, Rfl: 3   aspirin 81 MG tablet, Take 81 mg by mouth daily., Disp: , Rfl:    atorvastatin (LIPITOR) 20 MG tablet, Take 1 tablet (20 mg total) by mouth daily., Disp: 90 tablet, Rfl: 3   calcium-vitamin D (OSCAL WITH D) 500-200 MG-UNIT tablet, Take 1 tablet by mouth., Disp: , Rfl:    DEXILANT 60 MG capsule, Take 1 capsule by mouth daily., Disp: , Rfl:    Elastic Bandages & Supports (LUMBAR BACK BRACE/SUPPORT PAD) MISC, Velcro closing back brace.  Use as needed for back pain.  Use no more than 1 time per week., Disp: 1 each, Rfl: 0   fluticasone (FLONASE) 50 MCG/ACT nasal spray, Place into both nostrils daily., Disp: , Rfl:    glucose blood (ONETOUCH ULTRA) test  strip, Test BS twice daily Dx E11.9, Disp: 200 each, Rfl: 3   latanoprost (XALATAN) 0.005 % ophthalmic solution, 1 drop at bedtime., Disp: , Rfl:    lisinopril (ZESTRIL) 40 MG tablet, Take 1 tablet by mouth once daily, Disp: 90 tablet, Rfl: 0   magnesium oxide (MAG-OX) 400 (241.3 Mg) MG tablet, Take 1 tablet by mouth daily., Disp: , Rfl:    Magnesium Oxide 400 (240 Mg) MG TABS, Take 1 tablet (400 mg  total) by mouth daily., Disp: 30 tablet, Rfl: 1   multivitamin-iron-minerals-folic acid (CENTRUM) chewable tablet, Chew 1 tablet by mouth daily., Disp: , Rfl:    potassium chloride (KLOR-CON) 10 MEQ tablet, Take 1 tablet (10 mEq total) by mouth daily., Disp: 30 tablet, Rfl: 1   sildenafil (REVATIO) 20 MG tablet, Take 1 tablet (20 mg total) by mouth 3 (three) times daily., Disp: 90 tablet, Rfl: 11   sitaGLIPtin-metformin (JANUMET) 50-1000 MG tablet, Take 0.5 tablets by mouth 2 (two) times daily with a meal., Disp: 90 tablet, Rfl: 1   timolol (TIMOPTIC) 0.5 % ophthalmic solution, Place 1 drop into both eyes daily., Disp: , Rfl:    torsemide (DEMADEX) 20 MG tablet, Take 2 tablets (40 mg total) by mouth daily., Disp: 60 tablet, Rfl: 5   traZODone (DESYREL) 50 MG tablet, Take 0.5-1 tablets (25-50 mg total) by mouth at bedtime as needed for sleep., Disp: 30 tablet, Rfl: 3    Physical Exam: Height 5' (1.524 m), weight 117 lb (53.1 kg).    Affect appropriate Healthy:  appears stated age 59: normal Neck supple with no adenopathy JVP elevated  no bruits no thyromegaly Lungs clear with no wheezing and good diaphragmatic motion Heart:  S1/S2 no murmur, no rub, gallop or click PMI normal Abdomen: benighn, BS positve, no tenderness, no AAA no bruit.  No HSM or HJR Distal pulses intact with no bruits Neuro non-focal Varicosities with edema and mild erythema bilaterally chronic ichthyosis and skin changes  No muscular weakness   Labs:   Lab Results  Component Value Date   WBC 8.8 08/23/2020    HGB 12.8 08/23/2020   HCT 41.9 08/23/2020   MCV 84 08/23/2020   PLT 439 08/23/2020    No results for input(s): NA, K, CL, CO2, BUN, CREATININE, CALCIUM, PROT, BILITOT, ALKPHOS, ALT, AST, GLUCOSE in the last 168 hours.  Invalid input(s): LABALBU No results found for: CKTOTAL, CKMB, CKMBINDEX, TROPONINI  Lab Results  Component Value Date   CHOL 88 (L) 05/31/2020   Lab Results  Component Value Date   HDL 46 05/31/2020   Lab Results  Component Value Date   LDLCALC 27 05/31/2020   Lab Results  Component Value Date   TRIG 67 05/31/2020   Lab Results  Component Value Date   CHOLHDL 1.9 05/31/2020   No results found for: LDLDIRECT    Radiology: No results found.  EKG: SR rate 85 LAE diffuse ST depression LAD   ASSESSMENT AND PLAN:   1. HTN: norvasc added 04/28/21  Also on zetril 40 mg demadex 40 mg revatio 20 mg  Given her age and fact that she feels great would not add any further meds. In future can consider hydralazine if needed  2. Diastolic CHF:  Related to age and hypertensive urgency as well as type 2 DM  Continue demadex  3. DM:  Discussed low carb diet.  Target hemoglobin A1c is 6.5 or less.  Continue current medications. 4. HLD:  Continue statin  5. Edema:  Venous insufficiency unable to use compression stockings elevation/diuretic 6. Pulmonary HTN:  By most recent echo CTA negative for PE x 2 07/10/20 and Now on revatio WHO Class 3 disease discussed advanced Rx for pulmonary HTN and right heart cath but at her age she defers   F/U 6 months   Signed: Jenkins [place] 05/06/2021, 9:06 AM

## 2021-05-04 ENCOUNTER — Ambulatory Visit (INDEPENDENT_AMBULATORY_CARE_PROVIDER_SITE_OTHER): Payer: Medicare Other | Admitting: Family Medicine

## 2021-05-04 VITALS — BP 158/66 | HR 70 | Ht 60.0 in | Wt 118.0 lb

## 2021-05-04 DIAGNOSIS — I10 Essential (primary) hypertension: Secondary | ICD-10-CM | POA: Diagnosis not present

## 2021-05-04 NOTE — Progress Notes (Signed)
Subjective: CC:HTN PCP: Janora Norlander, DO [name] is a 85 y.o. female presenting to clinic today for:  1. Uncontrolled HTN We had a telephone visit a couple of weeks ago and patient noted that her blood pressures were in the 937D systolic.  She apparently had not been taking Norvasc 10 mg daily this was reinitiated.  Her blood pressures at home since that time have been 160/60s.  She denies any headache, dizziness, chest pain, shortness of breath, change in urine output.  She generally has a low-salt diet and only has low-sodium bacon every once in a while.  She tries to stay active.  She has chronic edema of the lower extremities but this is mild and stable.  She uses compression hose   ROS: Per HPI  Allergies  Allergen Reactions   Codeine Nausea Only   Past Medical History:  Diagnosis Date   Arthritis    OA   CHF (congestive heart failure) (HCC)    DDD (degenerative disc disease), lumbosacral    Diabetes mellitus without complication (HCC)    Distal radius fracture    bilateral   GERD (gastroesophageal reflux disease)    Glaucoma    HOH (hard of hearing)    left ear   Hypertension    PONV (postoperative nausea and vomiting)     Current Outpatient Medications:    albuterol (VENTOLIN HFA) 108 (90 Base) MCG/ACT inhaler, Inhale 2 puffs into the lungs every 6 (six) hours as needed for wheezing or shortness of breath., Disp: 1 each, Rfl: 2   amLODipine (NORVASC) 10 MG tablet, Take 1 tablet (10 mg total) by mouth daily., Disp: 90 tablet, Rfl: 3   aspirin 81 MG tablet, Take 81 mg by mouth daily., Disp: , Rfl:    atorvastatin (LIPITOR) 20 MG tablet, Take 1 tablet (20 mg total) by mouth daily., Disp: 90 tablet, Rfl: 3   calcium-vitamin D (OSCAL WITH D) 500-200 MG-UNIT tablet, Take 1 tablet by mouth., Disp: , Rfl:    DEXILANT 60 MG capsule, Take 1 capsule by mouth daily., Disp: , Rfl:    Elastic Bandages & Supports (LUMBAR BACK BRACE/SUPPORT PAD) MISC, Velcro closing  back brace.  Use as needed for back pain.  Use no more than 1 time per week., Disp: 1 each, Rfl: 0   fluticasone (FLONASE) 50 MCG/ACT nasal spray, Place into both nostrils daily., Disp: , Rfl:    glucose blood (ONETOUCH ULTRA) test strip, Test BS twice daily Dx E11.9, Disp: 200 each, Rfl: 3   latanoprost (XALATAN) 0.005 % ophthalmic solution, 1 drop at bedtime., Disp: , Rfl:    lisinopril (ZESTRIL) 40 MG tablet, Take 1 tablet by mouth once daily, Disp: 90 tablet, Rfl: 0   magnesium oxide (MAG-OX) 400 (241.3 Mg) MG tablet, Take 1 tablet by mouth daily., Disp: , Rfl:    Magnesium Oxide 400 (240 Mg) MG TABS, Take 1 tablet (400 mg total) by mouth daily., Disp: 30 tablet, Rfl: 1   multivitamin-iron-minerals-folic acid (CENTRUM) chewable tablet, Chew 1 tablet by mouth daily., Disp: , Rfl:    potassium chloride (KLOR-CON) 10 MEQ tablet, Take 1 tablet (10 mEq total) by mouth daily., Disp: 30 tablet, Rfl: 1   sildenafil (REVATIO) 20 MG tablet, Take 1 tablet (20 mg total) by mouth 3 (three) times daily., Disp: 90 tablet, Rfl: 11   sitaGLIPtin-metformin (JANUMET) 50-1000 MG tablet, Take 0.5 tablets by mouth 2 (two) times daily with a meal., Disp: 90 tablet, Rfl: 1  timolol (TIMOPTIC) 0.5 % ophthalmic solution, Place 1 drop into both eyes daily., Disp: , Rfl:    torsemide (DEMADEX) 20 MG tablet, Take 2 tablets (40 mg total) by mouth daily., Disp: 60 tablet, Rfl: 5   traZODone (DESYREL) 50 MG tablet, Take 0.5-1 tablets (25-50 mg total) by mouth at bedtime as needed for sleep., Disp: 30 tablet, Rfl: 3 Social History   Socioeconomic History   Marital status: Widowed    Spouse name: Not on file   Number of children: 0   Years of education: 7   Highest education level: 7th grade  Occupational History   Not on file  Tobacco Use   Smoking status: Never   Smokeless tobacco: Never  Vaping Use   Vaping Use: Never used  Substance and Sexual Activity   Alcohol use: No   Drug use: No   Sexual activity: Not  Currently  Other Topics Concern   Not on file  Social History Narrative   Lives alone - no children - has 3 sisters who all live nearby   Social Determinants of Health   Financial Resource Strain: Low Risk    Difficulty of Paying Living Expenses: Not hard at all  Food Insecurity: No Food Insecurity   Worried About Charity fundraiser in the Last Year: Never true   Arboriculturist in the Last Year: Never true  Transportation Needs: No Transportation Needs   Lack of Transportation (Medical): No   Lack of Transportation (Non-Medical): No  Physical Activity: Insufficiently Active   Days of Exercise per Week: 7 days   Minutes of Exercise per Session: 20 min  Stress: No Stress Concern Present   Feeling of Stress : Not at all  Social Connections: Moderately Integrated   Frequency of Communication with Friends and Family: More than three times a week   Frequency of Social Gatherings with Friends and Family: More than three times a week   Attends Religious Services: More than 4 times per year   Active Member of [employer] or Organizations: Yes   Attends Archivist Meetings: More than 4 times per year   Marital Status: Widowed  Human resources officer Violence: Not At Risk   Fear of Current or Ex-Partner: No   Emotionally Abused: No   Physically Abused: No   Sexually Abused: No   Family History  Problem Relation Age of Onset   Heart disease Mother    Heart disease Father     Objective: Office vital signs reviewed. BP (!) 158/66    Pulse 70    Ht 5' (1.524 m)    Wt 118 lb (53.5 kg)    SpO2 95%    BMI 23.05 kg/m   Physical Examination:  General: Awake, alert, well nourished, No acute distress HEENT: Sclera white Cardio: regular rate and rhythm, S1S2 heard, no murmurs appreciated Pulm: clear to auscultation bilaterally, no wheezes, rhonchi or rales; normal work of breathing on room air Extremities: warm, well perfused, trace to +1 edema to shins bilaterally, no cyanosis or clubbing   MSK: Ambulating independently  Assessment/ Plan: 85 y.o. female   Uncontrolled hypertension  Blood pressure better but still not at goal.  Manual repeat today was 158/66.  I am considering clonidine versus hydralazine as addition for this patient but I have reached out to her cardiologist for guidance.  She has an appoint with him on Friday and I have asked that she keep that appointment.  No orders of the defined types  were placed in this encounter.  No orders of the defined types were placed in this encounter.    Janora Norlander, DO [place] [phone]

## 2021-05-04 NOTE — Patient Instructions (Signed)
How to Take Your Blood Pressure ?Blood pressure measures how strongly your blood is pressing against the walls of your arteries. Arteries are blood vessels that carry blood from your heart throughout your body. You can take your blood pressure at home with a machine. ?You may need to check your blood pressure at home: ?To check if you have high blood pressure (hypertension). ?To check your blood pressure over time. ?To make sure your blood pressure medicine is working. ?Supplies needed: ?Blood pressure machine, or monitor. ?Dining room chair to sit in. ?Table or desk. ?Small notebook. ?Pencil or pen. ?How to prepare ?Avoid these things for 30 minutes before checking your blood pressure: ?Having drinks with caffeine in them, such as coffee or tea. ?Drinking alcohol. ?Eating. ?Smoking. ?Exercising. ?Do these things five minutes before checking your blood pressure: ?Go to the bathroom and pee (urinate). ?Sit in a dining chair. Do not sit on a soft couch or an armchair. ?Be quiet. Do not talk. ?How to take your blood pressure ?Follow the instructions that came with your machine. If you have a digital blood pressure monitor, these may be the instructions: ?Sit up straight. ?Place your feet on the floor. Do not cross your ankles or legs. ?Rest your left arm at the level of your heart. You may rest it on a table, desk, or chair. ?Pull up your shirt sleeve. ?Wrap the blood pressure cuff around the upper part of your left arm. The cuff should be 1 inch (2.5 cm) above your elbow. It is best to wrap the cuff around bare skin. ?Fit the cuff snugly around your arm. You should be able to place only one finger between the cuff and your arm. ?Place the cord so that it rests in the bend of your elbow. ?Press the power button. ?Sit quietly while the cuff fills with air and loses air. ?Write down the numbers on the screen. ?Wait 2-3 minutes and then repeat steps 1-10. ?What do the numbers mean? ?Two numbers make up your blood  pressure. The first number is called systolic pressure. The second is called diastolic pressure. An example of a blood pressure reading is "120 over 80" (or 120/80). ?If you are an adult and do not have a medical condition, use this guide to find out if your blood pressure is normal: ?Normal ?First number: below 120. ?Second number: below 80. ?Elevated ?First number: 120-129. ?Second number: below 80. ?Hypertension stage 1 ?First number: 130-139. ?Second number: 80-89. ?Hypertension stage 2 ?First number: 140 or above. ?Second number: 90 or above. ?Your blood pressure is above normal even if only the first or only the second number is above normal. ?Follow these instructions at home: ?Medicines ?Take over-the-counter and prescription medicines only as told by your doctor. ?Tell your doctor if your medicine is causing side effects. ?General instructions ?Check your blood pressure as often as your doctor tells you to. ?Check your blood pressure at the same time every day. ?Take your monitor to your next doctor's appointment. Your doctor will: ?Make sure you are using it correctly. ?Make sure it is working right. ?Understand what your blood pressure numbers should be. ?Keep all follow-up visits as told by your doctor. This is important. ?General tips ?You will need a blood pressure machine, or monitor. Your doctor can suggest a monitor. You can buy one at a drugstore or online. When choosing one: ?Choose one with an arm cuff. ?Choose one that wraps around your upper arm. Only one finger should fit between   your arm and the cuff. ?Do not choose one that measures your blood pressure from your wrist or finger. ?Where to find more information ?American Heart Association: www.heart.org ?Contact a doctor if: ?Your blood pressure keeps being high. ?Your blood pressure is suddenly low. ?Get help right away if: ?Your first blood pressure number is higher than 180. ?Your second blood pressure number is higher than  120. ?Summary ?Check your blood pressure at the same time every day. ?Avoid caffeine, alcohol, smoking, and exercise for 30 minutes before checking your blood pressure. ?Make sure you understand what your blood pressure numbers should be. ?This information is not intended to replace advice given to you by your health care provider. Make sure you discuss any questions you have with your health care provider. ?Document Revised: 03/03/2020 Document Reviewed: 04/18/2019 ?Elsevier Patient Education ? 2022 Elsevier Inc. ? ?

## 2021-05-06 ENCOUNTER — Encounter: Payer: Self-pay | Admitting: Cardiovascular Disease

## 2021-05-06 ENCOUNTER — Ambulatory Visit (INDEPENDENT_AMBULATORY_CARE_PROVIDER_SITE_OTHER): Payer: Medicare Other | Admitting: Cardiovascular Disease

## 2021-05-06 ENCOUNTER — Other Ambulatory Visit: Payer: Self-pay

## 2021-05-06 VITALS — BP 160/70 | HR 56 | Ht 60.0 in | Wt 117.0 lb

## 2021-05-06 DIAGNOSIS — I10 Essential (primary) hypertension: Secondary | ICD-10-CM

## 2021-05-06 DIAGNOSIS — I5032 Chronic diastolic (congestive) heart failure: Secondary | ICD-10-CM | POA: Diagnosis not present

## 2021-05-06 DIAGNOSIS — I272 Pulmonary hypertension, unspecified: Secondary | ICD-10-CM | POA: Diagnosis not present

## 2021-05-06 DIAGNOSIS — R609 Edema, unspecified: Secondary | ICD-10-CM

## 2021-05-06 NOTE — Patient Instructions (Signed)
Medication Instructions:  Your physician recommends that you continue on your current medications as directed. Please refer to the Current Medication list given to you today.  *If you need a refill on your cardiac medications before your next appointment, please call your pharmacy*   Lab Work: NONE  If you have labs (blood work) drawn today and your tests are completely normal, you will receive your results only by: MyChart Message (if you have MyChart) OR A paper copy in the mail If you have any lab test that is abnormal or we need to change your treatment, we will call you to review the results.   Testing/Procedures: NONE    Follow-Up: At [facility], you and your health needs are our priority.  As part of our continuing mission to provide you with exceptional heart care, we have created designated Provider Care Teams.  These Care Teams include your primary Cardiologist (physician) and Advanced Practice Providers (APPs -  Physician Assistants and Nurse Practitioners) who all work together to provide you with the care you need, when you need it.  We recommend signing up for the patient portal called "MyChart".  Sign up information is provided on this After Visit Summary.  MyChart is used to connect with patients for Virtual Visits (Telemedicine).  Patients are able to view lab/test results, encounter notes, upcoming appointments, etc.  Non-urgent messages can be sent to your provider as well.   To learn more about what you can do with MyChart, go to [email or web address].    Your next appointment:   6 month(s)  The format for your next appointment:   In Person  Provider:   Peter Nishan, MD   Other Instructions Thank you for choosing [facility]!    

## 2021-06-07 ENCOUNTER — Other Ambulatory Visit: Payer: Self-pay | Admitting: Family Medicine

## 2021-06-22 ENCOUNTER — Ambulatory Visit: Payer: Medicare Other | Admitting: Family Medicine

## 2021-06-22 ENCOUNTER — Other Ambulatory Visit: Payer: Self-pay | Admitting: Family Medicine

## 2021-07-12 IMAGING — CT CT ANGIO CHEST
2 of 6 series · 17 of 46 positions shown · IV contrast (Omnipaque or Isovue)
Comparison: Radiograph earlier today.

CLINICAL DATA: PE suspected, low/intermediate prob, positive
D-dimer

Shortness of breath on exertion.
EXAM:
CT ANGIOGRAPHY CHEST WITH CONTRAST
TECHNIQUE: Multidetector CT imaging of the chest was performed using the
standard protocol during bolus administration of intravenous
contrast. Multiplanar CT image reconstructions and MIPs were
obtained to evaluate the vascular anatomy.
CONTRAST:  100mL OMNIPAQUE IOHEXOL 350 MG/ML SOLN

[Series 5: pe axial thins · axial · 0.75mm/px · z∈[+1174,+1426]mm · 14 of 277 slices shown]
[im 13/277  lung]
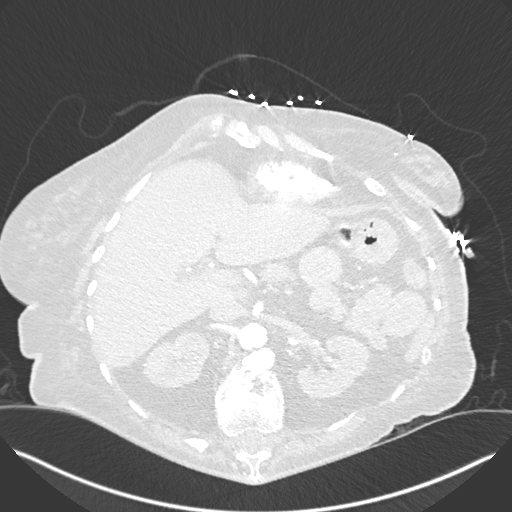
[im 37/277  soft-tissue]
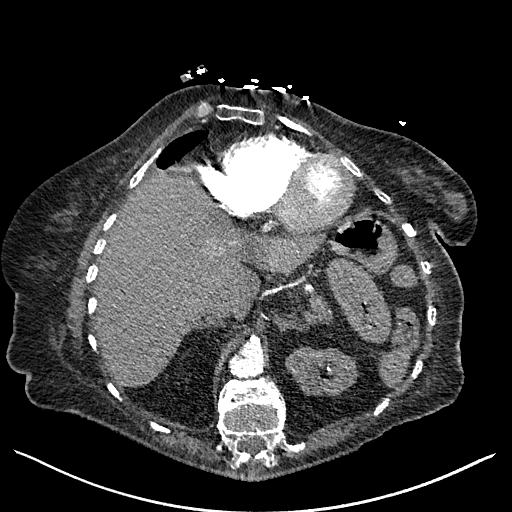
[im 49/277  lung]
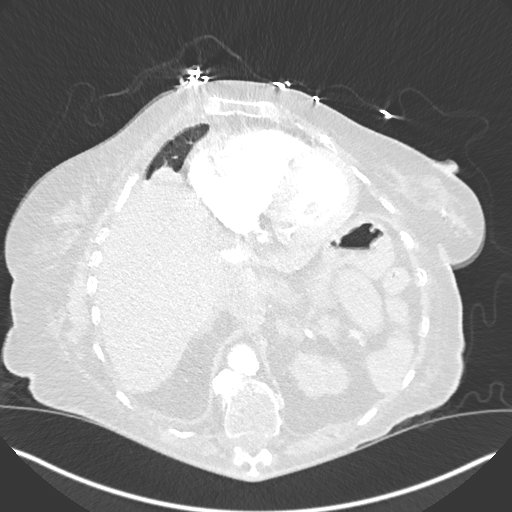
[im 73/277  soft-tissue]
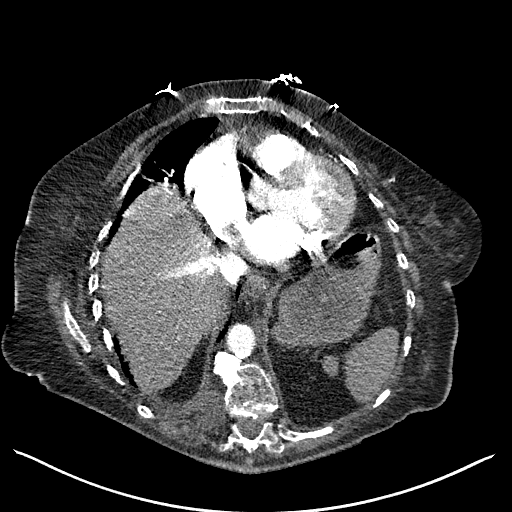
[im 97/277  lung]
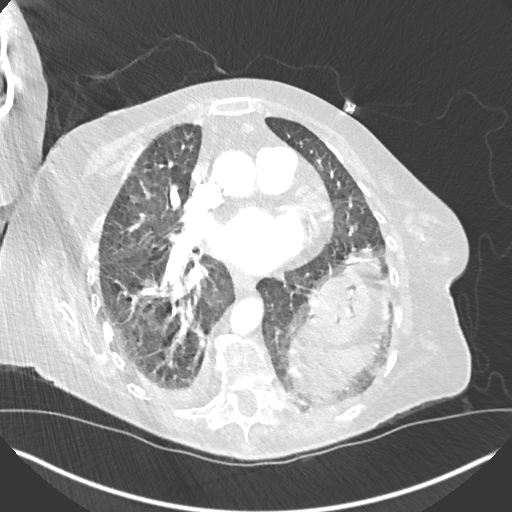
[im 109/277  soft-tissue]
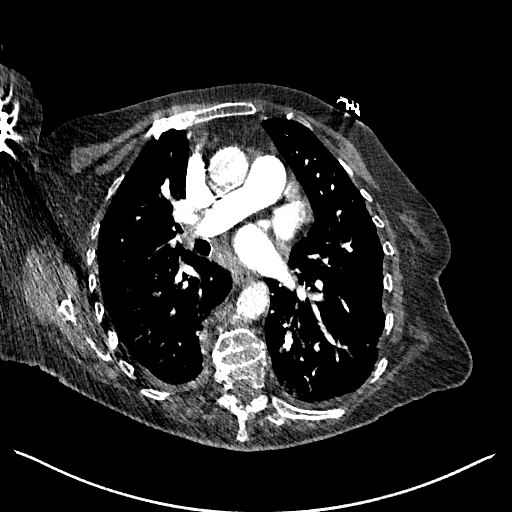
[im 133/277  lung]
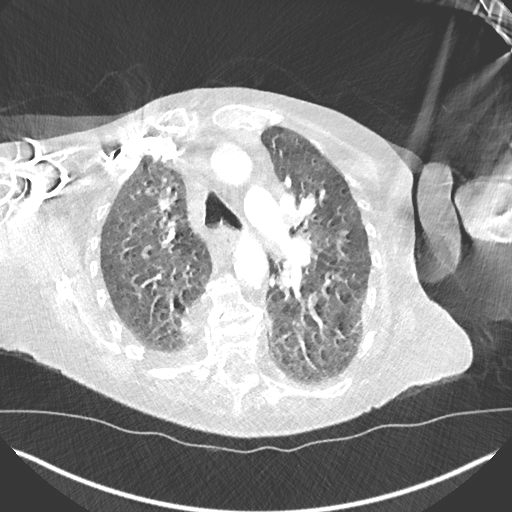
[im 145/277  soft-tissue]
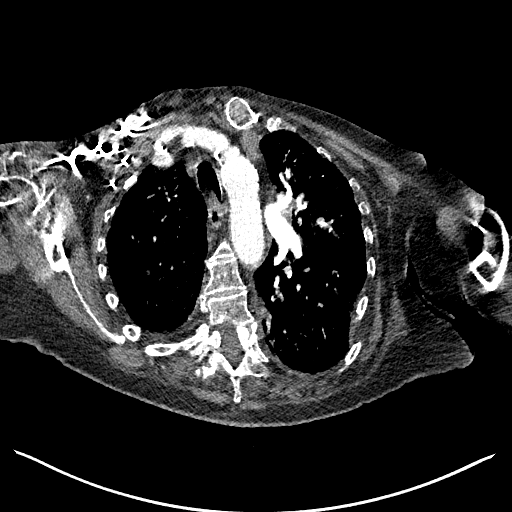
[im 169/277  lung]
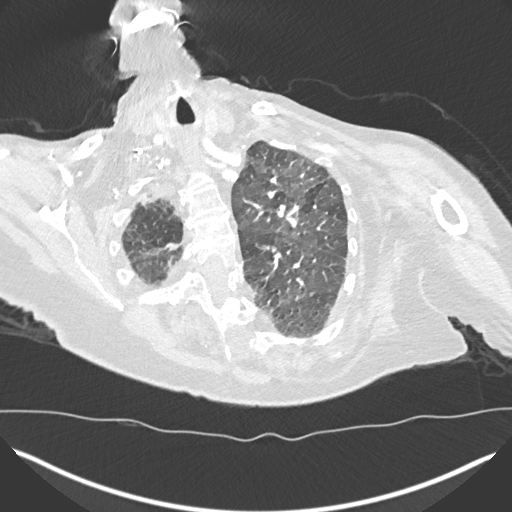
[im 181/277  soft-tissue]
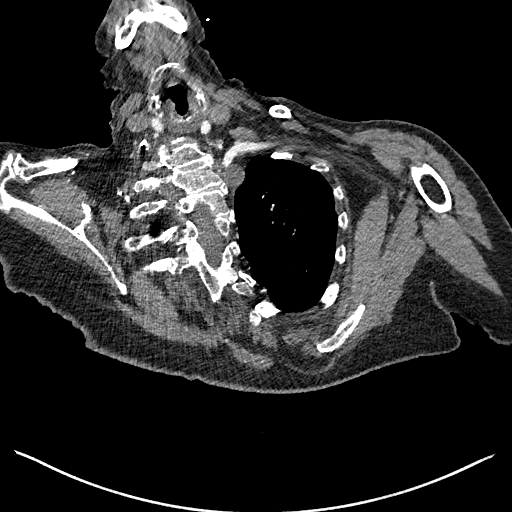
[im 205/277  lung]
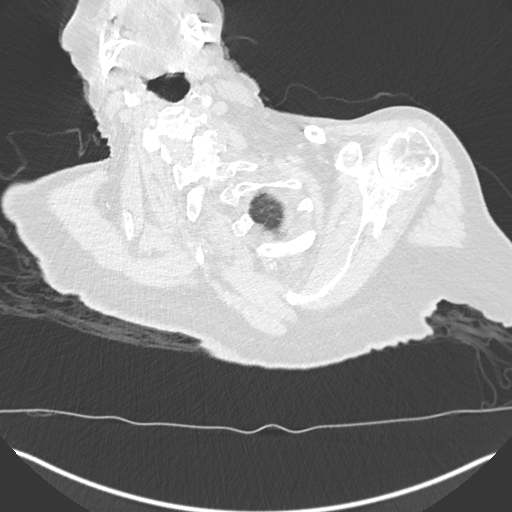
[im 229/277  soft-tissue]
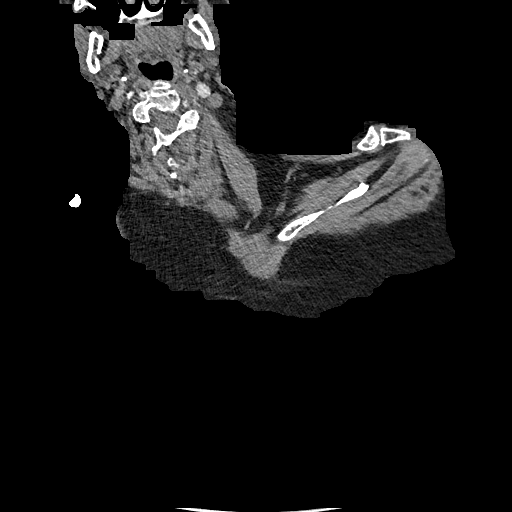
[im 241/277  lung]
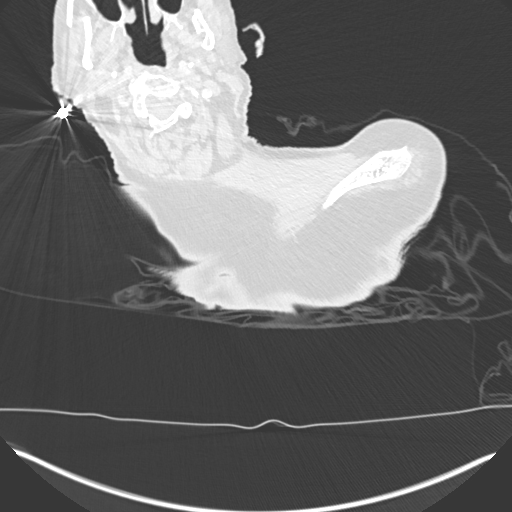
[im 265/277  soft-tissue]
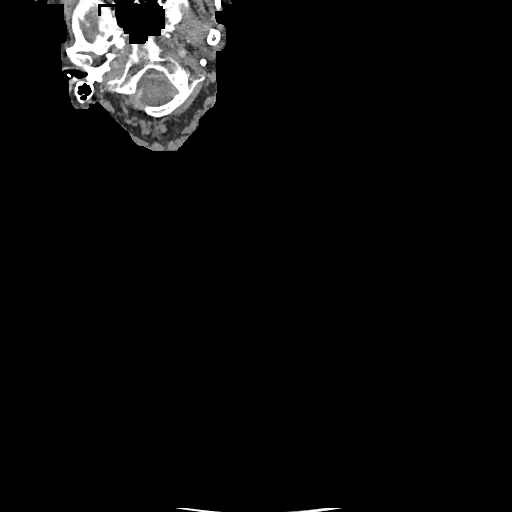

[Series 7: cor soft · coronal · 0.53mm/px · 3 of 151 slices shown]
[im 38/151  soft-tissue]
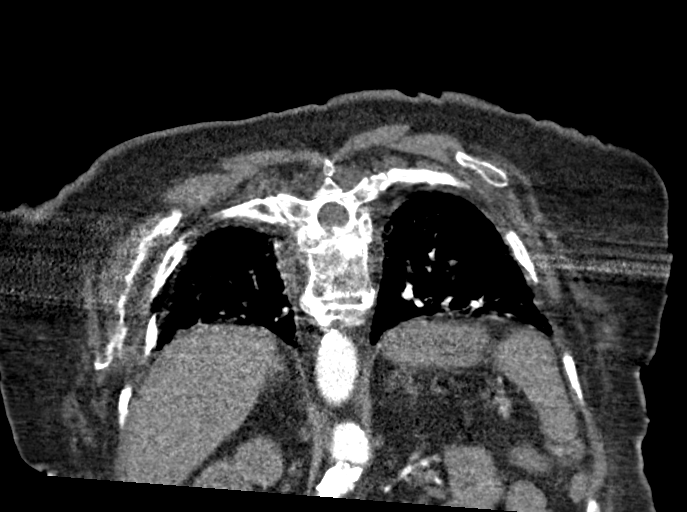
[im 76/151  soft-tissue]
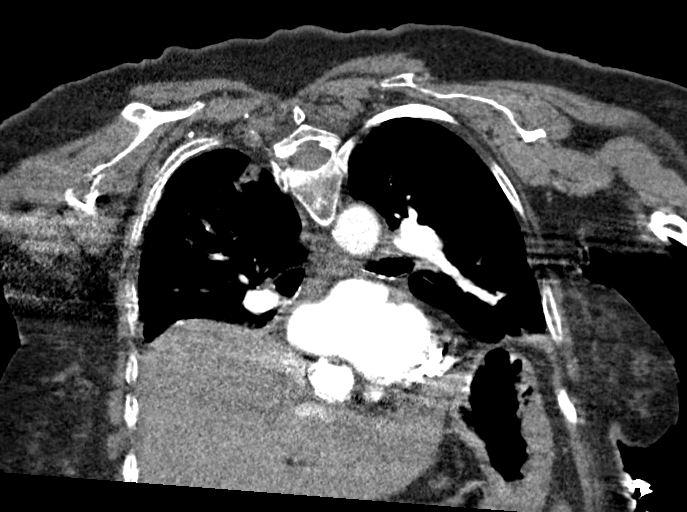
[im 113/151  soft-tissue]
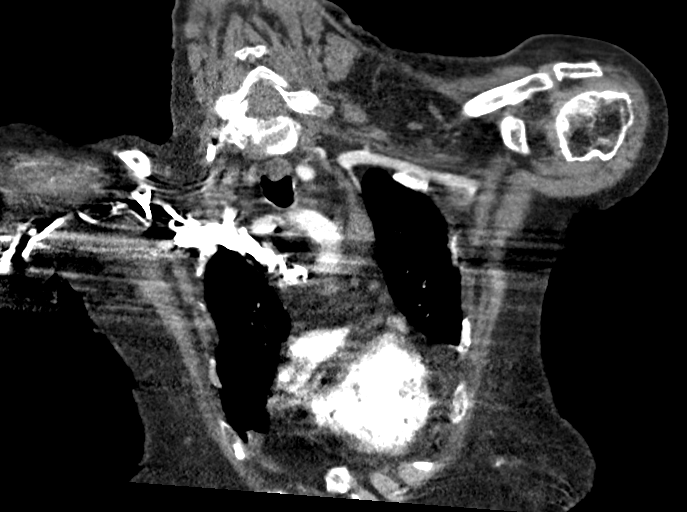

[17 of 46 positions shown; findings below may reference images not displayed]

FINDINGS: Distortion of normal anatomy due to severe kyphosis.

Cardiovascular: Motion artifact limits assessment, particularly of
the upper lobes. Allowing for this, no evidence of pulmonary
embolus. Mild cardiomegaly. There is right heart dilatation and
reflux of contrast into the hepatic veins and IVC. Dense mitral
annulus calcifications. There are coronary artery calcifications. No
pericardial effusion. Atherosclerotic tortuous thoracic aorta
without dissection or acute aortic finding.

Mediastinum/Nodes: Scattered small mediastinal lymph nodes are not
enlarged by size criteria. No hilar adenopathy. Left thyroid
calcifications without dominant nodule. No esophageal wall
thickening.

Lungs/Pleura: Breathing motion limits assessment, particularly of
the upper lungs. Bibasilar volume loss. Probable apical septal
thickening with areas of ground-glass opacity and bronchial
thickening. Favor pulmonary edema. No confluent consolidation. Trace
right pleural effusion.

Upper Abdomen: Contrast refluxes into the hepatic veins and IVC. No
other acute upper abdominal findings.

Musculoskeletal: Exaggerated thoracic kyphosis. Severe T7
compression fracture with vertebral plana appearance. Mild T3
superior endplate compression fracture. Moderate T12 compression
fracture. L1 compression fracture only partially included in the
field of view. There is a well-circumscribed 2.1 x 1.7 cm hypodense
lesion the left T1-T2 costovertebral junction, suspicious for nerve
sheath tumor. No surrounding inflammation. A smaller but similar
lesion is seen on the right T1-T2.

Review of the MIP images confirms the above findings.
IMPRESSION: 1. No pulmonary embolus allowing for motion artifact.
2. Cardiomegaly with reflux of contrast into the hepatic veins and
IVC consistent with elevated right heart pressures. Findings
suspicious for pulmonary edema, though detailed assessment is
limited by motion. Trace right pleural effusion.
3. Bibasilar volume loss.
4. Exaggerated thoracic kyphosis with multiple compression
fractures, severe at T7.
5. Well-circumscribed 2.1 x 1.7 cm hypodense lesion at the left
T1-T2 costovertebral junction, suspicious for nerve sheath tumor
such as schwannoma. Possibility of tortuous vascular structure is
also considered but felt less likely. A smaller but similar lesion
is seen on the right at same level.

Aortic Atherosclerosis (00IMS-Y3H.H).

## 2021-07-18 ENCOUNTER — Other Ambulatory Visit: Payer: Self-pay | Admitting: Family Medicine

## 2021-07-18 DIAGNOSIS — R809 Proteinuria, unspecified: Secondary | ICD-10-CM

## 2021-07-18 DIAGNOSIS — E1129 Type 2 diabetes mellitus with other diabetic kidney complication: Secondary | ICD-10-CM

## 2021-07-18 DIAGNOSIS — E1159 Type 2 diabetes mellitus with other circulatory complications: Secondary | ICD-10-CM

## 2021-07-18 DIAGNOSIS — I152 Hypertension secondary to endocrine disorders: Secondary | ICD-10-CM

## 2021-08-03 ENCOUNTER — Ambulatory Visit: Payer: Medicare Other

## 2021-08-03 ENCOUNTER — Encounter: Payer: Self-pay | Admitting: Family Medicine

## 2021-08-03 ENCOUNTER — Ambulatory Visit (INDEPENDENT_AMBULATORY_CARE_PROVIDER_SITE_OTHER): Payer: Medicare Other | Admitting: Family Medicine

## 2021-08-03 VITALS — BP 168/65 | HR 84 | Temp 98.0°F | Ht 60.0 in | Wt 117.0 lb

## 2021-08-03 DIAGNOSIS — E1159 Type 2 diabetes mellitus with other circulatory complications: Secondary | ICD-10-CM

## 2021-08-03 DIAGNOSIS — I5032 Chronic diastolic (congestive) heart failure: Secondary | ICD-10-CM

## 2021-08-03 DIAGNOSIS — I2721 Secondary pulmonary arterial hypertension: Secondary | ICD-10-CM

## 2021-08-03 DIAGNOSIS — E785 Hyperlipidemia, unspecified: Secondary | ICD-10-CM

## 2021-08-03 DIAGNOSIS — M810 Age-related osteoporosis without current pathological fracture: Secondary | ICD-10-CM | POA: Diagnosis not present

## 2021-08-03 DIAGNOSIS — I152 Hypertension secondary to endocrine disorders: Secondary | ICD-10-CM

## 2021-08-03 DIAGNOSIS — Z794 Long term (current) use of insulin: Secondary | ICD-10-CM | POA: Diagnosis not present

## 2021-08-03 DIAGNOSIS — E1169 Type 2 diabetes mellitus with other specified complication: Secondary | ICD-10-CM

## 2021-08-03 LAB — BAYER DCA HB A1C WAIVED: HB A1C (BAYER DCA - WAIVED): 7.7 % — ABNORMAL HIGH (ref 4.8–5.6)

## 2021-08-03 MED ORDER — FLUTICASONE PROPIONATE 50 MCG/ACT NA SUSP
2.0000 | Freq: Every day | NASAL | 99 refills | Status: AC
Start: 2021-08-03 — End: ?

## 2021-08-03 MED ORDER — ALBUTEROL SULFATE HFA 108 (90 BASE) MCG/ACT IN AERS: 2.0000 | INHALATION_SPRAY | Freq: Four times a day (QID) | RESPIRATORY_TRACT | 2 refills | Status: AC | PRN

## 2021-08-03 NOTE — Progress Notes (Signed)
? ?Subjective: ?CC:HTN ?PCP: Janora Norlander, DO ?DPO:EUMP C [place] is a 86 y.o. female presenting to clinic today for: ? ?1.  Hypertension associated with type 2 diabetes ?Patient is compliant with all of her medications.  She brings in her blood pressure log which shows systolics anywhere from 536R to 170s over 60s typically.  She denies any dizziness, headache, chest pain or shortness of breath outside of her baseline.  Needs her albuterol inhaler renewed.  She is compliant with half a tablet of Janumet twice daily.  No visual disturbance reported.  She saw her eye doctor and had normal retinal exam recently ? ?2.  Osteoporosis ?Patient is interested in proceeding with Prolia.  Last DEXA was in 2019 and did show osteoporosis of multiple sites. ? ? ? ?ROS: Per HPI ? ?Allergies  ?Allergen Reactions  ? Codeine Nausea Only  ? ?Past Medical History:  ?Diagnosis Date  ? Arthritis   ? OA  ? CHF (congestive heart failure) ([place])   ? DDD (degenerative disc disease), lumbosacral   ? Diabetes mellitus without complication ([place])   ? Distal radius fracture   ? bilateral  ? GERD (gastroesophageal reflux disease)   ? Glaucoma   ? HOH (hard of hearing)   ? left ear  ? Hypertension   ? PONV (postoperative nausea and vomiting)   ? ? ?Current Outpatient Medications:  ?  albuterol (VENTOLIN HFA) 108 (90 Base) MCG/ACT inhaler, Inhale 2 puffs into the lungs every 6 (six) hours as needed for wheezing or shortness of breath., Disp: 1 each, Rfl: 2 ?  amLODipine (NORVASC) 10 MG tablet, Take 1 tablet (10 mg total) by mouth daily., Disp: 90 tablet, Rfl: 3 ?  aspirin 81 MG tablet, Take 81 mg by mouth daily., Disp: , Rfl:  ?  atorvastatin (LIPITOR) 20 MG tablet, Take 1 tablet (20 mg total) by mouth daily., Disp: 90 tablet, Rfl: 3 ?  calcium-vitamin D (OSCAL WITH D) 500-200 MG-UNIT tablet, Take 1 tablet by mouth., Disp: , Rfl:  ?  DEXILANT 60 MG capsule, Take 1 capsule by mouth daily., Disp: , Rfl:  ?  Elastic Bandages & Supports (LUMBAR  BACK BRACE/SUPPORT PAD) MISC, Velcro closing back brace.  Use as needed for back pain.  Use no more than 1 time per week., Disp: 1 each, Rfl: 0 ?  fluticasone (FLONASE) 50 MCG/ACT nasal spray, Place into both nostrils daily., Disp: , Rfl:  ?  glucose blood (ONETOUCH ULTRA) test strip, Test BS twice daily Dx E11.9, Disp: 200 each, Rfl: 3 ?  latanoprost (XALATAN) 0.005 % ophthalmic solution, 1 drop at bedtime., Disp: , Rfl:  ?  lisinopril (ZESTRIL) 40 MG tablet, Take 1 tablet by mouth once daily, Disp: 90 tablet, Rfl: 0 ?  magnesium oxide (MAG-OX) 400 (241.3 Mg) MG tablet, Take 1 tablet by mouth daily., Disp: , Rfl:  ?  Magnesium Oxide 400 (240 Mg) MG TABS, Take 1 tablet (400 mg total) by mouth daily., Disp: 30 tablet, Rfl: 1 ?  multivitamin-iron-minerals-folic acid (CENTRUM) chewable tablet, Chew 1 tablet by mouth daily., Disp: , Rfl:  ?  potassium chloride (KLOR-CON) 10 MEQ tablet, Take 1 tablet (10 mEq total) by mouth daily., Disp: 30 tablet, Rfl: 1 ?  sildenafil (REVATIO) 20 MG tablet, Take 1 tablet (20 mg total) by mouth 3 (three) times daily., Disp: 90 tablet, Rfl: 11 ?  sitaGLIPtin-metformin (JANUMET) 50-1000 MG tablet, Take 0.5 tablets by mouth 2 (two) times daily with a meal., Disp: 90 tablet, Rfl:  1 ?  timolol (TIMOPTIC) 0.5 % ophthalmic solution, Place 1 drop into both eyes daily., Disp: , Rfl:  ?  torsemide (DEMADEX) 20 MG tablet, Take 2 tablets (40 mg total) by mouth daily., Disp: 60 tablet, Rfl: 5 ?  traZODone (DESYREL) 50 MG tablet, Take 0.5-1 tablets (25-50 mg total) by mouth at bedtime as needed for sleep., Disp: 30 tablet, Rfl: 3 ?Social History  ? ?Socioeconomic History  ? Marital status: Widowed  ?  Spouse name: Not on file  ? Number of children: 0  ? Years of education: 7  ? Highest education level: 7th grade  ?Occupational History  ? Not on file  ?Tobacco Use  ? Smoking status: Never  ? Smokeless tobacco: Never  ?Vaping Use  ? Vaping Use: Never used  ?Substance and Sexual Activity  ? Alcohol use:  No  ? Drug use: No  ? Sexual activity: Not Currently  ?Other Topics Concern  ? Not on file  ?Social History Narrative  ? Lives alone - no children - has 3 sisters who all live nearby  ? ?Social Determinants of Health  ? ?Financial Resource Strain: Low Risk   ? Difficulty of Paying Living Expenses: Not hard at all  ?Food Insecurity: No Food Insecurity  ? Worried About Charity fundraiser in the Last Year: Never true  ? Ran Out of Food in the Last Year: Never true  ?Transportation Needs: No Transportation Needs  ? Lack of Transportation (Medical): No  ? Lack of Transportation (Non-Medical): No  ?Physical Activity: Insufficiently Active  ? Days of Exercise per Week: 7 days  ? Minutes of Exercise per Session: 20 min  ?Stress: No Stress Concern Present  ? Feeling of Stress : Not at all  ?Social Connections: Moderately Integrated  ? Frequency of Communication with Friends and Family: More than three times a week  ? Frequency of Social Gatherings with Friends and Family: More than three times a week  ? Attends Religious Services: More than 4 times per year  ? Active Member of Clubs or Organizations: Yes  ? Attends Archivist Meetings: More than 4 times per year  ? Marital Status: Widowed  ?Intimate Partner Violence: Not At Risk  ? Fear of Current or Ex-Partner: No  ? Emotionally Abused: No  ? Physically Abused: No  ? Sexually Abused: No  ? ?Family History  ?Problem Relation Age of Onset  ? Heart disease Mother   ? Heart disease Father   ? ? ?Objective: ?Office vital signs reviewed. ?BP (!) 168/65   Pulse 84   Temp 98 ?F (36.7 ?C)   Ht 5' (1.524 m)   Wt 117 lb (53.1 kg)   SpO2 97%   BMI 22.85 kg/m?  ? ?Physical Examination:  ?General: Awake, alert, well nourished, No acute distress ?HEENT: Sclera white.  Moist mucous membranes ?Cardio: regular rate with occasional couple beats.  S1S2 heard, no murmurs appreciated ?Pulm: clear to auscultation bilaterally, no wheezes, rhonchi or rales; normal work of breathing  on room air ?MSK: Marked kyphosis of the thoracic spine noted ? ?Assessment/ Plan: ?86 y.o. female  ? ?Controlled type 2 diabetes mellitus with other specified complication, with long-term current use of insulin ([place]) - Plan: Bayer DCA Hb A1c Waived, CMP14+EGFR ? ?Hypertension associated with diabetes ([place]) - Plan: CMP14+EGFR, Magnesium, Ambulatory referral to Advanced Hypertension Clinic - [place] ? ?Hyperlipidemia associated with type 2 diabetes mellitus ([place]) - Plan: CMP14+EGFR, Lipid Panel, TSH ? ?Chronic diastolic heart  failure ([place]) - Plan: TSH, Magnesium, Ambulatory referral to Advanced Hypertension Clinic - [place] ? ?Pulmonary artery hypertension ([place]) - Plan: TSH, Magnesium, Ambulatory referral to [facility] Hypertension Clinic - [facility] [place] ? ?Age-related osteoporosis without current pathological fracture - Plan: VITAMIN D 25 Hydroxy (Vit-D Deficiency, Fractures), DG [facility] DEXA ? ?Check A1c, renal function and liver enzymes ? ?Blood pressure is not at goal despite triple therapy.  Referral to advanced hypertension clinic ? ?Check fasting lipid, CMP and TSH ? ?Check magnesium.  No evidence of fluid overload on exam ? ?Normal work of breathing on exam.  Albuterol inhaler renewed ? ?She will come back in for DEXA scan at a later time.  Wishes to start Prolia.  I will CC the Prolia team to evaluate eligibility for this.  Check vitamin D level, calcium level ? ? ?No orders of the defined types were placed in this encounter. ? ?No orders of the defined types were placed in this encounter. ? ? ? ?Janora Norlander, DO ?[place] ?[phone] ? ?

## 2021-08-03 NOTE — Patient Instructions (Signed)

## 2021-08-04 LAB — CMP14+EGFR
ALT: 13 IU/L (ref 0–32)
AST: 19 IU/L (ref 0–40)
Albumin/Globulin Ratio: 1.5 (ref 1.2–2.2)
Albumin: 4.2 g/dL (ref 3.6–4.6)
Alkaline Phosphatase: 80 IU/L (ref 44–121)
BUN/Creatinine Ratio: 20 (ref 12–28)
BUN: 20 mg/dL (ref 8–27)
Bilirubin Total: 0.4 mg/dL (ref 0.0–1.2)
CO2: 27 mmol/L (ref 20–29)
Calcium: 9.5 mg/dL (ref 8.7–10.3)
Chloride: 94 mmol/L — ABNORMAL LOW (ref 96–106)
Creatinine, Ser: 0.98 mg/dL (ref 0.57–1.00)
Globulin, Total: 2.8 g/dL (ref 1.5–4.5)
Glucose: 149 mg/dL — ABNORMAL HIGH (ref 70–99)
Potassium: 4.6 mmol/L (ref 3.5–5.2)
Sodium: 137 mmol/L (ref 134–144)
Total Protein: 7 g/dL (ref 6.0–8.5)
eGFR: 57 mL/min/{1.73_m2} — ABNORMAL LOW (ref >59)

## 2021-08-04 LAB — LIPID PANEL
Chol/HDL Ratio: 2.2 ratio (ref 0.0–4.4)
Cholesterol, Total: 128 mg/dL (ref 100–199)
HDL: 58 mg/dL (ref >39)
LDL Chol Calc (NIH): 53 mg/dL (ref 0–99)
Triglycerides: 91 mg/dL (ref 0–149)
VLDL Cholesterol Cal: 17 mg/dL (ref 5–40)

## 2021-08-04 LAB — MAGNESIUM: Magnesium: 1.1 mg/dL — ABNORMAL LOW (ref 1.6–2.3)

## 2021-08-04 LAB — VITAMIN D 25 HYDROXY (VIT D DEFICIENCY, FRACTURES): Vit D, 25-Hydroxy: 47.8 ng/mL (ref 30.0–100.0)

## 2021-08-04 LAB — TSH: TSH: 2.02 u[IU]/mL (ref 0.450–4.500)

## 2021-08-04 NOTE — Progress Notes (Signed)
Call into pharmacy ?

## 2021-08-05 ENCOUNTER — Emergency Department (HOSPITAL_COMMUNITY): Payer: Medicare Other

## 2021-08-05 ENCOUNTER — Other Ambulatory Visit: Payer: Self-pay

## 2021-08-05 ENCOUNTER — Encounter (HOSPITAL_COMMUNITY): Payer: Self-pay

## 2021-08-05 ENCOUNTER — Inpatient Hospital Stay (HOSPITAL_COMMUNITY)
Admission: EM | Admit: 2021-08-05 | Discharge: 2021-08-10 | DRG: 956 | Disposition: A | Discharge status: Skilled Nursing Facility | Payer: Medicare Other | Attending: Internal Medicine | Admitting: Internal Medicine

## 2021-08-05 DIAGNOSIS — I110 Hypertensive heart disease with heart failure: Secondary | ICD-10-CM | POA: Diagnosis not present

## 2021-08-05 DIAGNOSIS — E1159 Type 2 diabetes mellitus with other circulatory complications: Secondary | ICD-10-CM | POA: Diagnosis not present

## 2021-08-05 DIAGNOSIS — Z79899 Other long term (current) drug therapy: Secondary | ICD-10-CM | POA: Diagnosis not present

## 2021-08-05 DIAGNOSIS — Y929 Unspecified place or not applicable: Secondary | ICD-10-CM

## 2021-08-05 DIAGNOSIS — R52 Pain, unspecified: Secondary | ICD-10-CM | POA: Diagnosis not present

## 2021-08-05 DIAGNOSIS — S72142G Displaced intertrochanteric fracture of left femur, subsequent encounter for closed fracture with delayed healing: Secondary | ICD-10-CM | POA: Diagnosis present

## 2021-08-05 DIAGNOSIS — G8911 Acute pain due to trauma: Secondary | ICD-10-CM | POA: Diagnosis not present

## 2021-08-05 DIAGNOSIS — Z4789 Encounter for other orthopedic aftercare: Secondary | ICD-10-CM | POA: Diagnosis not present

## 2021-08-05 DIAGNOSIS — M6259 Muscle wasting and atrophy, not elsewhere classified, multiple sites: Secondary | ICD-10-CM | POA: Diagnosis not present

## 2021-08-05 DIAGNOSIS — R000 Tachycardia, unspecified: Secondary | ICD-10-CM | POA: Diagnosis present

## 2021-08-05 DIAGNOSIS — Z8249 Family history of ischemic heart disease and other diseases of the circulatory system: Secondary | ICD-10-CM

## 2021-08-05 DIAGNOSIS — Z7401 Bed confinement status: Secondary | ICD-10-CM | POA: Diagnosis not present

## 2021-08-05 DIAGNOSIS — Z885 Allergy status to narcotic agent status: Secondary | ICD-10-CM | POA: Diagnosis not present

## 2021-08-05 DIAGNOSIS — S72142S Displaced intertrochanteric fracture of left femur, sequela: Secondary | ICD-10-CM | POA: Diagnosis not present

## 2021-08-05 DIAGNOSIS — S32512A Fracture of superior rim of left pubis, initial encounter for closed fracture: Secondary | ICD-10-CM | POA: Diagnosis not present

## 2021-08-05 DIAGNOSIS — I5032 Chronic diastolic (congestive) heart failure: Secondary | ICD-10-CM | POA: Diagnosis present

## 2021-08-05 DIAGNOSIS — M25712 Osteophyte, left shoulder: Secondary | ICD-10-CM | POA: Diagnosis not present

## 2021-08-05 DIAGNOSIS — E119 Type 2 diabetes mellitus without complications: Secondary | ICD-10-CM

## 2021-08-05 DIAGNOSIS — D500 Iron deficiency anemia secondary to blood loss (chronic): Secondary | ICD-10-CM | POA: Diagnosis present

## 2021-08-05 DIAGNOSIS — Z7982 Long term (current) use of aspirin: Secondary | ICD-10-CM | POA: Diagnosis not present

## 2021-08-05 DIAGNOSIS — R2681 Unsteadiness on feet: Secondary | ICD-10-CM | POA: Diagnosis not present

## 2021-08-05 DIAGNOSIS — S32502A Unspecified fracture of left pubis, initial encounter for closed fracture: Secondary | ICD-10-CM | POA: Diagnosis not present

## 2021-08-05 DIAGNOSIS — E1165 Type 2 diabetes mellitus with hyperglycemia: Secondary | ICD-10-CM | POA: Diagnosis not present

## 2021-08-05 DIAGNOSIS — E785 Hyperlipidemia, unspecified: Secondary | ICD-10-CM | POA: Diagnosis not present

## 2021-08-05 DIAGNOSIS — M25552 Pain in left hip: Secondary | ICD-10-CM | POA: Diagnosis not present

## 2021-08-05 DIAGNOSIS — S72142A Displaced intertrochanteric fracture of left femur, initial encounter for closed fracture: Principal | ICD-10-CM

## 2021-08-05 DIAGNOSIS — R110 Nausea: Secondary | ICD-10-CM | POA: Diagnosis not present

## 2021-08-05 DIAGNOSIS — W010XXA Fall on same level from slipping, tripping and stumbling without subsequent striking against object, initial encounter: Secondary | ICD-10-CM | POA: Diagnosis present

## 2021-08-05 DIAGNOSIS — E860 Dehydration: Secondary | ICD-10-CM | POA: Diagnosis not present

## 2021-08-05 DIAGNOSIS — M810 Age-related osteoporosis without current pathological fracture: Secondary | ICD-10-CM | POA: Diagnosis present

## 2021-08-05 DIAGNOSIS — K219 Gastro-esophageal reflux disease without esophagitis: Secondary | ICD-10-CM | POA: Diagnosis not present

## 2021-08-05 DIAGNOSIS — R1111 Vomiting without nausea: Secondary | ICD-10-CM | POA: Diagnosis not present

## 2021-08-05 DIAGNOSIS — Z743 Need for continuous supervision: Secondary | ICD-10-CM | POA: Diagnosis not present

## 2021-08-05 DIAGNOSIS — D72829 Elevated white blood cell count, unspecified: Secondary | ICD-10-CM | POA: Diagnosis not present

## 2021-08-05 DIAGNOSIS — Z66 Do not resuscitate: Secondary | ICD-10-CM | POA: Diagnosis present

## 2021-08-05 DIAGNOSIS — S32592S Other specified fracture of left pubis, sequela: Secondary | ICD-10-CM | POA: Diagnosis not present

## 2021-08-05 DIAGNOSIS — I152 Hypertension secondary to endocrine disorders: Secondary | ICD-10-CM

## 2021-08-05 DIAGNOSIS — R531 Weakness: Secondary | ICD-10-CM | POA: Diagnosis not present

## 2021-08-05 DIAGNOSIS — S72145A Nondisplaced intertrochanteric fracture of left femur, initial encounter for closed fracture: Secondary | ICD-10-CM | POA: Diagnosis not present

## 2021-08-05 DIAGNOSIS — Z741 Need for assistance with personal care: Secondary | ICD-10-CM | POA: Diagnosis not present

## 2021-08-05 DIAGNOSIS — I509 Heart failure, unspecified: Secondary | ICD-10-CM | POA: Diagnosis not present

## 2021-08-05 DIAGNOSIS — S299XXA Unspecified injury of thorax, initial encounter: Secondary | ICD-10-CM | POA: Diagnosis not present

## 2021-08-05 DIAGNOSIS — E1169 Type 2 diabetes mellitus with other specified complication: Secondary | ICD-10-CM | POA: Diagnosis present

## 2021-08-05 DIAGNOSIS — M79622 Pain in left upper arm: Secondary | ICD-10-CM | POA: Diagnosis not present

## 2021-08-05 DIAGNOSIS — E46 Unspecified protein-calorie malnutrition: Secondary | ICD-10-CM | POA: Diagnosis not present

## 2021-08-05 DIAGNOSIS — R262 Difficulty in walking, not elsewhere classified: Secondary | ICD-10-CM | POA: Diagnosis not present

## 2021-08-05 DIAGNOSIS — S32592A Other specified fracture of left pubis, initial encounter for closed fracture: Secondary | ICD-10-CM

## 2021-08-05 DIAGNOSIS — I2510 Atherosclerotic heart disease of native coronary artery without angina pectoris: Secondary | ICD-10-CM | POA: Diagnosis not present

## 2021-08-05 DIAGNOSIS — S72142D Displaced intertrochanteric fracture of left femur, subsequent encounter for closed fracture with routine healing: Secondary | ICD-10-CM | POA: Diagnosis not present

## 2021-08-05 DIAGNOSIS — S72122A Displaced fracture of lesser trochanter of left femur, initial encounter for closed fracture: Secondary | ICD-10-CM | POA: Diagnosis not present

## 2021-08-05 DIAGNOSIS — M6281 Muscle weakness (generalized): Secondary | ICD-10-CM | POA: Diagnosis not present

## 2021-08-05 DIAGNOSIS — R739 Hyperglycemia, unspecified: Secondary | ICD-10-CM | POA: Diagnosis not present

## 2021-08-05 LAB — CBC WITH DIFFERENTIAL/PLATELET
Abs Immature Granulocytes: 0.18 10*3/uL — ABNORMAL HIGH (ref 0.00–0.07)
Basophils Absolute: 0.1 10*3/uL (ref 0.0–0.1)
Basophils Relative: 1 %
Eosinophils Absolute: 0.1 10*3/uL (ref 0.0–0.5)
Eosinophils Relative: 1 %
HCT: 34.6 % — ABNORMAL LOW (ref 36.0–46.0)
Hemoglobin: 10.4 g/dL — ABNORMAL LOW (ref 12.0–15.0)
Immature Granulocytes: 1 %
Lymphocytes Relative: 7 %
Lymphs Abs: 1.1 10*3/uL (ref 0.7–4.0)
MCH: 25.3 pg — ABNORMAL LOW (ref 26.0–34.0)
MCHC: 30.1 g/dL (ref 30.0–36.0)
MCV: 84.2 fL (ref 80.0–100.0)
Monocytes Absolute: 1 10*3/uL (ref 0.1–1.0)
Monocytes Relative: 7 %
Neutro Abs: 12.1 10*3/uL — ABNORMAL HIGH (ref 1.7–7.7)
Neutrophils Relative %: 83 %
Platelets: 331 10*3/uL (ref 150–400)
RBC: 4.11 MIL/uL (ref 3.87–5.11)
RDW: 14.9 % (ref 11.5–15.5)
WBC: 14.5 10*3/uL — ABNORMAL HIGH (ref 4.0–10.5)
nRBC: 0 % (ref 0.0–0.2)

## 2021-08-05 LAB — BASIC METABOLIC PANEL
Anion gap: 11 (ref 5–15)
BUN: 22 mg/dL (ref 8–23)
CO2: 29 mmol/L (ref 22–32)
Calcium: 8.4 mg/dL — ABNORMAL LOW (ref 8.9–10.3)
Chloride: 98 mmol/L (ref 98–111)
Creatinine, Ser: 0.9 mg/dL (ref 0.44–1.00)
GFR, Estimated: >60 mL/min (ref >60)
Glucose, Bld: 239 mg/dL — ABNORMAL HIGH (ref 70–99)
Potassium: 3.5 mmol/L (ref 3.5–5.1)
Sodium: 138 mmol/L (ref 135–145)

## 2021-08-05 LAB — TYPE AND SCREEN
ABO/RH(D): A POS
Antibody Screen: NEGATIVE

## 2021-08-05 LAB — PROTIME-INR
INR: 1 (ref 0.8–1.2)
Prothrombin Time: 13 seconds (ref 11.4–15.2)

## 2021-08-05 MED ORDER — FENTANYL CITRATE PF 50 MCG/ML IJ SOSY
50.0000 ug | PREFILLED_SYRINGE | Freq: Once | INTRAMUSCULAR | Status: AC | PRN
  Administered 2021-08-05: 50 ug via INTRAVENOUS
  Filled 2021-08-05: qty 1

## 2021-08-05 MED ORDER — CYCLOBENZAPRINE HCL 5 MG PO TABS
5.0000 mg | ORAL_TABLET | Freq: Three times a day (TID) | ORAL | Status: DC | PRN
  Administered 2021-08-05 – 2021-08-08 (×5): 5 mg via ORAL
  Filled 2021-08-05 (×5): qty 1

## 2021-08-05 MED ORDER — MORPHINE SULFATE (PF) 4 MG/ML IV SOLN
4.0000 mg | INTRAVENOUS | Status: DC | PRN
  Administered 2021-08-05 – 2021-08-06 (×4): 4 mg via INTRAVENOUS
  Filled 2021-08-05 (×5): qty 1

## 2021-08-05 NOTE — Assessment & Plan Note (Signed)
Continue PPI ?

## 2021-08-05 NOTE — ED Provider Notes (Signed)
?[facility] ?Provider Note ? ? ?CSN: [id number] ?Arrival date & time: 08/05/21  1647 ? ?  ? ?History ? ?Chief Complaint  ?Patient presents with  ? Fall  ? Hip Injury  ? ? ?[name] is a 86 y.o. female who presents today for evaluation after mechanical nonsyncopal fall injuring her left hip and left arm.  She states that she was moving her trash can when she slipped and fell.  She denies striking her head or passing out.  No pain in her head or in her neck.  She reports primary area of pain is her left hip, she has not been ambulatory since.  She also reports pain in her left upper arm and shoulder and the left side of her chest.  She denies any at this pains before her fall.  She does not take any blood thinning medications per her report. ? ?She denies any pain in her abdomen. ? ? ? ?HPI ? ?  ? ?Home Medications ?Prior to Admission medications   ?Medication Sig Start Date End Date Taking? Authorizing Provider  ?albuterol (VENTOLIN HFA) 108 (90 Base) MCG/ACT inhaler Inhale 2 puffs into the lungs every 6 (six) hours as needed for wheezing or shortness of breath. 08/03/21  Yes [name] M, DO  ?amLODipine (NORVASC) 10 MG tablet Take 1 tablet (10 mg total) by mouth daily. 04/20/21  Yes [name] M, DO  ?aspirin 81 MG tablet Take 81 mg by mouth daily.   Yes [provider]  ?atorvastatin (LIPITOR) 20 MG tablet Take 1 tablet (20 mg total) by mouth daily. 04/20/21  Yes [name] M, DO  ?calcium-vitamin D (OSCAL WITH D) 500-200 MG-UNIT tablet Take 1 tablet by mouth.   Yes [provider]  ?Cetirizine HCl (ZYRTEC ALLERGY) 10 MG CAPS Take 1 capsule by mouth daily.   Yes [provider]  ?DEXILANT 60 MG capsule Take 1 capsule by mouth daily. 02/13/20  Yes [provider]  ?fluticasone (FLONASE) 50 MCG/ACT nasal spray Place 2 sprays into both nostrils daily. 08/03/21  Yes [name] M, DO  ?latanoprost (XALATAN) 0.005 % ophthalmic solution  1 drop at bedtime.   Yes [provider]  ?lisinopril (ZESTRIL) 40 MG tablet Take 1 tablet by mouth once daily 07/18/21  Yes [name] M, DO  ?Magnesium Oxide 400 (240 Mg) MG TABS Take 1 tablet (400 mg total) by mouth daily. 08/16/20  Yes Tat, Shanon Brow, MD  ?multivitamin-iron-minerals-folic acid (CENTRUM) chewable tablet Chew 1 tablet by mouth daily.   Yes [provider]  ?sitaGLIPtin-metformin (JANUMET) 50-1000 MG tablet Take 0.5 tablets by mouth 2 (two) times daily with a meal. ?Patient taking differently: Take 1 tablet by mouth 2 (two) times daily with a meal. 04/13/21  Yes Gottschalk, Ashly M, DO  ?timolol (TIMOPTIC) 0.5 % ophthalmic solution Place 1 drop into both eyes daily. 01/27/19  Yes [provider]  ?torsemide (DEMADEX) 20 MG tablet Take 2 tablets (40 mg total) by mouth daily. 01/12/21  Yes Josue Hector, MD  ?Elastic Bandages & Supports (LUMBAR BACK BRACE/SUPPORT PAD) MISC Velcro closing back brace.  Use as needed for back pain.  Use no more than 1 time per week. 07/19/20   Janora Norlander, DO  ?glucose blood (ONETOUCH ULTRA) test strip Test BS twice daily Dx E11.9 04/06/20   [name] M, DO  ?potassium chloride (KLOR-CON) 10 MEQ tablet Take 1 tablet (10 mEq total) by mouth daily. ?Patient not taking: Reported on  08/05/2021 12/13/19   Murlean Iba, MD  ?sildenafil (REVATIO) 20 MG tablet Take 1 tablet (20 mg total) by mouth 3 (three) times daily. ?Patient not taking: Reported on 08/05/2021 09/20/20   Josue Hector, MD  ?traZODone (DESYREL) 50 MG tablet Take 0.5-1 tablets (25-50 mg total) by mouth at bedtime as needed for sleep. ?Patient not taking: Reported on 08/05/2021 06/28/20   Janora Norlander, DO  ?   ? ?Allergies    ?Codeine   ? ?Review of Systems   ?Review of Systems ? ?Physical Exam ?Updated Vital Signs ?BP (!) 158/58   Pulse (!) 105   Temp 98.2 ?F (36.8 ?C) (Oral)   Resp (!) 28   Ht 5' (1.524 m)   Wt 53.1 kg   SpO2 100%   BMI 22.85 kg/m?   ?Physical Exam ?Vitals and nursing note reviewed.  ?Constitutional:   ?   General: She is not in acute distress. ?   Appearance: She is not diaphoretic.  ?HENT:  ?   Head: Normocephalic and atraumatic.  ?Eyes:  ?   General: No scleral icterus.    ?   Right eye: No discharge.     ?   Left eye: No discharge.  ?   Conjunctiva/sclera: Conjunctivae normal.  ?Cardiovascular:  ?   Rate and Rhythm: Tachycardia present. Rhythm irregular.  ?   Heart sounds: Normal heart sounds.  ?Pulmonary:  ?   Effort: Pulmonary effort is normal. No respiratory distress.  ?   Breath sounds: Normal breath sounds. No stridor.  ?Chest:  ?   Chest wall: No deformity, tenderness or crepitus.  ?Abdominal:  ?   General: There is no distension.  ?Musculoskeletal:     ?   General: Signs of injury present.  ?   Cervical back: Normal range of motion and neck supple.  ?   Comments: Left leg is shortened and externally rotated when compared to right.  Compartments in left leg are soft and easily compressible. ?Left arm with diffuse tenderness to palpation over the upper arm and shoulder, compartments are soft and easily compressible.  No pain or tenderness in the left forearm, wrist/hand.  She spontaneously lifts her arm up off the bed without difficulty and no deformity is noted.  No Midline C-spine tenderness to palpation, step-offs, or deformities.  ?Skin: ?   General: Skin is warm and dry.  ?Neurological:  ?   Mental Status: She is alert.  ?   Motor: No abnormal muscle tone.  ?Psychiatric:     ?   Behavior: Behavior normal.  ? ? ?ED Results / Procedures / Treatments   ?Labs ?(all labs ordered are listed, but only abnormal results are displayed) ?Labs Reviewed  ?BASIC METABOLIC PANEL - Abnormal; Notable for the following components:  ?    Result Value  ? Glucose, Bld 239 (*)   ? Calcium 8.4 (*)   ? All other components within normal limits  ?CBC WITH DIFFERENTIAL/PLATELET - Abnormal; Notable for the following components:  ? WBC 14.5 (*)   ?  Hemoglobin 10.4 (*)   ? HCT 34.6 (*)   ? MCH 25.3 (*)   ? Neutro Abs 12.1 (*)   ? Abs Immature Granulocytes 0.18 (*)   ? All other components within normal limits  ?PROTIME-INR  ?TYPE AND SCREEN  ? ? ?EKG ?None ? ?Radiology ?DG Chest 2 View ? ?Result Date: 08/05/2021 ?CLINICAL DATA:  Mechanical fall.  Left hip acute fracture. EXAM: CHEST - 2  VIEW COMPARISON:  Chest two views 09/20/2020; CT chest 08/13/2020 FINDINGS: Moderately decreased lung volumes. Mild-to-moderate dextrocurvature of the mid to lower thoracic spine. Mild elevation of the left hemidiaphragm. The cardiac silhouette is not well evaluated due to the overlying hemidiaphragm. Mild calcification within aortic arch. Moderate diffuse chronic interstitial thickening, similar to prior. Old healed posterior left approximate sixth and seventh rib fractures. There are multiple thoracic and upper lumbar vertebral body compression fractures appearing unchanged from prior. This includes moderate anterior T11 and severe anterior T12 and L1 compression fractures. Markedly severe T7 compression is unchanged from 08/13/2020 CT chest. IMPRESSION: Limited study by low lung volumes and patient positioning. No definite acute lung process. Electronically Signed   By: Yvonne Kendall M.D.   On: 08/05/2021 18:25  ? ?DG Shoulder Left ? ?Result Date: 08/05/2021 ?CLINICAL DATA:  Fall.  Pain. EXAM: LEFT SHOULDER - 2+ VIEW; LEFT HUMERUS - 2+ VIEW COMPARISON:  Chest two views 09/20/2020, 08/13/2020 FINDINGS: Left shoulder: There is mild acromioclavicular joint space narrowing with moderate peripheral degenerative osteophytosis. The glenohumeral joint is appropriately aligned. There is prominence of the lateral greater tuberosity with convexity of the bone margins which appears unchanged from prior shoulder radiographs and chronic. Within the limitations of diffuse decreased bone mineralization, no definite proximal humeral fracture is seen. Left humerus: Within limitations of diffuse  decreased bone mineralization, no acute fracture is seen. There is mild-to-moderate spurring at the medial aspect of the trochlea. Partial visualization of distal radial plate and screw hardware. Old healed posterior lef

## 2021-08-05 NOTE — H&P (Signed)
?History and Physical  ? ? ?Patient: ARCENIA SCARBRO ZCH:[id number] DOB: [birth year] ?DOA: 08/05/2021 ?DOS: the patient was seen and examined on 08/05/2021 ?PCP: Janora Norlander, DO  ?Patient coming from: Home ? ?Chief Complaint:  ?Chief Complaint  ?Patient presents with  ? Fall  ? Hip Injury  ? ?HPI: AVEEN STANSEL is a 86 y.o. female with medical history significant of with history of CHF, degenerative disc disease, diabetes mellitus type 2, hypertension, hyperlipidemia, postoperative nausea and vomiting, and more presents the ED with a chief complaint of mechanical fall.  Patient reports that she was bringing her trash can and then she tripped and fell and landed on her left side.  She had immediate sharp pain 10 out of 10.  The pain was so bad that she had shortness of breath, and nausea and vomiting associated with that.  The emesis was nonbloody.  Patient reports no dizziness, chest pain, shortness of breath, or palpitations prior to the fall.  Patient reports that her shortness of breath improved after her pain was controlled upon arriving in the ED.  At the time of my exam patient reports her pain is 10 out of 10.  She had just received morphine, but had not had time to take effect yet.  Patient reports that leading up to today she has been in her totally normal state of health.  She has no other complaints at this time. ? ?Patient does not smoke, does not drink, does not use illicit drugs.  She is vaccinated for COVID.  Patient is DNR. ?Review of Systems: As mentioned in the history of present illness. All other systems reviewed and are negative. ?Past Medical History:  ?Diagnosis Date  ? Arthritis   ? OA  ? CHF (congestive heart failure) ([place])   ? DDD (degenerative disc disease), lumbosacral   ? Diabetes mellitus without complication ([place])   ? Distal radius fracture   ? bilateral  ? GERD (gastroesophageal reflux disease)   ? Glaucoma   ? HOH (hard of hearing)   ? left ear  ? Hypertension   ? PONV  (postoperative nausea and vomiting)   ? ?Past Surgical History:  ?Procedure Laterality Date  ? ABDOMINAL HYSTERECTOMY    ? BREAST BIOPSY    ? BREAST EXCISIONAL BIOPSY Left   ? benign  ? BREAST SURGERY    ? left milk duct   ? EYE SURGERY    ? cataract  ? FRACTURE SURGERY Left   ? hip fracture  ? OPEN REDUCTION INTERNAL FIXATION (ORIF) DISTAL RADIAL FRACTURE Bilateral 05/24/2015  ? Procedure: OPEN REDUCTION INTERNAL FIXATION (ORIF) BILATERAL DISTAL RADIAL FRACTURE VOLAR PLATE;  Surgeon: Daryll Brod, MD;  Location: [facility];  Service: Orthopedics;  Laterality: Bilateral;  ? ?Social History:  reports that she has never smoked. She has never used smokeless tobacco. She reports that she does not drink alcohol and does not use drugs. ? ?Allergies  ?Allergen Reactions  ? Codeine Nausea Only  ? ? ?Family History  ?Problem Relation Age of Onset  ? Heart disease Mother   ? Heart disease Father   ? ? ?Prior to Admission medications   ?Medication Sig Start Date End Date Taking? Authorizing Provider  ?albuterol (VENTOLIN HFA) 108 (90 Base) MCG/ACT inhaler Inhale 2 puffs into the lungs every 6 (six) hours as needed for wheezing or shortness of breath. 08/03/21  Yes [name] M, DO  ?amLODipine (NORVASC) 10 MG tablet Take 1 tablet (10 mg total) by  mouth daily. 04/20/21  Yes [name] M, DO  ?aspirin 81 MG tablet Take 81 mg by mouth daily.   Yes [provider]  ?atorvastatin (LIPITOR) 20 MG tablet Take 1 tablet (20 mg total) by mouth daily. 04/20/21  Yes [name] M, DO  ?calcium-vitamin D (OSCAL WITH D) 500-200 MG-UNIT tablet Take 1 tablet by mouth.   Yes [provider]  ?Cetirizine HCl (ZYRTEC ALLERGY) 10 MG CAPS Take 1 capsule by mouth daily.   Yes [provider]  ?DEXILANT 60 MG capsule Take 1 capsule by mouth daily. 02/13/20  Yes [provider]  ?fluticasone (FLONASE) 50 MCG/ACT nasal spray Place 2 sprays into both nostrils daily. 08/03/21  Yes  [name] M, DO  ?latanoprost (XALATAN) 0.005 % ophthalmic solution 1 drop at bedtime.   Yes [provider]  ?lisinopril (ZESTRIL) 40 MG tablet Take 1 tablet by mouth once daily 07/18/21  Yes [name] M, DO  ?Magnesium Oxide 400 (240 Mg) MG TABS Take 1 tablet (400 mg total) by mouth daily. 08/16/20  Yes Tat, Shanon Brow, MD  ?multivitamin-iron-minerals-folic acid (CENTRUM) chewable tablet Chew 1 tablet by mouth daily.   Yes [provider]  ?sitaGLIPtin-metformin (JANUMET) 50-1000 MG tablet Take 0.5 tablets by mouth 2 (two) times daily with a meal. ?Patient taking differently: Take 1 tablet by mouth 2 (two) times daily with a meal. 04/13/21  Yes Gottschalk, Ashly M, DO  ?timolol (TIMOPTIC) 0.5 % ophthalmic solution Place 1 drop into both eyes daily. 01/27/19  Yes [provider]  ?torsemide (DEMADEX) 20 MG tablet Take 2 tablets (40 mg total) by mouth daily. 01/12/21  Yes Josue Hector, MD  ?Elastic Bandages & Supports (LUMBAR BACK BRACE/SUPPORT PAD) MISC Velcro closing back brace.  Use as needed for back pain.  Use no more than 1 time per week. 07/19/20   Janora Norlander, DO  ?glucose blood (ONETOUCH ULTRA) test strip Test BS twice daily Dx E11.9 04/06/20   [name] M, DO  ?potassium chloride (KLOR-CON) 10 MEQ tablet Take 1 tablet (10 mEq total) by mouth daily. ?Patient not taking: Reported on 08/05/2021 12/13/19   Murlean Iba, MD  ?sildenafil (REVATIO) 20 MG tablet Take 1 tablet (20 mg total) by mouth 3 (three) times daily. ?Patient not taking: Reported on 08/05/2021 09/20/20   Josue Hector, MD  ?traZODone (DESYREL) 50 MG tablet Take 0.5-1 tablets (25-50 mg total) by mouth at bedtime as needed for sleep. ?Patient not taking: Reported on 08/05/2021 06/28/20   Janora Norlander, DO  ? ? ?Physical Exam: ?Vitals:  ? 08/05/21 1835 08/05/21 1900 08/05/21 1930 08/05/21 2000  ?BP: (!) 158/52 (!) 148/50 (!) 169/62 (!) 158/58  ?Pulse: (!) 101 71 (!) 118 (!) 105  ?Resp: 17  19 (!) 25 (!) 28  ?Temp:      ?TempSrc:      ?SpO2: 100% 100% 99% 100%  ?Weight:      ?Height:      ? ?1.  General: ?Patient lying supine in bed,  no acute distress ?  ?2. Psychiatric: ?Alert and oriented x 3, mood and behavior normal for situation, pleasant and cooperative with exam ?  ?3. Neurologic: ?Speech and language are normal, face is symmetric, moves all 4 extremities voluntarily, at baseline without acute deficits on limited exam ?  ?4. HEENMT:  ?Head is atraumatic, normocephalic, pupils reactive to light, neck is supple, trachea is midline, mucous membranes are moist ?  ?5. Respiratory : ?Lungs are  clear to auscultation bilaterally without wheezing, rhonchi, rales, no cyanosis, no increase in work of breathing or accessory muscle use ?  ?6. Cardiovascular : ?Heart rate normal, rhythm is irregular, no murmurs, rubs or gallops, no peripheral edema, peripheral pulses palpated ?  ?7. Gastrointestinal:  ?Abdomen is soft, nondistended, nontender to palpation bowel sounds active, no masses or organomegaly palpated ?  ?8. Skin:  ?Skin is warm, dry and intact without rashes, acute lesions, or ulcers on limited exam, chronic venous stasis changes of the lower extremities ?  ?9.Musculoskeletal:  ?Left lower extremity is shorter than right, and externally rotated ? ?Data Reviewed: ?In the ED ?Temp 98.2, heart rate 71-118, respiratory rate 17-20, blood pressure 158/58, satting at 92% on room air ?Chest x-ray shows no definite acute lung process ?Leukocytosis 14.5, hemoglobin 10.4, it was 331 ?Patient was typed and screened a positive blood ?Chemistry is unremarkable aside from hyperglycemia ?Left hip x-ray shows displaced intertrochanteric fracture and nondisplaced acute superior left pubic ramus fracture ?X-ray left humerus shows no acute fracture ?X-ray left shoulder shows no acute fracture ?EKG shows a heart rate of 101, sinus arrhythmia with a right bundle branch block ?Admission requested for further management  of left hip fracture ? ?Assessment and Plan: ?* Intertrochanteric fracture of left femur, closed, initial encounter ([place]) ?2/2 mechanical fall ?-anesthesia at [facility] rec'd xfer to [facility] ?-Dr. Tamera Punt at [facility] plans to see patien

## 2021-08-05 NOTE — Assessment & Plan Note (Signed)
Hold janumet ?Sliding scale coverage ?Monitor CBGs ?

## 2021-08-05 NOTE — Assessment & Plan Note (Signed)
Continue amlodipine ?Continue lisinopril ? ?

## 2021-08-05 NOTE — Assessment & Plan Note (Signed)
2/2 mechanical fall ?-anesthesia at [facility] rec'd xfer to [facility] ?-Dr. Tamera Punt at [facility] plans to see patient in the AM ?-Pain control with tylenol, oxycodone and morphine ?-NPO after midnight ?-UA as part of pre-op workup ?-Continue to monitor ?

## 2021-08-05 NOTE — ED Triage Notes (Signed)
Pt arrives via EMS from home. Pt was moving her trash can from her yard and had a mechanical fall. Pt states when she turned around she accidentally fell on her left side. Pt presents to the ED alert and oriented x 4. Pt c/o left shoulder pain and left hip pain. Shortening and rotation noted to left leg. Cms is intact. Pt denies hitting her head, no loc, no blood thinners.  ?

## 2021-08-05 NOTE — Assessment & Plan Note (Signed)
Continue statin. 

## 2021-08-05 NOTE — Assessment & Plan Note (Signed)
Likely acute phase reactant in the setting of recent hip fracture ?No infectious symptoms ?Trend in the a.m. ?

## 2021-08-06 ENCOUNTER — Emergency Department (HOSPITAL_COMMUNITY): Payer: Medicare Other | Admitting: Anesthesiology

## 2021-08-06 ENCOUNTER — Encounter (HOSPITAL_COMMUNITY): Payer: Self-pay | Admitting: Family Medicine

## 2021-08-06 ENCOUNTER — Other Ambulatory Visit: Payer: Self-pay

## 2021-08-06 ENCOUNTER — Emergency Department (HOSPITAL_COMMUNITY): Payer: Medicare Other

## 2021-08-06 ENCOUNTER — Encounter (HOSPITAL_COMMUNITY)
Admission: EM | Disposition: A | Discharge status: Skilled Nursing Facility | Payer: Self-pay | Source: Home / Self Care | Attending: Family Medicine

## 2021-08-06 DIAGNOSIS — I2510 Atherosclerotic heart disease of native coronary artery without angina pectoris: Secondary | ICD-10-CM

## 2021-08-06 DIAGNOSIS — I110 Hypertensive heart disease with heart failure: Secondary | ICD-10-CM

## 2021-08-06 DIAGNOSIS — I509 Heart failure, unspecified: Secondary | ICD-10-CM

## 2021-08-06 DIAGNOSIS — S72142A Displaced intertrochanteric fracture of left femur, initial encounter for closed fracture: Secondary | ICD-10-CM

## 2021-08-06 HISTORY — PX: INTRAMEDULLARY (IM) NAIL INTERTROCHANTERIC: SHX5875

## 2021-08-06 LAB — GLUCOSE, CAPILLARY
Glucose-Capillary: 168 mg/dL — ABNORMAL HIGH (ref 70–99)
Glucose-Capillary: 134 mg/dL — ABNORMAL HIGH (ref 70–99)
Glucose-Capillary: 174 mg/dL — ABNORMAL HIGH (ref 70–99)
Glucose-Capillary: 270 mg/dL — ABNORMAL HIGH (ref 70–99)
Glucose-Capillary: 300 mg/dL — ABNORMAL HIGH (ref 70–99)

## 2021-08-06 LAB — CBG MONITORING, ED: Glucose-Capillary: 190 mg/dL — ABNORMAL HIGH (ref 70–99)

## 2021-08-06 LAB — SURGICAL PCR SCREEN
MRSA, PCR: NEGATIVE
Staphylococcus aureus: NEGATIVE

## 2021-08-06 SURGERY — FIXATION, FRACTURE, INTERTROCHANTERIC, WITH INTRAMEDULLARY ROD
Anesthesia: General | Laterality: Left

## 2021-08-06 MED ORDER — MORPHINE SULFATE (PF) 2 MG/ML IV SOLN: 1.0000 mg | INTRAVENOUS | Status: DC | PRN

## 2021-08-06 MED ORDER — ONDANSETRON HCL 4 MG/2ML IJ SOLN
INTRAMUSCULAR | Status: AC
Start: 2021-08-06 — End: 2021-08-07
  Filled 2021-08-06: qty 2

## 2021-08-06 MED ORDER — LIDOCAINE 2% (20 MG/ML) 5 ML SYRINGE
INTRAMUSCULAR | Status: DC | PRN
  Administered 2021-08-06: 50 mg via INTRAVENOUS

## 2021-08-06 MED ORDER — PHENYLEPHRINE 40 MCG/ML (10ML) SYRINGE FOR IV PUSH (FOR BLOOD PRESSURE SUPPORT)
PREFILLED_SYRINGE | INTRAVENOUS | Status: DC | PRN
  Administered 2021-08-06: 120 ug via INTRAVENOUS

## 2021-08-06 MED ORDER — CHLORHEXIDINE GLUCONATE 0.12 % MT SOLN
OROMUCOSAL | Status: AC
  Administered 2021-08-06: 15 mL via OROMUCOSAL
  Filled 2021-08-06: qty 15

## 2021-08-06 MED ORDER — FENTANYL CITRATE (PF) 250 MCG/5ML IJ SOLN
INTRAMUSCULAR | Status: AC
Start: 2021-08-06 — End: ?
  Filled 2021-08-06: qty 5

## 2021-08-06 MED ORDER — TRAZODONE HCL 50 MG PO TABS: 25.0000 mg | ORAL_TABLET | Freq: Every evening | ORAL | Status: DC | PRN

## 2021-08-06 MED ORDER — FENTANYL CITRATE (PF) 250 MCG/5ML IJ SOLN
INTRAMUSCULAR | Status: DC | PRN
  Administered 2021-08-06: 150 ug via INTRAVENOUS
  Administered 2021-08-06 (×2): 50 ug via INTRAVENOUS

## 2021-08-06 MED ORDER — ALBUTEROL SULFATE (2.5 MG/3ML) 0.083% IN NEBU: 2.5000 mg | INHALATION_SOLUTION | Freq: Four times a day (QID) | RESPIRATORY_TRACT | Status: DC | PRN

## 2021-08-06 MED ORDER — ONDANSETRON HCL 4 MG/2ML IJ SOLN
4.0000 mg | Freq: Once | INTRAMUSCULAR | Status: AC | PRN
  Administered 2021-08-06: 4 mg via INTRAVENOUS

## 2021-08-06 MED ORDER — LISINOPRIL 20 MG PO TABS
40.0000 mg | ORAL_TABLET | Freq: Every day | ORAL | Status: DC
  Administered 2021-08-07: 40 mg via ORAL
  Filled 2021-08-06: qty 2

## 2021-08-06 MED ORDER — TRANEXAMIC ACID-NACL 1000-0.7 MG/100ML-% IV SOLN
1000.0000 mg | INTRAVENOUS | Status: AC
  Administered 2021-08-06: 1000 mg via INTRAVENOUS

## 2021-08-06 MED ORDER — PROPOFOL 10 MG/ML IV BOLUS
INTRAVENOUS | Status: DC | PRN
  Administered 2021-08-06: 50 mg via INTRAVENOUS

## 2021-08-06 MED ORDER — CEFAZOLIN SODIUM-DEXTROSE 2-4 GM/100ML-% IV SOLN
INTRAVENOUS | Status: AC
  Filled 2021-08-06: qty 100

## 2021-08-06 MED ORDER — DEXAMETHASONE SODIUM PHOSPHATE 10 MG/ML IJ SOLN
INTRAMUSCULAR | Status: DC | PRN
Start: 2021-08-06 — End: 2021-08-06
  Administered 2021-08-06: 4 mg via INTRAVENOUS

## 2021-08-06 MED ORDER — PHENOL 1.4 % MT LIQD: 1.0000 | OROMUCOSAL | Status: DC | PRN

## 2021-08-06 MED ORDER — CHLORHEXIDINE GLUCONATE 0.12 % MT SOLN: 15.0000 mL | Freq: Once | OROMUCOSAL | Status: AC

## 2021-08-06 MED ORDER — ONDANSETRON HCL 4 MG PO TABS: 4.0000 mg | ORAL_TABLET | Freq: Four times a day (QID) | ORAL | Status: DC | PRN

## 2021-08-06 MED ORDER — ACETAMINOPHEN 10 MG/ML IV SOLN: 1000.0000 mg | Freq: Once | INTRAVENOUS | Status: DC | PRN

## 2021-08-06 MED ORDER — AMLODIPINE BESYLATE 10 MG PO TABS
10.0000 mg | ORAL_TABLET | Freq: Every day | ORAL | Status: DC
  Administered 2021-08-07: 10 mg via ORAL
  Filled 2021-08-06: qty 1

## 2021-08-06 MED ORDER — FENTANYL CITRATE (PF) 100 MCG/2ML IJ SOLN
INTRAMUSCULAR | Status: AC
  Administered 2021-08-06: 25 ug
  Filled 2021-08-06: qty 2

## 2021-08-06 MED ORDER — SUGAMMADEX SODIUM 200 MG/2ML IV SOLN
INTRAVENOUS | Status: DC | PRN
  Administered 2021-08-06: 200 mg via INTRAVENOUS

## 2021-08-06 MED ORDER — AMISULPRIDE (ANTIEMETIC) 5 MG/2ML IV SOLN
10.0000 mg | Freq: Once | INTRAVENOUS | Status: AC | PRN
  Administered 2021-08-06: 10 mg via INTRAVENOUS

## 2021-08-06 MED ORDER — ALBUTEROL SULFATE HFA 108 (90 BASE) MCG/ACT IN AERS: 2.0000 | INHALATION_SPRAY | Freq: Four times a day (QID) | RESPIRATORY_TRACT | Status: DC | PRN

## 2021-08-06 MED ORDER — LIDOCAINE 2% (20 MG/ML) 5 ML SYRINGE
INTRAMUSCULAR | Status: AC
  Filled 2021-08-06: qty 5

## 2021-08-06 MED ORDER — ROPIVACAINE HCL 5 MG/ML IJ SOLN
INTRAMUSCULAR | Status: DC | PRN
  Administered 2021-08-06: 30 mL via PERINEURAL

## 2021-08-06 MED ORDER — ACETAMINOPHEN 650 MG RE SUPP: 650.0000 mg | Freq: Four times a day (QID) | RECTAL | Status: DC | PRN

## 2021-08-06 MED ORDER — HEPARIN SODIUM (PORCINE) 5000 UNIT/ML IJ SOLN
5000.0000 [IU] | Freq: Three times a day (TID) | INTRAMUSCULAR | Status: DC
  Administered 2021-08-06 – 2021-08-10 (×11): 5000 [IU] via SUBCUTANEOUS
  Filled 2021-08-06 (×11): qty 1

## 2021-08-06 MED ORDER — ATORVASTATIN CALCIUM 10 MG PO TABS: 20.0000 mg | ORAL_TABLET | Freq: Every day | ORAL | Status: DC

## 2021-08-06 MED ORDER — ONDANSETRON HCL 4 MG/2ML IJ SOLN
INTRAMUSCULAR | Status: AC
  Filled 2021-08-06: qty 2

## 2021-08-06 MED ORDER — 0.9 % SODIUM CHLORIDE (POUR BTL) OPTIME
TOPICAL | Status: DC | PRN
  Administered 2021-08-06: 1000 mL

## 2021-08-06 MED ORDER — DEXAMETHASONE SODIUM PHOSPHATE 10 MG/ML IJ SOLN
INTRAMUSCULAR | Status: AC
  Filled 2021-08-06: qty 1

## 2021-08-06 MED ORDER — ACETAMINOPHEN 325 MG PO TABS
650.0000 mg | ORAL_TABLET | Freq: Four times a day (QID) | ORAL | Status: DC | PRN
  Administered 2021-08-07 – 2021-08-08 (×4): 650 mg via ORAL
  Filled 2021-08-06 (×5): qty 2

## 2021-08-06 MED ORDER — METOCLOPRAMIDE HCL 5 MG PO TABS: 5.0000 mg | ORAL_TABLET | Freq: Three times a day (TID) | ORAL | Status: DC | PRN

## 2021-08-06 MED ORDER — ASPIRIN EC 81 MG PO TBEC
81.0000 mg | DELAYED_RELEASE_TABLET | Freq: Every day | ORAL | Status: DC
Start: 2021-08-07 — End: 2021-08-06

## 2021-08-06 MED ORDER — LACTATED RINGERS IV SOLN: INTRAVENOUS | Status: DC

## 2021-08-06 MED ORDER — TRAMADOL HCL 50 MG PO TABS
50.0000 mg | ORAL_TABLET | Freq: Four times a day (QID) | ORAL | Status: DC | PRN
Start: 2021-08-06 — End: 2021-08-10

## 2021-08-06 MED ORDER — METOCLOPRAMIDE HCL 5 MG/ML IJ SOLN: 5.0000 mg | Freq: Three times a day (TID) | INTRAMUSCULAR | Status: DC | PRN

## 2021-08-06 MED ORDER — CEFAZOLIN SODIUM-DEXTROSE 2-4 GM/100ML-% IV SOLN
2.0000 g | Freq: Four times a day (QID) | INTRAVENOUS | Status: AC
  Administered 2021-08-06 – 2021-08-07 (×2): 2 g via INTRAVENOUS
  Filled 2021-08-06 (×2): qty 100

## 2021-08-06 MED ORDER — FENTANYL CITRATE (PF) 100 MCG/2ML IJ SOLN: 25.0000 ug | INTRAMUSCULAR | Status: DC | PRN

## 2021-08-06 MED ORDER — CEFAZOLIN SODIUM-DEXTROSE 2-4 GM/100ML-% IV SOLN
2.0000 g | INTRAVENOUS | Status: AC
  Administered 2021-08-06: 2 g via INTRAVENOUS

## 2021-08-06 MED ORDER — ONDANSETRON HCL 4 MG/2ML IJ SOLN
INTRAMUSCULAR | Status: DC | PRN
  Administered 2021-08-06: 4 mg via INTRAVENOUS

## 2021-08-06 MED ORDER — ONDANSETRON HCL 4 MG/2ML IJ SOLN
4.0000 mg | Freq: Four times a day (QID) | INTRAMUSCULAR | Status: DC | PRN
  Administered 2021-08-06: 4 mg via INTRAVENOUS
  Filled 2021-08-06: qty 2

## 2021-08-06 MED ORDER — PANTOPRAZOLE SODIUM 40 MG PO TBEC
40.0000 mg | DELAYED_RELEASE_TABLET | Freq: Every day | ORAL | Status: DC
  Administered 2021-08-07 – 2021-08-10 (×4): 40 mg via ORAL
  Filled 2021-08-06 (×4): qty 1

## 2021-08-06 MED ORDER — MENTHOL 3 MG MT LOZG: 1.0000 | LOZENGE | OROMUCOSAL | Status: DC | PRN

## 2021-08-06 MED ORDER — ASPIRIN EC 81 MG PO TBEC
81.0000 mg | DELAYED_RELEASE_TABLET | Freq: Every day | ORAL | Status: DC
  Administered 2021-08-07 – 2021-08-10 (×4): 81 mg via ORAL
  Filled 2021-08-06 (×4): qty 1

## 2021-08-06 MED ORDER — TRANEXAMIC ACID-NACL 1000-0.7 MG/100ML-% IV SOLN
INTRAVENOUS | Status: AC
  Filled 2021-08-06: qty 100

## 2021-08-06 MED ORDER — AMISULPRIDE (ANTIEMETIC) 5 MG/2ML IV SOLN
INTRAVENOUS | Status: AC
  Filled 2021-08-06: qty 4

## 2021-08-06 MED ORDER — INSULIN ASPART 100 UNIT/ML IJ SOLN: 0.0000 [IU] | INTRAMUSCULAR | Status: DC | PRN

## 2021-08-06 MED ORDER — POVIDONE-IODINE 10 % EX SWAB
2.0000 "application " | Freq: Once | CUTANEOUS | Status: AC
  Administered 2021-08-06: 2 via TOPICAL

## 2021-08-06 MED ORDER — ROCURONIUM BROMIDE 10 MG/ML (PF) SYRINGE
PREFILLED_SYRINGE | INTRAVENOUS | Status: AC
  Filled 2021-08-06: qty 20

## 2021-08-06 MED ORDER — FENTANYL CITRATE PF 50 MCG/ML IJ SOSY: 25.0000 ug | PREFILLED_SYRINGE | Freq: Once | INTRAMUSCULAR | Status: DC

## 2021-08-06 MED ORDER — FLUTICASONE PROPIONATE 50 MCG/ACT NA SUSP
2.0000 | Freq: Every day | NASAL | Status: DC
  Administered 2021-08-07 – 2021-08-10 (×4): 2 via NASAL
  Filled 2021-08-06: qty 16

## 2021-08-06 MED ORDER — VITAMIN D (ERGOCALCIFEROL) 1.25 MG (50000 UNIT) PO CAPS
50000.0000 [IU] | ORAL_CAPSULE | ORAL | Status: DC
  Administered 2021-08-07: 50000 [IU] via ORAL
  Filled 2021-08-06: qty 1

## 2021-08-06 MED ORDER — OXYCODONE HCL 5 MG PO TABS
5.0000 mg | ORAL_TABLET | ORAL | Status: DC | PRN
  Administered 2021-08-06 – 2021-08-10 (×12): 5 mg via ORAL
  Filled 2021-08-06 (×13): qty 1

## 2021-08-06 MED ORDER — DOCUSATE SODIUM 100 MG PO CAPS
100.0000 mg | ORAL_CAPSULE | Freq: Two times a day (BID) | ORAL | Status: DC
  Administered 2021-08-06 – 2021-08-07 (×2): 100 mg via ORAL
  Filled 2021-08-06 (×2): qty 1

## 2021-08-06 MED ORDER — ROCURONIUM BROMIDE 10 MG/ML (PF) SYRINGE
PREFILLED_SYRINGE | INTRAVENOUS | Status: DC | PRN
  Administered 2021-08-06: 30 mg via INTRAVENOUS

## 2021-08-06 MED ORDER — CHLORHEXIDINE GLUCONATE 4 % EX LIQD: 60.0000 mL | Freq: Once | CUTANEOUS | Status: DC

## 2021-08-06 MED ORDER — PHENYLEPHRINE 40 MCG/ML (10ML) SYRINGE FOR IV PUSH (FOR BLOOD PRESSURE SUPPORT)
PREFILLED_SYRINGE | INTRAVENOUS | Status: AC
  Filled 2021-08-06: qty 30

## 2021-08-06 MED ORDER — INSULIN ASPART 100 UNIT/ML IJ SOLN
0.0000 [IU] | Freq: Three times a day (TID) | INTRAMUSCULAR | Status: DC
  Administered 2021-08-06 – 2021-08-07 (×2): 3 [IU] via SUBCUTANEOUS
  Administered 2021-08-07: 11 [IU] via SUBCUTANEOUS
  Administered 2021-08-07 – 2021-08-08 (×2): 3 [IU] via SUBCUTANEOUS
  Administered 2021-08-08 – 2021-08-09 (×2): 2 [IU] via SUBCUTANEOUS
  Administered 2021-08-09: 3 [IU] via SUBCUTANEOUS
  Administered 2021-08-09: 2 [IU] via SUBCUTANEOUS
  Administered 2021-08-10: 5 [IU] via SUBCUTANEOUS

## 2021-08-06 MED ORDER — METOPROLOL TARTRATE 12.5 MG HALF TABLET
12.5000 mg | ORAL_TABLET | Freq: Two times a day (BID) | ORAL | Status: DC
  Administered 2021-08-06 – 2021-08-09 (×7): 12.5 mg via ORAL
  Filled 2021-08-06 (×5): qty 1
  Filled 2021-08-06: qty 0.5
  Filled 2021-08-06: qty 1

## 2021-08-06 SURGICAL SUPPLY — 43 items
APL PRP STRL LF DISP 70% ISPRP (MISCELLANEOUS) ×1
BAG COUNTER SPONGE SURGICOUNT (BAG) ×2 IMPLANT
BAG SPNG CNTER NS LX DISP (BAG) ×1
BNDG COHESIVE 4X5 TAN STRL (GAUZE/BANDAGES/DRESSINGS) ×3 IMPLANT
CHLORAPREP W/TINT 26 (MISCELLANEOUS) ×2 IMPLANT
COVER PERINEAL POST (MISCELLANEOUS) ×2 IMPLANT
COVER SURGICAL LIGHT HANDLE (MISCELLANEOUS) ×2 IMPLANT
DRAPE STERI IOBAN 125X83 (DRAPES) ×2 IMPLANT
DRESSING MEPILEX FLEX 4X4 (GAUZE/BANDAGES/DRESSINGS) IMPLANT
DRSG MEPILEX BORDER 4X4 (GAUZE/BANDAGES/DRESSINGS) ×6 IMPLANT
DRSG MEPILEX FLEX 4X4 (GAUZE/BANDAGES/DRESSINGS) ×6
ELECT REM PT RETURN 9FT ADLT (ELECTROSURGICAL) ×2
ELECTRODE REM PT RTRN 9FT ADLT (ELECTROSURGICAL) ×1 IMPLANT
GAUZE SPONGE 2X2 8PLY STRL LF (GAUZE/BANDAGES/DRESSINGS) IMPLANT
GAUZE XEROFORM 1X8 LF (GAUZE/BANDAGES/DRESSINGS) ×1 IMPLANT
GLOVE SRG 8 PF TXTR STRL LF DI (GLOVE) ×1 IMPLANT
GLOVE SURG ENC MOIS LTX SZ7 (GLOVE) ×1 IMPLANT
GLOVE SURG ENC MOIS LTX SZ7.5 (GLOVE) ×2 IMPLANT
GLOVE SURG UNDER POLY LF SZ8 (GLOVE) ×2
GOWN STRL REUS W/ TWL LRG LVL3 (GOWN DISPOSABLE) ×2 IMPLANT
GOWN STRL REUS W/TWL LRG LVL3 (GOWN DISPOSABLE) ×6
GUIDEPIN VERSANAIL DSP 3.2X444 (ORTHOPEDIC DISPOSABLE SUPPLIES) ×1 IMPLANT
GUIDEWIRE NATURAL NAIL 3X100 (WIRE) ×1 IMPLANT
HFN LH 130 DEG 9MM X 360MM (Nail) ×1 IMPLANT
HIP FRAC NAIL LAG SCR 10.5X100 (Orthopedic Implant) ×2 IMPLANT
KIT TURNOVER KIT B (KITS) ×2 IMPLANT
MANIFOLD NEPTUNE II (INSTRUMENTS) ×1 IMPLANT
NS IRRIG 1000ML POUR BTL (IV SOLUTION) ×2 IMPLANT
PACK GENERAL/GYN (CUSTOM PROCEDURE TRAY) ×2 IMPLANT
PAD ARMBOARD 7.5X6 YLW CONV (MISCELLANEOUS) ×4 IMPLANT
SCREW BONE CORTICAL 5.0X36 (Screw) ×1 IMPLANT
SCREW CANN THRD AFF 10.5X100 (Orthopedic Implant) IMPLANT
SPONGE GAUZE 2X2 STER 10/PKG (GAUZE/BANDAGES/DRESSINGS) ×1
STAPLER VISISTAT 35W (STAPLE) ×2 IMPLANT
SUT VIC AB 0 CT1 27 (SUTURE) ×2
SUT VIC AB 0 CT1 27XBRD ANBCTR (SUTURE) IMPLANT
SUT VIC AB 1 CT1 27 (SUTURE)
SUT VIC AB 1 CT1 27XBRD ANBCTR (SUTURE) ×1 IMPLANT
SUT VIC AB 2-0 CT1 27 (SUTURE) ×2
SUT VIC AB 2-0 CT1 TAPERPNT 27 (SUTURE) ×1 IMPLANT
TOWEL GREEN STERILE (TOWEL DISPOSABLE) ×2 IMPLANT
TOWEL GREEN STERILE FF (TOWEL DISPOSABLE) ×2 IMPLANT
WATER STERILE IRR 1000ML POUR (IV SOLUTION) ×1 IMPLANT

## 2021-08-06 NOTE — Anesthesia Procedure Notes (Signed)
Anesthesia Regional Block: Peng block  ? ?Pre-Anesthetic Checklist: , timeout performed,  Correct Patient, Correct Site, Correct Laterality,  Correct Procedure, Correct Position, site marked,  Risks and benefits discussed,  Surgical consent,  Pre-op evaluation,  At surgeon's request and post-op pain management ? ?Laterality: Left ? ?Prep: chloraprep     ?  ?Needles:  ?Injection technique: Single-shot ? ?Needle Type: Echogenic Stimulator Needle   ? ? ?Needle Length: 10cm  ?Needle Gauge: 20  ? ? ? ?Additional Needles: ? ? ?Procedures:,,,, ultrasound used (permanent image in chart),,    ?Narrative:  ?Start time: 08/06/2021 11:40 AM ?End time: 08/06/2021 11:50 AM ?Injection made incrementally with aspirations every 5 mL. ? ?Performed by: Personally  ?Anesthesiologist: Murvin Natal, MD ? ?Additional Notes: ?Functioning IV was confirmed and monitors were applied.  A timeout was performed. Sterile prep, hand hygiene and sterile gloves were used. A 12m 20ga Bbraun echogenic stimulator needle was used. Negative aspiration and negative test dose prior to incremental administration of local anesthetic. The patient tolerated the procedure well. ? ?Ultrasound guidance: relevent anatomy identified, needle position confirmed, local anesthetic spread visualized around nerve(s), vascular puncture avoided.  Image printed for medical record.  ? ? ? ? ? ?

## 2021-08-06 NOTE — Anesthesia Preprocedure Evaluation (Addendum)
Anesthesia Evaluation  ?Patient identified by MRN, date of birth, ID band ?Patient awake ? ? ? ?Reviewed: ?Allergy & Precautions, NPO status  ? ?History of Anesthesia Complications ?(+) PONV and history of anesthetic complications ? ?Airway ?Mallampati: II ? ?TM Distance: >3 FB ?Neck ROM: Full ? ? ? Dental ? ?(+) Edentulous Upper, Edentulous Lower ?  ?Pulmonary ? ?  ?Pulmonary exam normal ? ? ? ? ? ? ? Cardiovascular ?hypertension, Pt. on medications ?pulmonary hypertension+ CAD and +CHF  ?Normal cardiovascular exam ? ?ECHO: ?Left ventricular ejection fraction, by estimation, is 70 to 75%. The left ventricle has hyperdynamic function. The left ventricle has no regional wall motion abnormalities. There is moderate left ventricular hypertrophy. Left ventricular diastolic  ?parameters are indeterminate.  ??Right ventricular systolic function is normal. The right ventricular size is normal. There is severely elevated pulmonary artery systolic pressure. The estimated right ventricular systolic pressure is 83.3 mmHg.  ?Left atrial size was moderately dilated.  ?The mitral valve is abnormal. Moderate mitral valve regurgitation.  ?Severe mitral annular calcification.  ?The aortic valve is tricuspid. Aortic valve regurgitation is not visualized.  ?Pulmonic valve regurgitation is moderate.  ?The inferior vena cava is normal in size with <50% respiratory variability, suggesting right atrial pressure of 8 mmHg. ?  ?Neuro/Psych ?PSYCHIATRIC DISORDERS Anxiety   ? GI/Hepatic ?GERD  Medicated and Controlled,  ?Endo/Other  ?diabetes, Oral Hypoglycemic Agents ? Renal/GU ?  ? ?  ?Musculoskeletal ? ?(+) Arthritis ,  ? Abdominal ?  ?Peds ? Hematology ? ?(+) Blood dyscrasia, anemia ,   ?Anesthesia Other Findings ?LEFT HIP INTERTROCH FEMUR FRACTURE ? Reproductive/Obstetrics ? ?  ? ? ? ? ? ? ? ? ? ? ? ? ? ?  ?  ? ? ? ? ? ? ?Anesthesia Physical ?Anesthesia Plan ? ?ASA: 3 ? ?Anesthesia Plan: General   ? ?Post-op Pain Management:   ? ?Induction: Intravenous ? ?PONV Risk Score and Plan: 4 or greater and Ondansetron, Dexamethasone and Treatment may vary due to age or medical condition ? ?Airway Management Planned:  ? ?Additional Equipment:  ? ?Intra-op Plan:  ? ?Post-operative Plan: Extubation in OR ? ?Informed Consent: I have reviewed the patients History and Physical, chart, labs and discussed the procedure including the risks, benefits and alternatives for the proposed anesthesia with the patient or authorized representative who has indicated his/her understanding and acceptance.  ? ?Patient has DNR.  ?Discussed DNR with patient and Suspend DNR. ?  ?Dental advisory given ? ?Plan Discussed with: CRNA ? ?Anesthesia Plan Comments:   ? ? ? ? ? ? ?Anesthesia Quick Evaluation ? ?

## 2021-08-06 NOTE — Anesthesia Procedure Notes (Signed)
Procedure Name: Intubation ?Date/Time: 08/06/2021 3:25 PM ?Performed by: Inda Coke, CRNA ?Pre-anesthesia Checklist: Patient identified, Emergency Drugs available, Suction available and Patient being monitored ?Patient Re-evaluated:Patient Re-evaluated prior to induction ?Oxygen Delivery Method: Circle System Utilized ?Preoxygenation: Pre-oxygenation with 100% oxygen ?Induction Type: IV induction ?Ventilation: Mask ventilation without difficulty ?Laryngoscope Size: Mac and 3 ?Grade View: Grade I ?Tube type: Oral ?Tube size: 7.0 mm ?Number of attempts: 1 ?Airway Equipment and Method: Stylet and Oral airway ?Placement Confirmation: ETT inserted through vocal cords under direct vision, positive ETCO2 and breath sounds checked- equal and bilateral ?Secured at: 22 cm ?Tube secured with: Tape ?Dental Injury: Teeth and Oropharynx as per pre-operative assessment  ? ? ? ? ?

## 2021-08-06 NOTE — Consult Note (Signed)
Reason for Consult:left hip fracture ?Referring Physician: ED ? ?[name] is an 86 y.o. female.  ?HPI: [name] is a 86 y.o. female with medical history significant of with history of CHF, degenerative disc disease, diabetes mellitus type 2, hypertension, hyperlipidemia, postoperative nausea and vomiting. She presented to the ED yesterday with a chief complaint of a mechanical fall.  Patient reports that she was bringing in her trash can and then she tripped and fell and landed on her left side.  ? ?Past Medical History:  ?Diagnosis Date  ? Arthritis   ? OA  ? CHF (congestive heart failure) ([place])   ? DDD (degenerative disc disease), lumbosacral   ? Diabetes mellitus without complication ([place])   ? Distal radius fracture   ? bilateral  ? GERD (gastroesophageal reflux disease)   ? Glaucoma   ? HOH (hard of hearing)   ? left ear  ? Hypertension   ? PONV (postoperative nausea and vomiting)   ? ? ?Past Surgical History:  ?Procedure Laterality Date  ? ABDOMINAL HYSTERECTOMY    ? BREAST BIOPSY    ? BREAST EXCISIONAL BIOPSY Left   ? benign  ? BREAST SURGERY    ? left milk duct   ? EYE SURGERY    ? cataract  ? FRACTURE SURGERY Left   ? hip fracture  ? OPEN REDUCTION INTERNAL FIXATION (ORIF) DISTAL RADIAL FRACTURE Bilateral 05/24/2015  ? Procedure: OPEN REDUCTION INTERNAL FIXATION (ORIF) BILATERAL DISTAL RADIAL FRACTURE VOLAR PLATE;  Surgeon: Daryll Brod, MD;  Location: [facility];  Service: Orthopedics;  Laterality: Bilateral;  ? ? ?Family History  ?Problem Relation Age of Onset  ? Heart disease Mother   ? Heart disease Father   ? ? ?Social History:  reports that she has never smoked. She has never used smokeless tobacco. She reports that she does not drink alcohol and does not use drugs. ? ?Allergies:  ?Allergies  ?Allergen Reactions  ? Codeine Nausea Only  ? ? ?Medications: I have reviewed the patient's current medications. ? ?Results for orders placed or performed during the hospital encounter of  08/05/21 (from the past 48 hour(s))  ?Basic metabolic panel     Status: Abnormal  ? Collection Time: 08/05/21  6:17 PM  ?Result Value Ref Range  ? Sodium 138 135 - 145 mmol/L  ? Potassium 3.5 3.5 - 5.1 mmol/L  ? Chloride 98 98 - 111 mmol/L  ? CO2 29 22 - 32 mmol/L  ? Glucose, Bld 239 (H) 70 - 99 mg/dL  ?  Comment: Glucose reference range applies only to samples taken after fasting for at least 8 hours.  ? BUN 22 8 - 23 mg/dL  ? Creatinine, Ser 0.90 0.44 - 1.00 mg/dL  ? Calcium 8.4 (L) 8.9 - 10.3 mg/dL  ? GFR, Estimated >60 >60 mL/min  ?  Comment: (NOTE) ?Calculated using the CKD-EPI Creatinine Equation (2021) ?  ? Anion gap 11 5 - 15  ?  Comment: Performed at [facility], [address], [place], [place] [address]  ?CBC WITH DIFFERENTIAL     Status: Abnormal  ? Collection Time: 08/05/21  6:17 PM  ?Result Value Ref Range  ? WBC 14.5 (H) 4.0 - 10.5 K/uL  ? RBC 4.11 3.87 - 5.11 MIL/uL  ? Hemoglobin 10.4 (L) 12.0 - 15.0 g/dL  ? HCT 34.6 (L) 36.0 - 46.0 %  ? MCV 84.2 80.0 - 100.0 fL  ? MCH 25.3 (L) 26.0 - 34.0 pg  ?  MCHC 30.1 30.0 - 36.0 g/dL  ? RDW 14.9 11.5 - 15.5 %  ? Platelets 331 150 - 400 K/uL  ? nRBC 0.0 0.0 - 0.2 %  ? Neutrophils Relative % 83 %  ? Neutro Abs 12.1 (H) 1.7 - 7.7 K/uL  ? Lymphocytes Relative 7 %  ? Lymphs Abs 1.1 0.7 - 4.0 K/uL  ? Monocytes Relative 7 %  ? Monocytes Absolute 1.0 0.1 - 1.0 K/uL  ? Eosinophils Relative 1 %  ? Eosinophils Absolute 0.1 0.0 - 0.5 K/uL  ? Basophils Relative 1 %  ? Basophils Absolute 0.1 0.0 - 0.1 K/uL  ? Immature Granulocytes 1 %  ? Abs Immature Granulocytes 0.18 (H) 0.00 - 0.07 K/uL  ?  Comment: Performed at [facility], [address], [place], [place] [address]  ?Protime-INR     Status: None  ? Collection Time: 08/05/21  6:17 PM  ?Result Value Ref Range  ? Prothrombin Time 13.0 11.4 - 15.2 seconds  ? INR 1.0 0.8 - 1.2  ?  Comment: (NOTE) ?INR goal varies based on device and disease states. ?Performed at [facility], [address], [place], [place] [address] ?  ?Type  and screen [facility]     Status: None  ? Collection Time: 08/05/21  6:17 PM  ?Result Value Ref Range  ? ABO/RH(D) A POS   ? Antibody Screen NEG   ? Sample Expiration    ?  08/08/2021,2359 ?Performed at [facility], [address], [place],  [address] ?  ?CBG monitoring, ED     Status: Abnormal  ? Collection Time: 08/06/21  7:47 AM  ?Result Value Ref Range  ? Glucose-Capillary 190 (H) 70 - 99 mg/dL  ?  Comment: Glucose reference range applies only to samples taken after fasting for at least 8 hours.  ? ? ?DG Chest 2 View ? ?Result Date: 08/05/2021 ?CLINICAL DATA:  Mechanical fall.  Left hip acute fracture. EXAM: CHEST - 2 VIEW COMPARISON:  Chest two views 09/20/2020; CT chest 08/13/2020 FINDINGS: Moderately decreased lung volumes. Mild-to-moderate dextrocurvature of the mid to lower thoracic spine. Mild elevation of the left hemidiaphragm. The cardiac silhouette is not well evaluated due to the overlying hemidiaphragm. Mild calcification within aortic arch. Moderate diffuse chronic interstitial thickening, similar to prior. Old healed posterior left approximate sixth and seventh rib fractures. There are multiple thoracic and upper lumbar vertebral body compression fractures appearing unchanged from prior. This includes moderate anterior T11 and severe anterior T12 and L1 compression fractures. Markedly severe T7 compression is unchanged from 08/13/2020 CT chest. IMPRESSION: Limited study by low lung volumes and patient positioning. No definite acute lung process. Electronically Signed   By: Yvonne Kendall M.D.   On: 08/05/2021 18:25  ? ?DG Shoulder Left ? ?Result Date: 08/05/2021 ?CLINICAL DATA:  Fall.  Pain. EXAM: LEFT SHOULDER - 2+ VIEW; LEFT HUMERUS - 2+ VIEW COMPARISON:  Chest two views 09/20/2020, 08/13/2020 FINDINGS: Left shoulder: There is mild acromioclavicular joint space narrowing with moderate peripheral degenerative osteophytosis. The glenohumeral joint is appropriately aligned. There is  prominence of the lateral greater tuberosity with convexity of the bone margins which appears unchanged from prior shoulder radiographs and chronic. Within the limitations of diffuse decreased bone mineralization, no definite proximal humeral fracture is seen. Left humerus: Within limitations of diffuse decreased bone mineralization, no acute fracture is seen. There is mild-to-moderate spurring at the medial aspect of the trochlea. Partial visualization of distal radial plate and screw hardware. Old healed  posterior left approximate 6th or seventh rib fracture. IMPRESSION: There is chronic lateral prominence of the proximal humeral head, likely the sequela of remote trauma. No definite acute fracture is seen within the limitation of diffuse decreased bone mineralization. Electronically Signed   By: Yvonne Kendall M.D.   On: 08/05/2021 18:19  ? ?DG Humerus Left ? ?Result Date: 08/05/2021 ?CLINICAL DATA:  Fall.  Pain. EXAM: LEFT SHOULDER - 2+ VIEW; LEFT HUMERUS - 2+ VIEW COMPARISON:  Chest two views 09/20/2020, 08/13/2020 FINDINGS: Left shoulder: There is mild acromioclavicular joint space narrowing with moderate peripheral degenerative osteophytosis. The glenohumeral joint is appropriately aligned. There is prominence of the lateral greater tuberosity with convexity of the bone margins which appears unchanged from prior shoulder radiographs and chronic. Within the limitations of diffuse decreased bone mineralization, no definite proximal humeral fracture is seen. Left humerus: Within limitations of diffuse decreased bone mineralization, no acute fracture is seen. There is mild-to-moderate spurring at the medial aspect of the trochlea. Partial visualization of distal radial plate and screw hardware. Old healed posterior left approximate 6th or seventh rib fracture. IMPRESSION: There is chronic lateral prominence of the proximal humeral head, likely the sequela of remote trauma. No definite acute fracture is seen within  the limitation of diffuse decreased bone mineralization. Electronically Signed   By: Yvonne Kendall M.D.   On: 08/05/2021 18:19  ? ?DG Hip Unilat With Pelvis 2-3 Views Left ? ?Result Date: 08/05/2021 ?[facility]

## 2021-08-06 NOTE — Interval H&P Note (Signed)
History and Physical Interval Note: ? ?08/06/2021 ?2:26 PM ? ?[name]  has presented today for surgery, with the diagnosis of LEFT HIP [place].  The various methods of treatment have been discussed with the patient and family. After consideration of risks, benefits and other options for treatment, the patient has consented to  Procedure(s): ?INTRAMEDULLARY (IM) NAIL INTERTROCHANTRIC (Left) as a surgical intervention.  The patient's history has been reviewed, patient examined, no change in status, stable for surgery.  I have reviewed the patient's chart and labs.  Questions were answered to the patient's satisfaction.   ? ? ?Rhae Hammock ? ? ?

## 2021-08-06 NOTE — Anesthesia Postprocedure Evaluation (Signed)
Anesthesia Post Note ? ?Patient: [name] ? ?Procedure(s) Performed: INTRAMEDULLARY (IM) NAIL INTERTROCHANTRIC (Left) ? ?  ? ?Patient location during evaluation: PACU ?Anesthesia Type: General ?Level of consciousness: awake ?Pain management: pain level controlled ?Vital Signs Assessment: post-procedure vital signs reviewed and stable ?Respiratory status: spontaneous breathing, nonlabored ventilation, respiratory function stable and patient connected to nasal cannula oxygen ?Cardiovascular status: blood pressure returned to baseline and stable ?Postop Assessment: no apparent nausea or vomiting ?Anesthetic complications: no ? ? ?No notable events documented. ? ?Last Vitals:  ?Vitals:  ? 08/06/21 1707 08/06/21 1756  ?BP: (!) 162/54 (!) 158/93  ?Pulse: (!) 107 90  ?Resp: (!) 25 20  ?Temp: (!) 36.3 ?C 36.8 ?C  ?SpO2: 97% 93%  ?  ?Last Pain:  ?Vitals:  ? 08/06/21 1756  ?TempSrc: Oral  ?PainSc: 5   ? ? ?  ?  ?  ?  ?  ?  ? ?Nicholas Trompeter P Kevion Fatheree ? ? ? ? ?

## 2021-08-06 NOTE — Transfer of Care (Signed)
Immediate Anesthesia Transfer of Care Note ? ?Patient: [name] ? ?Procedure(s) Performed: INTRAMEDULLARY (IM) NAIL INTERTROCHANTRIC (Left) ? ?Patient Location: PACU ? ?Anesthesia Type:General ? ?Level of Consciousness: awake, alert  and oriented ? ?Airway & Oxygen Therapy: Patient Spontanous Breathing and Patient connected to nasal cannula oxygen ? ?Post-op Assessment: Report given to RN and Post -op Vital signs reviewed and stable ? ?Post vital signs: Reviewed and stable ? ?Last Vitals:  ?Vitals Value Taken Time  ?BP 161/55 08/06/21 1637  ?Temp 36.3 ?C 08/06/21 1637  ?Pulse 93 08/06/21 1646  ?Resp 19 08/06/21 1646  ?SpO2 87 % 08/06/21 1646  ?Vitals shown include unvalidated device data. ? ?Last Pain:  ?Vitals:  ? 08/06/21 1637  ?TempSrc:   ?PainSc: 0-No pain  ?   ? ?  ? ?Complications: No notable events documented. ?

## 2021-08-06 NOTE — Discharge Instructions (Addendum)
Discharge Instructions after Hip Surgery ? ?You can bear weight and ambulate as tolerated on both legs ?Use ice on the hip intermittently over the first 48 hours after surgery.  ?Pain medicine has been prescribed for you.  ?Use your medicine liberally over the first 48 hours, and then you can begin to taper your use. You may take Extra Strength Tylenol or Tylenol only in place of the pain pills. DO NOT take ANY nonsteroidal anti-inflammatory pain medications: Advil, Motrin, Ibuprofen, Aleve, Naproxen or Naprosyn.  ?Take aspirin or anticoagulant medication as prescribed for 2 weeks after surgery. Please notify if allergic or sensitivity to aspirin. ?You may remove your dressing after five days. You can replace with gauze and paper tape dressing.  ?You may shower 5 days after surgery. The incisions CANNOT get wet prior to 5 days. Simply allow the water to wash over the site and then pat dry. Do not rub the incisions. ? ? ? ?Please call [phone] during normal business hours or [phone] after hours for any problems. Including the following: ? ?- excessive redness of the incisions ?- drainage for more than 4 days ?- fever of more than 101.5 F ? ?*Please note that pain medications will not be refilled after hours or on weekends. ?

## 2021-08-06 NOTE — Anesthesia Pain Management Evaluation Note (Signed)
?  Anesthesia Pain Consult Note ? ?Patient: [name], 86 y.o., female ? ?Consult Requested by: Nita Sells, MD ? ?Reason for Consult: left hip pain ? ?Level of Consciousness: alert ? ?Pain: severe  ? ?Last Vitals:  ?Vitals:  ? 08/06/21 1211 08/06/21 1353  ?BP: (!) 144/68 (!) 179/62  ?Pulse: 93 98  ?Resp: 19 20  ?Temp: 37.2 ?C   ?SpO2: 98% 95%  ? ? ?Plan: Peripheral nerve block for pain control ? ?Risks of wet tap, epidural hematoma and spinal cord injury explained to:  ? ?Consent:Risks of procedure as well as the alternatives and risks of each were explained to the (patient/caregiver).  Consent for procedure obtained. ? ? ? ?Allergies  ?Allergen Reactions  ?? Codeine Nausea Only  ? ? ?Physical exam: ?PULM normal  ?CARDIO Heart regular rate and rhythm  ?OTHER   ? ?I have reviewed the patient's medications listed below. ?? [MAR Hold] amLODipine  10 mg Oral Daily  ?? [MAR Hold] aspirin EC  81 mg Oral Daily  ?? [MAR Hold] atorvastatin  20 mg Oral Daily  ?? chlorhexidine  60 mL Topical Once  ?? [MAR Hold] fluticasone  2 spray Each Nare Daily  ?? [MAR Hold] heparin  5,000 Units Subcutaneous Q8H  ?? [MAR Hold] insulin aspart  0-15 Units Subcutaneous TID WC  ?? [MAR Hold] lisinopril  40 mg Oral Daily  ?? [MAR Hold] metoprolol tartrate  12.5 mg Oral BID  ?? [MAR Hold] pantoprazole  40 mg Oral Daily  ?? povidone-iodine  2 application. Topical Once  ?? [MAR Hold] Vitamin D (Ergocalciferol)  50,000 Units Oral Q7 days  ? ?? ceFAZolin    ?? [START ON 08/07/2021]  ceFAZolin (ANCEF) IV    ?? lactated ringers    ?? tranexamic acid    ?? tranexamic acid    ? ?[MAR Hold] acetaminophen **OR** [MAR Hold] acetaminophen, [MAR Hold] albuterol, [MAR Hold] cyclobenzaprine, [MAR Hold]  morphine injection, [MAR Hold] ondansetron **OR** [MAR Hold] ondansetron (ZOFRAN) IV, [MAR Hold] oxyCODONE, [MAR Hold] traZODone ? ?Past Medical History:  ?Diagnosis Date  ?? Arthritis   ? OA  ?? CHF (congestive heart failure) ([place])   ?? DDD  (degenerative disc disease), lumbosacral   ?? Diabetes mellitus without complication ([place])   ?? Distal radius fracture   ? bilateral  ?? GERD (gastroesophageal reflux disease)   ?? Glaucoma   ?? HOH (hard of hearing)   ? left ear  ?? Hypertension   ?? PONV (postoperative nausea and vomiting)   ? ?Past Surgical History:  ?Procedure Laterality Date  ?? ABDOMINAL HYSTERECTOMY    ?? BREAST BIOPSY    ?? BREAST EXCISIONAL BIOPSY Left   ? benign  ?? BREAST SURGERY    ? left milk duct   ?? EYE SURGERY    ? cataract  ?? FRACTURE SURGERY Left   ? hip fracture  ?? HIP FRACTURE SURGERY Right   ?? OPEN REDUCTION INTERNAL FIXATION (ORIF) DISTAL RADIAL FRACTURE Bilateral 05/24/2015  ? Procedure: OPEN REDUCTION INTERNAL FIXATION (ORIF) BILATERAL DISTAL RADIAL FRACTURE VOLAR PLATE;  Surgeon: Daryll Brod, MD;  Location: [place];  Service: Orthopedics;  Laterality: Bilateral;  ? ? reports that she has never smoked. She has never used smokeless tobacco. She reports that she does not drink alcohol and does not use drugs.  ? ? ?Peja Allender P Leasia Swann ?08/06/2021 ? ? ? ? ?

## 2021-08-06 NOTE — Op Note (Signed)
Procedure(s): ?INTRAMEDULLARY (IM) NAIL INTERTROCHANTRIC Procedure Note ? ?[name] ?female ?86 y.o. ?08/06/2021 ? ?Preoperative diagnosis: Left hip intertrochanteric proximal femur fracture ? ?Postoperative diagnosis: Same ? ?Procedure(s) and Anesthesia Type: ?   * INTRAMEDULLARY (IM) NAIL INTERTROCHANTRIC - General ? ?Surgeon(s) and Role: ?   Tania Ade, MD - Primary ? ? ?Indications:  86 y.o. female s/p fall with left hip fracture. Indicated for surgery to promote early ambulation, pain control and prevent complications of bed rest. ?    ?Surgeon: Rhae Hammock  ? ?Assistants: Forensic psychologist was present and scrubbed throughout the procedure and was essential in positioning, retraction, exposure, and closure) ? ?Anesthesia: General endotracheal anesthesia ? ? ?Procedure Detail ? ?INTRAMEDULLARY (IM) NAIL INTERTROCHANTRIC ? ?Findings: ?Near anatomic alignment with Bi[employer] affixes long nail with 1 distal interlocking screw. ? ?Estimated Blood Loss:  200 mL ?        ?Drains: none ? ?Blood Given: none  ?        ?Specimens: none ?       ?Complications:  * No complications entered in OR log * ?        ?Disposition: PACU - hemodynamically stable. ?        ?Condition: stable ?   ?Procedure: ? ?The patient was identified in the preoperative holding area  ?where I personally marked the operative site after verifying site, side,  ?and procedure with the patient. She was taken back to the operating  ?room where general anesthesia was induced without complication. She was  ?placed on the fracture table with the left lower extremity in traction,  ?and opposite lower extremity in a flexed abducted position. The arms were well  ?padded. Fluoroscopic imaging was used to verify reduction with gentle traction  ?and internal rotation. The left hip was then prepped and draped in the ?standard sterile fashion. An approximately 3 cm incision was made proximal  ?to the palpable greater trochanter tip.  Dissection was carried down to  ?the tip and the short guidewire was placed under fluoroscopic imaging.  ?The proximal entry reamer was used to open the canal and the ball-tipped  ?guidewire was then placed and advanced down the femoral canal. The 9 mm nail was then  ?advanced without difficulty and the proximal jig was  ?then placed and a small 1.5 cm incision was made on the lateral thigh to  ?advance the lag screw guide against the lateral aspect of the femur.  ?The guide pin was advanced and its position was verified in AP and  ?lateral planes to be centered in the head.  The guidewire was over  ?reamed and the appropriate size lag screw was advanced. The  ?proximal set screw was then advanced, backed off a quarter turn to allow  ?sliding. AP and lateral imaging demonstrated appropriate position of  ?the screw and reduction of the fracture. The proximal jig was then  ?removed. Attention was turned to the knee. Where using perfect  ?circles technique on the lateral x-ray, one distal interlocking screw  ?was placed from lateral to medial through a percutaneous incision.  ?Final fluoroscopic imaging in AP and lateral planes at the hip and the  ?knee demonstrated near anatomic reduction with appropriate length and  ?position of the hardware. All wounds were then copiously irrigated with  ?normal saline and subsequently closed in layers with #1 Vicryl in a deep  ?fascia layer, 2-0 Vicryl in a deep dermal layer, and staples for skin  ?closure. Sterile  dressings were then applied including 4x4s and Mepilex  ?dressings. The patient was then taken off the fracture table,  ?transferred to the stretcher, and taken to the recovery room in stable  ?condition after she was extubated.  ? ?POSTOPERATIVE PLAN: She will be weightbearing as tolerated on the operative ?extremity. She will have DVT prophylaxis of aspirin ?

## 2021-08-06 NOTE — Progress Notes (Signed)
?PROGRESS NOTE ? ? ?[name]  WNI:[id number] DOB: 1935/10/31 DOA: 08/05/2021 ?PCP: Janora Norlander, DO  ?Brief Narrative:  ?86 year old white female lives at home ?DM TY 2 on Janumet, HLD, reflux, glaucoma, osteoporosis.  Last DEXA 2019 ?Prior falls with ORIF radial fracture left hip fracture previously ?Experienced mechanical fall on bringing in the trash can tripped fell landed on left side 10/10 pain ? ?ED work-up revealed leukocytosis 14.5 without signs symptoms of infection hemoglobin 10 heart rate 101 chronic RBBB CXR normal ? ?Hospital-Problem based course ? ?Left hip fracture ?Operative management under Dr. Tamera Punt today-pain control, weightbearing status, anticoagulation, outpatient follow-up etc. per Ortho ?ACS risk~ 7.5 %--likely will need SNF ?DM TY 2 on orals ?Hold Janumet at this time sliding scale coverage-titrate as needed in the outpatient setting ?CBG ranging 1 90-2 40 ?Likely reactive leukocytosis ?Repeat labs in a.m.-no concerns from my perspective for infection ?Uncontrolled hypertension, some tachycardia-component of pain from hip fracture ?Continue lisinopril 40, amlodipine 10 ?Added today metoprolol 12.5 twice daily ?Reflux ?Osteoporosis ?Given she has a history of DEXA scan being positive for osteoporosis and she sustained fracture after coincidental fall, we will dose her with vitamin D 50,000 weekly and then check vitamin D in am ? ? ? ?DVT prophylaxis: Lovenox ?Code Status: DNR ?Family Communication: Discussed with sister at [place] ?Disposition:  ?IP ?  ?Consultants:  ?Orthopedics ? ?Procedures: None yet ? ?Antimicrobials: None ? ? ?Subjective: ?In some pain-states 6/10 ?Just medicated ?Many questions about the surgery which will be answered by orthopedics ?She has no chest pain fever chills cough cold abdominal pain ?She lives alone at home sister checks in on her frequently ? ?Objective: ?Vitals:  ? 08/05/21 2000 08/06/21 0030 08/06/21 0100 08/06/21 0330  ?BP: (!)  158/58 139/64 (!) 142/60 (!) 143/74  ?Pulse: (!) 105 100 (!) 107 80  ?Resp: (!) '28 13 15 '$ (!) 25  ?Temp:    98.4 ?F (36.9 ?C)  ?TempSrc:    Oral  ?SpO2: 100% 96% 97% 99%  ?Weight:      ?Height:      ? ?No intake or output data in the 24 hours ending 08/06/21 0720 ?Filed Weights  ? 08/05/21 1705  ?Weight: 53.1 kg  ? ? ?Examination: ? ?Alert coherent looks about stated age no icterus no pallor neck soft supple ?S1-S2 slightly tachycardic no murmur on monitors sinus tach ?Abdomen soft no rebound no guarding ?Left lower extremity externally rotated ?Sensory intact likely reactive ? ?Data Reviewed: personally reviewed  ? ?CBC ?   ?Component Value Date/Time  ? WBC 14.5 (H) 08/05/2021 1817  ? RBC 4.11 08/05/2021 1817  ? HGB 10.4 (L) 08/05/2021 1817  ? HGB 12.8 08/23/2020 1439  ? HCT 34.6 (L) 08/05/2021 1817  ? HCT 41.9 08/23/2020 1439  ? PLT 331 08/05/2021 1817  ? PLT 439 08/23/2020 1439  ? MCV 84.2 08/05/2021 1817  ? MCV 84 08/23/2020 1439  ? MCH 25.3 (L) 08/05/2021 1817  ? MCHC 30.1 08/05/2021 1817  ? RDW 14.9 08/05/2021 1817  ? RDW 14.3 08/23/2020 1439  ? LYMPHSABS 1.1 08/05/2021 1817  ? LYMPHSABS 1.0 06/02/2020 1109  ? MONOABS 1.0 08/05/2021 1817  ? EOSABS 0.1 08/05/2021 1817  ? EOSABS 0.2 06/02/2020 1109  ? BASOSABS 0.1 08/05/2021 1817  ? BASOSABS 0.1 06/02/2020 1109  ? ? ?  Latest Ref Rng & Units 08/05/2021  ?  6:17 PM 08/03/2021  ?  8:27 AM 09/20/2020  ? 11:27 AM  ?CMP  ?Glucose 70 -  99 mg/dL 239   149   63    ?BUN 8 - 23 mg/dL '22   20   23    '$ ?Creatinine 0.44 - 1.00 mg/dL 0.90   0.98   0.79    ?Sodium 135 - 145 mmol/L 138   137   138    ?Potassium 3.5 - 5.1 mmol/L 3.5   4.6   4.2    ?Chloride 98 - 111 mmol/L 98   94   96    ?CO2 22 - 32 mmol/L '29   27   31    '$ ?Calcium 8.9 - 10.3 mg/dL 8.4   9.5   9.1    ?Total Protein 6.0 - 8.5 g/dL  7.0     ?Total Bilirubin 0.0 - 1.2 mg/dL  0.4     ?Alkaline Phos 44 - 121 IU/L  80     ?AST 0 - 40 IU/L  19     ?ALT 0 - 32 IU/L  13     ? ? ? ?Radiology Studies: ?DG Chest 2  View ? ?Result Date: 08/05/2021 ?CLINICAL DATA:  Mechanical fall.  Left hip acute fracture. EXAM: CHEST - 2 VIEW COMPARISON:  Chest two views 09/20/2020; CT chest 08/13/2020 FINDINGS: Moderately decreased lung volumes. Mild-to-moderate dextrocurvature of the mid to lower thoracic spine. Mild elevation of the left hemidiaphragm. The cardiac silhouette is not well evaluated due to the overlying hemidiaphragm. Mild calcification within aortic arch. Moderate diffuse chronic interstitial thickening, similar to prior. Old healed posterior left approximate sixth and seventh rib fractures. There are multiple thoracic and upper lumbar vertebral body compression fractures appearing unchanged from prior. This includes moderate anterior T11 and severe anterior T12 and L1 compression fractures. Markedly severe T7 compression is unchanged from 08/13/2020 CT chest. IMPRESSION: Limited study by low lung volumes and patient positioning. No definite acute lung process. Electronically Signed   By: Yvonne Kendall M.D.   On: 08/05/2021 18:25  ? ?DG Shoulder Left ? ?Result Date: 08/05/2021 ?CLINICAL DATA:  Fall.  Pain. EXAM: LEFT SHOULDER - 2+ VIEW; LEFT HUMERUS - 2+ VIEW COMPARISON:  Chest two views 09/20/2020, 08/13/2020 FINDINGS: Left shoulder: There is mild acromioclavicular joint space narrowing with moderate peripheral degenerative osteophytosis. The glenohumeral joint is appropriately aligned. There is prominence of the lateral greater tuberosity with convexity of the bone margins which appears unchanged from prior shoulder radiographs and chronic. Within the limitations of diffuse decreased bone mineralization, no definite proximal humeral fracture is seen. Left humerus: Within limitations of diffuse decreased bone mineralization, no acute fracture is seen. There is mild-to-moderate spurring at the medial aspect of the trochlea. Partial visualization of distal radial plate and screw hardware. Old healed posterior left approximate  6th or seventh rib fracture. IMPRESSION: There is chronic lateral prominence of the proximal humeral head, likely the sequela of remote trauma. No definite acute fracture is seen within the limitation of diffuse decreased bone mineralization. Electronically Signed   By: Yvonne Kendall M.D.   On: 08/05/2021 18:19  ? ?DG Humerus Left ? ?Result Date: 08/05/2021 ?CLINICAL DATA:  Fall.  Pain. EXAM: LEFT SHOULDER - 2+ VIEW; LEFT HUMERUS - 2+ VIEW COMPARISON:  Chest two views 09/20/2020, 08/13/2020 FINDINGS: Left shoulder: There is mild acromioclavicular joint space narrowing with moderate peripheral degenerative osteophytosis. The glenohumeral joint is appropriately aligned. There is prominence of the lateral greater tuberosity with convexity of the bone margins which appears unchanged from prior shoulder radiographs and chronic. Within the limitations of diffuse decreased  bone mineralization, no definite proximal humeral fracture is seen. Left humerus: Within limitations of diffuse decreased bone mineralization, no acute fracture is seen. There is mild-to-moderate spurring at the medial aspect of the trochlea. Partial visualization of distal radial plate and screw hardware. Old healed posterior left approximate 6th or seventh rib fracture. IMPRESSION: There is chronic lateral prominence of the proximal humeral head, likely the sequela of remote trauma. No definite acute fracture is seen within the limitation of diffuse decreased bone mineralization. Electronically Signed   By: Yvonne Kendall M.D.   On: 08/05/2021 18:19  ? ?DG Hip Unilat With Pelvis 2-3 Views Left ? ?Result Date: 08/05/2021 ?CLINICAL DATA:  Fall.  Pain. EXAM: DG HIP (WITH OR WITHOUT PELVIS) 2-3V LEFT COMPARISON:  None available FINDINGS: There is diffuse decreased bone mineralization. There is cephalomedullary nail fixation of the proximal right femur with the distal aspect incompletely imaged. There is a markedly comminuted and moderately displaced left  intertrochanteric fracture with medial displacement of the lesser trochanter and moderate varus angulation. Linear lucency and mildly displaced superior cortical fragments at the superior left pubic ramus, likely an acute no

## 2021-08-06 NOTE — H&P (View-Only) (Signed)
Reason for Consult:left hip fracture ?Referring Physician: ED ? ?[name] is an 86 y.o. female.  ?HPI: [name] is a 86 y.o. female with medical history significant of with history of CHF, degenerative disc disease, diabetes mellitus type 2, hypertension, hyperlipidemia, postoperative nausea and vomiting. She presented to the ED yesterday with a chief complaint of a mechanical fall.  Patient reports that she was bringing in her trash can and then she tripped and fell and landed on her left side.  ? ?Past Medical History:  ?Diagnosis Date  ? Arthritis   ? OA  ? CHF (congestive heart failure) ([place])   ? DDD (degenerative disc disease), lumbosacral   ? Diabetes mellitus without complication ([place])   ? Distal radius fracture   ? bilateral  ? GERD (gastroesophageal reflux disease)   ? Glaucoma   ? HOH (hard of hearing)   ? left ear  ? Hypertension   ? PONV (postoperative nausea and vomiting)   ? ? ?Past Surgical History:  ?Procedure Laterality Date  ? ABDOMINAL HYSTERECTOMY    ? BREAST BIOPSY    ? BREAST EXCISIONAL BIOPSY Left   ? benign  ? BREAST SURGERY    ? left milk duct   ? EYE SURGERY    ? cataract  ? FRACTURE SURGERY Left   ? hip fracture  ? OPEN REDUCTION INTERNAL FIXATION (ORIF) DISTAL RADIAL FRACTURE Bilateral 05/24/2015  ? Procedure: OPEN REDUCTION INTERNAL FIXATION (ORIF) BILATERAL DISTAL RADIAL FRACTURE VOLAR PLATE;  Surgeon: Daryll Brod, MD;  Location: [facility];  Service: Orthopedics;  Laterality: Bilateral;  ? ? ?Family History  ?Problem Relation Age of Onset  ? Heart disease Mother   ? Heart disease Father   ? ? ?Social History:  reports that she has never smoked. She has never used smokeless tobacco. She reports that she does not drink alcohol and does not use drugs. ? ?Allergies:  ?Allergies  ?Allergen Reactions  ? Codeine Nausea Only  ? ? ?Medications: I have reviewed the patient's current medications. ? ?Results for orders placed or performed during the hospital encounter of  08/05/21 (from the past 48 hour(s))  ?Basic metabolic panel     Status: Abnormal  ? Collection Time: 08/05/21  6:17 PM  ?Result Value Ref Range  ? Sodium 138 135 - 145 mmol/L  ? Potassium 3.5 3.5 - 5.1 mmol/L  ? Chloride 98 98 - 111 mmol/L  ? CO2 29 22 - 32 mmol/L  ? Glucose, Bld 239 (H) 70 - 99 mg/dL  ?  Comment: Glucose reference range applies only to samples taken after fasting for at least 8 hours.  ? BUN 22 8 - 23 mg/dL  ? Creatinine, Ser 0.90 0.44 - 1.00 mg/dL  ? Calcium 8.4 (L) 8.9 - 10.3 mg/dL  ? GFR, Estimated >60 >60 mL/min  ?  Comment: (NOTE) ?Calculated using the CKD-EPI Creatinine Equation (2021) ?  ? Anion gap 11 5 - 15  ?  Comment: Performed at [facility], [address], [place], [place] [address]  ?CBC WITH DIFFERENTIAL     Status: Abnormal  ? Collection Time: 08/05/21  6:17 PM  ?Result Value Ref Range  ? WBC 14.5 (H) 4.0 - 10.5 K/uL  ? RBC 4.11 3.87 - 5.11 MIL/uL  ? Hemoglobin 10.4 (L) 12.0 - 15.0 g/dL  ? HCT 34.6 (L) 36.0 - 46.0 %  ? MCV 84.2 80.0 - 100.0 fL  ? MCH 25.3 (L) 26.0 - 34.0 pg  ?  MCHC 30.1 30.0 - 36.0 g/dL  ? RDW 14.9 11.5 - 15.5 %  ? Platelets 331 150 - 400 K/uL  ? nRBC 0.0 0.0 - 0.2 %  ? Neutrophils Relative % 83 %  ? Neutro Abs 12.1 (H) 1.7 - 7.7 K/uL  ? Lymphocytes Relative 7 %  ? Lymphs Abs 1.1 0.7 - 4.0 K/uL  ? Monocytes Relative 7 %  ? Monocytes Absolute 1.0 0.1 - 1.0 K/uL  ? Eosinophils Relative 1 %  ? Eosinophils Absolute 0.1 0.0 - 0.5 K/uL  ? Basophils Relative 1 %  ? Basophils Absolute 0.1 0.0 - 0.1 K/uL  ? Immature Granulocytes 1 %  ? Abs Immature Granulocytes 0.18 (H) 0.00 - 0.07 K/uL  ?  Comment: Performed at [facility], [address], [place], [place] [address]  ?Protime-INR     Status: None  ? Collection Time: 08/05/21  6:17 PM  ?Result Value Ref Range  ? Prothrombin Time 13.0 11.4 - 15.2 seconds  ? INR 1.0 0.8 - 1.2  ?  Comment: (NOTE) ?INR goal varies based on device and disease states. ?Performed at [facility], [address], [place], [place] [address] ?  ?Type  and screen [facility]     Status: None  ? Collection Time: 08/05/21  6:17 PM  ?Result Value Ref Range  ? ABO/RH(D) A POS   ? Antibody Screen NEG   ? Sample Expiration    ?  08/08/2021,2359 ?Performed at [facility], [address], [place], [place] [address] ?  ?CBG monitoring, ED     Status: Abnormal  ? Collection Time: 08/06/21  7:47 AM  ?Result Value Ref Range  ? Glucose-Capillary 190 (H) 70 - 99 mg/dL  ?  Comment: Glucose reference range applies only to samples taken after fasting for at least 8 hours.  ? ? ?DG Chest 2 View ? ?Result Date: 08/05/2021 ?CLINICAL DATA:  Mechanical fall.  Left hip acute fracture. EXAM: CHEST - 2 VIEW COMPARISON:  Chest two views 09/20/2020; CT chest 08/13/2020 FINDINGS: Moderately decreased lung volumes. Mild-to-moderate dextrocurvature of the mid to lower thoracic spine. Mild elevation of the left hemidiaphragm. The cardiac silhouette is not well evaluated due to the overlying hemidiaphragm. Mild calcification within aortic arch. Moderate diffuse chronic interstitial thickening, similar to prior. Old healed posterior left approximate sixth and seventh rib fractures. There are multiple thoracic and upper lumbar vertebral body compression fractures appearing unchanged from prior. This includes moderate anterior T11 and severe anterior T12 and L1 compression fractures. Markedly severe T7 compression is unchanged from 08/13/2020 CT chest. IMPRESSION: Limited study by low lung volumes and patient positioning. No definite acute lung process. Electronically Signed   By: Yvonne Kendall M.D.   On: 08/05/2021 18:25  ? ?DG Shoulder Left ? ?Result Date: 08/05/2021 ?CLINICAL DATA:  Fall.  Pain. EXAM: LEFT SHOULDER - 2+ VIEW; LEFT HUMERUS - 2+ VIEW COMPARISON:  Chest two views 09/20/2020, 08/13/2020 FINDINGS: Left shoulder: There is mild acromioclavicular joint space narrowing with moderate peripheral degenerative osteophytosis. The glenohumeral joint is appropriately aligned. There is  prominence of the lateral greater tuberosity with convexity of the bone margins which appears unchanged from prior shoulder radiographs and chronic. Within the limitations of diffuse decreased bone mineralization, no definite proximal humeral fracture is seen. Left humerus: Within limitations of diffuse decreased bone mineralization, no acute fracture is seen. There is mild-to-moderate spurring at the medial aspect of the trochlea. Partial visualization of distal radial plate and screw hardware. Old healed  posterior left approximate 6th or seventh rib fracture. IMPRESSION: There is chronic lateral prominence of the proximal humeral head, likely the sequela of remote trauma. No definite acute fracture is seen within the limitation of diffuse decreased bone mineralization. Electronically Signed   By: Yvonne Kendall M.D.   On: 08/05/2021 18:19  ? ?DG Humerus Left ? ?Result Date: 08/05/2021 ?CLINICAL DATA:  Fall.  Pain. EXAM: LEFT SHOULDER - 2+ VIEW; LEFT HUMERUS - 2+ VIEW COMPARISON:  Chest two views 09/20/2020, 08/13/2020 FINDINGS: Left shoulder: There is mild acromioclavicular joint space narrowing with moderate peripheral degenerative osteophytosis. The glenohumeral joint is appropriately aligned. There is prominence of the lateral greater tuberosity with convexity of the bone margins which appears unchanged from prior shoulder radiographs and chronic. Within the limitations of diffuse decreased bone mineralization, no definite proximal humeral fracture is seen. Left humerus: Within limitations of diffuse decreased bone mineralization, no acute fracture is seen. There is mild-to-moderate spurring at the medial aspect of the trochlea. Partial visualization of distal radial plate and screw hardware. Old healed posterior left approximate 6th or seventh rib fracture. IMPRESSION: There is chronic lateral prominence of the proximal humeral head, likely the sequela of remote trauma. No definite acute fracture is seen within  the limitation of diffuse decreased bone mineralization. Electronically Signed   By: Yvonne Kendall M.D.   On: 08/05/2021 18:19  ? ?DG Hip Unilat With Pelvis 2-3 Views Left ? ?Result Date: 08/05/2021 ?[facility]

## 2021-08-06 NOTE — ED Notes (Signed)
Received patient in no acute distress , patient is breathing on 2L of 02 saturating at 99%. Patient denies complaints at this time. Denies pain . Pending surgery.  ?

## 2021-08-06 NOTE — ED Notes (Signed)
Pt transported to PACU for nerve block. 

## 2021-08-07 LAB — COMPREHENSIVE METABOLIC PANEL
ALT: 15 U/L (ref 0–44)
AST: 20 U/L (ref 15–41)
Albumin: 2.9 g/dL — ABNORMAL LOW (ref 3.5–5.0)
Alkaline Phosphatase: 48 U/L (ref 38–126)
Anion gap: 12 (ref 5–15)
BUN: 18 mg/dL (ref 8–23)
CO2: 29 mmol/L (ref 22–32)
Calcium: 8.4 mg/dL — ABNORMAL LOW (ref 8.9–10.3)
Chloride: 96 mmol/L — ABNORMAL LOW (ref 98–111)
Creatinine, Ser: 0.95 mg/dL (ref 0.44–1.00)
GFR, Estimated: 59 mL/min — ABNORMAL LOW (ref >60)
Glucose, Bld: 333 mg/dL — ABNORMAL HIGH (ref 70–99)
Potassium: 4.4 mmol/L (ref 3.5–5.1)
Sodium: 137 mmol/L (ref 135–145)
Total Bilirubin: 0.6 mg/dL (ref 0.3–1.2)
Total Protein: 6.1 g/dL — ABNORMAL LOW (ref 6.5–8.1)

## 2021-08-07 LAB — CBC WITH DIFFERENTIAL/PLATELET
Abs Immature Granulocytes: 0.06 10*3/uL (ref 0.00–0.07)
Basophils Absolute: 0 10*3/uL (ref 0.0–0.1)
Basophils Relative: 0 %
Eosinophils Absolute: 0 10*3/uL (ref 0.0–0.5)
Eosinophils Relative: 0 %
HCT: 25.6 % — ABNORMAL LOW (ref 36.0–46.0)
Hemoglobin: 8.1 g/dL — ABNORMAL LOW (ref 12.0–15.0)
Immature Granulocytes: 1 %
Lymphocytes Relative: 7 %
Lymphs Abs: 0.8 10*3/uL (ref 0.7–4.0)
MCH: 26.6 pg (ref 26.0–34.0)
MCHC: 31.6 g/dL (ref 30.0–36.0)
MCV: 83.9 fL (ref 80.0–100.0)
Monocytes Absolute: 1.1 10*3/uL — ABNORMAL HIGH (ref 0.1–1.0)
Monocytes Relative: 9 %
Neutro Abs: 10 10*3/uL — ABNORMAL HIGH (ref 1.7–7.7)
Neutrophils Relative %: 83 %
Platelets: 255 10*3/uL (ref 150–400)
RBC: 3.05 MIL/uL — ABNORMAL LOW (ref 3.87–5.11)
RDW: 15.3 % (ref 11.5–15.5)
WBC: 11.9 10*3/uL — ABNORMAL HIGH (ref 4.0–10.5)
nRBC: 0.3 % — ABNORMAL HIGH (ref 0.0–0.2)

## 2021-08-07 LAB — GLUCOSE, CAPILLARY
Glucose-Capillary: 298 mg/dL — ABNORMAL HIGH (ref 70–99)
Glucose-Capillary: 326 mg/dL — ABNORMAL HIGH (ref 70–99)
Glucose-Capillary: 184 mg/dL — ABNORMAL HIGH (ref 70–99)
Glucose-Capillary: 155 mg/dL — ABNORMAL HIGH (ref 70–99)
Glucose-Capillary: 204 mg/dL — ABNORMAL HIGH (ref 70–99)

## 2021-08-07 LAB — VITAMIN D 25 HYDROXY (VIT D DEFICIENCY, FRACTURES): Vit D, 25-Hydroxy: 35.63 ng/mL (ref 30–100)

## 2021-08-07 MED ORDER — LISINOPRIL 10 MG PO TABS
10.0000 mg | ORAL_TABLET | Freq: Every day | ORAL | Status: DC
  Administered 2021-08-08 – 2021-08-09 (×2): 10 mg via ORAL
  Filled 2021-08-07 (×2): qty 1

## 2021-08-07 MED ORDER — SITAGLIPTIN PHOS-METFORMIN HCL 50-1000 MG PO TABS: 1.0000 | ORAL_TABLET | Freq: Two times a day (BID) | ORAL | Status: DC

## 2021-08-07 MED ORDER — HYDROCHLOROTHIAZIDE 12.5 MG PO TABS
12.5000 mg | ORAL_TABLET | Freq: Every day | ORAL | Status: DC
  Administered 2021-08-07 – 2021-08-09 (×3): 12.5 mg via ORAL
  Filled 2021-08-07 (×3): qty 1

## 2021-08-07 MED ORDER — METFORMIN HCL 500 MG PO TABS
1000.0000 mg | ORAL_TABLET | Freq: Two times a day (BID) | ORAL | Status: DC
  Administered 2021-08-07 – 2021-08-10 (×6): 1000 mg via ORAL
  Filled 2021-08-07 (×6): qty 2

## 2021-08-07 MED ORDER — LINAGLIPTIN 5 MG PO TABS
5.0000 mg | ORAL_TABLET | Freq: Every day | ORAL | Status: DC
Start: 2021-08-07 — End: 2021-08-10
  Administered 2021-08-07 – 2021-08-10 (×4): 5 mg via ORAL
  Filled 2021-08-07 (×4): qty 1

## 2021-08-07 NOTE — Evaluation (Signed)
Physical Therapy Evaluation Patient Details Name: [name] MRN: [id number] DOB: 12-05-1935 Today's Date: 08/07/2021  History of Present Illness  The pt is an 86 yo female presenting 3/31 with L hip fx after a fall. She is now s/p IM nailing of L hip on 4/1. PMH includes: CHF, DDD< DM II, GERD, HOH, HTN, and previous fall with ORIF of L hip.   Clinical Impression  Pt in bed upon arrival of PT, agreeable to evaluation at this time. Prior to admission the pt was mobilizing with use of a cane in her home, living alone but has aide for 2 hours/day to assist with ADLs and IADLs. The pt reports no other recent falls and is hopeful for return home. However due to both pain, weakness, and fear of falling, the pt required modA to complete bed mobility, and totalA of 1-2 to complete pivot transfers to [facility] and recliner. She was able to stand with modA and RW from elevated BSC, but unable to generate any steps despite maxA to maintain balance. The pt continued to express desire to return home, but would require significant increase in assist available (+2 for out of bed transfers or gait), and therefore am recommending SNF rehab prior to return home.   BP: 102/47 (64) while sitting on BSC BP: 123/49 (61) in recliner at end of session SpO2 93% on RA        Recommendations for follow up therapy are one component of a multi-disciplinary discharge planning process, led by the attending physician.  Recommendations may be updated based on patient status, additional functional criteria and insurance authorization.  Follow Up Recommendations Skilled nursing-short term rehab (<3 hours/day)    Assistance Recommended at Discharge Frequent or constant Supervision/Assistance  Patient can return home with the following  Two people to help with walking and/or transfers;Two people to help with bathing/dressing/bathroom;Direct supervision/assist for medications management;Assist for transportation;Help with stairs or  ramp for entrance    Equipment Recommendations Rolling walker (2 wheels);Wheelchair (measurements PT);Wheelchair cushion (measurements PT)  Recommendations for Other Services  OT consult    Functional Status Assessment Patient has had a recent decline in their functional status and demonstrates the ability to make significant improvements in function in a reasonable and predictable amount of time.     Precautions / Restrictions Precautions Precautions: Fall Precaution Comments: admitted for fall Restrictions Weight Bearing Restrictions: Yes LLE Weight Bearing: Weight bearing as tolerated      Mobility  Bed Mobility Overal bed mobility: Needs Assistance Bed Mobility: Supine to Sit     Supine to sit: Max assist     General bed mobility comments: pt at times resistant due to fear or pain, maxA to each leg to reach EOB. modA to pull up on trunk    Transfers Overall transfer level: Needs assistance Equipment used: 1 person hand held assist, Rolling walker (2 wheels) Transfers: Sit to/from Stand, Bed to chair/wheelchair/BSC Sit to Stand: Mod assist     Squat pivot transfers: Total assist     General transfer comment: totalA initially for pivot to BSC from EOB, then modA to stand from [facility] with RW. however, pt unable to generate many steps with RLE or LLE with attempt to pivot to chair from [facility], then needing totalA again to safely reach chair    Ambulation/Gait               General Gait Details: pt unable to safely attempt      Balance Overall balance assessment:  Needs assistance Sitting-balance support: Bilateral upper extremity supported, Feet supported Sitting balance-Leahy Scale: Poor     Standing balance support: Bilateral upper extremity supported, During functional activity, Reliant on assistive device for balance Standing balance-Leahy Scale: Poor Standing balance comment: totalA to complete pivot, dependent on assist and BUE support to stand                              Pertinent Vitals/Pain Pain Assessment Pain Assessment: Faces Faces Pain Scale: Hurts even more Pain Location: L hip with mobility. Pain Descriptors / Indicators: Discomfort, Grimacing, Moaning Pain Intervention(s): Limited activity within patient's tolerance, Monitored during session, Repositioned, Ice applied    Home Living Family/patient expects to be discharged to:: Private residence Living Arrangements: Alone Available Help at Discharge: Personal care attendant (2 hours a day) Type of Home: House Home Access: Stairs to enter Entrance Stairs-Rails: None Entrance Stairs-Number of Steps: 2   Home Layout: One level Home Equipment: Rollator (4 wheels);Cane - single point;BSC/3in1      Prior Function Prior Level of Function : Needs assist       Physical Assist : Mobility (physical);ADLs (physical) Mobility (physical): Transfers;Gait ADLs (physical): Bathing;Dressing;IADLs Mobility Comments: pt uses cane for mobility in the house ADLs Comments: pt reports aide assist 2 days/week with shower, other days with wash up. helps the pt dress. reports independence with toileting     Hand Dominance   Dominant Hand: Right    Extremity/Trunk Assessment   Upper Extremity Assessment Upper Extremity Assessment: Generalized weakness    Lower Extremity Assessment Lower Extremity Assessment: Generalized weakness;LLE deficits/detail LLE Deficits / Details: limited by pain and weakness. LLE: Unable to fully assess due to pain LLE Sensation: WNL    Cervical / Trunk Assessment Cervical / Trunk Assessment: Kyphotic  Communication   Communication: No difficulties  Cognition Arousal/Alertness: Awake/alert Behavior During Therapy: WFL for tasks assessed/performed, Anxious Overall Cognitive Status: Impaired/Different from baseline Area of Impairment: Safety/judgement                         Safety/Judgement: Decreased awareness of deficits,  Decreased awareness of safety     General Comments: poor insight to need for assist, following commands well despite fear and anxiety        General Comments General comments (skin integrity, edema, etc.): SpO2 stable 89-98% on RA    Exercises     Assessment/Plan    PT Assessment Patient needs continued PT services  PT Problem List Decreased strength;Decreased range of motion;Decreased activity tolerance;Decreased balance;Decreased mobility;Decreased safety awareness       PT Treatment Interventions DME instruction;Gait training;Stair training;Functional mobility training;Therapeutic activities;Therapeutic exercise;Balance training;Patient/family education    PT Goals (Current goals can be found in the Care Plan section)  Acute Rehab PT Goals Patient Stated Goal: return home PT Goal Formulation: With patient Time For Goal Achievement: 08/21/21 Potential to Achieve Goals: Fair    Frequency Min 3X/week        AM-PAC PT "6 Clicks" Mobility  Outcome Measure Help needed turning from your back to your side while in a flat bed without using bedrails?: A Lot Help needed moving from lying on your back to sitting on the side of a flat bed without using bedrails?: A Lot Help needed moving to and from a bed to a chair (including a wheelchair)?: Total Help needed standing up from a chair using your arms (e.g., wheelchair or bedside  chair)?: Total Help needed to walk in hospital room?: Total Help needed climbing 3-5 steps with a railing? : Total 6 Click Score: 8    End of Session Equipment Utilized During Treatment: Gait belt Activity Tolerance: Patient tolerated treatment well Patient left: in chair;with call bell/phone within reach;with nursing/sitter in room;with family/visitor present Nurse Communication: Mobility status PT Visit Diagnosis: Unsteadiness on feet (R26.81);Other abnormalities of gait and mobility (R26.89);History of falling (Z91.81);Pain Pain - Right/Left:  Left Pain - part of body: Hip;Leg    Time: 8295-6213 PT Time Calculation (min) (ACUTE ONLY): 50 min   Charges:   PT Evaluation $PT Eval Low Complexity: 1 Low PT Treatments $Therapeutic Exercise: 8-22 mins $Therapeutic Activity: 8-22 mins        Vickki Muff, PT, DPT   Acute Rehabilitation Department Pager #: [phone]  Ronnie Derby 08/07/2021, 3:37 PM

## 2021-08-07 NOTE — Progress Notes (Signed)
PATIENT ID: [name]  MRN: [id number]  DOB/AGE:  10/12/1935 / 86 y.o.  ?1 Day Post-Op Procedure(s) (LRB): ?INTRAMEDULLARY (IM) NAIL INTERTROCHANTRIC (Left) ? ?Subjective: ?Pain is moderate in the left hip and leg. Patient having some spasms in the left leg. No c/o chest pain or SOB.    ? ?Objective: ?Vital signs in last 24 hours: ?Temp:  [97.4 ?F (36.3 ?C)-99.4 ?F (37.4 ?C)] 98.4 ?F (36.9 ?C) (04/02 4098) ?Pulse Rate:  [62-107] 90 (04/02 0806) ?Resp:  [12-25] 12 (04/02 0806) ?BP: (132-199)/(54-94) 132/58 (04/02 0806) ?SpO2:  [93 %-100 %] 100 % (04/02 0806) ?Weight:  [53.1 kg] 53.1 kg (04/01 1344) ? ?Intake/Output from previous day: ?04/01 0701 - 04/02 0700 ?In: 1200 [I.V.:800; IV Piggyback:400] ?Out: 100 [Blood:100] ?Intake/Output this shift: ?No intake/output data recorded. ? ?Recent Labs  ?  08/05/21 ?1817 08/07/21 ?1191  ?HGB 10.4* 8.1*  ? ?Recent Labs  ?  08/05/21 ?1817 08/07/21 ?4782  ?WBC 14.5* 11.9*  ?RBC 4.11 3.05*  ?HCT 34.6* 25.6*  ?PLT 331 255  ? ?Recent Labs  ?  08/05/21 ?1817 08/07/21 ?9562  ?NA 138 137  ?K 3.5 4.4  ?CL 98 96*  ?CO2 29 29  ?BUN 22 18  ?CREATININE 0.90 0.95  ?GLUCOSE 239* 333*  ?CALCIUM 8.4* 8.4*  ? ?Recent Labs  ?  08/05/21 ?1817  ?INR 1.0  ? ? ?Physical Exam: ?Neurologically intact ?Neurovascular intact ?Sensation intact distally ?Intact pulses distally ?Dorsiflexion/Plantar flexion intact ?Incision: dressing C/D/I ?No cellulitis present ?Compartment soft ? ?Assessment/Plan: ?1 Day Post-Op Procedure(s) (LRB): ?INTRAMEDULLARY (IM) NAIL INTERTROCHANTRIC (Left) ?  ?Advance diet ?Up with therapy ?Weight Bearing as Tolerated (WBAT) ?VTE prophylaxis:  aspirin '81mg'$  daily ?Patient will likely need discharge to SNF but will defer DC planning to medicine.  ? ? ?Jeny Nield L. Porterfield, PA-C ?08/07/2021, 9:04 AM  ? ?  ?

## 2021-08-07 NOTE — Progress Notes (Signed)
PT Cancellation Note ? ?Patient Details ?Name: [name] ?MRN: [id number] ?DOB: November 13, 1935 ? ? ?Cancelled Treatment:    Reason Eval/Treat Not Completed: Pain limiting ability to participate;Other (comment) Upon my arrival, pt had just attempted to mobilize to chair with RN staff and reports she is in too much pain to attempt mobilizing again at this time. Plan to return later today for evaluation and pt is agreeable.  ? ?[place] Carbo, PT, DPT  ? ?Acute Rehabilitation Department ?Pager #: [phone] ? ? ?Sandra Cockayne ?08/07/2021, 10:28 AM ?

## 2021-08-07 NOTE — Progress Notes (Signed)
Pt complained of soreness to chest with mobility, she stated it had started ever since she was picked up from the floor by EMS and triggered by cramping and pain to her L leg when moving, VS taken and recorded, Dr. Verlon Au made aware, no new orders. Pt's family at bedside updated. Pt monitored, no further complaints at this time. Pt now resting comfortably in bed. Will endorse accordingly to oncoming nurse. ?

## 2021-08-07 NOTE — Progress Notes (Addendum)
?PROGRESS NOTE ? ? ?ALYNNA Underwood  KGU:[id number] DOB: 1936-01-17 DOA: 08/05/2021 ?PCP: Janora Norlander, DO  ?Brief Narrative:  ?86 year old white female lives at home ?DM TY 2 on Janumet, HLD, reflux, glaucoma, osteoporosis.  Last DEXA 2019 ?Prior falls with ORIF radial fracture left hip fracture previously ?Experienced mechanical fall on bringing in the trash can tripped fell landed on left side 10/10 pain ? ?ED work-up revealed leukocytosis 14.5 without signs symptoms of infection hemoglobin 10 heart rate 101 chronic RBBB CXR normal ? ?Hospital-Problem based course ? ?Left hip fracture ?Operative management under Dr. Tamera Punt today-pain control, weightbearing as tolerated ?anticoagulation = aspirin 81, outpatient follow-up etc. per Ortho ?ACS risk~ 7.5 %--likely will need SNF once therapy sees the patient and evaluate ?DM TY 2 on orals ?CBG 200s to low 300s-reinitiate Janumet 4/2, keep sliding scale ?Anemia of expected blood loss with hemodilution perioperatively ?Hemoglobin down 2 points from 10-8-monitor trend in a.m. transfusion trigger below 7 in her case ?Likely reactive leukocytosis ?Repeat labs show postoperative drop in white count ?No fever-repeat in a.m. expectant management no antibiotics needed ?Uncontrolled hypertension, some tachycardia-component of pain from hip fracture ?Continue lisinopril 40, amlodipine 10 ?Added metoprolol 12.5 twice daily-sinus tachycardia overall improved ?Reflux ?Osteoporosis ?Vitamin D level 35--given 1 dose of 50,000 units vitamin D 4/1 ?Outpatient osteoporosis management as per PCP- ? ? ?DVT prophylaxis: Lovenox ?Code Status: DNR ?Family Communication: No family at the bedside ?Disposition:  ?IP ?  ?Consultants:  ?Orthopedics ? ?Procedures: [name] yet ? ?Antimicrobials: [name] ? ? ?Subjective: ? ?Pain is moderate-some spasm relieved by meds ?Doing fair overall-does not desat off oxygen ?No stool ?Has not been out of bed yet ? ? ?Objective: ?Vitals:  ? 08/06/21 2352  08/07/21 0351 08/07/21 0806 08/07/21 1004  ?BP: (!) 136/54 139/83 (!) 132/58   ?Pulse: 62 72 90   ?Resp:   12   ?Temp: 98.7 ?F (37.1 ?C) 98.8 ?F (37.1 ?C) 98.4 ?F (36.9 ?C)   ?TempSrc: Oral Oral Oral   ?SpO2: 100% 100% 100% 97%  ?Weight:      ?Height:      ? ? ?Intake/Output Summary (Last 24 hours) at 08/07/2021 1015 ?Last data filed at 08/07/2021 [phone] ?Gross per 24 hour  ?Intake 1200 ml  ?Output 100 ml  ?Net 1100 ml  ? ?Filed Weights  ? 08/05/21 1705 08/06/21 1344  ?Weight: 53.1 kg 53.1 kg  ? ? ?Examination: ? ?Coherent no distress ?No chest pain no fever ?Chest clear no rales rhonchi ?Abdomen slightly distended nonobese no stool ?No lower extremity edema ?S1-S2 no murmur ? ?Data Reviewed: personally reviewed  ? ?CBC ?   ?Component Value Date/Time  ? WBC 11.9 (H) 08/07/2021 1517  ? RBC 3.05 (L) 08/07/2021 6160  ? HGB 8.1 (L) 08/07/2021 7371  ? HGB 12.8 08/23/2020 1439  ? HCT 25.6 (L) 08/07/2021 0626  ? HCT 41.9 08/23/2020 1439  ? PLT 255 08/07/2021 0213  ? PLT 439 08/23/2020 1439  ? MCV 83.9 08/07/2021 0213  ? MCV 84 08/23/2020 1439  ? MCH 26.6 08/07/2021 0213  ? MCHC 31.6 08/07/2021 0213  ? RDW 15.3 08/07/2021 0213  ? RDW 14.3 08/23/2020 1439  ? LYMPHSABS 0.8 08/07/2021 0213  ? LYMPHSABS 1.0 06/02/2020 1109  ? MONOABS 1.1 (H) 08/07/2021 9485  ? EOSABS 0.0 08/07/2021 0213  ? EOSABS 0.2 06/02/2020 1109  ? BASOSABS 0.0 08/07/2021 0213  ? BASOSABS 0.1 06/02/2020 1109  ? ? ?  Latest Ref Rng & Units 08/07/2021  ?  2:13 AM 08/05/2021  ?  6:17 PM 08/03/2021  ?  8:27 AM  ?CMP  ?Glucose 70 - 99 mg/dL 333   239   149    ?BUN 8 - 23 mg/dL '18   22   20    '$ ?Creatinine 0.44 - 1.00 mg/dL 0.95   0.90   0.98    ?Sodium 135 - 145 mmol/L 137   138   137    ?Potassium 3.5 - 5.1 mmol/L 4.4   3.5   4.6    ?Chloride 98 - 111 mmol/L 96   98   94    ?CO2 22 - 32 mmol/L '29   29   27    '$ ?Calcium 8.9 - 10.3 mg/dL 8.4   8.4   9.5    ?Total Protein 6.5 - 8.1 g/dL 6.1    7.0    ?Total Bilirubin 0.3 - 1.2 mg/dL 0.6    0.4    ?Alkaline Phos 38 - 126 U/L  48    80    ?AST 15 - 41 U/L 20    19    ?ALT 0 - 44 U/L 15    13    ? ? ? ?Radiology Studies: ?DG Chest 2 View ? ?Result Date: 08/05/2021 ?CLINICAL DATA:  Mechanical fall.  Left hip acute fracture. EXAM: CHEST - 2 VIEW COMPARISON:  Chest two views 09/20/2020; CT chest 08/13/2020 FINDINGS: Moderately decreased lung volumes. Mild-to-moderate dextrocurvature of the mid to lower thoracic spine. Mild elevation of the left hemidiaphragm. The cardiac silhouette is not well evaluated due to the overlying hemidiaphragm. Mild calcification within aortic arch. Moderate diffuse chronic interstitial thickening, similar to prior. Old healed posterior left approximate sixth and seventh rib fractures. There are multiple thoracic and upper lumbar vertebral body compression fractures appearing unchanged from prior. This includes moderate anterior T11 and severe anterior T12 and L1 compression fractures. Markedly severe T7 compression is unchanged from 08/13/2020 CT chest. IMPRESSION: Limited study by low lung volumes and patient positioning. No definite acute lung process. Electronically Signed   By: Yvonne Kendall M.D.   On: 08/05/2021 18:25  ? ?DG Shoulder Left ? ?Result Date: 08/05/2021 ?CLINICAL DATA:  Fall.  Pain. EXAM: LEFT SHOULDER - 2+ VIEW; LEFT HUMERUS - 2+ VIEW COMPARISON:  Chest two views 09/20/2020, 08/13/2020 FINDINGS: Left shoulder: There is mild acromioclavicular joint space narrowing with moderate peripheral degenerative osteophytosis. The glenohumeral joint is appropriately aligned. There is prominence of the lateral greater tuberosity with convexity of the bone margins which appears unchanged from prior shoulder radiographs and chronic. Within the limitations of diffuse decreased bone mineralization, no definite proximal humeral fracture is seen. Left humerus: Within limitations of diffuse decreased bone mineralization, no acute fracture is seen. There is mild-to-moderate spurring at the medial aspect of the  trochlea. Partial visualization of distal radial plate and screw hardware. Old healed posterior left approximate 6th or seventh rib fracture. IMPRESSION: There is chronic lateral prominence of the proximal humeral head, likely the sequela of remote trauma. No definite acute fracture is seen within the limitation of diffuse decreased bone mineralization. Electronically Signed   By: Yvonne Kendall M.D.   On: 08/05/2021 18:19  ? ?DG Humerus Left ? ?Result Date: 08/05/2021 ?CLINICAL DATA:  Fall.  Pain. EXAM: LEFT SHOULDER - 2+ VIEW; LEFT HUMERUS - 2+ VIEW COMPARISON:  Chest two views 09/20/2020, 08/13/2020 FINDINGS: Left shoulder: There is mild acromioclavicular joint space narrowing with moderate peripheral degenerative osteophytosis. The glenohumeral joint is appropriately  aligned. There is prominence of the lateral greater tuberosity with convexity of the bone margins which appears unchanged from prior shoulder radiographs and chronic. Within the limitations of diffuse decreased bone mineralization, no definite proximal humeral fracture is seen. Left humerus: Within limitations of diffuse decreased bone mineralization, no acute fracture is seen. There is mild-to-moderate spurring at the medial aspect of the trochlea. Partial visualization of distal radial plate and screw hardware. Old healed posterior left approximate 6th or seventh rib fracture. IMPRESSION: There is chronic lateral prominence of the proximal humeral head, likely the sequela of remote trauma. No definite acute fracture is seen within the limitation of diffuse decreased bone mineralization. Electronically Signed   By: Yvonne Kendall M.D.   On: 08/05/2021 18:19  ? ?DG C-Arm 1-60 Min-No Report ? ?Result Date: 08/06/2021 ?Fluoroscopy was utilized by the requesting physician.  No radiographic interpretation.  ? ?DG Hip Unilat With Pelvis 2-3 Views Left ? ?Result Date: 08/05/2021 ?CLINICAL DATA:  Fall.  Pain. EXAM: DG HIP (WITH OR WITHOUT PELVIS) 2-3V LEFT  COMPARISON:  [name] available FINDINGS: There is diffuse decreased bone mineralization. There is cephalomedullary nail fixation of the proximal right femur with the distal aspect incompletely imaged. There is a markedly com

## 2021-08-08 ENCOUNTER — Encounter (HOSPITAL_COMMUNITY): Payer: Self-pay | Admitting: Orthopedic Surgery

## 2021-08-08 LAB — CBC WITH DIFFERENTIAL/PLATELET
Abs Immature Granulocytes: 0.05 10*3/uL (ref 0.00–0.07)
Basophils Absolute: 0 10*3/uL (ref 0.0–0.1)
Basophils Relative: 0 %
Eosinophils Absolute: 0.3 10*3/uL (ref 0.0–0.5)
Eosinophils Relative: 3 %
HCT: 21.5 % — ABNORMAL LOW (ref 36.0–46.0)
Hemoglobin: 6.7 g/dL — CL (ref 12.0–15.0)
Immature Granulocytes: 1 %
Lymphocytes Relative: 14 %
Lymphs Abs: 1.6 10*3/uL (ref 0.7–4.0)
MCH: 26.2 pg (ref 26.0–34.0)
MCHC: 31.2 g/dL (ref 30.0–36.0)
MCV: 84 fL (ref 80.0–100.0)
Monocytes Absolute: 1.4 10*3/uL — ABNORMAL HIGH (ref 0.1–1.0)
Monocytes Relative: 12 %
Neutro Abs: 7.7 10*3/uL (ref 1.7–7.7)
Neutrophils Relative %: 70 %
Platelets: 210 10*3/uL (ref 150–400)
RBC: 2.56 MIL/uL — ABNORMAL LOW (ref 3.87–5.11)
RDW: 15.1 % (ref 11.5–15.5)
WBC: 11 10*3/uL — ABNORMAL HIGH (ref 4.0–10.5)
nRBC: 0 % (ref 0.0–0.2)

## 2021-08-08 LAB — PREPARE RBC (CROSSMATCH)

## 2021-08-08 LAB — GLUCOSE, CAPILLARY
Glucose-Capillary: 162 mg/dL — ABNORMAL HIGH (ref 70–99)
Glucose-Capillary: 115 mg/dL — ABNORMAL HIGH (ref 70–99)
Glucose-Capillary: 127 mg/dL — ABNORMAL HIGH (ref 70–99)

## 2021-08-08 MED ORDER — OXYCODONE HCL 5 MG PO TABS: 5.0000 mg | ORAL_TABLET | Freq: Four times a day (QID) | ORAL | 0 refills | Status: DC | PRN

## 2021-08-08 MED ORDER — SODIUM CHLORIDE 0.9% IV SOLUTION
Freq: Once | INTRAVENOUS | Status: AC
Start: 2021-08-08 — End: 2021-08-09

## 2021-08-08 NOTE — Progress Notes (Signed)
PATIENT ID: [name]  MRN: [id number]  DOB/AGE:  1935/11/17 / 86 y.o.  ?2 Days Post-Op Procedure(s) (LRB): ?INTRAMEDULLARY (IM) NAIL INTERTROCHANTRIC (Left) ? ?Subjective: ?Pain is moderate in left hip and thigh.  No c/o SOB but reports that she has tenderness over her chest. Denies chest pain and pressure.   ? ? ?Objective: ?Vital signs in last 24 hours: ?Temp:  [97.3 ?F (36.3 ?C)-98.4 ?F (36.9 ?C)] 98.3 ?F (36.8 ?C) (04/03 7026) ?Pulse Rate:  [69-94] 94 (04/03 0728) ?Resp:  [12-20] 19 (04/03 0728) ?BP: (104-150)/(44-68) 150/46 (04/03 0728) ?SpO2:  [90 %-100 %] 91 % (04/03 0728) ? ?Intake/Output from previous day: ?04/02 0701 - 04/03 0700 ?In: 32 [P.O.:980] ?Out: -  ?Intake/Output this shift: ?No intake/output data recorded. ? ?Recent Labs  ?  08/05/21 ?1817 08/07/21 ?3785 08/08/21 ?0147  ?HGB 10.4* 8.1* 6.7*  ? ?Recent Labs  ?  08/07/21 ?0213 08/08/21 ?0147  ?WBC 11.9* 11.0*  ?RBC 3.05* 2.56*  ?HCT 25.6* 21.5*  ?PLT 255 210  ? ?Recent Labs  ?  08/05/21 ?1817 08/07/21 ?8850  ?NA 138 137  ?K 3.5 4.4  ?CL 98 96*  ?CO2 29 29  ?BUN 22 18  ?CREATININE 0.90 0.95  ?GLUCOSE 239* 333*  ?CALCIUM 8.4* 8.4*  ? ?Recent Labs  ?  08/05/21 ?1817  ?INR 1.0  ? ? ?Physical Exam: ?Neurologically intact ?Neurovascular intact ?Sensation intact distally ?Intact pulses distally ?Dorsiflexion/Plantar flexion intact ?Incision: dressing C/D/I ?No cellulitis present ?Compartment soft ? ?Assessment/Plan: ?2 Days Post-Op Procedure(s) (LRB): ?INTRAMEDULLARY (IM) NAIL INTERTROCHANTRIC (Left) ?  ?Advance diet ?Up with therapy ?Weight Bearing as Tolerated (WBAT) ?VTE prophylaxis:  aspirin '81mg'$  daily ?Patient will likely need discharge to SNF per PT. ? ?Follow up on 4/14 at [place]. Call to make appt.  ?Oxycodone Rx on chart ? ?Zailee Vallely L. Porterfield, PA-C ?08/08/2021, 8:05 AM  ? ?  ?

## 2021-08-08 NOTE — Progress Notes (Signed)
?PROGRESS NOTE ? ? ?[name]  XTG:[id number] DOB: Sep 19, 1935 DOA: 08/05/2021 ?PCP: Janora Norlander, DO  ?Brief Narrative:  ?86 year old white female lives at home ?DM TY 2 on Janumet, HLD, reflux, glaucoma, osteoporosis.  Last DEXA 2019 ?Prior falls with ORIF radial fracture left hip fracture previously ?Experienced mechanical fall on bringing in the trash can tripped fell landed on left side 10/10 pain ? ?ED work-up revealed leukocytosis 14.5 without signs symptoms of infection hemoglobin 10 heart rate 101 chronic RBBB CXR normal ? ?Hospital-Problem based course ? ?Left hip fracture ?Operative management under Dr. Tamera Punt today-pain control, weightbearing as tolerated ?anticoagulation = aspirin 81, outpatient follow-up etc. per Ortho ?ACS risk~ 7.5 %--likely will need SNF  ?DM TY 2 on orals ?CBG improved to 180-204-back on Janumet 4/2, keep sliding scale for now ?Anemia of expected blood loss with hemodilution perioperatively ?Transfused 08/08/21 1 unit of blood for hemoglobin 6.7 ?Repeat labs a.m. ?Likely reactive leukocytosis ?Repeat labs show postoperative drop in white count ?No fever-repeat in a.m. expectant management no antibiotics needed ?Uncontrolled hypertension, some tachycardia-component of pain from hip fracture ?Continue lisinopril 40, amlodipine 10 ?Added metoprolol 12.5 twice daily-sinus tachycardia overall improved ?Reflux ?Osteoporosis ?Vitamin D level 35--given 1 dose of 50,000 units vitamin D 4/1 ?Outpatient osteoporosis management as per PCP- ? ? ?DVT prophylaxis: Lovenox ?Code Status: DNR ?Family Communication: No family at the bedside ?Disposition:  ?IP ?  ?Consultants:  ?Orthopedics ? ?Procedures: None yet ? ?Antimicrobials: None ? ? ?Subjective: ? ?Quite weak-feels fatigued-quite significant pain when transfers attempted in the bed-some tachycardia on monitors ?She has not passed a stool for several days and is asking for laxative ? ? ?Objective: ?Vitals:  ? 08/08/21 0355 08/08/21  0543 08/08/21 2703 08/08/21 5009  ?BP: (!) 140/53 (!) 119/47 (!) 128/44 (!) 150/46  ?Pulse: 69 74 85 94  ?Resp: '18 16 14 19  '$ ?Temp: 97.6 ?F (36.4 ?C) (!) 97.3 ?F (36.3 ?C) 98.3 ?F (36.8 ?C) 98.3 ?F (36.8 ?C)  ?TempSrc: Oral Oral Oral Oral  ?SpO2:  96% 100% 91%  ?Weight:      ?Height:      ? ? ?Intake/Output Summary (Last 24 hours) at 08/08/2021 0840 ?Last data filed at 08/07/2021 2000 ?Gross per 24 hour  ?Intake 740 ml  ?Output --  ?Net 740 ml  ? ? ?Filed Weights  ? 08/05/21 1705 08/06/21 1344  ?Weight: 53.1 kg 53.1 kg  ? ? ?Examination: ? ?Coherent in some painful distress with movement in the bed ?ROM intact no focal deficit to upper extremities and right lower extremity-quite painful to move left lower extremity-dressings not removed from hip/upper thigh ?S1-S2 tachycardic with some PVCs ?Abdomen slightly distended no rebound some discomfort on deep palpation ?She does have a mild vesicular rash on her back but this does not seem severe-she is quite kyphotic ?Coherent ? ?Data Reviewed: personally reviewed  ? ?CBC ?   ?Component Value Date/Time  ? WBC 11.0 (H) 08/08/2021 0147  ? RBC 2.56 (L) 08/08/2021 0147  ? HGB 6.7 (LL) 08/08/2021 0147  ? HGB 12.8 08/23/2020 1439  ? HCT 21.5 (L) 08/08/2021 0147  ? HCT 41.9 08/23/2020 1439  ? PLT 210 08/08/2021 0147  ? PLT 439 08/23/2020 1439  ? MCV 84.0 08/08/2021 0147  ? MCV 84 08/23/2020 1439  ? MCH 26.2 08/08/2021 0147  ? MCHC 31.2 08/08/2021 0147  ? RDW 15.1 08/08/2021 0147  ? RDW 14.3 08/23/2020 1439  ? LYMPHSABS 1.6 08/08/2021 0147  ? LYMPHSABS 1.0 06/02/2020 1109  ?  MONOABS 1.4 (H) 08/08/2021 0147  ? EOSABS 0.3 08/08/2021 0147  ? EOSABS 0.2 06/02/2020 1109  ? BASOSABS 0.0 08/08/2021 0147  ? BASOSABS 0.1 06/02/2020 1109  ? ? ?  Latest Ref Rng & Units 08/07/2021  ?  2:13 AM 08/05/2021  ?  6:17 PM 08/03/2021  ?  8:27 AM  ?CMP  ?Glucose 70 - 99 mg/dL 333   239   149    ?BUN 8 - 23 mg/dL '18   22   20    '$ ?Creatinine 0.44 - 1.00 mg/dL 0.95   0.90   0.98    ?Sodium 135 - 145 mmol/L 137    138   137    ?Potassium 3.5 - 5.1 mmol/L 4.4   3.5   4.6    ?Chloride 98 - 111 mmol/L 96   98   94    ?CO2 22 - 32 mmol/L '29   29   27    '$ ?Calcium 8.9 - 10.3 mg/dL 8.4   8.4   9.5    ?Total Protein 6.5 - 8.1 g/dL 6.1    7.0    ?Total Bilirubin 0.3 - 1.2 mg/dL 0.6    0.4    ?Alkaline Phos 38 - 126 U/L 48    80    ?AST 15 - 41 U/L 20    19    ?ALT 0 - 44 U/L 15    13    ? ? ? ?Radiology Studies: ?DG C-Arm 1-60 Min-No Report ? ?Result Date: 08/06/2021 ?Fluoroscopy was utilized by the requesting physician.  No radiographic interpretation.  ? ?DG FEMUR MIN 2 VIEWS LEFT ? ?Result Date: 08/06/2021 ?CLINICAL DATA:  Left hip ORIF EXAM: LEFT FEMUR 2 VIEWS COMPARISON:  Left hip radiograph 08/05/2021 FINDINGS: Fluoroscopic images were obtained intraoperatively and submitted for post operative interpretation. Left intramedullary nail with lag screw and distal locking screw are in expected position. 4 images were obtained with 1 minute and 6 seconds of fluoroscopy time. Total radiation dose: 2.75 mGy Please see the performing provider's procedural report for further detail. IMPRESSION: Intraoperative imaging for left hip ORIF. Please see the performing provider's procedural report for further detail. Electronically Signed   By: Ileana Roup M.D.   On: 08/06/2021 16:35   ? ? ?Scheduled Meds: ? aspirin EC  81 mg Oral Daily  ? fluticasone  2 spray Each Nare Daily  ? heparin  5,000 Units Subcutaneous Q8H  ? hydrochlorothiazide  12.5 mg Oral Daily  ? insulin aspart  0-15 Units Subcutaneous TID WC  ? linagliptin  5 mg Oral Daily  ? lisinopril  10 mg Oral Daily  ? metFORMIN  1,000 mg Oral BID WC  ? metoprolol tartrate  12.5 mg Oral BID  ? pantoprazole  40 mg Oral Daily  ? ?Continuous Infusions: ? ? LOS: 3 days  ? ?Time spent: 13 ? ?Nita Sells, MD ?[facility] ?To contact the attending provider between 7A-7P or the covering provider during after hours 7P-7A, please log into the web site [email or web address] and access using  universal [place] password for that web site. If you do not have the password, please call the hospital operator. ? ?08/08/2021, 8:40 AM  ? ? ?

## 2021-08-08 NOTE — Care Management Important Message (Signed)
Important Message ? ?Patient Details  ?Name: [name] ?MRN: [id number] ?Date of Birth: 1935/06/03 ? ? ?Medicare Important Message Given:  Yes ? ? ? ? ?Levada Dy  Meriam Chojnowski-Martin ?08/08/2021, 3:36 PM ?

## 2021-08-08 NOTE — Evaluation (Signed)
Occupational Therapy Evaluation ?Patient Details ?Name: [name] ?MRN: [id number] ?DOB: 08/25/1935 ?Today's Date: 08/08/2021 ? ? ?History of Present Illness The pt is an 86 yo female presenting 3/31 with L hip fx after a fall. She is now s/p IM nailing of L hip on 4/1. PMH includes: CHF, DDD< DM II, GERD, HOH, HTN, and previous fall with ORIF of L hip.  ? ?Clinical Impression ?  ?Pt reports using cane/rollator at baseline for mobility, has an aide that assists with ADLs including dressing and bathing daily for 1-2 hrs. Pt currently mod-max A for ADLs, bed mobility, and transfers with RW. Pt BP stable during session: 123/46 (70) in supine, 134/119 (135) sitting EOB, and 149/121 (131) after chair transfer. Pt with LOB x2 in standing requiring mod physical assist to recover. Pt presenting with impairments listed below, will follow acutely. Continue to recommend SNF at d/c. ?   ? ?Recommendations for follow up therapy are one component of a multi-disciplinary discharge planning process, led by the attending physician.  Recommendations may be updated based on patient status, additional functional criteria and insurance authorization.  ? ?Follow Up Recommendations ? Skilled nursing-short term rehab (<3 hours/day)  ?  ?Assistance Recommended at Discharge Frequent or constant Supervision/Assistance  ?Patient can return home with the following Two people to help with walking and/or transfers;Two people to help with bathing/dressing/bathroom;Assistance with cooking/housework;Assist for transportation;Help with stairs or ramp for entrance ? ?  ?Functional Status Assessment ? Patient has had a recent decline in their functional status and demonstrates the ability to make significant improvements in function in a reasonable and predictable amount of time.  ?Equipment Recommendations ? None recommended by OT;Other (comment) (defer to next venue of care)  ?  ?Recommendations for Other Services   ? ? ?  ?Precautions /  Restrictions Precautions ?Precautions: Fall ?Restrictions ?Weight Bearing Restrictions: Yes ?LLE Weight Bearing: Weight bearing as tolerated  ? ?  ? ?Mobility Bed Mobility ?Overal bed mobility: Needs Assistance ?Bed Mobility: Supine to Sit ?  ?  ?Supine to sit: Mod assist, Max assist ?  ?  ?General bed mobility comments: for trunk elevation and to bring BLE off of bed ?  ? ?Transfers ?Overall transfer level: Needs assistance ?Equipment used: Rolling walker (2 wheels) ?Transfers: Sit to/from Stand, Bed to chair/wheelchair/BSC ?Sit to Stand: Mod assist ?Stand pivot transfers: Mod assist ?  ?  ?  ?  ?General transfer comment: mod-max A +2 for stand pivot transfer with RW ?  ? ?  ?Balance Overall balance assessment: Needs assistance ?Sitting-balance support: Bilateral upper extremity supported, Feet supported ?Sitting balance-Leahy Scale: Poor ?Sitting balance - Comments: R lateral lean with bedrail support ?  ?Standing balance support: Bilateral upper extremity supported, During functional activity, Reliant on assistive device for balance ?Standing balance-Leahy Scale: Poor ?Standing balance comment: 2 person assist, reliant on external support, ?  ?  ?  ?  ?  ?  ?  ?  ?  ?  ?  ?   ? ?ADL either performed or assessed with clinical judgement  ? ?ADL Overall ADL's : Needs assistance/impaired ?Eating/Feeding: Set up;Sitting ?  ?Grooming: Set up;Sitting ?  ?Upper Body Bathing: Moderate assistance;Sitting ?  ?Lower Body Bathing: Maximal assistance;Sitting/lateral leans;Bed level ?  ?Upper Body Dressing : Moderate assistance;Sitting;Bed level ?  ?Lower Body Dressing: Maximal assistance;Sitting/lateral leans ?Lower Body Dressing Details (indicate cue type and reason): to don socks ?Toilet Transfer: Maximal assistance;+2 for physical assistance;BSC/3in1;Stand-pivot ?Toilet Transfer Details (indicate cue type and  reason): simulated to chair ?Toileting- Clothing Manipulation and Hygiene: Moderate assistance;Sitting/lateral  lean ?  ?  ?  ?Functional mobility during ADLs: Maximal assistance;+2 for safety/equipment;Rolling walker (2 wheels) ?   ? ? ? ?Vision Baseline Vision/History: 1 Wears glasses ?Vision Assessment?: No apparent visual deficits  ?   ?Perception   ?  ?Praxis   ?  ? ?Pertinent Vitals/Pain Pain Assessment ?Pain Assessment: Faces ?Pain Score: 5  ?Faces Pain Scale: Hurts even more ?Pain Location: L hip with mobility. ?Pain Descriptors / Indicators: Discomfort, Grimacing, Moaning ?Pain Intervention(s): Limited activity within patient's tolerance, Monitored during session  ? ? ? ?Hand Dominance Right ?  ?Extremity/Trunk Assessment Upper Extremity Assessment ?Upper Extremity Assessment: Generalized weakness ?  ?Lower Extremity Assessment ?Lower Extremity Assessment: Defer to PT evaluation ?  ?Cervical / Trunk Assessment ?Cervical / Trunk Assessment: Kyphotic ?  ?Communication Communication ?Communication: No difficulties ?  ?Cognition Arousal/Alertness: Awake/alert ?Behavior During Therapy: [facility] for tasks assessed/performed, Anxious ?Overall Cognitive Status: Impaired/Different from baseline ?Area of Impairment: Safety/judgement ?  ?  ?  ?  ?  ?  ?  ?  ?  ?  ?  ?  ?Safety/Judgement: Decreased awareness of deficits, Decreased awareness of safety ?  ?  ?General Comments: anxious with movement,requiring increased cuing for hand placement/problem soliving technique for standing ?  ?  ?General Comments  VSS on RA, BP stable during session ? ?  ?Exercises   ?  ?Shoulder Instructions    ? ? ?Home Living Family/patient expects to be discharged to:: Private residence ?Living Arrangements: Alone ?Available Help at Discharge: Personal care attendant (comes daily, ~2 hrs/day) ?Type of Home: House ?Home Access: Stairs to enter ?Entrance Stairs-Number of Steps: 2 ?Entrance Stairs-Rails: None ?Home Layout: One level ?  ?  ?Bathroom Shower/Tub: Tub/shower unit ?  ?Bathroom Toilet: Standard ?Bathroom Accessibility: Yes ?How Accessible:  Accessible via walker (if turned sideways) ?Home Equipment: Rollator (4 wheels);Cane - single point;BSC/3in1;Shower seat ?  ?Additional Comments: does not wear O2 at home, possibly getting ramp for home entrance ?  ? ?  ?Prior Functioning/Environment Prior Level of Function : Needs assist ?  ?  ?  ?Physical Assist : Mobility (physical);ADLs (physical) ?Mobility (physical): Transfers;Gait ?ADLs (physical): Bathing;Dressing;IADLs ?Mobility Comments: pt uses cane for mobility in the house ?ADLs Comments: pt reports aide assist 2 days/week with shower, other days with wash up. helps the pt dress. reports independence with toileting ?  ? ?  ?  ?OT Problem List: Decreased strength;Decreased range of motion;Decreased activity tolerance;Impaired balance (sitting and/or standing);Decreased knowledge of use of DME or AE;Decreased safety awareness ?  ?   ?OT Treatment/Interventions: Self-care/ADL training;Therapeutic exercise;DME and/or AE instruction;Therapeutic activities;Patient/family education;Balance training  ?  ?OT Goals(Current goals can be found in the care plan section) Acute Rehab OT Goals ?Patient Stated Goal: decrease pain ?OT Goal Formulation: With patient ?Time For Goal Achievement: 08/22/21 ?Potential to Achieve Goals: Fair ?ADL Goals ?Pt Will Perform Upper Body Dressing: sitting;with min assist ?Pt Will Perform Lower Body Dressing: with mod assist;sitting/lateral leans;bed level ?Pt Will Transfer to Toilet: with mod assist;with +2 assist;stand pivot transfer;bedside commode  ?OT Frequency: Min 3X/week ?  ? ?Co-evaluation   ?  ?  ?  ?  ? ?  ?AM-PAC OT "6 Clicks" Daily Activity     ?Outcome Measure Help from another person eating meals?: None ?Help from another person taking care of personal grooming?: A Little ?Help from another person toileting, which includes using toliet, bedpan, or urinal?: A Lot ?  Help from another person bathing (including washing, rinsing, drying)?: A Lot ?Help from another person to put  on and taking off regular upper body clothing?: A Lot ?Help from another person to put on and taking off regular lower body clothing?: Total ?6 Click Score: 14 ?  ?End of Session Equipment Utilized During Treatment: Gait be

## 2021-08-08 NOTE — Progress Notes (Signed)
Pt noted with NSR,BBB, 15 beats of wide QRS SVT rate 160s pt reports pain to surgical site to hip, otherwise no other complaints of pain or discomfort. Pt was asleep and awakened by this Probation officer for assessment. Elizabeth Ouma-NP notified, no new orders at this time.  ?

## 2021-08-08 NOTE — TOC CAGE-AID Note (Signed)
Transition of Care (TOC) - CAGE-AID Screening ? ? ?Patient Details  ?Name: [name] ?MRN: [id number] ?Date of Birth: 1935-06-11 ? ?Transition of Care (TOC) CM/SW Contact:    ?Rahcel Shutes C Tarpley-Carter, LCSWA ?Phone Number: ?08/08/2021, 1:01 PM ? ? ?Clinical Narrative: ?Pt participated in [place].  Pt stated she does not use substance or ETOH.  Pt was not offered resources, due to no usage of substance or ETOH.   ? ?Passenger transport manager, MSW, LCSW-A ?Pronouns:  She/Her/Hers ?[facility] of [facility] ?Clinical Social Worker ?Direct Number:  [phone] ?[email or web address] ? ?CAGE-AID Screening: ?  ? ?Have You Ever Felt You Ought to Cut Down on Your Drinking or Drug Use?: No ?Have People Annoyed You By Critizing Your Drinking Or Drug Use?: No ?Have You Felt Bad Or Guilty About Your Drinking Or Drug Use?: No ?Have You Ever Had a Drink or Used Drugs First Thing In The Morning to Steady Your Nerves or to Get Rid of a Hangover?: No ?CAGE-AID Score: 0 ? ?Substance Abuse Education Offered: No ? ?  ? ? ? ? ? ? ?

## 2021-08-08 NOTE — Progress Notes (Signed)
Room Air oxygen 85% ?Walking oxygen saturation 92% on 3L ?

## 2021-08-09 LAB — CBC WITH DIFFERENTIAL/PLATELET
Abs Immature Granulocytes: 0.05 10*3/uL (ref 0.00–0.07)
Basophils Absolute: 0.1 10*3/uL (ref 0.0–0.1)
Basophils Relative: 1 %
Eosinophils Absolute: 0.4 10*3/uL (ref 0.0–0.5)
Eosinophils Relative: 4 %
HCT: 26.3 % — ABNORMAL LOW (ref 36.0–46.0)
Hemoglobin: 8.5 g/dL — ABNORMAL LOW (ref 12.0–15.0)
Immature Granulocytes: 1 %
Lymphocytes Relative: 10 %
Lymphs Abs: 1 10*3/uL (ref 0.7–4.0)
MCH: 27.6 pg (ref 26.0–34.0)
MCHC: 32.3 g/dL (ref 30.0–36.0)
MCV: 85.4 fL (ref 80.0–100.0)
Monocytes Absolute: 1.2 10*3/uL — ABNORMAL HIGH (ref 0.1–1.0)
Monocytes Relative: 12 %
Neutro Abs: 7.2 10*3/uL (ref 1.7–7.7)
Neutrophils Relative %: 72 %
Platelets: 236 10*3/uL (ref 150–400)
RBC: 3.08 MIL/uL — ABNORMAL LOW (ref 3.87–5.11)
RDW: 15.5 % (ref 11.5–15.5)
WBC: 9.9 10*3/uL (ref 4.0–10.5)
nRBC: 0 % (ref 0.0–0.2)

## 2021-08-09 LAB — TYPE AND SCREEN
ABO/RH(D): A POS
Antibody Screen: NEGATIVE
Unit division: 0

## 2021-08-09 LAB — COMPREHENSIVE METABOLIC PANEL
ALT: 9 U/L (ref 0–44)
AST: 24 U/L (ref 15–41)
Albumin: 2.7 g/dL — ABNORMAL LOW (ref 3.5–5.0)
Alkaline Phosphatase: 49 U/L (ref 38–126)
Anion gap: 11 (ref 5–15)
BUN: 32 mg/dL — ABNORMAL HIGH (ref 8–23)
CO2: 25 mmol/L (ref 22–32)
Calcium: 8.5 mg/dL — ABNORMAL LOW (ref 8.9–10.3)
Chloride: 99 mmol/L (ref 98–111)
Creatinine, Ser: 1.06 mg/dL — ABNORMAL HIGH (ref 0.44–1.00)
GFR, Estimated: 52 mL/min — ABNORMAL LOW (ref >60)
Glucose, Bld: 155 mg/dL — ABNORMAL HIGH (ref 70–99)
Potassium: 4.5 mmol/L (ref 3.5–5.1)
Sodium: 135 mmol/L (ref 135–145)
Total Bilirubin: 0.6 mg/dL (ref 0.3–1.2)
Total Protein: 6 g/dL — ABNORMAL LOW (ref 6.5–8.1)

## 2021-08-09 LAB — TROPONIN I (HIGH SENSITIVITY): Troponin I (High Sensitivity): 10 ng/L (ref <18)

## 2021-08-09 LAB — BPAM RBC
Blood Product Expiration Date: 202305032359
ISSUE DATE / TIME: 202304030551
Unit Type and Rh: 6200

## 2021-08-09 LAB — GLUCOSE, CAPILLARY
Glucose-Capillary: 140 mg/dL — ABNORMAL HIGH (ref 70–99)
Glucose-Capillary: 139 mg/dL — ABNORMAL HIGH (ref 70–99)
Glucose-Capillary: 166 mg/dL — ABNORMAL HIGH (ref 70–99)
Glucose-Capillary: 99 mg/dL (ref 70–99)

## 2021-08-09 MED ORDER — METOPROLOL TARTRATE 25 MG PO TABS
25.0000 mg | ORAL_TABLET | Freq: Two times a day (BID) | ORAL | Status: DC
  Administered 2021-08-09 – 2021-08-10 (×2): 25 mg via ORAL
  Filled 2021-08-09: qty 1

## 2021-08-09 MED ORDER — HYDRALAZINE HCL 25 MG PO TABS: 25.0000 mg | ORAL_TABLET | Freq: Four times a day (QID) | ORAL | 11 refills | Status: DC

## 2021-08-09 MED ORDER — METOPROLOL TARTRATE 25 MG PO TABS
25.0000 mg | ORAL_TABLET | Freq: Two times a day (BID) | ORAL | Status: AC
Start: 2021-08-09 — End: ?

## 2021-08-09 MED ORDER — SENNA 8.6 MG PO TABS: 1.0000 | ORAL_TABLET | Freq: Every day | ORAL | 0 refills | Status: AC

## 2021-08-09 MED ORDER — LISINOPRIL 10 MG PO TABS: 10.0000 mg | ORAL_TABLET | Freq: Every day | ORAL | Status: DC

## 2021-08-09 MED ORDER — METOPROLOL TARTRATE 25 MG PO TABS: 12.5000 mg | ORAL_TABLET | Freq: Two times a day (BID) | ORAL | 1 refills | Status: DC

## 2021-08-09 MED ORDER — METOPROLOL TARTRATE 25 MG PO TABS
25.0000 mg | ORAL_TABLET | Freq: Two times a day (BID) | ORAL | Status: DC
Start: 2021-08-09 — End: 2021-08-09
  Filled 2021-08-09: qty 1

## 2021-08-09 MED ORDER — LIDOCAINE HCL URETHRAL/MUCOSAL 2 % EX GEL
1.0000 "application " | Freq: Once | CUTANEOUS | Status: AC
  Administered 2021-08-09: 1 via TOPICAL
  Filled 2021-08-09: qty 6

## 2021-08-09 MED ORDER — HYDROCHLOROTHIAZIDE 12.5 MG PO TABS: 12.5000 mg | ORAL_TABLET | Freq: Every day | ORAL | Status: DC

## 2021-08-09 MED ORDER — METOPROLOL TARTRATE 5 MG/5ML IV SOLN
2.5000 mg | Freq: Four times a day (QID) | INTRAVENOUS | Status: DC | PRN
  Administered 2021-08-09: 2.5 mg via INTRAVENOUS
  Filled 2021-08-09: qty 5

## 2021-08-09 NOTE — NC FL2 (Signed)
?[place] MEDICAID FL2 LEVEL OF CARE SCREENING TOOL  ?  ? ?IDENTIFICATION  ?Patient Name: ?[name] Birthdate: [birth year] Sex: female Admission Date (Current Location): ?08/05/2021  ?[place] and [place] Number: ? [place] ?[id number] L Facility and Address:  ?[facility], [place] [address] [place], [place] [address] ?     Provider Number: ?[id number]  ?Attending Physician Name and Address:  ?Nita Sells, MD ? Relative Name and Phone Number:  ?Hopper,Hazel Sister [phone]  [phone] ?   ?Current Level of Care: ?Hospital Recommended Level of Care: ?[place] Prior Approval Number: ?  ? ?Date Approved/Denied: ?  PASRR Number: ?[id number] ? ?Discharge Plan: ?SNF ?  ? ?Current Diagnoses: ?Patient Active Problem List  ? Diagnosis Date Noted  ? Intertrochanteric fracture of left femur, closed, initial encounter ([place]) 08/05/2021  ? Leukocytosis 08/05/2021  ? Impaired mobility and endurance 01/12/2021  ? Pulmonary artery hypertension ([place]) 01/12/2021  ? Hypoalbuminemia due to protein-calorie malnutrition ([place]) 08/14/2020  ? 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 Hyperlipidemia   ? Insomnia   ? Bilateral lower extremity edema 06/30/2020  ? Chronic combined systolic and diastolic heart failure ([place]) 06/28/2020  ? Hypomagnesemia 12/11/2019  ? Glaucoma   ? GERD (gastroesophageal reflux disease)   ? Hypocalcemia   ? Diabetes mellitus type II, controlled ([place])   ? Shortness of breath   ? Hyperglycemia due to diabetes mellitus ([place]) 09/01/2019  ? Hypertension associated with diabetes ([place]) 09/01/2019  ? Hyperlipidemia associated with type 2 diabetes mellitus ([place]) 09/01/2019  ? Microalbuminuria due to type 2 diabetes mellitus ([place]) 06/18/2019  ? Anxiety, generalized 08/16/2017  ? Coronary artery disease due to lipid rich plaque 09/07/2015  ? Pain in both wrists 08/08/2015  ? Osteoporosis 07/16/2014  ? Primary osteoarthritis involving multiple joints 07/16/2014  ? ? ?Orientation  RESPIRATION BLADDER Height & Weight   ?  ?Self, Time, Situation, Place ? O2 Incontinent Weight: 117 lb (53.1 kg) ?Height:  5' (152.4 cm)  ?BEHAVIORAL SYMPTOMS/MOOD NEUROLOGICAL BOWEL NUTRITION STATUS  ?    Continent Diet (see discharge summary)  ?AMBULATORY STATUS COMMUNICATION OF NEEDS Skin   ?Total Care Verbally Surgical wounds ?  ?  ?  ?    ?     ?     ? ? ?Personal Care Assistance Level of Assistance  ?Bathing, Feeding, Dressing Bathing Assistance: Maximum assistance ?Feeding assistance: Limited assistance ?Dressing Assistance: Maximum assistance ?   ? ?Functional Limitations Info  ?Sight, Hearing, Speech Sight Info: Adequate ?Hearing Info: Adequate ?Speech Info: Adequate  ? ? ?SPECIAL CARE FACTORS FREQUENCY  ?PT (By licensed PT), OT (By licensed OT)   ?  ?PT Frequency: 5x week ?OT Frequency: 5x week ?  ?  ?  ?   ? ? ?Contractures Contractures Info: Not present  ? ? ?Additional Factors Info  ?Code Status, Allergies, Insulin Sliding Scale Code Status Info: DNR ?Allergies Info: codiene ?  ?Insulin Sliding Scale Info: Novolog, 0-15 units TID with meals, see discharge summary ?  ?   ? ?Current Medications (08/09/2021):  This is the current hospital active medication list ?Current Facility-Administered Medications  ?Medication Dose Route Frequency Provider Last Rate Last Admin  ? acetaminophen (TYLENOL) tablet 650 mg  650 mg Oral Q6H PRN Porterfield, Amber, PA-C   650 mg at 08/08/21 0818  ? Or  ? acetaminophen (TYLENOL) suppository 650 mg  650 mg Rectal Q6H PRN Porterfield, Amber, PA-C      ? albuterol (PROVENTIL) (2.5 MG/3ML) 0.083% nebulizer solution  2.5 mg  2.5 mg Nebulization Q6H PRN Porterfield, Amber, PA-C      ? aspirin EC tablet 81 mg  81 mg Oral Daily Porterfield, Amber, PA-C   81 mg at 08/09/21 3212  ? cyclobenzaprine (FLEXERIL) tablet 5 mg  5 mg Oral TID PRN Porterfield, Amber, PA-C   5 mg at 08/08/21 1302  ? fluticasone (FLONASE) 50 MCG/ACT nasal spray 2 spray  2 spray Each Nare Daily Porterfield, Amber,  PA-C   2 spray at 08/09/21 2482  ? heparin injection 5,000 Units  5,000 Units Subcutaneous Q8H Porterfield, Amber, PA-C   5,000 Units at 08/09/21 5003  ? hydrochlorothiazide (HYDRODIURIL) tablet 12.5 mg  12.5 mg Oral Daily Nita Sells, MD   12.5 mg at 08/09/21 7048  ? insulin aspart (novoLOG) injection 0-15 Units  0-15 Units Subcutaneous TID WC Porterfield, Amber, PA-C   2 Units at 08/09/21 0830  ? linagliptin (TRADJENTA) tablet 5 mg  5 mg Oral Daily Paytes, Austin A, RPH   5 mg at 08/09/21 8891  ? lisinopril (ZESTRIL) tablet 10 mg  10 mg Oral Daily Nita Sells, MD   10 mg at 08/09/21 6945  ? menthol-cetylpyridinium (CEPACOL) lozenge 3 mg  1 lozenge Oral PRN Porterfield, Amber, PA-C      ? Or  ? phenol (CHLORASEPTIC) mouth spray 1 spray  1 spray Mouth/Throat PRN Porterfield, Amber, PA-C      ? metFORMIN (GLUCOPHAGE) tablet 1,000 mg  1,000 mg Oral BID WC Paytes, Austin A, RPH   1,000 mg at 08/09/21 0824  ? metoprolol tartrate (LOPRESSOR) tablet 12.5 mg  12.5 mg Oral BID Porterfield, Amber, PA-C   12.5 mg at 08/09/21 0388  ? morphine (PF) 2 MG/ML injection 1 mg  1 mg Intravenous Q2H PRN Nita Sells, MD      ? ondansetron (ZOFRAN) tablet 4 mg  4 mg Oral Q6H PRN Porterfield, Amber, PA-C      ? Or  ? ondansetron (ZOFRAN) injection 4 mg  4 mg Intravenous Q6H PRN Porterfield, Amber, PA-C   4 mg at 08/06/21 0751  ? oxyCODONE (Oxy IR/ROXICODONE) immediate release tablet 5 mg  5 mg Oral Q4H PRN Porterfield, Amber, PA-C   5 mg at 08/09/21 0446  ? pantoprazole (PROTONIX) EC tablet 40 mg  40 mg Oral Daily Porterfield, Amber, PA-C   40 mg at 08/09/21 8280  ? traMADol (ULTRAM) tablet 50 mg  50 mg Oral Q6H PRN Nita Sells, MD      ? traZODone (DESYREL) tablet 25-50 mg  25-50 mg Oral QHS PRN Porterfield, Amber, PA-C      ? ? ? ?Discharge Medications: ?Please see discharge summary for a list of discharge medications. ? ?Relevant Imaging Results: ? ?Relevant Lab Results: ? ? ?Additional  Information ?SSN: [id number].  Pt is vaccinated fr covid with 2 boosters. ? ?Joanne Chars, LCSW ? ? ? ? ?

## 2021-08-09 NOTE — Progress Notes (Signed)
Notified MD Samtani about pt HR being as high as 150. It is not sustaining but just bursts. Tele also called this nurse to  inform me.  ?

## 2021-08-09 NOTE — Progress Notes (Signed)
Physical Therapy Treatment ?Patient Details ?Name: [name] ?MRN: [id number] ?DOB: 08/04/1935 ?Today's Date: 08/09/2021 ? ? ?History of Present Illness The pt is an 86 yo female presenting 3/31 with L hip fx after a fall. She is now s/p IM nailing of L hip on 4/1. PMH includes: CHF, DDD< DM II, GERD, HOH, HTN, and previous fall with ORIF of L hip. ? ?  ?PT Comments  ? ? Pt received reclined in bed and agreeable to session focused on progression of transfers. Pt requiring mod a to come to sitting EOB for LE management and to elevate trunk, pt continues with right lateral trunk lean and inability to come to midline, family at bedside and reporting this as baseline. Pt requiring mod/max a to some to standing from very elevated EOB, attempted from normal height but pt unable to generate power through BLE to come to standing. Pt able to pivot to recliner with mod a, unable to advance feet secondary to pain and fatigue. Current plan remains appropriate to address deficits and maximize functional independence and decrease caregiver burden. Pt continues to benefit from skilled PT services to progress toward functional mobility goals.  ?  ?Recommendations for follow up therapy are one component of a multi-disciplinary discharge planning process, led by the attending physician.  Recommendations may be updated based on patient status, additional functional criteria and insurance authorization. ? ?Follow Up Recommendations ? Skilled nursing-short term rehab (<3 hours/day) ?  ?  ?Assistance Recommended at Discharge Frequent or constant Supervision/Assistance  ?Patient can return home with the following Two people to help with walking and/or transfers;Two people to help with bathing/dressing/bathroom;Direct supervision/assist for medications management;Assist for transportation;Help with stairs or ramp for entrance ?  ?Equipment Recommendations ? Rolling walker (2 wheels);Wheelchair (measurements PT);Wheelchair cushion  (measurements PT)  ?  ?Recommendations for Other Services OT consult ? ? ?  ?Precautions / Restrictions Precautions ?Precautions: Fall ?Restrictions ?Weight Bearing Restrictions: Yes ?LLE Weight Bearing: Weight bearing as tolerated  ?  ? ?Mobility ? Bed Mobility ?Overal bed mobility: Needs Assistance ?Bed Mobility: Supine to Sit ?  ?  ?Supine to sit: Mod assist ?  ?  ?General bed mobility comments: for trunk elevation and to bring BLE off of bed ?  ? ?Transfers ?Overall transfer level: Needs assistance ?Equipment used: Rolling walker (2 wheels) ?Transfers: Sit to/from Stand, Bed to chair/wheelchair/BSC ?Sit to Stand: Mod assist, From elevated surface ?Stand pivot transfers: Mod assist, Max assist ?  ?  ?  ?  ?General transfer comment: mod-max A for stand pivot transfer with RW, pt unable to advance feet depsite heavy multimodal cues ?  ? ?Ambulation/Gait ?  ?  ?  ?  ?  ?  ?  ?General Gait Details: unable ? ? ?Stairs ?  ?  ?  ?  ?  ? ? ?Wheelchair Mobility ?  ? ?Modified Rankin (Stroke Patients Only) ?  ? ? ?  ?Balance Overall balance assessment: Needs assistance ?Sitting-balance support: Bilateral upper extremity supported, Feet supported ?Sitting balance-Leahy Scale: Poor ?Sitting balance - Comments: R lateral lean with bedrail support ?  ?Standing balance support: Bilateral upper extremity supported, During functional activity, Reliant on assistive device for balance ?Standing balance-Leahy Scale: Poor ?Standing balance comment: reliant on external support, ?  ?  ?  ?  ?  ?  ?  ?  ?  ?  ?  ?  ? ?  ?Cognition Arousal/Alertness: Awake/alert ?Behavior During Therapy: [facility] for tasks assessed/performed, Anxious ?Overall Cognitive Status:  Impaired/Different from baseline ?Area of Impairment: Safety/judgement ?  ?  ?  ?  ?  ?  ?  ?  ?  ?  ?  ?  ?Safety/Judgement: Decreased awareness of deficits, Decreased awareness of safety ?  ?  ?General Comments: anxious with movement,requiring increased cuing for hand  placement/problem soliving technique for standing ?  ?  ? ?  ?Exercises General Exercises - Lower Extremity ?Ankle Circles/Pumps: AROM, Both, 20 reps, Seated (long sitting) ?Quad Sets: AROM, Left, 10 reps, Seated (long sitting) ?Gluteal Sets: AROM, Both, 10 reps, Seated (long sitting) ? ?  ?General Comments   ?  ?  ? ?Pertinent Vitals/Pain Pain Assessment ?Pain Assessment: Faces ?Faces Pain Scale: Hurts little more ?Pain Location: L hip with mobility. ?Pain Descriptors / Indicators: Discomfort, Grimacing, Moaning ?Pain Intervention(s): Limited activity within patient's tolerance, Monitored during session, Repositioned  ? ? ?Home Living   ?  ?  ?  ?  ?  ?  ?  ?  ?  ?   ?  ?Prior Function    ?  ?  ?   ? ?PT Goals (current goals can now be found in the care plan section) Acute Rehab PT Goals ?PT Goal Formulation: With patient ?Time For Goal Achievement: 08/21/21 ? ?  ?Frequency ? ? ? Min 3X/week ? ? ? ?  ?PT Plan    ? ? ?Co-evaluation   ?  ?  ?  ?  ? ?  ?AM-PAC PT "6 Clicks" Mobility   ?Outcome Measure ? Help needed turning from your back to your side while in a flat bed without using bedrails?: A Lot ?Help needed moving from lying on your back to sitting on the side of a flat bed without using bedrails?: A Lot ?Help needed moving to and from a bed to a chair (including a wheelchair)?: A Lot ?Help needed standing up from a chair using your arms (e.g., wheelchair or bedside chair)?: Total ?Help needed to walk in hospital room?: Total ?Help needed climbing 3-5 steps with a railing? : Total ?6 Click Score: 9 ? ?  ?End of Session Equipment Utilized During Treatment: Gait belt ?Activity Tolerance: Patient tolerated treatment well ?Patient left: in chair;with call bell/phone within reach;with family/visitor present;with chair alarm set ?Nurse Communication: Mobility status ?PT Visit Diagnosis: Unsteadiness on feet (R26.81);Other abnormalities of gait and mobility (R26.89);History of falling (Z91.81);Pain ?Pain - Right/Left:  Left ?Pain - part of body: Hip;Leg ?  ? ? ?Time: 6333-5456 ?PT Time Calculation (min) (ACUTE ONLY): 23 min ? ?Charges:  $Therapeutic Exercise: 8-22 mins ?$Therapeutic Activity: 8-22 mins          ?          ? ?Audry Riles. PTA ?Acute Rehabilitation Services ?Office: [phone] ? ? ? ?Betsey Holiday Khayree Delellis ?08/09/2021, 1:00 PM ? ?

## 2021-08-09 NOTE — TOC Initial Note (Addendum)
Transition of Care (TOC) - Initial/Assessment Note  ? ? ?Patient Details  ?Name: [name] ?MRN: [id number] ?Date of Birth: November 16, 1935 ? ?Transition of Care [facility]) CM/SW Contact:    ?Joanne Chars, LCSW ?Phone Number: ?08/09/2021, 11:16 AM ? ?Clinical Narrative:   CSW met with pt regarding DC recommendation for SNF.  Pt initially states she does not want SNF and wants to go home.  Pt is at home alone, does have support from sibling.  Permission given to speak with brother Dominica Severin, sister Thayer Headings, who are on their way to the hospital.  Pt called Thayer Headings and CSW spoke with her, they will discuss with pt once they arrive.  Pt does have [place] aide in place at home Mon-Fri 2 hours per day.  Pt is vaccinated for covid with 2 boosters. ? ?1030: CSW spoke with pt, [name].  MD had come by and reinforced need for SNF, pt now agreeable, from [facility], first choice is [facility].  Choice document given, referral sent out in hub for SNF.               ? ?1500: unable to reach [facility] center today.  CSW spoke with pt and siblings, gave update: [facility] cannot offer bed, [facility] can.  Pt does not want [facility]  They requested referral be sent to [facility], which was done. ? ?SNF auth request submitted in [place] with facility choice pending.   ? ?Expected Discharge Plan: [place] ?Barriers to Discharge: SNF Pending bed offer ? ? ?Patient Goals and CMS Choice ?Patient states their goals for this hospitalization and ongoing recovery are:: walking ?CMS Medicare.gov Compare Post Acute Care list provided to:: Patient ?Choice offered to / list presented to : Patient ? ?Expected Discharge Plan and Services ?Expected Discharge Plan: [place] ?In-house Referral: Clinical Social Work ?  ?Post Acute Care Choice: [place] ?Living arrangements for the past 2 months: [place] ?Expected Discharge Date: 08/09/21               ?  ?  ?  ?  ?  ?  ?  ?  ?  ?  ? ?Prior Living  Arrangements/Services ?Living arrangements for the past 2 months: [place] ?Lives with:: Self ?Patient language and need for interpreter reviewed:: Yes ?Do you feel safe going back to the place where you live?: Yes      ?Need for Family Participation in Patient Care: Yes (Comment) ?Care giver support system in place?: Yes (comment) ?Current home services: Homehealth aide (pt reports HH aid Mon-Fri for 2 hours per day) ?Criminal Activity/Legal Involvement Pertinent to Current Situation/Hospitalization: No - Comment as needed ? ?Activities of Daily Living ?Home Assistive Devices/Equipment: None ?ADL Screening (condition at time of admission) ?Patient's cognitive ability adequate to safely complete daily activities?: Yes ?Is the patient deaf or have difficulty hearing?: Yes ?Does the patient have difficulty seeing, even when wearing glasses/contacts?: No ?Does the patient have difficulty concentrating, remembering, or making decisions?: No ?Patient able to express need for assistance with ADLs?: Yes ?Does the patient have difficulty dressing or bathing?: Yes ?Independently performs ADLs?: No ?Communication: Independent ?Dressing (OT): Needs assistance ?Is this a change from baseline?: Change from baseline, expected to last <3days ?Grooming: Needs assistance ?Is this a change from baseline?: Change from baseline, expected to last <3 days ?Feeding: Independent ?Bathing: Needs assistance ?Is this a change from baseline?: Change from baseline, expected to last <3 days ?Toileting: Needs  assistance ?Is this a change from baseline?: Change from baseline, expected to last <3 days ?In/Out Bed: Needs assistance ?Is this a change from baseline?: Change from baseline, expected to last <3 days ?Walks in Home: Independent ?Does the patient have difficulty walking or climbing stairs?: Yes ?Weakness of Legs: Both ?Weakness of Arms/Hands: None ? ?Permission Sought/Granted ?Permission sought to share information with : Family  Supports ?Permission granted to share information with : Yes, Verbal Permission Granted ? Share Information with NAME: brother Dominica Severin, sister Thayer Headings ? Permission granted to share info w AGENCY: SNF ?   ?   ? ?Emotional Assessment ?Appearance:: Appears stated age ?Attitude/Demeanor/Rapport: Engaged ?Affect (typically observed): Appropriate, Pleasant ?Orientation: : Oriented to Self, Oriented to Place, Oriented to  Time, Oriented to Situation ?Alcohol / Substance Use: Not Applicable ?Psych Involvement: No (comment) ? ?Admission diagnosis:  Pubic ramus fracture, left, closed, initial encounter ([place]) [S32.592A] ?Intertrochanteric fracture of left femur, closed, initial encounter ([place]) [S72.142A] ?Displaced intertrochanteric fracture of left femur, initial encounter for closed fracture ([place]) [S72.142A] ?Patient Active Problem List  ? Diagnosis Date Noted  ? Intertrochanteric fracture of left femur, closed, initial encounter ([place]) 08/05/2021  ? Leukocytosis 08/05/2021  ? Impaired mobility and endurance 01/12/2021  ? Pulmonary artery hypertension ([place]) 01/12/2021  ? Hypoalbuminemia due to protein-calorie malnutrition ([place]) 08/14/2020  ? 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 Hyperlipidemia   ? Insomnia   ? Bilateral lower extremity edema 06/30/2020  ? Chronic combined systolic and diastolic heart failure ([place]) 06/28/2020  ? Hypomagnesemia 12/11/2019  ? Glaucoma   ? GERD (gastroesophageal reflux disease)   ? Hypocalcemia   ? Diabetes mellitus type II, controlled ([place])   ? Shortness of breath   ? Hyperglycemia due to diabetes mellitus ([place]) 09/01/2019  ? Hypertension associated with diabetes ([place]) 09/01/2019  ? Hyperlipidemia associated with type 2 diabetes mellitus ([place]) 09/01/2019  ? Microalbuminuria due to type 2 diabetes mellitus ([place]) 06/18/2019  ? Anxiety, generalized 08/16/2017  ? Coronary artery disease due to lipid rich plaque 09/07/2015  ? Pain in both wrists 08/08/2015  ? Osteoporosis 07/16/2014  ? Primary osteoarthritis  involving multiple joints 07/16/2014  ? ?PCP:  Janora Norlander, DO ?Pharmacy:   ?[facility], [place] [address] ?Phone: [phone] Fax: [phone] ? ? ? ? ?Social Determinants of Health (SDOH) Interventions ?  ? ?Readmission Risk Interventions ? ?  07/11/2020  ?  3:49 PM  ?Readmission Risk Prevention Plan  ?Transportation Screening Complete  ?PCP or Specialist Appt within 3-5 Days Complete  ?[place] or Home Care Consult Complete  ?Social Work Consult for [place] Planning/Counseling Complete  ?Palliative Care Screening Not Applicable  ?Medication Review Press photographer) Complete  ? ? ? ?

## 2021-08-09 NOTE — Progress Notes (Signed)
PATIENT ID: [name]  MRN: [id number]  DOB/AGE:  1936/03/10 / 86 y.o.  ?3 Days Post-Op Procedure(s) (LRB): ?INTRAMEDULLARY (IM) NAIL INTERTROCHANTRIC (Left) ? ?Subjective: ?Pain is mild in the left hip but patient continue to complain of soreness in her chest and sternum. Patient reports that she is feeling better and better with the left hip.  ? ?Objective: ?Vital signs in last 24 hours: ?Temp:  [97.8 ?F (36.6 ?C)-98.5 ?F (36.9 ?C)] 98 ?F (36.7 ?C) (04/04 1120) ?Pulse Rate:  [62-106] 88 (04/04 1120) ?Resp:  [16-26] 20 (04/04 1120) ?BP: (85-161)/(46-78) 136/70 (04/04 1120) ?SpO2:  [93 %-100 %] 98 % (04/04 1120) ? ?Intake/Output from previous day: ?04/03 0701 - 04/04 0700 ?In: 315 [Blood:315] ?Out: 400 [Urine:400] ?Intake/Output this shift: ?No intake/output data recorded. ? ?Recent Labs  ?  08/07/21 ?0213 08/08/21 ?0147 08/09/21 ?0135  ?HGB 8.1* 6.7* 8.5*  ? ?Recent Labs  ?  08/08/21 ?0147 08/09/21 ?0135  ?WBC 11.0* 9.9  ?RBC 2.56* 3.08*  ?HCT 21.5* 26.3*  ?PLT 210 236  ? ?Recent Labs  ?  08/07/21 ?0213 08/09/21 ?0135  ?NA 137 135  ?K 4.4 4.5  ?CL 96* 99  ?CO2 29 25  ?BUN 18 32*  ?CREATININE 0.95 1.06*  ?GLUCOSE 333* 155*  ?CALCIUM 8.4* 8.5*  ? ? ? ?Physical Exam: ?Neurologically intact ?Sensation intact distally ?Intact pulses distally ?Dorsiflexion/Plantar flexion intact ?Incision: dressing C/D/I ?No cellulitis present ?Compartment soft ? ?Assessment/Plan: ?3 Days Post-Op Procedure(s) (LRB): ?INTRAMEDULLARY (IM) NAIL INTERTROCHANTRIC (Left) ?  ?Advance diet ?Up with therapy ?Weight Bearing as Tolerated (WBAT) ?VTE prophylaxis:  aspirin '81mg'$  daily ? ?Plan for DC to SNF today per medicine. Hgb improved following transfusion yesterday. Patient's chest pain appears to be musculoskeletal in nature and not cardiac related. ?  ?Follow up on 4/14 at [facility]. Call to make appt.  ?Oxycodone Rx on chart ? ? ?Jacquees Gongora L. [place], PA-C ?08/09/2021, 3:12 PM  ? ?  ?

## 2021-08-09 NOTE — Discharge Summary (Signed)
?Physician Discharge Summary ?  ?Patient: [name] MRN: [id number] DOB: 31-Mar-1936  ?Admit date:     08/05/2021  ?Discharge date: 08/09/21  ?Discharge Physician: Nita Sells  ? ?PCP: Janora Norlander, DO  ? ?Recommendations at discharge:  ?Needs outpatient follow-up arranged by skilled facility with Dr. Malena Catholic 4/14-please arrange transport to the appointment ?Note medication changes discontinue torsemide added hydralazine 25 4 times daily given highly elevated systolic blood pressure as well as low-dose metoprolol 12.5 twice daily ?Will need basic metabolic panel in 2 to 3 days given mild azotemia secondary to blood loss anemia and prior to admission meds ?May require oxygen short-term given her kyphoscoliosis and altered anatomy and also postop state-Will need 3 L at all times temporarily on discharge to SNF ?Consider outpatient Prolia-vitamin D levels were good this hospital stay ? ? ?Discharge Diagnoses: ?Principal Problem: ?  Intertrochanteric fracture of left femur, closed, initial encounter ([place]) ?Active Problems: ?  Hypertension associated with diabetes ([place]) ?  Hyperlipidemia associated with type 2 diabetes mellitus ([place]) ?  GERD (gastroesophageal reflux disease) ?  Diabetes mellitus type II, controlled ([place]) ?  Leukocytosis ? ?Resolved Problems: ?  * No resolved hospital problems. * ? ?Hospital Course: ? ?86 year old white female lives at home ?DM TY 2 on Janumet, HLD, reflux, glaucoma, osteoporosis.  Last DEXA 2019 ?Prior falls with ORIF radial fracture left hip fracture previously ?Experienced mechanical fall on bringing in the trash can tripped fell landed on left side 10/10 pain ?  ?ED work-up revealed leukocytosis 14.5 without signs symptoms of infection hemoglobin 10 heart rate 101 chronic RBBB CXR normal ?  ?Hospital-Problem based course ?  ?Left hip fracture ?Operative management under Dr. Tamera Punt today-pain control, weightbearing as tolerated ?anticoagulation = aspirin 81,  outpatient follow-up etc. per Ortho ?ACS risk~ 7.5 %- ?Patient will be discharging to skilled facility ?DM TY 2 on orals ?CBG improved to 180-204-back on Janumet 4/2, ?Was transiently on sliding scale insulin will need A1c in about a month and a half or so ?Anemia of expected blood loss with hemodilution perioperatively ?Transfused 08/08/21 1 unit of blood for hemoglobin 6.7 ?Hemoglobin has durably stabilized patient is only on low-dose aspirin ?Likely reactive leukocytosis ?Repeat labs show postoperative drop in white count ?No fever-repeat in a.m. expectant management no antibiotics needed ?AKI during hospital stay secondary to dehydration blood loss ?Probably as a result of blood loss anemia in addition to prior to admission torsemide and HCTZ which we have discontinued ?See below discussion regarding hypertension ?Uncontrolled hypertension, some tachycardia-component of pain from hip fracture ?Discontinue lisinopril completely, discontinue HCTZ ?Can continue amlodipine 10 ?Added metoprolol 12.5 twice daily-sinus tachycardia overall improved ?Added hydralazine on discharge 4 times daily ?Reflux ?Osteoporosis ?Vitamin D level 35--given 1 dose of 50,000 units vitamin D 4/1 ?Outpatient osteoporosis management as per PCP- ? ? ?  ? ? ?Consultants: Orthopedics ?Procedures performed: IM nail as above ?Disposition: Skilled nursing facility ?Diet recommendation:  ?Discharge Diet Orders (From admission, onward)  ? ?  Start     Ordered  ? 08/09/21 0000  Diet - low sodium heart healthy       ? 08/09/21 1016  ? ?  ?  ? ?  ? ?Cardiac and Carb modified diet ?DISCHARGE MEDICATION: ?Allergies as of 08/09/2021   ? ?   Reactions  ? Codeine Nausea Only  ? ?  ? ?  ?Medication List  ?  ? ?STOP taking these medications   ? ?lisinopril 40 MG tablet ?Commonly known  as: ZESTRIL ?  ?OneTouch Ultra test strip ?Generic drug: glucose blood ?  ?potassium chloride 10 MEQ tablet ?Commonly known as: KLOR-CON ?  ?sildenafil 20 MG tablet ?Commonly  known as: Revatio ?  ?torsemide 20 MG tablet ?Commonly known as: DEMADEX ?  ?traZODone 50 MG tablet ?Commonly known as: DESYREL ?  ? ?  ? ?TAKE these medications   ? ?albuterol 108 (90 Base) MCG/ACT inhaler ?Commonly known as: VENTOLIN HFA ?Inhale 2 puffs into the lungs every 6 (six) hours as needed for wheezing or shortness of breath. ?  ?amLODipine 10 MG tablet ?Commonly known as: NORVASC ?Take 1 tablet (10 mg total) by mouth daily. ?  ?aspirin 81 MG tablet ?Take 81 mg by mouth daily. ?  ?atorvastatin 20 MG tablet ?Commonly known as: LIPITOR ?Take 1 tablet (20 mg total) by mouth daily. ?  ?calcium-vitamin D 500-200 MG-UNIT tablet ?Commonly known as: OSCAL WITH D ?Take 1 tablet by mouth. ?  ?Dexilant 60 MG capsule ?Generic drug: dexlansoprazole ?Take 1 capsule by mouth daily. ?  ?fluticasone 50 MCG/ACT nasal spray ?Commonly known as: FLONASE ?Place 2 sprays into both nostrils daily. ?  ?hydrALAZINE 25 MG tablet ?Commonly known as: APRESOLINE ?Take 1 tablet (25 mg total) by mouth 4 (four) times daily. ?  ?Janumet 50-1000 MG tablet ?Generic drug: sitaGLIPtin-metformin ?Take 0.5 tablets by mouth 2 (two) times daily with a meal. ?What changed: how much to take ?  ?latanoprost 0.005 % ophthalmic solution ?Commonly known as: XALATAN ?1 drop at bedtime. ?  ?Lumbar Back Brace/Support Pad Misc ?Velcro closing back brace.  Use as needed for back pain.  Use no more than 1 time per week. ?  ?magnesium oxide 400 (240 Mg) MG tablet ?Commonly known as: MAG-OX ?Take 1 tablet (400 mg total) by mouth daily. ?  ?metoprolol tartrate 25 MG tablet ?Commonly known as: LOPRESSOR ?Take 0.5 tablets (12.5 mg total) by mouth 2 (two) times daily. ?  ?multivitamin-iron-minerals-folic acid chewable tablet ?Chew 1 tablet by mouth daily. ?  ?oxyCODONE 5 MG immediate release tablet ?Commonly known as: Oxy IR/ROXICODONE ?Take 1 tablet (5 mg total) by mouth every 6 (six) hours as needed for moderate pain. ?  ?senna 8.6 MG Tabs tablet ?Commonly known  as: SENOKOT ?Take 1 tablet (8.6 mg total) by mouth daily. ?  ?timolol 0.5 % ophthalmic solution ?Commonly known as: TIMOPTIC ?Place 1 drop into both eyes daily. ?  ?ZyrTEC Allergy 10 MG Caps ?Generic drug: Cetirizine HCl ?Take 1 capsule by mouth daily. ?  ? ?  ? ?  ?  ? ? ?  ?Discharge Care Instructions  ?(From admission, onward)  ?  ? ? ?  ? ?  Start     Ordered  ? 08/08/21 0000  Weight bearing as tolerated       ?Question Answer Comment  ?Laterality left   ?Extremity Lower   ?  ? 08/08/21 0808  ? ?  ?  ? ?  ? ? Follow-up Information   ? ? [place], Museum/gallery conservator, [employer]. Schedule an appointment as soon as possible for a visit on 08/19/2021.   ?Specialty: Orthopedic Surgery ?Contact information: ?[address] ?[place] [address] ?[phone] ? ? ?  ?  ? ?  ?  ? ?  ? ?Discharge Exam: ?Filed Weights  ? 08/05/21 1705 08/06/21 1344  ?Weight: 53.1 kg 53.1 kg  ? ?Awake coherent no distress EOMI NCAT no chest pain today doing well overall sitting up in the bed ?No nausea  no vomiting ?Some antalgia to hip that has been repaired moving it gingerly however pain is better controlled ?Range of motion is painful but intact ?She is coherent ?Power is 5/5 ?S1-S2 no murmur ? ?Condition at discharge: fair ? ?The results of significant diagnostics from this hospitalization (including imaging, microbiology, ancillary and laboratory) are listed below for reference.  ? ?Imaging Studies: ?DG Chest 2 View ? ?Result Date: 08/05/2021 ?CLINICAL DATA:  Mechanical fall.  Left hip acute fracture. EXAM: CHEST - 2 VIEW COMPARISON:  Chest two views 09/20/2020; CT chest 08/13/2020 FINDINGS: Moderately decreased lung volumes. Mild-to-moderate dextrocurvature of the mid to lower thoracic spine. Mild elevation of the left hemidiaphragm. The cardiac silhouette is not well evaluated due to the overlying hemidiaphragm. Mild calcification within aortic arch. Moderate diffuse chronic interstitial thickening, similar to prior. Old healed posterior left  approximate sixth and seventh rib fractures. There are multiple thoracic and upper lumbar vertebral body compression fractures appearing unchanged from prior. This includes moderate anterior T11 and sever

## 2021-08-09 NOTE — Progress Notes (Signed)
Patienr reviewed ? ?Has some mild chest discomfort--but fleeting ?She also now seems to have sinus tach/bursts of SVT on monitors ?Will increase Metoprolol to 25 bid ?Give IV toprol 2 mg now ? ?Will ge tEKG and troponin and ask night team to follow ? ?Will ask night coverage to review--if she flips into sustained Afib, will need tx to [facility] ? ?Discussed with RN ? ?Verneita Griffes, MD ?[facility] ?6:42 PM ? ?

## 2021-08-10 DIAGNOSIS — M8000XS Age-related osteoporosis with current pathological fracture, unspecified site, sequela: Secondary | ICD-10-CM | POA: Diagnosis not present

## 2021-08-10 DIAGNOSIS — S72142A Displaced intertrochanteric fracture of left femur, initial encounter for closed fracture: Secondary | ICD-10-CM | POA: Diagnosis not present

## 2021-08-10 DIAGNOSIS — K219 Gastro-esophageal reflux disease without esophagitis: Secondary | ICD-10-CM | POA: Diagnosis not present

## 2021-08-10 DIAGNOSIS — Z7401 Bed confinement status: Secondary | ICD-10-CM | POA: Diagnosis not present

## 2021-08-10 DIAGNOSIS — Z9889 Other specified postprocedural states: Secondary | ICD-10-CM | POA: Diagnosis not present

## 2021-08-10 DIAGNOSIS — E1169 Type 2 diabetes mellitus with other specified complication: Secondary | ICD-10-CM | POA: Diagnosis not present

## 2021-08-10 DIAGNOSIS — M159 Polyosteoarthritis, unspecified: Secondary | ICD-10-CM | POA: Diagnosis not present

## 2021-08-10 DIAGNOSIS — L89626 Pressure-induced deep tissue damage of left heel: Secondary | ICD-10-CM | POA: Diagnosis not present

## 2021-08-10 DIAGNOSIS — G4700 Insomnia, unspecified: Secondary | ICD-10-CM | POA: Diagnosis not present

## 2021-08-10 DIAGNOSIS — H409 Unspecified glaucoma: Secondary | ICD-10-CM | POA: Diagnosis not present

## 2021-08-10 DIAGNOSIS — I2721 Secondary pulmonary arterial hypertension: Secondary | ICD-10-CM | POA: Diagnosis not present

## 2021-08-10 DIAGNOSIS — E1122 Type 2 diabetes mellitus with diabetic chronic kidney disease: Secondary | ICD-10-CM | POA: Diagnosis not present

## 2021-08-10 DIAGNOSIS — E1159 Type 2 diabetes mellitus with other circulatory complications: Secondary | ICD-10-CM | POA: Diagnosis not present

## 2021-08-10 DIAGNOSIS — M6281 Muscle weakness (generalized): Secondary | ICD-10-CM | POA: Diagnosis not present

## 2021-08-10 DIAGNOSIS — R0789 Other chest pain: Secondary | ICD-10-CM | POA: Diagnosis not present

## 2021-08-10 DIAGNOSIS — Z743 Need for continuous supervision: Secondary | ICD-10-CM | POA: Diagnosis not present

## 2021-08-10 DIAGNOSIS — S72142G Displaced intertrochanteric fracture of left femur, subsequent encounter for closed fracture with delayed healing: Secondary | ICD-10-CM | POA: Diagnosis not present

## 2021-08-10 DIAGNOSIS — R262 Difficulty in walking, not elsewhere classified: Secondary | ICD-10-CM | POA: Diagnosis not present

## 2021-08-10 DIAGNOSIS — L89612 Pressure ulcer of right heel, stage 2: Secondary | ICD-10-CM | POA: Diagnosis not present

## 2021-08-10 DIAGNOSIS — I5032 Chronic diastolic (congestive) heart failure: Secondary | ICD-10-CM | POA: Diagnosis not present

## 2021-08-10 DIAGNOSIS — R531 Weakness: Secondary | ICD-10-CM | POA: Diagnosis not present

## 2021-08-10 DIAGNOSIS — S32592S Other specified fracture of left pubis, sequela: Secondary | ICD-10-CM | POA: Diagnosis not present

## 2021-08-10 DIAGNOSIS — S72142S Displaced intertrochanteric fracture of left femur, sequela: Secondary | ICD-10-CM | POA: Diagnosis not present

## 2021-08-10 DIAGNOSIS — R000 Tachycardia, unspecified: Secondary | ICD-10-CM | POA: Diagnosis not present

## 2021-08-10 DIAGNOSIS — S32592D Other specified fracture of left pubis, subsequent encounter for fracture with routine healing: Secondary | ICD-10-CM | POA: Diagnosis not present

## 2021-08-10 DIAGNOSIS — R918 Other nonspecific abnormal finding of lung field: Secondary | ICD-10-CM | POA: Diagnosis not present

## 2021-08-10 DIAGNOSIS — I2510 Atherosclerotic heart disease of native coronary artery without angina pectoris: Secondary | ICD-10-CM | POA: Diagnosis not present

## 2021-08-10 DIAGNOSIS — E1165 Type 2 diabetes mellitus with hyperglycemia: Secondary | ICD-10-CM | POA: Diagnosis not present

## 2021-08-10 DIAGNOSIS — I4510 Unspecified right bundle-branch block: Secondary | ICD-10-CM | POA: Diagnosis not present

## 2021-08-10 DIAGNOSIS — Z741 Need for assistance with personal care: Secondary | ICD-10-CM | POA: Diagnosis not present

## 2021-08-10 DIAGNOSIS — Z9181 History of falling: Secondary | ICD-10-CM | POA: Diagnosis not present

## 2021-08-10 DIAGNOSIS — R278 Other lack of coordination: Secondary | ICD-10-CM | POA: Diagnosis not present

## 2021-08-10 DIAGNOSIS — M5137 Other intervertebral disc degeneration, lumbosacral region: Secondary | ICD-10-CM | POA: Diagnosis not present

## 2021-08-10 DIAGNOSIS — L89616 Pressure-induced deep tissue damage of right heel: Secondary | ICD-10-CM | POA: Diagnosis not present

## 2021-08-10 DIAGNOSIS — R2681 Unsteadiness on feet: Secondary | ICD-10-CM | POA: Diagnosis not present

## 2021-08-10 DIAGNOSIS — M62838 Other muscle spasm: Secondary | ICD-10-CM | POA: Diagnosis not present

## 2021-08-10 DIAGNOSIS — M6259 Muscle wasting and atrophy, not elsewhere classified, multiple sites: Secondary | ICD-10-CM | POA: Diagnosis not present

## 2021-08-10 DIAGNOSIS — R52 Pain, unspecified: Secondary | ICD-10-CM | POA: Diagnosis not present

## 2021-08-10 DIAGNOSIS — H9192 Unspecified hearing loss, left ear: Secondary | ICD-10-CM | POA: Diagnosis not present

## 2021-08-10 DIAGNOSIS — L89100 Pressure ulcer of unspecified part of back, unstageable: Secondary | ICD-10-CM | POA: Diagnosis not present

## 2021-08-10 DIAGNOSIS — I5042 Chronic combined systolic (congestive) and diastolic (congestive) heart failure: Secondary | ICD-10-CM | POA: Diagnosis not present

## 2021-08-10 DIAGNOSIS — D62 Acute posthemorrhagic anemia: Secondary | ICD-10-CM | POA: Diagnosis not present

## 2021-08-10 DIAGNOSIS — E782 Mixed hyperlipidemia: Secondary | ICD-10-CM | POA: Diagnosis not present

## 2021-08-10 DIAGNOSIS — B002 Herpesviral gingivostomatitis and pharyngotonsillitis: Secondary | ICD-10-CM | POA: Diagnosis not present

## 2021-08-10 DIAGNOSIS — Z4789 Encounter for other orthopedic aftercare: Secondary | ICD-10-CM | POA: Diagnosis not present

## 2021-08-10 DIAGNOSIS — M8008XD Age-related osteoporosis with current pathological fracture, vertebra(e), subsequent encounter for fracture with routine healing: Secondary | ICD-10-CM | POA: Diagnosis not present

## 2021-08-10 DIAGNOSIS — D492 Neoplasm of unspecified behavior of bone, soft tissue, and skin: Secondary | ICD-10-CM | POA: Diagnosis not present

## 2021-08-10 DIAGNOSIS — I152 Hypertension secondary to endocrine disorders: Secondary | ICD-10-CM | POA: Diagnosis not present

## 2021-08-10 DIAGNOSIS — E46 Unspecified protein-calorie malnutrition: Secondary | ICD-10-CM | POA: Diagnosis not present

## 2021-08-10 DIAGNOSIS — S72142D Displaced intertrochanteric fracture of left femur, subsequent encounter for closed fracture with routine healing: Secondary | ICD-10-CM | POA: Diagnosis not present

## 2021-08-10 DIAGNOSIS — N1831 Chronic kidney disease, stage 3a: Secondary | ICD-10-CM | POA: Diagnosis not present

## 2021-08-10 DIAGNOSIS — R0902 Hypoxemia: Secondary | ICD-10-CM | POA: Diagnosis not present

## 2021-08-10 LAB — BASIC METABOLIC PANEL
Anion gap: 8 (ref 5–15)
BUN: 33 mg/dL — ABNORMAL HIGH (ref 8–23)
CO2: 27 mmol/L (ref 22–32)
Calcium: 8.7 mg/dL — ABNORMAL LOW (ref 8.9–10.3)
Chloride: 96 mmol/L — ABNORMAL LOW (ref 98–111)
Creatinine, Ser: 0.88 mg/dL (ref 0.44–1.00)
GFR, Estimated: >60 mL/min (ref >60)
Glucose, Bld: 125 mg/dL — ABNORMAL HIGH (ref 70–99)
Potassium: 4.6 mmol/L (ref 3.5–5.1)
Sodium: 131 mmol/L — ABNORMAL LOW (ref 135–145)

## 2021-08-10 LAB — GLUCOSE, CAPILLARY
Glucose-Capillary: 241 mg/dL — ABNORMAL HIGH (ref 70–99)
Glucose-Capillary: 109 mg/dL — ABNORMAL HIGH (ref 70–99)

## 2021-08-10 MED ORDER — CYCLOBENZAPRINE HCL 5 MG PO TABS
5.0000 mg | ORAL_TABLET | Freq: Three times a day (TID) | ORAL | 0 refills | Status: AC | PRN
Start: 2021-08-10 — End: 2021-08-15

## 2021-08-10 MED ORDER — AMLODIPINE BESYLATE 5 MG PO TABS: 5.0000 mg | ORAL_TABLET | Freq: Every day | ORAL | Status: AC

## 2021-08-10 NOTE — TOC Progression Note (Addendum)
Transition of Care (TOC) - Progression Note  ? ? ?Patient Details  ?Name: [name] ?MRN: [id number] ?Date of Birth: November 25, 1935 ? ?Transition of Care (TOC) CM/SW Contact  ?Joanne Chars, LCSW ?Phone Number: ?08/10/2021, 8:53 AM ? ?Clinical Narrative:   Pt does receive bed offer from [facility].  CSW spoke with pt and she would like to accept this offer.  CSW spoke with sister Thayer Headings who is also in favor of this facility.  Message sent to [facility] with facility choice. ? ?1045: auth approved in [place]: [id number], 3 days: 4/5-4/7. ? ?Expected Discharge Plan: [place] ?Barriers to Discharge: SNF Pending bed offer ? ?Expected Discharge Plan and Services ?Expected Discharge Plan: [place] ?In-house Referral: Clinical Social Work ?  ?Post Acute Care Choice: [place] ?Living arrangements for the past 2 months: [place] ?Expected Discharge Date: 08/09/21               ?  ?  ?  ?  ?  ?  ?  ?  ?  ?  ? ? ?Social Determinants of Health (SDOH) Interventions ?  ? ?Readmission Risk Interventions ? ?  07/11/2020  ?  3:49 PM  ?Readmission Risk Prevention Plan  ?Transportation Screening Complete  ?PCP or Specialist Appt within 3-5 Days Complete  ?[place] or Home Care Consult Complete  ?Social Work Consult for [place] Planning/Counseling Complete  ?Palliative Care Screening Not Applicable  ?Medication Review Press photographer) Complete  ? ? ?

## 2021-08-10 NOTE — Progress Notes (Signed)
PATIENT ID: [name]  MRN: [id number]  DOB/AGE:  [birth year] / 86 y.o.  ?4 Days Post-Op Procedure(s) (LRB): ?INTRAMEDULLARY (IM) NAIL INTERTROCHANTRIC (Left) ? ?Subjective: ?Patient reports that her left hip is feeling well. Continues to complain of chest soreness when she is moving around. Reports that topical analgesic has helped. Patient denies sharp chest pain and shortness of breath.  ? ?Objective: ?Vital signs in last 24 hours: ?Temp:  [98 ?F (36.7 ?C)-98.1 ?F (36.7 ?C)] 98.1 ?F (36.7 ?C) (04/05 4098) ?Pulse Rate:  [60-120] 104 (04/05 0803) ?Resp:  [13-23] 23 (04/05 0803) ?BP: (85-158)/(49-70) 158/55 (04/05 0803) ?SpO2:  [97 %-100 %] 100 % (04/05 0803) ? ?Intake/Output from previous day: ?04/04 0701 - 04/05 0700 ?In: -  ?Out: 250 [Urine:250] ?Intake/Output this shift: ?No intake/output data recorded. ? ?Recent Labs  ?  08/08/21 ?0147 08/09/21 ?0135  ?HGB 6.7* 8.5*  ? ?Recent Labs  ?  08/08/21 ?0147 08/09/21 ?0135  ?WBC 11.0* 9.9  ?RBC 2.56* 3.08*  ?HCT 21.5* 26.3*  ?PLT 210 236  ? ?Recent Labs  ?  08/09/21 ?0135 08/10/21 ?0328  ?NA 135 131*  ?K 4.5 4.6  ?CL 99 96*  ?CO2 25 27  ?BUN 32* 33*  ?CREATININE 1.06* 0.88  ?GLUCOSE 155* 125*  ?CALCIUM 8.5* 8.7*  ? ? ? ?Physical Exam: ?Neurologically intact ?Neurovascular intact ?Sensation intact distally ?Intact pulses distally ?Dorsiflexion/Plantar flexion intact ?Incision: dressing C/D/I ?No cellulitis present ?Compartment soft ? ?Assessment/Plan: ?4 Days Post-Op Procedure(s) (LRB): ?INTRAMEDULLARY (IM) NAIL INTERTROCHANTRIC (Left) ?  ?Advance diet ?Up with therapy ?Weight Bearing as Tolerated (WBAT) ?VTE prophylaxis:  aspirin '81mg'$  daily ?  ?Plan for DC to SNF when medically stable. Patient's chest pain appears to be musculoskeletal in nature and not cardiac related but will defer to medicine as she does continue to have tachycardia.  ?  ?Follow up on 4/14 at [facility]. Call to make appt.  ?Oxycodone Rx on chart ? ? ?Sandip Power L. [place], PA-C ?08/10/2021, 8:21  AM  ? ?  ?

## 2021-08-10 NOTE — Progress Notes (Signed)
Occupational Therapy Treatment ?Patient Details ?Name: [name] ?MRN: [id number] ?DOB: 08-19-1935 ?Today's Date: 08/10/2021 ? ? ?History of present illness The pt is an 86 yo female presenting 3/31 with L hip fx after a fall. She is now s/p IM nailing of L hip on 4/1. PMH includes: CHF, DDD< DM II, GERD, HOH, HTN, and previous fall with ORIF of L hip. ?  ?OT comments ? Pt making slow progress towards goals, limited by pain and increased HR this session, pt tachycardic with HR ranging from 90's - 130's sitting EOB. Pt able to complete seated ADL tasks and LB exercises at EOB. Pt has strong R lateral lean and forward flexed posture in sitting requiring external support/stability when performing LB exercises. Pt declining chair transfer at this time due to fatigue. Pt presenting with impairments listed below, will follow acutely. Continue to recommend SNF at d/c.  ? ?Recommendations for follow up therapy are one component of a multi-disciplinary discharge planning process, led by the attending physician.  Recommendations may be updated based on patient status, additional functional criteria and insurance authorization. ?   ?Follow Up Recommendations ? Skilled nursing-short term rehab (<3 hours/day)  ?  ?Assistance Recommended at Discharge Frequent or constant Supervision/Assistance  ?Patient can return home with the following ? Two people to help with walking and/or transfers;Two people to help with bathing/dressing/bathroom;Assistance with cooking/housework;Assist for transportation;Help with stairs or ramp for entrance ?  ?Equipment Recommendations ? None recommended by OT;Other (comment) (defer to next venue of care)  ?  ?Recommendations for Other Services   ? ?  ?Precautions / Restrictions Precautions ?Precautions: Fall ?Restrictions ?Weight Bearing Restrictions: Yes ?LLE Weight Bearing: Weight bearing as tolerated  ? ? ?  ? ?Mobility Bed Mobility ?Overal bed mobility: Needs Assistance ?Bed Mobility: Supine to  Sit, Sit to Supine ?  ?  ?Supine to sit: Mod assist ?Sit to supine: Max assist ?  ?General bed mobility comments: for trunk elevation to EOB, and returning bil LE's into bed ?  ? ?Transfers ?  ?  ?  ?  ?  ?  ?  ?  ?  ?General transfer comment: deferred due to pt fatigue and tachycardia ?  ?  ?Balance Overall balance assessment: Needs assistance ?Sitting-balance support: Bilateral upper extremity supported, Feet supported ?Sitting balance-Leahy Scale: Poor ?Sitting balance - Comments: R lateral lean ?  ?Standing balance support: Bilateral upper extremity supported, During functional activity, Reliant on assistive device for balance ?Standing balance-Leahy Scale: Poor ?Standing balance comment: reliant on external support, ?  ?  ?  ?  ?  ?  ?  ?  ?  ?  ?  ?   ? ?ADL either performed or assessed with clinical judgement  ? ?ADL Overall ADL's : Needs assistance/impaired ?  ?  ?Grooming: Set up;Sitting ?Grooming Details (indicate cue type and reason): completes oral care sitting EOB ?  ?  ?  ?  ?  ?  ?  ?  ?  ?  ?  ?  ?  ?  ?  ?  ?  ? ?Extremity/Trunk Assessment Upper Extremity Assessment ?Upper Extremity Assessment: Generalized weakness ?  ?Lower Extremity Assessment ?Lower Extremity Assessment: Defer to PT evaluation ?  ?  ?  ? ?Vision   ?Vision Assessment?: No apparent visual deficits ?  ?Perception Perception ?Perception: Not tested ?  ?Praxis Praxis ?Praxis: Not tested ?  ? ?Cognition Arousal/Alertness: Awake/alert ?Behavior During Therapy: [facility] for tasks assessed/performed, Anxious ?  ?  ?  ?  ?  ?  ?  ?  ?  ?  ?  ?  ?  ?  ?  ?  ?  ?  ?  ?  ?   ?  Exercises Exercises: General Lower Extremity ?General Exercises - Lower Extremity ?Ankle Circles/Pumps: AROM, 10 reps, AAROM, Both, Seated ?Long Arc Quad: AROM, Both, 10 reps, Seated ?Hip Flexion/Marching: AROM, Both, 10 reps, Seated ? ?  ?Shoulder Instructions   ? ? ?  ?General Comments tachycardic, HR ranging from 90's - 130's with sitting EOB  ? ? ?Pertinent Vitals/  Pain       Pain Assessment ?Pain Assessment: Faces ?Pain Score: 5  ?Faces Pain Scale: Hurts little more ?Pain Location: L hip ?Pain Descriptors / Indicators: Discomfort, Grimacing ?Pain Intervention(s): Limited activity within patient's tolerance, Monitored during session ? ?Home Living   ?  ?  ?  ?  ?  ?  ?  ?  ?  ?  ?  ?  ?  ?  ?  ?  ?  ?  ? ?  ?Prior Functioning/Environment    ?  ?  ?  ?   ? ?Frequency ? Min 3X/week  ? ? ? ? ?  ?Progress Toward Goals ? ?OT Goals(current goals can now be found in the care plan section) ? Progress towards OT goals: Progressing toward goals ? ?Acute Rehab OT Goals ?Patient Stated Goal: control HR ?OT Goal Formulation: With patient ?Time For Goal Achievement: 08/22/21 ?Potential to Achieve Goals: Fair ?ADL Goals ?Pt Will Perform Upper Body Dressing: sitting;with min assist ?Pt Will Perform Lower Body Dressing: with mod assist;sitting/lateral leans;bed level ?Pt Will Transfer to Toilet: with mod assist;with +2 assist;stand pivot transfer;bedside commode  ?Plan Discharge plan remains appropriate;Frequency remains appropriate   ? ?Co-evaluation ? ? ?   ?  ?  ?  ?  ? ?  ?AM-PAC OT "6 Clicks" Daily Activity     ?Outcome Measure ? ? Help from another person eating meals?: None ?Help from another person taking care of personal grooming?: A Little ?Help from another person toileting, which includes using toliet, bedpan, or urinal?: A Lot ?Help from another person bathing (including washing, rinsing, drying)?: A Lot ?Help from another person to put on and taking off regular upper body clothing?: A Lot ?Help from another person to put on and taking off regular lower body clothing?: Total ?6 Click Score: 14 ? ?  ?End of Session Equipment Utilized During Treatment: Oxygen ? ?OT Visit Diagnosis: Other abnormalities of gait and mobility (R26.89);Muscle weakness (generalized) (M62.81);History of falling (Z91.81) ?  ?Activity Tolerance Patient limited by pain ?  ?Patient Left in bed;with call  bell/phone within reach;with bed alarm set ?  ?Nurse Communication Mobility status ?  ? ?   ? ?Time: 1245-8099 ?OT Time Calculation (min): 30 min ? ?Charges: OT General Charges ?$OT Visit: 1 Visit ?OT Treatments ?$Self Care/Home Management : 8-22 mins ?$Therapeutic Activity: 8-22 mins ? ?Lynnda Child, OTD, OTR/L ?Acute Rehab ?[phone] ? ? ?Kaylyn Lim ?08/10/2021, 11:54 AM ?

## 2021-08-10 NOTE — Progress Notes (Signed)
Physical Therapy Treatment ?Patient Details ?Name: [name] ?MRN: [id number] ?DOB: 1935-12-18 ?Today's Date: 08/10/2021 ? ? ?History of Present Illness The pt is an 86 yo female presenting 3/31 with L hip fx after a fall. She is now s/p IM nailing of L hip on 4/1. PMH includes: CHF, DDD< DM II, GERD, HOH, HTN, and previous fall with ORIF of L hip. ? ?  ?PT Comments  ? ? Pt received supine and agreeable to session with focus on progression of transfers and ambulation. Pt able to come to sitting EOB with min assist for LE management and trunk elevation. Pt requiring min assist from elevated EOB to come to standing with RW support. Once standing pt needing repeated cues to push through hands and not rest elbows on RW. Pt able to slowly, with very small incremental movements, advance feet toward recliner. Pt unable to lift BLE to advance gait. Pt demonstrating poor eccentric control to sit, despite cueing. Current plan remains appropriate to address deficits and maximize functional independence and decrease caregiver burden. Pt continues to benefit from skilled PT services to progress toward functional mobility goals.  ?  ?Recommendations for follow up therapy are one component of a multi-disciplinary discharge planning process, led by the attending physician.  Recommendations may be updated based on patient status, additional functional criteria and insurance authorization. ? ?Follow Up Recommendations ? Skilled nursing-short term rehab (<3 hours/day) ?  ?  ?Assistance Recommended at Discharge Frequent or constant Supervision/Assistance  ?Patient can return home with the following Two people to help with walking and/or transfers;Two people to help with bathing/dressing/bathroom;Direct supervision/assist for medications management;Assist for transportation;Help with stairs or ramp for entrance ?  ?Equipment Recommendations ? Rolling walker (2 wheels);Wheelchair (measurements PT);Wheelchair cushion (measurements PT)  ?   ?Recommendations for Other Services OT consult ? ? ?  ?Precautions / Restrictions Precautions ?Precautions: Fall ?Restrictions ?Weight Bearing Restrictions: Yes ?LLE Weight Bearing: Weight bearing as tolerated  ?  ? ?Mobility ? Bed Mobility ?Overal bed mobility: Needs Assistance ?Bed Mobility: Supine to Sit ?  ?  ?Supine to sit: Min assist ?  ?  ?General bed mobility comments: for trunk elevation and to bring BLE off of bed ?  ? ?Transfers ?Overall transfer level: Needs assistance ?Equipment used: Rolling walker (2 wheels) ?Transfers: Sit to/from Stand, Bed to chair/wheelchair/BSC ?Sit to Stand: Min assist, From elevated surface ?Stand pivot transfers: Mod assist, Max assist ?  ?  ?  ?  ?General transfer comment: mod-max A for stand pivot transfer with RW, pt unable to advance feet depsite heavy multimodal cues ?  ? ?Ambulation/Gait ?  ?  ?  ?  ?  ?  ?  ?General Gait Details: unable ? ? ?Stairs ?  ?  ?  ?  ?  ? ? ?Wheelchair Mobility ?  ? ?Modified Rankin (Stroke Patients Only) ?  ? ? ?  ?Balance Overall balance assessment: Needs assistance ?Sitting-balance support: Bilateral upper extremity supported, Feet supported ?Sitting balance-Leahy Scale: Poor ?Sitting balance - Comments: R lateral lean ?  ?Standing balance support: Bilateral upper extremity supported, During functional activity, Reliant on assistive device for balance ?Standing balance-Leahy Scale: Poor ?Standing balance comment: reliant on external support, ?  ?  ?  ?  ?  ?  ?  ?  ?  ?  ?  ?  ? ?  ?Cognition Arousal/Alertness: Awake/alert ?Behavior During Therapy: [facility] for tasks assessed/performed, Anxious ?  ?  ?  ?  ?  ?  ?  ?  ?  ?  ?  ?  ?  ?  ?  ?  ?  ?  ?  ?  ? ?  ?  Exercises General Exercises - Lower Extremity ?Ankle Circles/Pumps: AROM, 10 reps, AAROM, Both, Seated ?Heel Slides: AROM, AAROM, Left, 10 reps ?Hip ABduction/ADduction: AROM, AAROM, Left, 10 reps ?Straight Leg Raises: AROM, AAROM, Left, 10 reps ? ?  ?General Comments General comments  (skin integrity, edema, etc.): tachycardic, HR ranging from 90's - 130's with sitting EOB ?  ?  ? ?Pertinent Vitals/Pain Pain Assessment ?Pain Assessment: Faces ?Faces Pain Scale: Hurts little more ?Pain Location: L hip ?Pain Descriptors / Indicators: Discomfort, Grimacing ?Pain Intervention(s): Limited activity within patient's tolerance, Monitored during session, Repositioned  ? ? ?Home Living   ?  ?  ?  ?  ?  ?  ?  ?  ?  ?   ?  ?Prior Function    ?  ?  ?   ? ?PT Goals (current goals can now be found in the care plan section) Acute Rehab PT Goals ?PT Goal Formulation: With patient ?Time For Goal Achievement: 08/21/21 ? ?  ?Frequency ? ? ? Min 3X/week ? ? ? ?  ?PT Plan    ? ? ?Co-evaluation   ?  ?  ?  ?  ? ?  ?AM-PAC PT "6 Clicks" Mobility   ?Outcome Measure ? Help needed turning from your back to your side while in a flat bed without using bedrails?: A Lot ?Help needed moving from lying on your back to sitting on the side of a flat bed without using bedrails?: A Lot ?Help needed moving to and from a bed to a chair (including a wheelchair)?: A Lot ?Help needed standing up from a chair using your arms (e.g., wheelchair or bedside chair)?: A Little ?Help needed to walk in hospital room?: Total ?Help needed climbing 3-5 steps with a railing? : Total ?6 Click Score: 11 ? ?  ?End of Session Equipment Utilized During Treatment: Gait belt ?Activity Tolerance: Patient tolerated treatment well ?Patient left: in chair;with call bell/phone within reach;with family/visitor present ?Nurse Communication: Mobility status ?PT Visit Diagnosis: Unsteadiness on feet (R26.81);Other abnormalities of gait and mobility (R26.89);History of falling (Z91.81);Pain ?Pain - Right/Left: Left ?Pain - part of body: Hip;Leg ?  ? ? ?Time: 4765-4650 ?PT Time Calculation (min) (ACUTE ONLY): 23 min ? ?Charges:  $Therapeutic Exercise: 8-22 mins ?$Therapeutic Activity: 8-22 mins          ?          ? ?Audry Riles. PTA ?Acute Rehabilitation Services ?Office:  [phone] ? ? ?Betsey Holiday Marvie Calender ?08/10/2021, 12:21 PM ? ?

## 2021-08-10 NOTE — Discharge Summary (Signed)
? ?Physician Discharge Summary  ?[name] ERX:[id number] DOB: February 09, 1936 DOA: 08/05/2021 ? ?PCP: Janora Norlander, DO ? ?Admit date: 08/05/2021 ?Discharge date: 08/10/2021 ? ?Admitted From: Home ?Discharge disposition: SNF ? ?Recommendations at discharge:  ?Needs outpatient follow-up arranged by skilled facility with Dr. Malena Catholic 4/14-please arrange transport to the appointment ?Metoprolol has been added, amlodipine dose has been reduced  ?Will need basic metabolic panel in 2 to 3 days given mild azotemia secondary to blood loss anemia and prior to admission meds ?May require oxygen short-term given her kyphoscoliosis and altered anatomy and also postop state. Will need 3 L at all times temporarily on discharge to SNF ?Consider outpatient Prolia-vitamin D levels were good this hospital stay ? ? ?Brief narrative: ?[name] is a 86 y.o. female with PMH significant for DM2, HTN, CHF, GERD, arthritis, osteoporosis, glaucoma and previous falls leading to fractures. ?On 3/31, patient had a mechanical fall while bringing the trash can.  She landed on her left side with 10 out of 10 hip pain. ? ?Left hip x-ray showed acute markedly comminuted and moderately displaced intertrochanteric fracture and an additional nondisplaced acute superior left pubic ramus fracture. ? ?Admitted to hospitalist service ?Orthopedics consulted ?4/1, underwent left hip intramedullary nailing by Dr. Tamera Punt. ? ?Subjective: ?Patient was seen and examined this morning.  Pleasant elderly Caucasian female.  Lying in bed.  Not in distress.  She says she has reproducible chest pain because of arthritis and since EMS picked her up. ?Heart rate better after metoprolol was increased. ?Chart reviewed ?Mildly tachycardic intermittently in last 24 hours.  Blood pressure over 150 this morning, ? ?Principal Problem: ?  Intertrochanteric fracture of left femur, closed, initial encounter ([place]) ?Active Problems: ?  Hypertension associated with  diabetes ([place]) ?  Hyperlipidemia associated with type 2 diabetes mellitus ([place]) ?  GERD (gastroesophageal reflux disease) ?  Diabetes mellitus type II, controlled ([place]) ?  Leukocytosis ?  ? ?Assessment and Plan: ?Left hip fracture  ?-Secondary to a fall.  Imaging showed an acute marked comminuted moderate displaced intertrochanteric left hip fracture ?-4/1, underwent left hip intramedullary nailing by Dr. Tamera Punt ?-Per orthopedics, DVT prophylaxis with aspirin 81 mg daily ?-Pain control with Percocet and Flexeril as needed. ?-PT evaluation was obtained.  SNF recommended. ? ?Type 2 diabetes mellitus ?-A1c 7.7 on 08/03/2021 ?-Home meds include Janumet ?-Currently continued on the same.  Target A1c less than 8 for her age. ?Recent Labs  ?Lab 08/09/21 ?0751 08/09/21 ?1206 08/09/21 ?1706 08/09/21 ?2029 08/10/21 ?0802  ?GLUCAP 140* 139* 166* 99 241*  ? ?Postoperative anemia  ?-Baseline hemoglobin 12.8 from a year ago. ?-Postoperatively, patient had a drop in hemoglobin as low as 6.7.  1 unit of PRBC transfused on 4/3 ?-No other evidence of active bleeding.  Hemoglobin 8.5 on 4/4 ?-Currently on low-dose aspirin for DVT prophylaxis. ?Recent Labs  ?  08/23/20 ?1439 08/05/21 ?1817 08/07/21 ?9509 08/08/21 ?0147 08/09/21 ?0135  ?HGB 12.8 10.4* 8.1* 6.7* 8.5*  ?MCV 84 84.2 83.9 84.0 85.4  ? ?Sinus tachycardia ?-Patient has intermittent sinus tachycardia for last 24 hours.  Metoprolol 25 mg twice daily was started on 4/4. ? ?Chronic diastolic CHF  ?essential hypertension ?-PTA, patient was on amlodipine 10 mg daily, lisinopril 40 mg daily, torsemide 40 mg daily ?-Last echo from March 2022 with EF 70 to 75%, moderate LVH, severe elevated pulm artery systolic pressure with RVSP at 86. ?-Currently continued on lisinopril, amlodipine and added metoprolol.  Because of coexisting CHF, I would  resume torsemide at discharge.  Reduce amlodipine to 5 mg daily to avoid hypotension. ? ?Hyperlipidemia ?-Lipitor 20 mg  daily, ? ?GERD ?-PPI ? ?Osteoporosis ?-Vitamin D level 35--given 1 dose of 50,000 units vitamin D 4/1 ?-Outpatient osteoporosis management as per PCP- ? ?Wounds:  ?- ?Incision (Closed) 05/24/15 Hand Right (Active)  ?Date First Assessed/Time First Assessed: 05/24/15 0917   Location: Hand  Location Orientation: Right  ?  ?Assessments 05/24/2015 11:00 AM 05/24/2015  3:30 PM  ?Dressing Type Compression wrap Compression wrap  ?Dressing Clean;Dry;Intact Clean;Dry;Intact  ?Drainage Amount None None  ?Treatment -- Other (Comment)  ?   ?No Linked orders to display  ?   ?Incision (Closed) 05/24/15 Hand Left (Active)  ?Date First Assessed/Time First Assessed: 05/24/15 0917   Location: Hand  Location Orientation: Left  ?  ?Assessments 05/24/2015 11:00 AM 05/24/2015  3:30 PM  ?Dressing Type Compression wrap Compression wrap  ?Dressing Clean;Dry;Intact Clean;Dry;Intact  ?Drainage Amount None None  ?Treatment -- Other (Comment)  ?   ?No Linked orders to display  ?   ?Incision (Closed) 08/06/21 Hip Left (Active)  ?Date First Assessed/Time First Assessed: 08/06/21 1612   Location: Hip  Location Orientation: Left  ?  ?Assessments 08/06/2021  4:37 PM 08/09/2021  7:53 AM  ?Dressing Type Foam - Lift dressing to assess site every shift Foam - Lift dressing to assess site every shift  ?Dressing Clean, Dry, Intact Clean, Dry, Intact  ?Site / Wound Assessment -- Clean;Dry  ?Margins -- Attached edges (approximated)  ?Drainage Amount None None  ?Treatment -- Ice applied  ?   ?No Linked orders to display  ? ? ?Discharge Exam:  ? ?Vitals:  ? 08/09/21 1855 08/09/21 2053 08/09/21 2200 08/10/21 0803  ?BP: (!) 126/55 (!) 136/49  (!) 158/55  ?Pulse: (!) 120  60 (!) 104  ?Resp: '19 13 18 '$ (!) 23  ?Temp:  98 ?F (36.7 ?C)  98.1 ?F (36.7 ?C)  ?TempSrc:  Oral  Oral  ?SpO2: 98%  100% 100%  ?Weight:      ?Height:      ? ? ?Body mass index is 22.85 kg/m?.  ?General exam: Pleasant, elderly Caucasian female.  Not in physical distress ?Skin: No rashes, lesions or  ulcers. ?HEENT: Atraumatic, normocephalic, no obvious bleeding ?Lungs: Clear to auscultation bilaterally.  Reproducible pain in anterior chest wall ?CVS: Regular rate and rhythm, no murmur ?GI/Abd soft, nontender, nondistended, bowel sound present ?CNS: Alert, awake, oriented x3 ?Psychiatry: Mood appropriate ?Extremities: No pedal edema, no calf tenderness ? ?Follow ups:  ? ? Follow-up Information   ? ? [place], Museum/gallery conservator, [employer]. Schedule an appointment as soon as possible for a visit on 08/19/2021.   ?Specialty: Orthopedic Surgery ?Contact information: ?[address] ?[place] [address] ?[phone] ? ? ?  ?  ? ? [name] M, DO Follow up.   ?Specialty: Family Medicine ?Contact information: ?[address] ?[place] [address] ?[phone] ? ? ?  ?  ? ? Josue Hector, MD .   ?Specialty: Cardiology ?Contact information: ?[address] ?[address] ?[place] [address] ?[phone] ? ? ?  ?  ? ?  ?  ? ?  ? ? ?Discharge Instructions:  ? ?Discharge Instructions   ? ? Call MD for:  difficulty breathing, headache or visual disturbances   Complete by: As directed ?  ? Call MD for:  extreme fatigue   Complete by: As directed ?  ? Call MD for:  hives   Complete by:  As directed ?  ? Call MD for:  persistant dizziness or light-headedness   Complete by: As directed ?  ? Call MD for:  persistant nausea and vomiting   Complete by: As directed ?  ? Call MD for:  severe uncontrolled pain   Complete by: As directed ?  ? Call MD for:  temperature >100.4   Complete by: As directed ?  ? Diet - low sodium heart healthy   Complete by: As directed ?  ? Diet general   Complete by: As directed ?  ? Discharge instructions   Complete by: As directed ?  ? Recommendations at discharge:  ?? Needs outpatient follow-up arranged by skilled facility with Dr. Malena Catholic 4/14-please arrange transport to the appointment ?? Metoprolol has been added, amlodipine dose has been reduced  ?? Will need basic metabolic panel in  2 to 3 days given mild azotemia secondary to blood loss anemia and prior to admission meds ?? May require oxygen short-term given her kyphoscoliosis and altered anatomy and also postop state. Will need 3 L at all times temporarily on d

## 2021-08-10 NOTE — TOC Transition Note (Signed)
Transition of Care (TOC) - CM/SW Discharge Note ? ? ?Patient Details  ?Name: [name] ?MRN: [id number] ?Date of Birth: May 26, 1935 ? ?Transition of Care [facility]) CM/SW Contact:  ?Joanne Chars, LCSW ?Phone Number: ?08/10/2021, 11:22 AM ? ? ?Clinical Narrative:   Pt discharging to [place].  RN call [phone] for report.   ? ? ? ?Final next level of care: [place] ?Barriers to Discharge: Barriers Resolved ? ? ?Patient Goals and CMS Choice ?Patient states their goals for this hospitalization and ongoing recovery are:: walking ?CMS Medicare.gov Compare Post Acute Care list provided to:: Patient ?Choice offered to / list presented to : Patient ? ?Discharge Placement ?  ?           ?Patient chooses bed at:  [facility]) ?Patient to be transferred to facility by: [facility] ?Name of family member notified: sister Thayer Headings ?Patient and family notified of of transfer: 08/10/21 ? ?Discharge Plan and Services ?In-house Referral: Clinical Social Work ?  ?Post Acute Care Choice: [place]          ?  ?  ?  ?  ?  ?  ?  ?  ?  ?  ? ?Social Determinants of Health (SDOH) Interventions ?  ? ? ?Readmission Risk Interventions ? ?  07/11/2020  ?  3:49 PM  ?Readmission Risk Prevention Plan  ?Transportation Screening Complete  ?PCP or Specialist Appt within 3-5 Days Complete  ?[place] or Home Care Consult Complete  ?Social Work Consult for [place] Planning/Counseling Complete  ?Palliative Care Screening Not Applicable  ?Medication Review Press photographer) Complete  ? ? ? ? ? ?

## 2021-08-11 ENCOUNTER — Encounter: Payer: Self-pay | Admitting: Adult Health

## 2021-08-11 ENCOUNTER — Non-Acute Institutional Stay (SKILLED_NURSING_FACILITY): Payer: Medicare Other | Admitting: Adult Health

## 2021-08-11 DIAGNOSIS — M8000XS Age-related osteoporosis with current pathological fracture, unspecified site, sequela: Secondary | ICD-10-CM

## 2021-08-11 DIAGNOSIS — R000 Tachycardia, unspecified: Secondary | ICD-10-CM | POA: Diagnosis not present

## 2021-08-11 DIAGNOSIS — D62 Acute posthemorrhagic anemia: Secondary | ICD-10-CM

## 2021-08-11 DIAGNOSIS — E782 Mixed hyperlipidemia: Secondary | ICD-10-CM | POA: Diagnosis not present

## 2021-08-11 DIAGNOSIS — N1831 Chronic kidney disease, stage 3a: Secondary | ICD-10-CM | POA: Diagnosis not present

## 2021-08-11 DIAGNOSIS — E1122 Type 2 diabetes mellitus with diabetic chronic kidney disease: Secondary | ICD-10-CM

## 2021-08-11 DIAGNOSIS — S32592S Other specified fracture of left pubis, sequela: Secondary | ICD-10-CM

## 2021-08-11 DIAGNOSIS — S72142S Displaced intertrochanteric fracture of left femur, sequela: Secondary | ICD-10-CM

## 2021-08-11 DIAGNOSIS — I5032 Chronic diastolic (congestive) heart failure: Secondary | ICD-10-CM | POA: Diagnosis not present

## 2021-08-11 DIAGNOSIS — K219 Gastro-esophageal reflux disease without esophagitis: Secondary | ICD-10-CM

## 2021-08-11 NOTE — Progress Notes (Signed)
? ?Location:  [facility] ?Nursing Home Room Number: 867 A ?Place of Service:  SNF (31) ?Provider:  Durenda Age, DNP, FNP-BC ? ?Patient Care Team: ?Janora Norlander, DO as PCP - General (Family Medicine) ?Josue Hector, MD as PCP - Cardiology (Cardiology) ? ?Extended Emergency Contact Information ?Primary Emergency Contact: Hopper,Hazel ?Address: [address] ?         [place], [place] [address] [place] ?Home Phone: [phone] ?Mobile Phone: [phone] ?Relation: Sister ?Secondary Emergency Contact: Pavy,Janice ?Mobile Phone: [phone] ?Relation: Sister ? ?Code Status:  DNR ? ?Goals of care: Advanced Directive information ? ?  08/06/2021  ?  9:29 PM  ?Advanced Directives  ?Would patient like information on creating a medical advance directive? No - Patient declined  ? ? ? ?Chief Complaint  ?Patient presents with  ? Hospitalization Follow-up  ?  Admitted to [facility] on 08/10/21 post hospital admission 08/05/21 to 08/10/21  ? ? ?HPI:  ?Pt is a 86 y.o. female who was admitted to [facility] on 08/10/21 post hospital admission 08/05/21 to 08/10/21. She has a PMH of diabetes mellitus type 2, hypertension, CHF, GERD, arthritis, osteoporosis, glaucoma and previous falls.  She presented to ED post mechanical fall on 3/31 while bringing the trash can then tripped and fell landing on her left side. Left hip x-ray showed markedly comminuted and moderately displaced intertrochanteric fracture and an additional nondisplaced acute superior left pubic ramus fracture. Orthopedics was consulted and performed left hip intramedullary nailing on 4/1. Hospitalization was complicated by anemia post operatively, hgb dropped to 6.7. She was transfused 1 unit PRBC on 4/03. She had sinus tachycardia for which she was started on Metoprolol 25 mg BID on 4/04.  ? ?She was seen in the room today with sister at bedside. ? ? ?Past Medical History:  ?Diagnosis Date  ?  Arthritis   ? OA  ? CHF (congestive heart failure) ([place])   ? DDD (degenerative disc disease), lumbosacral   ? Diabetes mellitus without complication ([place])   ? Distal radius fracture   ? bilateral  ? GERD (gastroesophageal reflux disease)   ? Glaucoma   ? HOH (hard of hearing)   ? left ear  ? Hypertension   ? PONV (postoperative nausea and vomiting)   ? ?Past Surgical History:  ?Procedure Laterality Date  ? ABDOMINAL HYSTERECTOMY    ? BREAST BIOPSY    ? BREAST EXCISIONAL BIOPSY Left   ? benign  ? BREAST SURGERY    ? left milk duct   ? EYE SURGERY    ? cataract  ? FRACTURE SURGERY Left   ? hip fracture  ? HIP FRACTURE SURGERY Right   ? INTRAMEDULLARY (IM) NAIL INTERTROCHANTERIC Left 08/06/2021  ? Procedure: INTRAMEDULLARY (IM) NAIL INTERTROCHANTRIC;  Surgeon: Tania Ade, MD;  Location: [place];  Service: Orthopedics;  Laterality: Left;  ? OPEN REDUCTION INTERNAL FIXATION (ORIF) DISTAL RADIAL FRACTURE Bilateral 05/24/2015  ? Procedure: OPEN REDUCTION INTERNAL FIXATION (ORIF) BILATERAL DISTAL RADIAL FRACTURE VOLAR PLATE;  Surgeon: Daryll Brod, MD;  Location: [place];  Service: Orthopedics;  Laterality: Bilateral;  ? ? ?Allergies  ?Allergen Reactions  ? Codeine Nausea Only  ? ? ?Outpatient Encounter Medications as of 08/11/2021  ?Medication Sig  ? albuterol (VENTOLIN HFA) 108 (90 Base) MCG/ACT inhaler Inhale 2 puffs into the lungs every 6 (six) hours as needed for wheezing or shortness of breath.  ? amLODipine (NORVASC) 5 MG tablet Take 1 tablet (5 mg  total) by mouth daily.  ? aspirin 81 MG tablet Take 81 mg by mouth daily.  ? atorvastatin (LIPITOR) 20 MG tablet Take 1 tablet (20 mg total) by mouth daily.  ? calcium-vitamin D (OSCAL WITH D) 500-200 MG-UNIT tablet Take 1 tablet by mouth.  ? Cetirizine HCl (ZYRTEC ALLERGY) 10 MG CAPS Take 1 capsule by mouth daily.  ? cyclobenzaprine (FLEXERIL) 5 MG tablet Take 1 tablet (5 mg total) by mouth 3 (three) times daily as needed for up to 5 days for muscle  spasms.  ? DEXILANT 60 MG capsule Take 1 capsule by mouth daily.  ? Elastic Bandages & Supports (LUMBAR BACK BRACE/SUPPORT PAD) MISC Velcro closing back brace.  Use as needed for back pain.  Use no more than 1 time per week.  ? fluticasone (FLONASE) 50 MCG/ACT nasal spray Place 2 sprays into both nostrils daily.  ? latanoprost (XALATAN) 0.005 % ophthalmic solution 1 drop at bedtime.  ? lisinopril (ZESTRIL) 40 MG tablet Take 1 tablet by mouth once daily  ? Magnesium Oxide 400 (240 Mg) MG TABS Take 1 tablet (400 mg total) by mouth daily.  ? metoprolol tartrate (LOPRESSOR) 25 MG tablet Take 1 tablet (25 mg total) by mouth 2 (two) times daily.  ? multivitamin-iron-minerals-folic acid (CENTRUM) chewable tablet Chew 1 tablet by mouth daily.  ? oxyCODONE (OXY IR/ROXICODONE) 5 MG immediate release tablet Take 1 tablet (5 mg total) by mouth every 6 (six) hours as needed for moderate pain.  ? senna (SENOKOT) 8.6 MG TABS tablet Take 1 tablet (8.6 mg total) by mouth daily.  ? sitaGLIPtin-metformin (JANUMET) 50-1000 MG tablet Take 0.5 tablets by mouth 2 (two) times daily with a meal. (Patient taking differently: Take 1 tablet by mouth 2 (two) times daily with a meal.)  ? timolol (TIMOPTIC) 0.5 % ophthalmic solution Place 1 drop into both eyes daily.  ? torsemide (DEMADEX) 20 MG tablet Take 2 tablets (40 mg total) by mouth daily.  ? ?No facility-administered encounter medications on file as of 08/11/2021.  ? ? ?Review of Systems  ?Constitutional:  Negative for appetite change, chills, fatigue and fever.  ?HENT:  Negative for congestion, hearing loss, rhinorrhea and sore throat.   ?Eyes: Negative.   ?Respiratory:  Negative for cough, shortness of breath and wheezing.   ?Cardiovascular:  Positive for leg swelling. Negative for chest pain and palpitations.  ?Gastrointestinal:  Negative for abdominal pain, constipation, diarrhea, nausea and vomiting.  ?Genitourinary:  Negative for dysuria.  ?Musculoskeletal:  Negative for arthralgias,  back pain and myalgias.  ?Skin:  Negative for color change, rash and wound.  ?Neurological:  Negative for dizziness, weakness and headaches.  ?Psychiatric/Behavioral:  Negative for behavioral problems. The patient is not nervous/anxious.    ? ? ? ?Immunization History  ?Administered Date(s) Administered  ? Fluad Quad(high Dose 65+) 02/12/2019  ? Influenza Split 02/07/2016, 02/08/2017  ? Influenza, High Dose Seasonal PF 01/12/2015, 02/18/2018  ? Influenza,inj,Quad PF,6+ Mos 02/20/2018  ? Influenza,trivalent, recombinat, inj, PF 02/17/2014, 02/08/2017  ? Influenza-Unspecified 03/11/2020  ? Moderna Sars-Covid-2 Vaccination 07/03/2019, 07/29/2019  ? Pneumococcal Conjugate-13 01/12/2015  ? Pneumococcal Polysaccharide-23 06/12/2016  ? Tdap 06/11/2003, 06/12/2003, 09/17/2017  ? Zoster, Live 01/12/2015  ? ?Pertinent  Health Maintenance Due  ?Topic Date Due  ? OPHTHALMOLOGY EXAM  Never done  ? INFLUENZA VACCINE  12/06/2021  ? HEMOGLOBIN A1C  02/03/2022  ? FOOT EXAM  04/13/2022  ? DEXA SCAN  Completed  ? ? ?  08/08/2021  ?  8:00 AM 08/08/2021  ?  7:48 PM 08/09/2021  ?  7:40 AM 08/09/2021  ?  8:00 PM 08/10/2021  ? 10:00 AM  ?Fall Risk  ?Patient Fall Risk Level High fall risk High fall risk High fall risk High fall risk High fall risk  ? ? ? ?Vitals:  ? 08/11/21 1400  ?BP: (!) 153/68  ?Pulse: 88  ?Resp: 18  ?Temp: (!) 97.2 ?F (36.2 ?C)  ?Weight: 117 lb (53.1 kg)  ?Height: 5' (1.524 m)  ? ?Body mass index is 22.85 kg/m?. ? ?Physical Exam ?Constitutional:   ?   Appearance: Normal appearance.  ?HENT:  ?   Head: Normocephalic and atraumatic.  ?   Nose: Nose normal.  ?   Mouth/Throat:  ?   Mouth: Mucous membranes are moist.  ?Eyes:  ?   Conjunctiva/sclera: Conjunctivae normal.  ?Cardiovascular:  ?   Rate and Rhythm: Regular rhythm. Tachycardia present.  ?Pulmonary:  ?   Effort: Pulmonary effort is normal.  ?   Breath sounds: Normal breath sounds.  ?Abdominal:  ?   General: Bowel sounds are normal.  ?   Palpations: Abdomen is soft.   ?Musculoskeletal:     ?   General: Swelling present.  ?   Cervical back: Normal range of motion.  ?   Left lower leg: Edema present.  ?   Comments: LLE 2+edema.  ?Skin: ?   General: Skin is warm and dry.  ?Neurologica

## 2021-08-15 ENCOUNTER — Non-Acute Institutional Stay (SKILLED_NURSING_FACILITY): Payer: Medicare Other | Admitting: Internal Medicine

## 2021-08-15 ENCOUNTER — Encounter: Payer: Self-pay | Admitting: Adult Health

## 2021-08-15 ENCOUNTER — Other Ambulatory Visit: Payer: Self-pay | Admitting: Adult Health

## 2021-08-15 ENCOUNTER — Encounter: Payer: Self-pay | Admitting: Internal Medicine

## 2021-08-15 DIAGNOSIS — D72829 Elevated white blood cell count, unspecified: Secondary | ICD-10-CM

## 2021-08-15 DIAGNOSIS — R079 Chest pain, unspecified: Secondary | ICD-10-CM

## 2021-08-15 DIAGNOSIS — S72142G Displaced intertrochanteric fracture of left femur, subsequent encounter for closed fracture with delayed healing: Secondary | ICD-10-CM

## 2021-08-15 DIAGNOSIS — E1165 Type 2 diabetes mellitus with hyperglycemia: Secondary | ICD-10-CM | POA: Diagnosis not present

## 2021-08-15 DIAGNOSIS — R0789 Other chest pain: Secondary | ICD-10-CM

## 2021-08-15 DIAGNOSIS — D62 Acute posthemorrhagic anemia: Secondary | ICD-10-CM | POA: Diagnosis not present

## 2021-08-15 DIAGNOSIS — S72142S Displaced intertrochanteric fracture of left femur, sequela: Secondary | ICD-10-CM

## 2021-08-15 MED ORDER — TRAMADOL HCL 50 MG PO TABS
50.0000 mg | ORAL_TABLET | Freq: Four times a day (QID) | ORAL | 0 refills | Status: AC | PRN
Start: 2021-08-15 — End: ?

## 2021-08-15 NOTE — Assessment & Plan Note (Addendum)
Glucoses while hospitalized ranged from a low of 109 up to high of 333.  A1c of 7.7% indicates adequate control on Janumet.  Goal would be less than 8 based on age and comorbidities.  CKD stage II present with GFR of 84.39 and creatinine of 0.6. ?

## 2021-08-15 NOTE — Assessment & Plan Note (Addendum)
See 08/15/21 chest wall pain elicited by deep palpation in the upper low thoracic areas bilaterally.  PA and lateral chest x-ray 08/05/2021 revealed old posterior rib fractures as well as multiple thoracic and upper lumbar fractures. EKG 3/31 revealed sinus arrhythmia with PACs & PVCs. Troponin 10 (< 18). ?She states that the opioids nauseate her; she had been taking tramadol at home and this change will be made. ?

## 2021-08-15 NOTE — Progress Notes (Signed)
? ?NURSING HOME LOCATION:  [facility] ?ROOM NUMBER:  227 A ? ?CODE STATUS:  DNR ? ?PCP:  Adam Phenix MD ? ?This is a comprehensive admission note to this SNFperformed on this date less than 30 days from date of admission. ?Included are preadmission medical/surgical history; reconciled medication list; family history; social history and comprehensive review of systems.  ?Corrections and additions to the records were documented. Comprehensive physical exam was also performed. Additionally a clinical summary was entered for each active diagnosis pertinent to this admission in the Problem List to enhance continuity of care. ? ?HPI: She was hospitalized 3/31 - 08/10/2021 with acute markedly comminuted moderately displaced intertrochanteric fracture of the left hip and nondisplaced acute superior left pubic ramus fracture sustained in a mechanical fall.  She had fallen while attempting to move a trash can.there was no cardiac or neuro prodrome. ?Dr. Tamera Punt completed left hip intramedullary nailing 4/1.  DVT prophylaxis was 81 mg of aspirin daily.  Vitamin D level was 35; she received 1 dose of 50,000 IUs of vitamin D on 4/1. ?Previous baseline hemoglobin 12.8; postop there was progressive anemia with a nadir hemoglobin as low as 6.7.  She received 1 unit of packed cells 4/3.  Predischarge hemoglobin was 8.5. ?She did exhibit mild tachycardia and systolic blood pressure elevation intermittently.  Beta-blocker dose was adjusted. ?Janumet was continued for her diabetes; glucoses ranged from a low of 109 up to a high of 333.  Adequate control was documented by an A1c of 7.7%. ?PT/OT recommended SNF placement for rehab. ? ?Past medical and surgical history: Includes essential hypertension, dyslipidemia, GERD, chronic diastolic congestive heart failure, and diabetes with CKD stage II. ?Surgery and procedures include TAH, breast biopsy, and prior history of ORIF of distal radial fracture as well  as prior right hip fracture surgery. ? ?Social history: Nondrinker; non smoker. ? ?Family history: Noncontributory due to advanced age. ?  ?Review of systems: Her chief complaint is recurrent chest pain.  She states she had this while in the hospital. EKG 3/31 revealed sinus arrhythmia with PACs & PVCs.Troponin was 10 on 4/4. It started again yesterday but resolved last night.  It recurred this morning and is worse.  She localizes this to the right upper chest with some radiation into the right breast area.  It is worse when PT/OT uses a support belt around her chest.  She describes it as throbbing and her heart as racing.  She states the pain can last hours.  The pain is worse with coughing or rotation of the chest.  PA and lateral chest x-ray 3/31 revealed old posterior rib fractures as well as multiple thoracic and upper lumbar fractures. ?She describes chronic anorexia and "upset stomach" due to gas and dyspepsia. ?She does validate that the fracture was sustained in a mechanical fall when she slipped on wet grass. ? ?Constitutional: No fever, significant weight change  ?Eyes: No redness, discharge, pain, vision change ?ENT/mouth: No nasal congestion, purulent discharge, earache, change in hearing, sore throat  ?Cardiovascular: No paroxysmal nocturnal dyspnea, claudication, edema  ?Respiratory: No sputum production, hemoptysis, DOE, significant snoring, apnea Gastrointestinal: No dysphagia, abdominal pain, nausea /vomiting, rectal bleeding, melena, change in bowels ?Genitourinary: No dysuria, hematuria, pyuria, incontinence, nocturia ?Dermatologic: No rash, pruritus, change in appearance of skin ?Neurologic: No dizziness, headache, syncope, seizures, numbness, tingling ?Psychiatric: No significant insomnia ?Endocrine: No change in hair/skin/nails, excessive thirst, excessive hunger, excessive urination  ?Hematologic/lymphatic: No significant bruising, lymphadenopathy, abnormal bleeding ?Allergy/immunology: No  itchy/watery eyes, significant sneezing, urticaria, angioedema ? ?Physical exam:  ?Pertinent or positive findings: She appears her age and somewhat chronically ill.  She appears anxious.  Complete dentures are present.  First and second heart sounds are accentuated.  Heart rhythm is irregular.  Breath sounds are decreased but bases are clear.  Her chest pain can be elicited by deep palpation over the upper anterior thorax bilaterally.  Pain was also initiated by her attempt to sit up.  There is accentuated lordotic curve of the thoracic spine.  Abdomen is protuberant.  Dorsalis pedis pulses are stronger than posterior tibial pulses.  She has 1/2+ edema at the sock line.  Isolated DIP changes are present on the hands.  Interosseous wasting of the hands is present.  She has minor bruising of the forearms.  The left hip area is surgically dressed. ? ?General appearance: no acute distress, increased work of breathing is present.   ?Lymphatic: No lymphadenopathy about the head, neck, axilla. ?Eyes: No conjunctival inflammation or lid edema is present. There is no scleral icterus. ?Ears:  External ear exam shows no significant lesions or deformities.   ?Nose:  External nasal examination shows no deformity or inflammation. Nasal mucosa are pink and moist without lesions, exudates ?Oral exam:There is no oropharyngeal erythema or exudate. ?Neck:  No thyromegaly, masses, tenderness noted.    ?Heart:  No gallop, murmur, click, rub.  ?Lungs: without wheezes, rhonchi, rales, rubs. ?Abdomen: Bowel sounds are normal.  Abdomen is soft and nontender with no organomegaly, hernias, masses. ?GU: Deferred  ?Extremities:  No cyanosis, clubbing ?Neurologic exam: Balance, Rhomberg, finger to nose testing could not be completed due to clinical state ?Skin: Warm & dry w/o tenting. ?No significant lesions or rash. ? ?See clinical summary under each active problem in the Problem List with associated updated therapeutic plan ? ?

## 2021-08-15 NOTE — Assessment & Plan Note (Signed)
08/12/2021 H/H 9/23.2 at SNF.  No active bleeding dyscrasias reported. ?

## 2021-08-15 NOTE — Progress Notes (Signed)
This encounter was created in error - please disregard.

## 2021-08-15 NOTE — Assessment & Plan Note (Addendum)
08/12/21 H/H 9.5 29.2 at SNF. No bleeding dyscrasias reported. ?

## 2021-08-15 NOTE — Patient Instructions (Signed)
See assessment and plan under each diagnosis in the problem list and acutely for this visit 

## 2021-08-17 ENCOUNTER — Other Ambulatory Visit: Payer: Self-pay | Admitting: *Deleted

## 2021-08-17 NOTE — Patient Outreach (Signed)
Per [place] eligible member currently resides in [facility].  Screening for potential [facility] care coordination services as a benefit of [employer] plan. ? ?Mrs. Dishman admitted to SNF on 08/10/21 after hospitalization. ? ?Member's PCP at [facility] has [place] care coordination team available if needed. CCM services are ordered by PCP. ? ?Facility site visit to [facility] skilled nursing facility to meet with Mrs. Beretta at bedside. Mrs. Lensing' sister and her brother Dominica Severin were at bedside as well. They report transition plan is to return home. Mrs. Spurlock lives alone but has very supportive siblings that live nearby. Mrs. Buchanan has caregivers  2hrs per day Monday - Friday thru Aging, Disability, and [employer] (Council on Aging). Family reports member has recertification in May. They are hopeful that Mrs. Popovich will have caregiver hours extended at that time. States current caregiver assists with bathing and housework. Family reports there are no concerns with meals or transportation.  ? ?Discussed PCP has CCM team that assists with care coordination/care management services. Mrs. Formosa is agreeable to Probation officer making a referral upon SNF discharge.  ? ?Mrs. Bartolini confirms best contact telephone number for her is [phone]. ? ?Will continue to follow while member resides in SNF.  ? ?Will plan to make referral for CCM services with [facility].  ? ?Mrs. Anselmo has medical history of CHF, HLD, GERD, HTN, DM.  ? ? ?Marthenia Rolling, MSN, RN,BSN ?[place] Coordinator ?[phone] [facility]) ?[phone]  (Toll free office)  ? ? ? ? ? ? ? ?  ?

## 2021-08-18 ENCOUNTER — Non-Acute Institutional Stay (SKILLED_NURSING_FACILITY): Payer: Medicare Other | Admitting: Adult Health

## 2021-08-18 DIAGNOSIS — B002 Herpesviral gingivostomatitis and pharyngotonsillitis: Secondary | ICD-10-CM

## 2021-08-18 NOTE — Progress Notes (Signed)
? ?Location:  [facility] ?Nursing Home Room Number: 216A ?Place of Service:  SNF (31) ?Provider:  Durenda Age, DNP, FNP-BC ? ?Patient Care Team: ?Janora Norlander, DO as PCP - General (Family Medicine) ?Josue Hector, MD as PCP - Cardiology (Cardiology) ? ?Extended Emergency Contact Information ?Primary Emergency Contact: Hopper,Hazel ?Address: [address] ?         [place], [place] [address] [place] ?Home Phone: [phone] ?Mobile Phone: [phone] ?Relation: Sister ?Secondary Emergency Contact: Pavy,Janice ?Mobile Phone: [phone] ?Relation: Sister ? ?Code Status:  DNR ? ?Goals of care: Advanced Directive information ? ?  08/24/2021  ?  3:18 PM  ?Advanced Directives  ?Does Patient Have a Medical Advance Directive? Yes  ?Type of Advance Directive Out of facility DNR (pink MOST or yellow form)  ?Does patient want to make changes to medical advance directive? No - Patient declined  ? ? ? ?Chief Complaint  ?Patient presents with  ? Acute Visit  ?  Blisters on tongue  ? ? ?HPI:  ?Pt is a 86 y.o. female seen today for an acute visit regarding blisters on her tongue. She is currently having a short-term rehabilitation at [facility]. Sister at bedside. Noted to have blisters on her tongue and on right corner of her mouth. She complains of burning sensation in her mouth when she eats. No reported fever, Sister stated that she has noticed the blisters yesterday and seems to be increasing.  ? ? ?Past Medical History:  ?Diagnosis Date  ? Arthritis   ? OA  ? CHF (congestive heart failure) ([place])   ? DDD (degenerative disc disease), lumbosacral   ? Diabetes mellitus without complication ([place])   ? Distal radius fracture   ? bilateral  ? GERD (gastroesophageal reflux disease)   ? Glaucoma   ? HOH (hard of hearing)   ? left ear  ? Hypertension   ? PONV (postoperative nausea and vomiting)   ? ?Past Surgical History:  ?Procedure Laterality Date  ? ABDOMINAL HYSTERECTOMY     ? BREAST BIOPSY    ? BREAST EXCISIONAL BIOPSY Left   ? benign  ? BREAST SURGERY    ? left milk duct   ? EYE SURGERY    ? cataract  ? FRACTURE SURGERY Left   ? hip fracture  ? HIP FRACTURE SURGERY Right   ? INTRAMEDULLARY (IM) NAIL INTERTROCHANTERIC Left 08/06/2021  ? Procedure: INTRAMEDULLARY (IM) NAIL INTERTROCHANTRIC;  Surgeon: Tania Ade, MD;  Location: [facility];  Service: Orthopedics;  Laterality: Left;  ? OPEN REDUCTION INTERNAL FIXATION (ORIF) DISTAL RADIAL FRACTURE Bilateral 05/24/2015  ? Procedure: OPEN REDUCTION INTERNAL FIXATION (ORIF) BILATERAL DISTAL RADIAL FRACTURE VOLAR PLATE;  Surgeon: Daryll Brod, MD;  Location: [facility];  Service: Orthopedics;  Laterality: Bilateral;  ? ? ?Allergies  ?Allergen Reactions  ? Codeine Nausea Only  ? ? ?Outpatient Encounter Medications as of 08/18/2021  ?Medication Sig  ? [EXPIRED] acyclovir (ZOVIRAX) 400 MG tablet Take 400 mg by mouth 5 (five) times daily.  ? albuterol (VENTOLIN HFA) 108 (90 Base) MCG/ACT inhaler Inhale 2 puffs into the lungs every 6 (six) hours as needed for wheezing or shortness of breath.  ? amLODipine (NORVASC) 5 MG tablet Take 1 tablet (5 mg total) by mouth daily.  ? aspirin 81 MG tablet Take 81 mg by mouth daily.  ? atorvastatin (LIPITOR) 20 MG tablet Take 1 tablet (20 mg total) by mouth daily.  ? bisacodyl (DULCOLAX) 10 MG suppository Place 10  mg rectally as needed for moderate constipation.  ? calcium-vitamin D (OSCAL WITH D) 500-200 MG-UNIT tablet Take 1 tablet by mouth.  ? Cetirizine HCl (ZYRTEC ALLERGY) 10 MG CAPS Take 1 capsule by mouth daily.  ? DEXILANT 60 MG capsule Take 1 capsule by mouth daily.  ? fluticasone (FLONASE) 50 MCG/ACT nasal spray Place 2 sprays into both nostrils daily.  ? Glucerna (GLUCERNA) LIQD Take 237 mLs by mouth.  ? latanoprost (XALATAN) 0.005 % ophthalmic solution 1 drop at bedtime.  ? lisinopril (ZESTRIL) 40 MG tablet Take 1 tablet by mouth once daily  ? loperamide (IMODIUM) 2 MG capsule Take 2 mg  by mouth as needed for diarrhea or loose stools.  ? Magnesium Hydroxide (MILK OF MAGNESIA PO) Take 30 mLs by mouth as needed.  ? Magnesium Oxide 400 (240 Mg) MG TABS Take 1 tablet (400 mg total) by mouth daily.  ? Menthol, Topical Analgesic, (BIOFREEZE) 4 % GEL Apply 1 application. topically in the morning and at bedtime.  ? metFORMIN (GLUCOPHAGE) 500 MG tablet Take by mouth 2 (two) times daily with a meal.  ? metoprolol tartrate (LOPRESSOR) 25 MG tablet Take 1 tablet (25 mg total) by mouth 2 (two) times daily.  ? multivitamin-iron-minerals-folic acid (CENTRUM) chewable tablet Chew 1 tablet by mouth daily.  ? nitroGLYCERIN (NITROSTAT) 0.4 MG SL tablet Place 0.4 mg under the tongue every 5 (five) minutes as needed for chest pain.  ? senna (SENOKOT) 8.6 MG TABS tablet Take 1 tablet (8.6 mg total) by mouth daily.  ? sitaGLIPtin (JANUVIA) 25 MG tablet Take 25 mg by mouth daily.  ? Sodium Phosphates (RA SALINE ENEMA RE) Place rectally.  ? timolol (TIMOPTIC) 0.5 % ophthalmic solution Place 1 drop into both eyes daily.  ? torsemide (DEMADEX) 20 MG tablet Take 2 tablets (40 mg total) by mouth daily.  ? traMADol (ULTRAM) 50 MG tablet Take 1 tablet (50 mg total) by mouth every 6 (six) hours as needed.  ? [DISCONTINUED] DOXYCYCLINE HYCLATE PO Take 100 mg by mouth. Twice a day for 7 days  ? [DISCONTINUED] promethazine (PHENERGAN) 25 MG tablet Take 25 mg by mouth every 6 (six) hours as needed for nausea or vomiting. X3 doses  ? Elastic Bandages & Supports (LUMBAR BACK BRACE/SUPPORT PAD) MISC Velcro closing back brace.  Use as needed for back pain.  Use no more than 1 time per week.  ? [DISCONTINUED] sitaGLIPtin-metformin (JANUMET) 50-1000 MG tablet Take 0.5 tablets by mouth 2 (two) times daily with a meal. (Patient taking differently: Take 1 tablet by mouth 2 (two) times daily with a meal.)  ? ?No facility-administered encounter medications on file as of 08/18/2021.  ? ? ?Review of Systems  ?Constitutional:  Negative for appetite  change, chills, fatigue and fever.  ?HENT:  Negative for congestion, hearing loss, rhinorrhea and sore throat.   ?Eyes: Negative.   ?Respiratory:  Negative for cough, shortness of breath and wheezing.   ?Cardiovascular:  Negative for chest pain, palpitations and leg swelling.  ?Gastrointestinal:  Negative for abdominal pain, constipation, diarrhea, nausea and vomiting.  ?Genitourinary:  Negative for dysuria.  ?Musculoskeletal:  Negative for arthralgias, back pain and myalgias.  ?Skin:  Negative for color change, rash and wound.  ?Neurological:  Negative for dizziness, weakness and headaches.  ?Psychiatric/Behavioral:  Negative for behavioral problems. The patient is not nervous/anxious.    ? ? ? ?Immunization History  ?Administered Date(s) Administered  ? Fluad Quad(high Dose 65+) 02/12/2019  ? Influenza Split 02/07/2016, 02/08/2017  ?  Influenza, High Dose Seasonal PF 01/12/2015, 02/18/2018  ? Influenza,inj,Quad PF,6+ Mos 02/20/2018  ? Influenza,trivalent, recombinat, inj, PF 02/17/2014, 02/08/2017  ? Influenza-Unspecified 03/11/2020  ? Moderna Sars-Covid-2 Vaccination 07/03/2019, 07/29/2019  ? Pneumococcal Conjugate-13 01/12/2015  ? Pneumococcal Polysaccharide-23 06/12/2016  ? Tdap 06/11/2003, 06/12/2003, 09/17/2017  ? Zoster, Live 01/12/2015  ? ?Pertinent  Health Maintenance Due  ?Topic Date Due  ? OPHTHALMOLOGY EXAM  Never done  ? INFLUENZA VACCINE  12/06/2021  ? HEMOGLOBIN A1C  02/03/2022  ? FOOT EXAM  04/13/2022  ? DEXA SCAN  Completed  ? ? ?  08/08/2021  ?  8:00 AM 08/08/2021  ?  7:48 PM 08/09/2021  ?  7:40 AM 08/09/2021  ?  8:00 PM 08/10/2021  ? 10:00 AM  ?Fall Risk  ?Patient Fall Risk Level High fall risk High fall risk High fall risk High fall risk High fall risk  ? ? ? ?Vitals:  ? 08/18/21 1400  ?BP: (!) 111/54  ?Pulse: 60  ?Resp: 18  ?Temp: 97.9 ?F (36.6 ?C)  ?Weight: 129 lb 6.4 oz (58.7 kg)  ?Height: 5' (1.524 m)  ? ?Body mass index is 25.27 kg/m?. ? ?Physical Exam ?Constitutional:   ?   Appearance: Normal  appearance.  ?HENT:  ?   Head: Normocephalic and atraumatic.  ?   Nose: Nose normal.  ?   Mouth/Throat:  ?   Mouth: Mucous membranes are moist.  ?   Comments: Multiple blisters on tongue  ?Eyes:  ?   Conjun

## 2021-08-19 DIAGNOSIS — S72142A Displaced intertrochanteric fracture of left femur, initial encounter for closed fracture: Secondary | ICD-10-CM | POA: Diagnosis not present

## 2021-08-19 DIAGNOSIS — Z9889 Other specified postprocedural states: Secondary | ICD-10-CM | POA: Diagnosis not present

## 2021-08-22 DIAGNOSIS — L89100 Pressure ulcer of unspecified part of back, unstageable: Secondary | ICD-10-CM | POA: Diagnosis not present

## 2021-08-22 LAB — CBC AND DIFFERENTIAL
HCT: 31 — AB (ref 36–46)
Hemoglobin: 9.9 — AB (ref 12.0–16.0)
Platelets: 534 10*3/uL — AB (ref 150–400)
WBC: 14

## 2021-08-22 LAB — BASIC METABOLIC PANEL
BUN: 28 — AB (ref 4–21)
CO2: 25 — AB (ref 13–22)
Chloride: 100 (ref 99–108)
Creatinine: 1 (ref 0.5–1.1)
Glucose: 228
Potassium: 3.6 mEq/L (ref 3.5–5.1)
Sodium: 138 (ref 137–147)

## 2021-08-22 LAB — CBC: RBC: 3.57 — AB (ref 3.87–5.11)

## 2021-08-22 LAB — COMPREHENSIVE METABOLIC PANEL: Calcium: 8 — AB (ref 8.7–10.7)

## 2021-08-24 ENCOUNTER — Encounter: Payer: Self-pay | Admitting: Adult Health

## 2021-08-24 ENCOUNTER — Non-Acute Institutional Stay (SKILLED_NURSING_FACILITY): Payer: Medicare Other | Admitting: Adult Health

## 2021-08-24 DIAGNOSIS — S72142S Displaced intertrochanteric fracture of left femur, sequela: Secondary | ICD-10-CM | POA: Diagnosis not present

## 2021-08-24 DIAGNOSIS — M62838 Other muscle spasm: Secondary | ICD-10-CM | POA: Diagnosis not present

## 2021-08-24 NOTE — Progress Notes (Signed)
? ?Location:  [facility] ?Nursing Home Room Number: 216-A ?Place of Service:  SNF (31) ?Provider:  Durenda Age, DNP, FNP-BC ? ?Patient Care Team: ?Janora Norlander, DO as PCP - General (Family Medicine) ?Josue Hector, MD as PCP - Cardiology (Cardiology) ? ?Extended Emergency Contact Information ?Primary Emergency Contact: Hopper,Hazel ?Address: [address] ?         [place], [place] [address] [place] ?Home Phone: [phone] ?Mobile Phone: [phone] ?Relation: Sister ?Secondary Emergency Contact: Pavy,Janice ?Mobile Phone: [phone] ?Relation: Sister ? ?Code Status:  DNR ? ?Goals of care: Advanced Directive information ? ?  08/24/2021  ?  3:18 PM  ?Advanced Directives  ?Does Patient Have a Medical Advance Directive? Yes  ?Type of Advance Directive Out of facility DNR (pink MOST or yellow form)  ?Does patient want to make changes to medical advance directive? No - Patient declined  ? ? ? ?Chief Complaint  ?Patient presents with  ? Acute Visit  ?  Muscle spasm   ? ? ?HPI:  ?Pt is a 86 y.o. female seen today for an acute visit regarding spastic pain on her chest. She is a short-term care resident of [facility]. Chest imaging done on 08/05/21 showed old healed posterior left approximate sixth and seventh rib fractures. There are multiple thoracic and upper lumbar vertebral body compression fractures which includes moderate AR T11 and severe anterior T12 and L1 compression fractures.  Moderately severe T7 compression is unchanged from 08/13/20 CT chest. She has mild-to-moderate dextrocurvature of the mid to lower thoracic spine. No reported SOB. ? ?She was admitted to [facility] on 08/10/21 post hospital admission 08/05/2021 to 08/10/21 post mechanical fall at home sustaining a left intertrochanteric fracture for which she had left hip intramedullary nailing on 08/06/21. ? ? ?Past Medical History:  ?Diagnosis Date  ? Arthritis   ? OA  ?  CHF (congestive heart failure) ([place])   ? DDD (degenerative disc disease), lumbosacral   ? Diabetes mellitus without complication ([place])   ? Distal radius fracture   ? bilateral  ? GERD (gastroesophageal reflux disease)   ? Glaucoma   ? HOH (hard of hearing)   ? left ear  ? Hypertension   ? PONV (postoperative nausea and vomiting)   ? ?Past Surgical History:  ?Procedure Laterality Date  ? ABDOMINAL HYSTERECTOMY    ? BREAST BIOPSY    ? BREAST EXCISIONAL BIOPSY Left   ? benign  ? BREAST SURGERY    ? left milk duct   ? EYE SURGERY    ? cataract  ? FRACTURE SURGERY Left   ? hip fracture  ? HIP FRACTURE SURGERY Right   ? INTRAMEDULLARY (IM) NAIL INTERTROCHANTERIC Left 08/06/2021  ? Procedure: INTRAMEDULLARY (IM) NAIL INTERTROCHANTRIC;  Surgeon: Tania Ade, MD;  Location: [facility];  Service: Orthopedics;  Laterality: Left;  ? OPEN REDUCTION INTERNAL FIXATION (ORIF) DISTAL RADIAL FRACTURE Bilateral 05/24/2015  ? Procedure: OPEN REDUCTION INTERNAL FIXATION (ORIF) BILATERAL DISTAL RADIAL FRACTURE VOLAR PLATE;  Surgeon: Daryll Brod, MD;  Location: [facility];  Service: Orthopedics;  Laterality: Bilateral;  ? ? ?Allergies  ?Allergen Reactions  ? Codeine Nausea Only  ? ? ?Outpatient Encounter Medications as of 08/24/2021  ?Medication Sig  ? [EXPIRED] acyclovir (ZOVIRAX) 400 MG tablet Take 400 mg by mouth 5 (five) times daily.  ? albuterol (VENTOLIN HFA) 108 (90 Base) MCG/ACT inhaler Inhale 2 puffs into the lungs every 6 (six) hours as needed for wheezing or  shortness of breath.  ? amLODipine (NORVASC) 5 MG tablet Take 1 tablet (5 mg total) by mouth daily.  ? aspirin 81 MG tablet Take 81 mg by mouth daily.  ? atorvastatin (LIPITOR) 20 MG tablet Take 1 tablet (20 mg total) by mouth daily.  ? Baclofen 5 MG TABS Take 1 tablet by mouth daily.  ? bisacodyl (DULCOLAX) 10 MG suppository Place 10 mg rectally as needed for moderate constipation.  ? calcium-vitamin D (OSCAL WITH D) 500-200 MG-UNIT tablet Take 1 tablet by  mouth.  ? Cetirizine HCl (ZYRTEC ALLERGY) 10 MG CAPS Take 1 capsule by mouth daily.  ? DEXILANT 60 MG capsule Take 1 capsule by mouth daily.  ? fluticasone (FLONASE) 50 MCG/ACT nasal spray Place 2 sprays into both nostrils daily.  ? Glucerna (GLUCERNA) LIQD Take 237 mLs by mouth.  ? latanoprost (XALATAN) 0.005 % ophthalmic solution 1 drop at bedtime.  ? lisinopril (ZESTRIL) 40 MG tablet Take 1 tablet by mouth once daily  ? loperamide (IMODIUM) 2 MG capsule Take 2 mg by mouth as needed for diarrhea or loose stools.  ? Magnesium Hydroxide (MILK OF MAGNESIA PO) Take 30 mLs by mouth as needed.  ? Magnesium Oxide 400 (240 Mg) MG TABS Take 1 tablet (400 mg total) by mouth daily.  ? Menthol, Topical Analgesic, (BIOFREEZE) 4 % GEL Apply 1 application. topically in the morning and at bedtime.  ? metFORMIN (GLUCOPHAGE) 500 MG tablet Take by mouth 2 (two) times daily with a meal.  ? metoprolol tartrate (LOPRESSOR) 25 MG tablet Take 1 tablet (25 mg total) by mouth 2 (two) times daily.  ? multivitamin-iron-minerals-folic acid (CENTRUM) chewable tablet Chew 1 tablet by mouth daily.  ? nitroGLYCERIN (NITROSTAT) 0.4 MG SL tablet Place 0.4 mg under the tongue every 5 (five) minutes as needed for chest pain.  ? Saccharomyces boulardii (PROBIOTIC) 250 MG CAPS Take 1 tablet by mouth daily.  ? senna (SENOKOT) 8.6 MG TABS tablet Take 1 tablet (8.6 mg total) by mouth daily.  ? sitaGLIPtin (JANUVIA) 25 MG tablet Take 25 mg by mouth daily.  ? Sodium Phosphates (RA SALINE ENEMA RE) Place rectally.  ? timolol (TIMOPTIC) 0.5 % ophthalmic solution Place 1 drop into both eyes daily.  ? torsemide (DEMADEX) 20 MG tablet Take 2 tablets (40 mg total) by mouth daily.  ? traMADol (ULTRAM) 50 MG tablet Take 1 tablet (50 mg total) by mouth every 6 (six) hours as needed.  ? Elastic Bandages & Supports (LUMBAR BACK BRACE/SUPPORT PAD) MISC Velcro closing back brace.  Use as needed for back pain.  Use no more than 1 time per week.  ? [DISCONTINUED]  DOXYCYCLINE HYCLATE PO Take 100 mg by mouth. Twice a day for 7 days  ? [DISCONTINUED] promethazine (PHENERGAN) 25 MG tablet Take 25 mg by mouth every 6 (six) hours as needed for nausea or vomiting. X3 doses  ? ?No facility-administered encounter medications on file as of 08/24/2021.  ? ? ?Review of Systems  ?Constitutional:  Negative for appetite change, chills, fatigue and fever.  ?HENT:  Negative for congestion, hearing loss, rhinorrhea and sore throat.   ?Eyes: Negative.   ?Respiratory:  Negative for cough, shortness of breath and wheezing.   ?Cardiovascular:  Negative for chest pain, palpitations and leg swelling.  ?Gastrointestinal:  Negative for abdominal pain, constipation, diarrhea, nausea and vomiting.  ?Genitourinary:  Negative for dysuria.  ?Skin:  Positive for wound. Negative for color change and rash.  ?Neurological:  Negative for dizziness, weakness and headaches.  ?  Psychiatric/Behavioral:  Negative for behavioral problems. The patient is not nervous/anxious.    ? ? ? ?Immunization History  ?Administered Date(s) Administered  ? Fluad Quad(high Dose 65+) 02/12/2019  ? Influenza Split 02/07/2016, 02/08/2017  ? Influenza, High Dose Seasonal PF 01/12/2015, 02/18/2018  ? Influenza,inj,Quad PF,6+ Mos 02/20/2018  ? Influenza,trivalent, recombinat, inj, PF 02/17/2014, 02/08/2017  ? Influenza-Unspecified 03/11/2020  ? Moderna Sars-Covid-2 Vaccination 07/03/2019, 07/29/2019  ? Pneumococcal Conjugate-13 01/12/2015  ? Pneumococcal Polysaccharide-23 06/12/2016  ? Tdap 06/11/2003, 06/12/2003, 09/17/2017  ? Zoster, Live 01/12/2015  ? ?Pertinent  Health Maintenance Due  ?Topic Date Due  ? OPHTHALMOLOGY EXAM  Never done  ? INFLUENZA VACCINE  12/06/2021  ? HEMOGLOBIN A1C  02/03/2022  ? FOOT EXAM  04/13/2022  ? DEXA SCAN  Completed  ? ? ?  08/08/2021  ?  8:00 AM 08/08/2021  ?  7:48 PM 08/09/2021  ?  7:40 AM 08/09/2021  ?  8:00 PM 08/10/2021  ? 10:00 AM  ?Fall Risk  ?Patient Fall Risk Level High fall risk High fall risk High fall  risk High fall risk High fall risk  ? ? ? ?Vitals:  ? 08/24/21 1512  ?BP: (!) 142/61  ?Pulse: 100  ?Resp: 18  ?Temp: (!) 97 ?F (36.1 ?C)  ?SpO2: 96%  ?Weight: 109 lb 3.2 oz (49.5 kg)  ?Height: 5' (1.524 m)  ? ?Body ma

## 2021-08-29 ENCOUNTER — Non-Acute Institutional Stay (SKILLED_NURSING_FACILITY): Payer: Medicare Other | Admitting: Adult Health

## 2021-08-29 ENCOUNTER — Encounter: Payer: Self-pay | Admitting: Adult Health

## 2021-08-29 DIAGNOSIS — S72142S Displaced intertrochanteric fracture of left femur, sequela: Secondary | ICD-10-CM

## 2021-08-29 DIAGNOSIS — N1831 Chronic kidney disease, stage 3a: Secondary | ICD-10-CM | POA: Diagnosis not present

## 2021-08-29 DIAGNOSIS — E1122 Type 2 diabetes mellitus with diabetic chronic kidney disease: Secondary | ICD-10-CM

## 2021-08-29 DIAGNOSIS — K219 Gastro-esophageal reflux disease without esophagitis: Secondary | ICD-10-CM | POA: Diagnosis not present

## 2021-08-29 DIAGNOSIS — E1159 Type 2 diabetes mellitus with other circulatory complications: Secondary | ICD-10-CM | POA: Diagnosis not present

## 2021-08-29 DIAGNOSIS — I5032 Chronic diastolic (congestive) heart failure: Secondary | ICD-10-CM | POA: Diagnosis not present

## 2021-08-29 DIAGNOSIS — E782 Mixed hyperlipidemia: Secondary | ICD-10-CM

## 2021-08-29 DIAGNOSIS — L89612 Pressure ulcer of right heel, stage 2: Secondary | ICD-10-CM | POA: Diagnosis not present

## 2021-08-29 DIAGNOSIS — R0789 Other chest pain: Secondary | ICD-10-CM

## 2021-08-29 DIAGNOSIS — L89626 Pressure-induced deep tissue damage of left heel: Secondary | ICD-10-CM | POA: Diagnosis not present

## 2021-08-29 DIAGNOSIS — S32592S Other specified fracture of left pubis, sequela: Secondary | ICD-10-CM

## 2021-08-29 DIAGNOSIS — L89100 Pressure ulcer of unspecified part of back, unstageable: Secondary | ICD-10-CM | POA: Diagnosis not present

## 2021-08-29 DIAGNOSIS — I152 Hypertension secondary to endocrine disorders: Secondary | ICD-10-CM

## 2021-08-29 DIAGNOSIS — B002 Herpesviral gingivostomatitis and pharyngotonsillitis: Secondary | ICD-10-CM

## 2021-08-29 NOTE — Progress Notes (Signed)
? ?Location:  [facility] ?Nursing Home Room Number: 216-A ?Place of Service:  SNF (31) ?Provider:  Durenda Age, DNP, FNP-BC ? ?Patient Care Team: ?Janora Norlander, DO as PCP - General (Family Medicine) ?Josue Hector, MD as PCP - Cardiology (Cardiology) ? ?Extended Emergency Contact Information ?Primary Emergency Contact: Hopper,Hazel ?Address: [address] ?         [place], [place] [address] [place] ?Home Phone: [phone] ?Mobile Phone: [phone] ?Relation: Sister ?Secondary Emergency Contact: Pavy,Janice ?Mobile Phone: [phone] ?Relation: Sister ? ?Code Status:  DNR ? ?Goals of care: Advanced Directive information ? ?  08/29/2021  ?  2:30 PM  ?Advanced Directives  ?Does Patient Have a Medical Advance Directive? Yes  ?Type of Advance Directive Out of facility DNR (pink MOST or yellow form)  ?Does patient want to make changes to medical advance directive? No - Patient declined  ?Pre-existing out of facility DNR order (yellow form or pink MOST form) Yellow form placed in chart (order not valid for inpatient use)  ? ? ? ?Chief Complaint  ?Patient presents with  ? Discharge Note  ?  Discharged to [facility] on 08/30/2021 to be closer to family.  ? ? ?HPI:  ?Pt is a 86 y.o. female who is for transfer to [facility] on 08/30/21 to be closer to family. ? ?She was admitted to [facility] on 08/30/21 post hospital admission on 08/05/21 to 08/10/21. She has a PMH of diabetes mellitus type 2, hypertension, CHF, GERD, arthritis, osteoporosis and glaucoma. She presented to ED S/P mechanical fall on 08/05/21 while bringing the trash can then trip and fell landing on her left side. Left hip x-ray showed markedly comminuted and moderately displaced intertrochanteric fracture and an additional nondisplaced acute superior left pubic ramus fracture.  Orthopedics was consulted and performed left hip intramedullary nailing on 4/01.  Hospitalization was complicated by  anemia post operative bleed, hemoglobin dropped to 6.7.  She was transfused 1 unit PRBC on 4/03.  She had sinus tachycardia for which she was started on metoprolol 25 mg twice a day on 4/04. ? ?At [facility], she was treated for oral herpes simplex and completed Acyclovir course. Blisters on tongue has now resolved. ? ?She had PT, OT and ST at [facility]. ? ? ?Past Medical History:  ?Diagnosis Date  ? Arthritis   ? OA  ? CHF (congestive heart failure) ([place])   ? DDD (degenerative disc disease), lumbosacral   ? Diabetes mellitus without complication ([place])   ? Distal radius fracture   ? bilateral  ? GERD (gastroesophageal reflux disease)   ? Glaucoma   ? HOH (hard of hearing)   ? left ear  ? Hypertension   ? PONV (postoperative nausea and vomiting)   ? ?Past Surgical History:  ?Procedure Laterality Date  ? ABDOMINAL HYSTERECTOMY    ? BREAST BIOPSY    ? BREAST EXCISIONAL BIOPSY Left   ? benign  ? BREAST SURGERY    ? left milk duct   ? EYE SURGERY    ? cataract  ? FRACTURE SURGERY Left   ? hip fracture  ? HIP FRACTURE SURGERY Right   ? INTRAMEDULLARY (IM) NAIL INTERTROCHANTERIC Left 08/06/2021  ? Procedure: INTRAMEDULLARY (IM) NAIL INTERTROCHANTRIC;  Surgeon: Tania Ade, MD;  Location: [place];  Service: Orthopedics;  Laterality: Left;  ? OPEN REDUCTION INTERNAL FIXATION (ORIF) DISTAL RADIAL FRACTURE Bilateral 05/24/2015  ? Procedure: OPEN REDUCTION INTERNAL FIXATION (ORIF) BILATERAL DISTAL RADIAL FRACTURE VOLAR PLATE;  Surgeon: Daryll Brod, MD;  Location: [place];  Service: Orthopedics;  Laterality: Bilateral;  ? ? ?Allergies  ?Allergen Reactions  ? Codeine Nausea Only  ? ? ?Outpatient Encounter Medications as of 08/29/2021  ?Medication Sig  ? albuterol (VENTOLIN HFA) 108 (90 Base) MCG/ACT inhaler Inhale 2 puffs into the lungs every 6 (six) hours as needed for wheezing or shortness of breath.  ? amLODipine (NORVASC) 5 MG tablet Take 1 tablet (5 mg total) by mouth daily.  ? aspirin 81 MG tablet Take 81 mg  by mouth daily.  ? atorvastatin (LIPITOR) 20 MG tablet Take 1 tablet (20 mg total) by mouth daily.  ? Baclofen 5 MG TABS Take 1 tablet by mouth daily.  ? bisacodyl (DULCOLAX) 10 MG suppository Place 10 mg rectally as needed for moderate constipation.  ? calcium-vitamin D (OSCAL WITH D) 500-200 MG-UNIT tablet Take 1 tablet by mouth.  ? Cetirizine HCl (ZYRTEC ALLERGY) 10 MG CAPS Take 1 capsule by mouth daily.  ? DEXILANT 60 MG capsule Take 1 capsule by mouth daily.  ? fluticasone (FLONASE) 50 MCG/ACT nasal spray Place 2 sprays into both nostrils daily.  ? Glucerna (GLUCERNA) LIQD Take 237 mLs by mouth.  ? latanoprost (XALATAN) 0.005 % ophthalmic solution 1 drop at bedtime.  ? lisinopril (ZESTRIL) 40 MG tablet Take 1 tablet by mouth once daily  ? loperamide (IMODIUM) 2 MG capsule Take 2 mg by mouth as needed for diarrhea or loose stools.  ? Magnesium Hydroxide (MILK OF MAGNESIA PO) Take 30 mLs by mouth as needed.  ? Magnesium Oxide 400 (240 Mg) MG TABS Take 1 tablet (400 mg total) by mouth daily.  ? Menthol, Topical Analgesic, (BIOFREEZE) 4 % GEL Apply 1 application. topically in the morning and at bedtime.  ? metFORMIN (GLUCOPHAGE) 500 MG tablet Take by mouth 2 (two) times daily with a meal.  ? metoprolol tartrate (LOPRESSOR) 25 MG tablet Take 1 tablet (25 mg total) by mouth 2 (two) times daily.  ? multivitamin-iron-minerals-folic acid (CENTRUM) chewable tablet Chew 1 tablet by mouth daily.  ? nitroGLYCERIN (NITROSTAT) 0.4 MG SL tablet Place 0.4 mg under the tongue every 5 (five) minutes as needed for chest pain.  ? NON FORMULARY Diet:Heart healthy/NAS-1200 fluid restriction  ? Saccharomyces boulardii (PROBIOTIC) 250 MG CAPS Take 1 tablet by mouth daily.  ? senna (SENOKOT) 8.6 MG TABS tablet Take 1 tablet (8.6 mg total) by mouth daily.  ? sitaGLIPtin (JANUVIA) 25 MG tablet Take 25 mg by mouth in the morning and at bedtime.  ? Sodium Phosphates (RA SALINE ENEMA RE) Place rectally.  ? timolol (TIMOPTIC) 0.5 % ophthalmic  solution Place 1 drop into both eyes daily.  ? torsemide (DEMADEX) 20 MG tablet Take 2 tablets (40 mg total) by mouth daily.  ? traMADol (ULTRAM) 50 MG tablet Take 1 tablet (50 mg total) by mouth every 6 (six) hours as needed.  ? Elastic Bandages & Supports (LUMBAR BACK BRACE/SUPPORT PAD) MISC Velcro closing back brace.  Use as needed for back pain.  Use no more than 1 time per week.  ? ?No facility-administered encounter medications on file as of 08/29/2021.  ? ? ?Review of Systems  ?Constitutional:  Negative for appetite change, chills, fatigue and fever.  ?HENT:  Negative for congestion, hearing loss, rhinorrhea and sore throat.   ?Eyes: Negative.   ?Respiratory:  Negative for cough, shortness of breath and wheezing.   ?Cardiovascular:  Negative for chest pain, palpitations and leg swelling.  ?Gastrointestinal:  Negative for abdominal pain, constipation, diarrhea, nausea and vomiting.  ?Genitourinary:  Negative for dysuria.  ?Skin:  Positive for wound. Negative for color change and rash.  ?Neurological:  Negative for dizziness, weakness and headaches.  ?Psychiatric/Behavioral:  Negative for behavioral problems. The patient is not nervous/anxious.    ? ? ? ?Immunization History  ?Administered Date(s) Administered  ? Fluad Quad(high Dose 65+) 02/12/2019  ? Influenza Split 02/07/2016, 02/08/2017  ? Influenza, High Dose Seasonal PF 01/12/2015, 02/18/2018  ? Influenza,inj,Quad PF,6+ Mos 02/20/2018  ? Influenza,trivalent, recombinat, inj, PF 02/17/2014, 02/08/2017  ? Influenza-Unspecified 03/11/2020  ? Moderna Sars-Covid-2 Vaccination 07/03/2019, 07/29/2019  ? Pneumococcal Conjugate-13 01/12/2015  ? Pneumococcal Polysaccharide-23 06/12/2016  ? Tdap 06/11/2003, 06/12/2003, 09/17/2017  ? Zoster, Live 01/12/2015  ? ?Pertinent  Health Maintenance Due  ?Topic Date Due  ? OPHTHALMOLOGY EXAM  Never done  ? INFLUENZA VACCINE  12/06/2021  ? HEMOGLOBIN A1C  02/03/2022  ? FOOT EXAM  04/13/2022  ? DEXA SCAN  Completed  ? ? ?   08/08/2021  ?  8:00 AM 08/08/2021  ?  7:48 PM 08/09/2021  ?  7:40 AM 08/09/2021  ?  8:00 PM 08/10/2021  ? 10:00 AM  ?Fall Risk  ?Patient Fall Risk Level High fall risk High fall risk High fall risk High fall risk H

## 2021-08-30 DIAGNOSIS — K219 Gastro-esophageal reflux disease without esophagitis: Secondary | ICD-10-CM | POA: Diagnosis not present

## 2021-08-30 DIAGNOSIS — I4510 Unspecified right bundle-branch block: Secondary | ICD-10-CM | POA: Diagnosis not present

## 2021-08-30 DIAGNOSIS — D518 Other vitamin B12 deficiency anemias: Secondary | ICD-10-CM | POA: Diagnosis not present

## 2021-08-30 DIAGNOSIS — R262 Difficulty in walking, not elsewhere classified: Secondary | ICD-10-CM | POA: Diagnosis not present

## 2021-08-30 DIAGNOSIS — D649 Anemia, unspecified: Secondary | ICD-10-CM | POA: Diagnosis not present

## 2021-08-30 DIAGNOSIS — L89616 Pressure-induced deep tissue damage of right heel: Secondary | ICD-10-CM | POA: Diagnosis not present

## 2021-08-30 DIAGNOSIS — I152 Hypertension secondary to endocrine disorders: Secondary | ICD-10-CM | POA: Diagnosis not present

## 2021-08-30 DIAGNOSIS — R52 Pain, unspecified: Secondary | ICD-10-CM | POA: Diagnosis not present

## 2021-08-30 DIAGNOSIS — M5137 Other intervertebral disc degeneration, lumbosacral region: Secondary | ICD-10-CM | POA: Diagnosis not present

## 2021-08-30 DIAGNOSIS — M6281 Muscle weakness (generalized): Secondary | ICD-10-CM | POA: Diagnosis not present

## 2021-08-30 DIAGNOSIS — S32592D Other specified fracture of left pubis, subsequent encounter for fracture with routine healing: Secondary | ICD-10-CM | POA: Diagnosis not present

## 2021-08-30 DIAGNOSIS — D492 Neoplasm of unspecified behavior of bone, soft tissue, and skin: Secondary | ICD-10-CM | POA: Diagnosis not present

## 2021-08-30 DIAGNOSIS — H409 Unspecified glaucoma: Secondary | ICD-10-CM | POA: Diagnosis not present

## 2021-08-30 DIAGNOSIS — I5022 Chronic systolic (congestive) heart failure: Secondary | ICD-10-CM | POA: Diagnosis not present

## 2021-08-30 DIAGNOSIS — R0902 Hypoxemia: Secondary | ICD-10-CM | POA: Diagnosis not present

## 2021-08-30 DIAGNOSIS — N189 Chronic kidney disease, unspecified: Secondary | ICD-10-CM | POA: Diagnosis not present

## 2021-08-30 DIAGNOSIS — E559 Vitamin D deficiency, unspecified: Secondary | ICD-10-CM | POA: Diagnosis not present

## 2021-08-30 DIAGNOSIS — L89626 Pressure-induced deep tissue damage of left heel: Secondary | ICD-10-CM | POA: Diagnosis not present

## 2021-08-30 DIAGNOSIS — I2721 Secondary pulmonary arterial hypertension: Secondary | ICD-10-CM | POA: Diagnosis not present

## 2021-08-30 DIAGNOSIS — L89100 Pressure ulcer of unspecified part of back, unstageable: Secondary | ICD-10-CM | POA: Diagnosis not present

## 2021-08-30 DIAGNOSIS — E785 Hyperlipidemia, unspecified: Secondary | ICD-10-CM | POA: Diagnosis not present

## 2021-08-30 DIAGNOSIS — R278 Other lack of coordination: Secondary | ICD-10-CM | POA: Diagnosis not present

## 2021-08-30 DIAGNOSIS — I10 Essential (primary) hypertension: Secondary | ICD-10-CM | POA: Diagnosis not present

## 2021-08-30 DIAGNOSIS — I2510 Atherosclerotic heart disease of native coronary artery without angina pectoris: Secondary | ICD-10-CM | POA: Diagnosis not present

## 2021-08-30 DIAGNOSIS — S72142D Displaced intertrochanteric fracture of left femur, subsequent encounter for closed fracture with routine healing: Secondary | ICD-10-CM | POA: Diagnosis not present

## 2021-08-30 DIAGNOSIS — E7800 Pure hypercholesterolemia, unspecified: Secondary | ICD-10-CM | POA: Diagnosis not present

## 2021-08-30 DIAGNOSIS — Z9181 History of falling: Secondary | ICD-10-CM | POA: Diagnosis not present

## 2021-08-30 DIAGNOSIS — M8008XD Age-related osteoporosis with current pathological fracture, vertebra(e), subsequent encounter for fracture with routine healing: Secondary | ICD-10-CM | POA: Diagnosis not present

## 2021-08-30 DIAGNOSIS — G4700 Insomnia, unspecified: Secondary | ICD-10-CM | POA: Diagnosis not present

## 2021-08-30 DIAGNOSIS — E1169 Type 2 diabetes mellitus with other specified complication: Secondary | ICD-10-CM | POA: Diagnosis not present

## 2021-08-30 DIAGNOSIS — M62838 Other muscle spasm: Secondary | ICD-10-CM | POA: Diagnosis not present

## 2021-08-30 DIAGNOSIS — M159 Polyosteoarthritis, unspecified: Secondary | ICD-10-CM | POA: Diagnosis not present

## 2021-08-30 DIAGNOSIS — E782 Mixed hyperlipidemia: Secondary | ICD-10-CM | POA: Diagnosis not present

## 2021-08-30 DIAGNOSIS — E119 Type 2 diabetes mellitus without complications: Secondary | ICD-10-CM | POA: Diagnosis not present

## 2021-08-30 DIAGNOSIS — I5042 Chronic combined systolic (congestive) and diastolic (congestive) heart failure: Secondary | ICD-10-CM | POA: Diagnosis not present

## 2021-08-30 DIAGNOSIS — S32592S Other specified fracture of left pubis, sequela: Secondary | ICD-10-CM | POA: Diagnosis not present

## 2021-08-30 DIAGNOSIS — H9192 Unspecified hearing loss, left ear: Secondary | ICD-10-CM | POA: Diagnosis not present

## 2021-08-31 ENCOUNTER — Other Ambulatory Visit: Payer: Self-pay | Admitting: *Deleted

## 2021-08-31 DIAGNOSIS — S72142D Displaced intertrochanteric fracture of left femur, subsequent encounter for closed fracture with routine healing: Secondary | ICD-10-CM | POA: Diagnosis not present

## 2021-08-31 DIAGNOSIS — I10 Essential (primary) hypertension: Secondary | ICD-10-CM | POA: Diagnosis not present

## 2021-08-31 DIAGNOSIS — E119 Type 2 diabetes mellitus without complications: Secondary | ICD-10-CM | POA: Diagnosis not present

## 2021-08-31 DIAGNOSIS — E785 Hyperlipidemia, unspecified: Secondary | ICD-10-CM | POA: Diagnosis not present

## 2021-08-31 NOTE — Patient Outreach (Signed)
[facility] Post- Acute Care Coordinator follow up.  ? ?Member's PCP at [facility] has [place] care coordination team.  ? ?Verified in [facility] Mrs. Nakajima transitioned to [facility] from [facility] on 08/30/21. Medicaid pending. Likely LTC.  ? ?Telephone call made to Mrs. Tegeler [phone] to confirm. However, Mrs. Ridge's sister answered the phone on Mrs. Ferdinand Lango behalf. Sister confirmed Mrs. Favorite is now at [facility] and the MD is in the room with them now. Unable to speak with writer at the time.  ? ?Writer has been following for [facility] care coordination needs. However, since Mrs. Grossberg is under long term care at [facility], there are no identifiable [facility] care coordination needs at this time.  ? ?Will sign off.  ? ? ?Marthenia Rolling, MSN, RN,BSN ?[place] Coordinator ?[phone] [facility]) ?[phone]  (Toll free office)  ?

## 2021-09-01 DIAGNOSIS — H409 Unspecified glaucoma: Secondary | ICD-10-CM | POA: Diagnosis not present

## 2021-09-01 DIAGNOSIS — K219 Gastro-esophageal reflux disease without esophagitis: Secondary | ICD-10-CM | POA: Diagnosis not present

## 2021-09-01 DIAGNOSIS — R52 Pain, unspecified: Secondary | ICD-10-CM | POA: Diagnosis not present

## 2021-09-01 DIAGNOSIS — S72142D Displaced intertrochanteric fracture of left femur, subsequent encounter for closed fracture with routine healing: Secondary | ICD-10-CM | POA: Diagnosis not present

## 2021-09-02 DIAGNOSIS — I5022 Chronic systolic (congestive) heart failure: Secondary | ICD-10-CM | POA: Diagnosis not present

## 2021-09-02 DIAGNOSIS — N189 Chronic kidney disease, unspecified: Secondary | ICD-10-CM | POA: Diagnosis not present

## 2021-09-02 DIAGNOSIS — E7800 Pure hypercholesterolemia, unspecified: Secondary | ICD-10-CM | POA: Diagnosis not present

## 2021-09-02 DIAGNOSIS — E119 Type 2 diabetes mellitus without complications: Secondary | ICD-10-CM | POA: Diagnosis not present

## 2021-09-02 DIAGNOSIS — E785 Hyperlipidemia, unspecified: Secondary | ICD-10-CM | POA: Diagnosis not present

## 2021-09-02 DIAGNOSIS — D518 Other vitamin B12 deficiency anemias: Secondary | ICD-10-CM | POA: Diagnosis not present

## 2021-09-02 DIAGNOSIS — E559 Vitamin D deficiency, unspecified: Secondary | ICD-10-CM | POA: Diagnosis not present

## 2021-09-02 DIAGNOSIS — D649 Anemia, unspecified: Secondary | ICD-10-CM | POA: Diagnosis not present

## 2021-09-05 DIAGNOSIS — M6281 Muscle weakness (generalized): Secondary | ICD-10-CM | POA: Diagnosis not present

## 2021-09-05 DIAGNOSIS — R262 Difficulty in walking, not elsewhere classified: Secondary | ICD-10-CM | POA: Diagnosis not present

## 2021-09-05 DIAGNOSIS — S32592S Other specified fracture of left pubis, sequela: Secondary | ICD-10-CM | POA: Diagnosis not present

## 2021-09-05 DIAGNOSIS — S72142D Displaced intertrochanteric fracture of left femur, subsequent encounter for closed fracture with routine healing: Secondary | ICD-10-CM | POA: Diagnosis not present

## 2021-09-05 DIAGNOSIS — B370 Candidal stomatitis: Secondary | ICD-10-CM | POA: Diagnosis not present

## 2021-09-05 DIAGNOSIS — R52 Pain, unspecified: Secondary | ICD-10-CM | POA: Diagnosis not present

## 2021-09-05 DIAGNOSIS — R278 Other lack of coordination: Secondary | ICD-10-CM | POA: Diagnosis not present

## 2021-09-06 ENCOUNTER — Ambulatory Visit: Payer: Medicare Other

## 2021-09-06 DIAGNOSIS — R262 Difficulty in walking, not elsewhere classified: Secondary | ICD-10-CM | POA: Diagnosis not present

## 2021-09-06 DIAGNOSIS — M6281 Muscle weakness (generalized): Secondary | ICD-10-CM | POA: Diagnosis not present

## 2021-09-06 DIAGNOSIS — R278 Other lack of coordination: Secondary | ICD-10-CM | POA: Diagnosis not present

## 2021-09-06 DIAGNOSIS — S32592S Other specified fracture of left pubis, sequela: Secondary | ICD-10-CM | POA: Diagnosis not present

## 2021-09-07 DIAGNOSIS — N189 Chronic kidney disease, unspecified: Secondary | ICD-10-CM | POA: Diagnosis not present

## 2021-09-07 DIAGNOSIS — S32592S Other specified fracture of left pubis, sequela: Secondary | ICD-10-CM | POA: Diagnosis not present

## 2021-09-07 DIAGNOSIS — R262 Difficulty in walking, not elsewhere classified: Secondary | ICD-10-CM | POA: Diagnosis not present

## 2021-09-07 DIAGNOSIS — M6281 Muscle weakness (generalized): Secondary | ICD-10-CM | POA: Diagnosis not present

## 2021-09-07 DIAGNOSIS — R278 Other lack of coordination: Secondary | ICD-10-CM | POA: Diagnosis not present

## 2021-09-08 DIAGNOSIS — E119 Type 2 diabetes mellitus without complications: Secondary | ICD-10-CM | POA: Diagnosis not present

## 2021-09-08 DIAGNOSIS — R770 Abnormality of albumin: Secondary | ICD-10-CM | POA: Diagnosis not present

## 2021-09-08 DIAGNOSIS — R262 Difficulty in walking, not elsewhere classified: Secondary | ICD-10-CM | POA: Diagnosis not present

## 2021-09-08 DIAGNOSIS — M6281 Muscle weakness (generalized): Secondary | ICD-10-CM | POA: Diagnosis not present

## 2021-09-08 DIAGNOSIS — R278 Other lack of coordination: Secondary | ICD-10-CM | POA: Diagnosis not present

## 2021-09-08 DIAGNOSIS — N289 Disorder of kidney and ureter, unspecified: Secondary | ICD-10-CM | POA: Diagnosis not present

## 2021-09-08 DIAGNOSIS — D649 Anemia, unspecified: Secondary | ICD-10-CM | POA: Diagnosis not present

## 2021-09-08 DIAGNOSIS — S32592S Other specified fracture of left pubis, sequela: Secondary | ICD-10-CM | POA: Diagnosis not present

## 2021-09-09 DIAGNOSIS — M6281 Muscle weakness (generalized): Secondary | ICD-10-CM | POA: Diagnosis not present

## 2021-09-09 DIAGNOSIS — S32592S Other specified fracture of left pubis, sequela: Secondary | ICD-10-CM | POA: Diagnosis not present

## 2021-09-09 DIAGNOSIS — R262 Difficulty in walking, not elsewhere classified: Secondary | ICD-10-CM | POA: Diagnosis not present

## 2021-09-09 DIAGNOSIS — R278 Other lack of coordination: Secondary | ICD-10-CM | POA: Diagnosis not present

## 2021-09-12 DIAGNOSIS — M6281 Muscle weakness (generalized): Secondary | ICD-10-CM | POA: Diagnosis not present

## 2021-09-12 DIAGNOSIS — E875 Hyperkalemia: Secondary | ICD-10-CM | POA: Diagnosis not present

## 2021-09-12 DIAGNOSIS — S32592S Other specified fracture of left pubis, sequela: Secondary | ICD-10-CM | POA: Diagnosis not present

## 2021-09-12 DIAGNOSIS — R278 Other lack of coordination: Secondary | ICD-10-CM | POA: Diagnosis not present

## 2021-09-12 DIAGNOSIS — R262 Difficulty in walking, not elsewhere classified: Secondary | ICD-10-CM | POA: Diagnosis not present

## 2021-09-12 DIAGNOSIS — N289 Disorder of kidney and ureter, unspecified: Secondary | ICD-10-CM | POA: Diagnosis not present

## 2021-09-13 DIAGNOSIS — R278 Other lack of coordination: Secondary | ICD-10-CM | POA: Diagnosis not present

## 2021-09-13 DIAGNOSIS — S32592S Other specified fracture of left pubis, sequela: Secondary | ICD-10-CM | POA: Diagnosis not present

## 2021-09-13 DIAGNOSIS — R262 Difficulty in walking, not elsewhere classified: Secondary | ICD-10-CM | POA: Diagnosis not present

## 2021-09-13 DIAGNOSIS — M6281 Muscle weakness (generalized): Secondary | ICD-10-CM | POA: Diagnosis not present

## 2021-09-14 DIAGNOSIS — R278 Other lack of coordination: Secondary | ICD-10-CM | POA: Diagnosis not present

## 2021-09-14 DIAGNOSIS — R262 Difficulty in walking, not elsewhere classified: Secondary | ICD-10-CM | POA: Diagnosis not present

## 2021-09-14 DIAGNOSIS — S32592S Other specified fracture of left pubis, sequela: Secondary | ICD-10-CM | POA: Diagnosis not present

## 2021-09-14 DIAGNOSIS — M6281 Muscle weakness (generalized): Secondary | ICD-10-CM | POA: Diagnosis not present

## 2021-09-15 DIAGNOSIS — R278 Other lack of coordination: Secondary | ICD-10-CM | POA: Diagnosis not present

## 2021-09-15 DIAGNOSIS — S32592S Other specified fracture of left pubis, sequela: Secondary | ICD-10-CM | POA: Diagnosis not present

## 2021-09-15 DIAGNOSIS — M6281 Muscle weakness (generalized): Secondary | ICD-10-CM | POA: Diagnosis not present

## 2021-09-15 DIAGNOSIS — N390 Urinary tract infection, site not specified: Secondary | ICD-10-CM | POA: Diagnosis not present

## 2021-09-15 DIAGNOSIS — E875 Hyperkalemia: Secondary | ICD-10-CM | POA: Diagnosis not present

## 2021-09-15 DIAGNOSIS — R262 Difficulty in walking, not elsewhere classified: Secondary | ICD-10-CM | POA: Diagnosis not present

## 2021-09-16 DIAGNOSIS — R278 Other lack of coordination: Secondary | ICD-10-CM | POA: Diagnosis not present

## 2021-09-16 DIAGNOSIS — S32592S Other specified fracture of left pubis, sequela: Secondary | ICD-10-CM | POA: Diagnosis not present

## 2021-09-16 DIAGNOSIS — M6281 Muscle weakness (generalized): Secondary | ICD-10-CM | POA: Diagnosis not present

## 2021-09-16 DIAGNOSIS — R262 Difficulty in walking, not elsewhere classified: Secondary | ICD-10-CM | POA: Diagnosis not present

## 2021-09-17 DIAGNOSIS — M6281 Muscle weakness (generalized): Secondary | ICD-10-CM | POA: Diagnosis not present

## 2021-09-17 DIAGNOSIS — S32592S Other specified fracture of left pubis, sequela: Secondary | ICD-10-CM | POA: Diagnosis not present

## 2021-09-17 DIAGNOSIS — R278 Other lack of coordination: Secondary | ICD-10-CM | POA: Diagnosis not present

## 2021-09-17 DIAGNOSIS — R262 Difficulty in walking, not elsewhere classified: Secondary | ICD-10-CM | POA: Diagnosis not present

## 2021-09-19 DIAGNOSIS — Z79899 Other long term (current) drug therapy: Secondary | ICD-10-CM | POA: Diagnosis not present

## 2021-09-19 DIAGNOSIS — N390 Urinary tract infection, site not specified: Secondary | ICD-10-CM | POA: Diagnosis not present

## 2021-09-19 DIAGNOSIS — R278 Other lack of coordination: Secondary | ICD-10-CM | POA: Diagnosis not present

## 2021-09-19 DIAGNOSIS — M6281 Muscle weakness (generalized): Secondary | ICD-10-CM | POA: Diagnosis not present

## 2021-09-19 DIAGNOSIS — S32592S Other specified fracture of left pubis, sequela: Secondary | ICD-10-CM | POA: Diagnosis not present

## 2021-09-19 DIAGNOSIS — R262 Difficulty in walking, not elsewhere classified: Secondary | ICD-10-CM | POA: Diagnosis not present

## 2021-09-19 DIAGNOSIS — B370 Candidal stomatitis: Secondary | ICD-10-CM | POA: Diagnosis not present

## 2021-09-20 DIAGNOSIS — S32592S Other specified fracture of left pubis, sequela: Secondary | ICD-10-CM | POA: Diagnosis not present

## 2021-09-20 DIAGNOSIS — R262 Difficulty in walking, not elsewhere classified: Secondary | ICD-10-CM | POA: Diagnosis not present

## 2021-09-20 DIAGNOSIS — L89100 Pressure ulcer of unspecified part of back, unstageable: Secondary | ICD-10-CM | POA: Diagnosis not present

## 2021-09-20 DIAGNOSIS — M6281 Muscle weakness (generalized): Secondary | ICD-10-CM | POA: Diagnosis not present

## 2021-09-20 DIAGNOSIS — R278 Other lack of coordination: Secondary | ICD-10-CM | POA: Diagnosis not present

## 2021-09-20 DIAGNOSIS — Z79899 Other long term (current) drug therapy: Secondary | ICD-10-CM | POA: Diagnosis not present

## 2021-09-21 DIAGNOSIS — R278 Other lack of coordination: Secondary | ICD-10-CM | POA: Diagnosis not present

## 2021-09-21 DIAGNOSIS — S32592S Other specified fracture of left pubis, sequela: Secondary | ICD-10-CM | POA: Diagnosis not present

## 2021-09-21 DIAGNOSIS — R262 Difficulty in walking, not elsewhere classified: Secondary | ICD-10-CM | POA: Diagnosis not present

## 2021-09-21 DIAGNOSIS — M6281 Muscle weakness (generalized): Secondary | ICD-10-CM | POA: Diagnosis not present

## 2021-09-22 DIAGNOSIS — S32592S Other specified fracture of left pubis, sequela: Secondary | ICD-10-CM | POA: Diagnosis not present

## 2021-09-22 DIAGNOSIS — Z79899 Other long term (current) drug therapy: Secondary | ICD-10-CM | POA: Diagnosis not present

## 2021-09-22 DIAGNOSIS — M6281 Muscle weakness (generalized): Secondary | ICD-10-CM | POA: Diagnosis not present

## 2021-09-22 DIAGNOSIS — L89100 Pressure ulcer of unspecified part of back, unstageable: Secondary | ICD-10-CM | POA: Diagnosis not present

## 2021-09-22 DIAGNOSIS — R278 Other lack of coordination: Secondary | ICD-10-CM | POA: Diagnosis not present

## 2021-09-22 DIAGNOSIS — R262 Difficulty in walking, not elsewhere classified: Secondary | ICD-10-CM | POA: Diagnosis not present

## 2021-09-22 IMAGING — DX DG CHEST 2V
2 series · 2 of 2 positions shown · non-contrast
Comparison: 08/13/2020

CLINICAL DATA: 84-year-old female with a history of heart failure

EXAM:
CHEST - 2 VIEW

[chest pa]
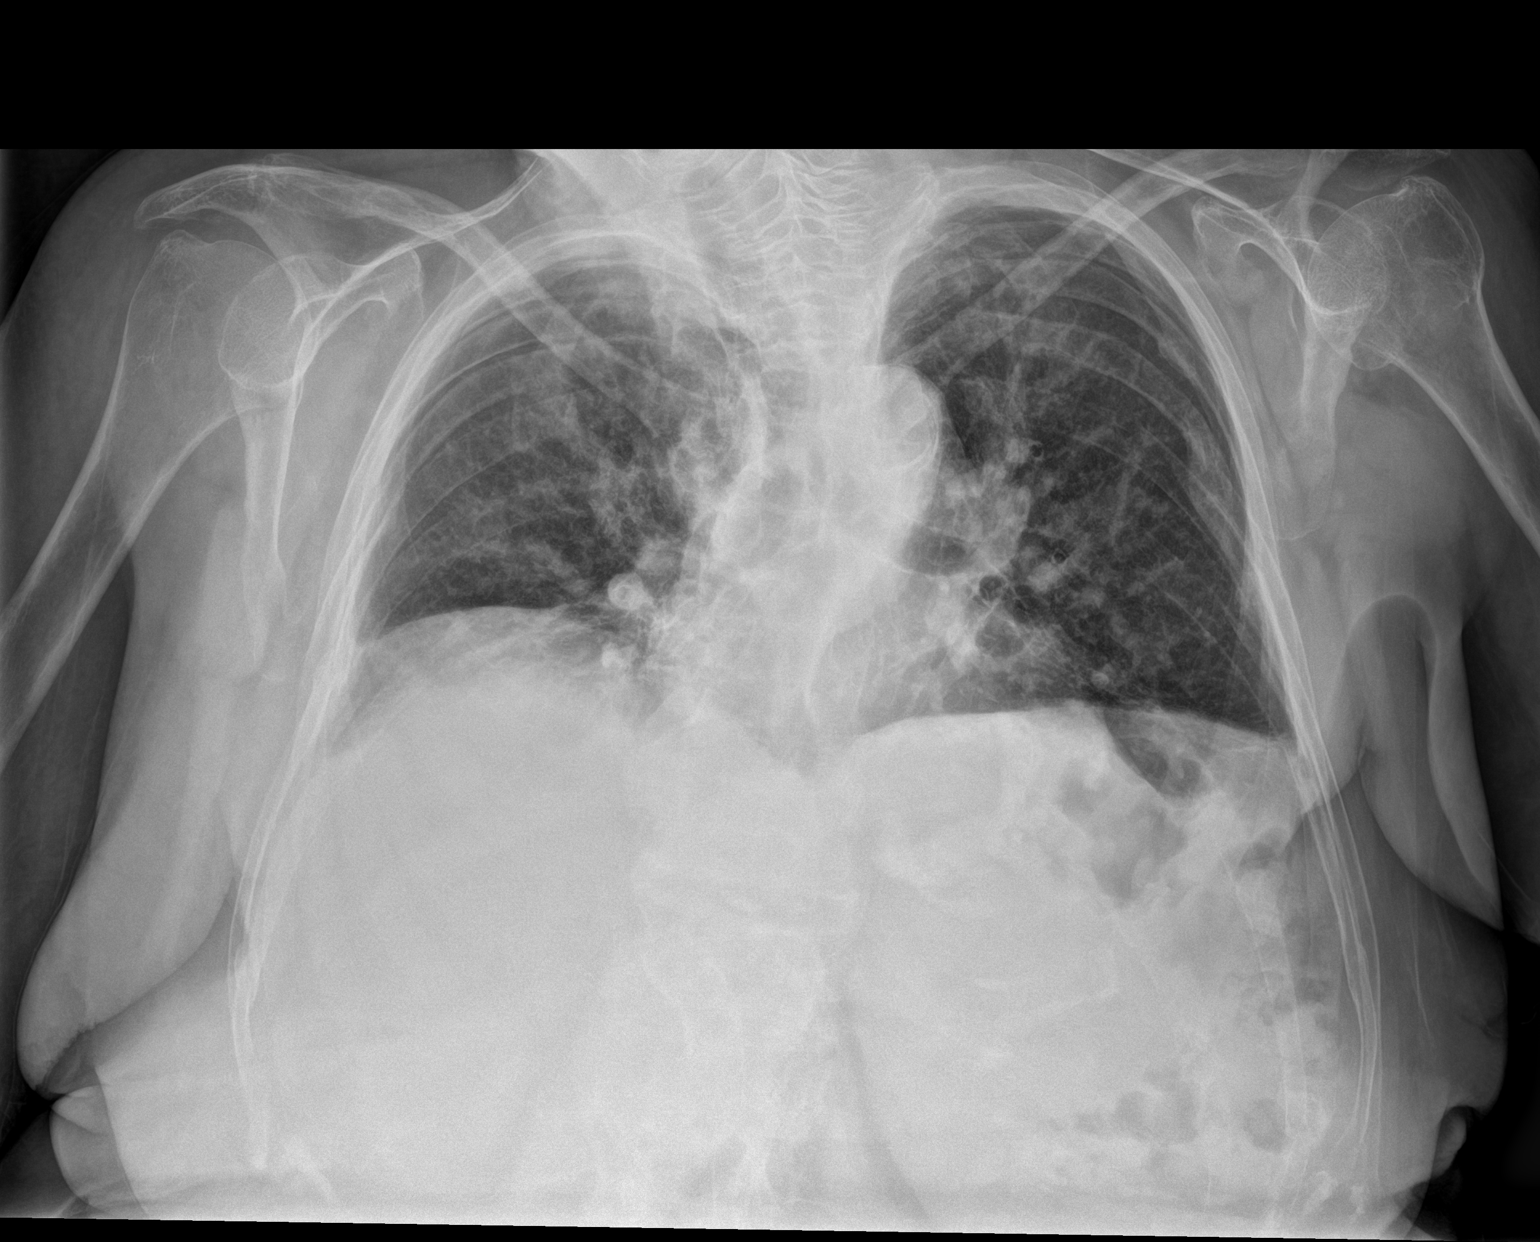

[chest lat]
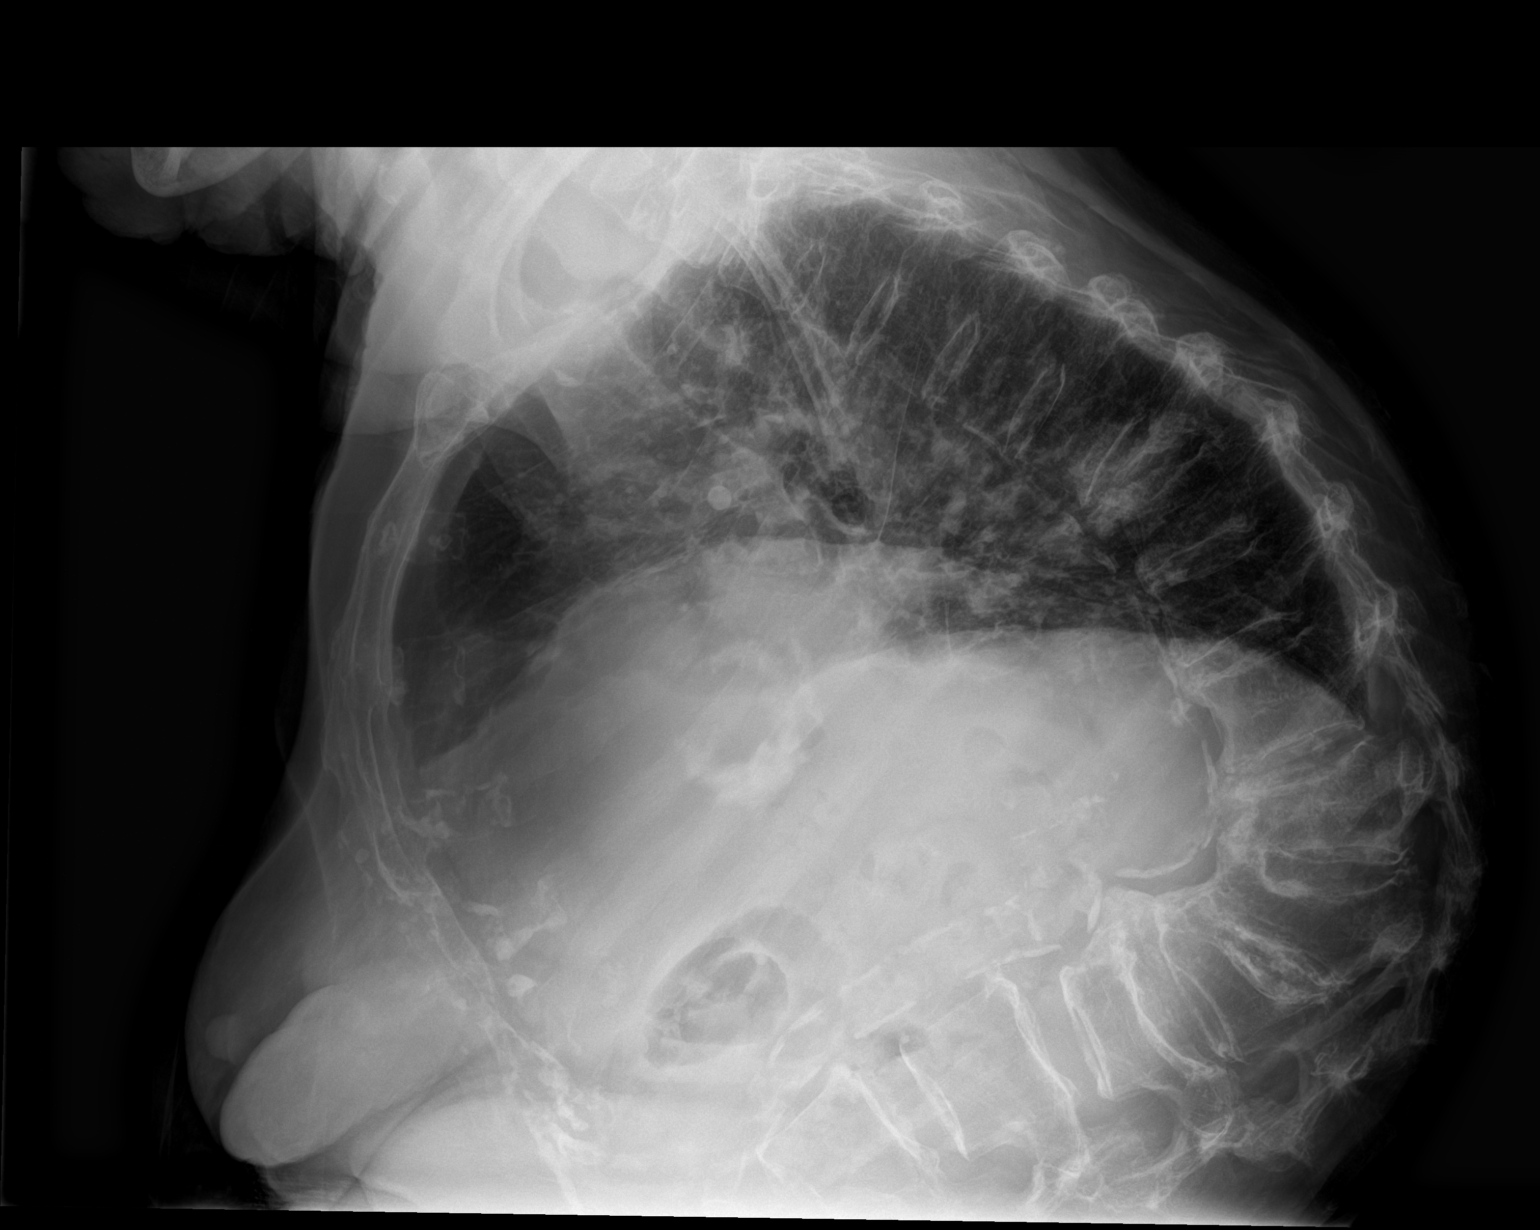

[2 of 2 positions shown; findings below may reference images not displayed]

FINDINGS: Cardiomediastinal silhouette likely unchanged, poorly evaluated
given the patient's positioning and low lung volumes.

Mitral annular calcifications.  Aortic atherosclerosis

Low lung volumes persist with coarsened interstitial markings of the
lungs. No pneumothorax. No pleural effusion. No new confluent
airspace disease. The vague reticulonodular opacities of the lungs
similar to the prior are favored to be chronic.

Unchanged configuration of midthoracic compression fracture,
kyphotic deformity at the thoracolumbar junction with associated
compression fractures. No new acute displaced fracture identified.
Degenerative changes of the visualized thoracolumbar spine.
IMPRESSION: Chronic lung changes and low lung volumes without definite evidence
of acute cardiopulmonary disease

## 2021-09-23 DIAGNOSIS — R278 Other lack of coordination: Secondary | ICD-10-CM | POA: Diagnosis not present

## 2021-09-23 DIAGNOSIS — S32592S Other specified fracture of left pubis, sequela: Secondary | ICD-10-CM | POA: Diagnosis not present

## 2021-09-23 DIAGNOSIS — R262 Difficulty in walking, not elsewhere classified: Secondary | ICD-10-CM | POA: Diagnosis not present

## 2021-09-23 DIAGNOSIS — M6281 Muscle weakness (generalized): Secondary | ICD-10-CM | POA: Diagnosis not present

## 2021-09-24 DIAGNOSIS — S32592S Other specified fracture of left pubis, sequela: Secondary | ICD-10-CM | POA: Diagnosis not present

## 2021-09-24 DIAGNOSIS — M6281 Muscle weakness (generalized): Secondary | ICD-10-CM | POA: Diagnosis not present

## 2021-09-24 DIAGNOSIS — R278 Other lack of coordination: Secondary | ICD-10-CM | POA: Diagnosis not present

## 2021-09-24 DIAGNOSIS — R262 Difficulty in walking, not elsewhere classified: Secondary | ICD-10-CM | POA: Diagnosis not present

## 2021-09-26 DIAGNOSIS — S32592S Other specified fracture of left pubis, sequela: Secondary | ICD-10-CM | POA: Diagnosis not present

## 2021-09-26 DIAGNOSIS — R278 Other lack of coordination: Secondary | ICD-10-CM | POA: Diagnosis not present

## 2021-09-26 DIAGNOSIS — R262 Difficulty in walking, not elsewhere classified: Secondary | ICD-10-CM | POA: Diagnosis not present

## 2021-09-26 DIAGNOSIS — M6281 Muscle weakness (generalized): Secondary | ICD-10-CM | POA: Diagnosis not present

## 2021-09-27 DIAGNOSIS — S32592S Other specified fracture of left pubis, sequela: Secondary | ICD-10-CM | POA: Diagnosis not present

## 2021-09-27 DIAGNOSIS — R278 Other lack of coordination: Secondary | ICD-10-CM | POA: Diagnosis not present

## 2021-09-27 DIAGNOSIS — M6281 Muscle weakness (generalized): Secondary | ICD-10-CM | POA: Diagnosis not present

## 2021-09-27 DIAGNOSIS — L89100 Pressure ulcer of unspecified part of back, unstageable: Secondary | ICD-10-CM | POA: Diagnosis not present

## 2021-09-27 DIAGNOSIS — R262 Difficulty in walking, not elsewhere classified: Secondary | ICD-10-CM | POA: Diagnosis not present

## 2021-09-28 DIAGNOSIS — I2729 Other secondary pulmonary hypertension: Secondary | ICD-10-CM | POA: Diagnosis not present

## 2021-09-28 DIAGNOSIS — I5032 Chronic diastolic (congestive) heart failure: Secondary | ICD-10-CM | POA: Diagnosis not present

## 2021-09-28 DIAGNOSIS — E1122 Type 2 diabetes mellitus with diabetic chronic kidney disease: Secondary | ICD-10-CM | POA: Diagnosis not present

## 2021-09-28 DIAGNOSIS — M6281 Muscle weakness (generalized): Secondary | ICD-10-CM | POA: Diagnosis not present

## 2021-09-28 DIAGNOSIS — R809 Proteinuria, unspecified: Secondary | ICD-10-CM | POA: Diagnosis not present

## 2021-09-28 DIAGNOSIS — E46 Unspecified protein-calorie malnutrition: Secondary | ICD-10-CM | POA: Diagnosis not present

## 2021-09-28 DIAGNOSIS — R278 Other lack of coordination: Secondary | ICD-10-CM | POA: Diagnosis not present

## 2021-09-28 DIAGNOSIS — R262 Difficulty in walking, not elsewhere classified: Secondary | ICD-10-CM | POA: Diagnosis not present

## 2021-09-28 DIAGNOSIS — S32592S Other specified fracture of left pubis, sequela: Secondary | ICD-10-CM | POA: Diagnosis not present

## 2021-09-28 DIAGNOSIS — I2721 Secondary pulmonary arterial hypertension: Secondary | ICD-10-CM | POA: Diagnosis not present

## 2021-09-28 DIAGNOSIS — E8721 Acute metabolic acidosis: Secondary | ICD-10-CM | POA: Diagnosis not present

## 2021-09-28 DIAGNOSIS — N170 Acute kidney failure with tubular necrosis: Secondary | ICD-10-CM | POA: Diagnosis not present

## 2021-09-28 DIAGNOSIS — D497 Neoplasm of unspecified behavior of endocrine glands and other parts of nervous system: Secondary | ICD-10-CM | POA: Diagnosis not present

## 2021-09-28 DIAGNOSIS — N189 Chronic kidney disease, unspecified: Secondary | ICD-10-CM | POA: Diagnosis not present

## 2021-09-29 ENCOUNTER — Other Ambulatory Visit: Payer: Self-pay | Admitting: Nephrology

## 2021-09-29 ENCOUNTER — Other Ambulatory Visit (HOSPITAL_COMMUNITY): Payer: Self-pay | Admitting: Nephrology

## 2021-09-29 DIAGNOSIS — N170 Acute kidney failure with tubular necrosis: Secondary | ICD-10-CM

## 2021-09-29 DIAGNOSIS — R1033 Periumbilical pain: Secondary | ICD-10-CM | POA: Diagnosis not present

## 2021-09-29 DIAGNOSIS — R809 Proteinuria, unspecified: Secondary | ICD-10-CM

## 2021-09-29 DIAGNOSIS — R262 Difficulty in walking, not elsewhere classified: Secondary | ICD-10-CM | POA: Diagnosis not present

## 2021-09-29 DIAGNOSIS — M6281 Muscle weakness (generalized): Secondary | ICD-10-CM | POA: Diagnosis not present

## 2021-09-29 DIAGNOSIS — N281 Cyst of kidney, acquired: Secondary | ICD-10-CM | POA: Diagnosis not present

## 2021-09-29 DIAGNOSIS — R278 Other lack of coordination: Secondary | ICD-10-CM | POA: Diagnosis not present

## 2021-09-29 DIAGNOSIS — D3610 Benign neoplasm of peripheral nerves and autonomic nervous system, unspecified: Secondary | ICD-10-CM

## 2021-09-29 DIAGNOSIS — E1122 Type 2 diabetes mellitus with diabetic chronic kidney disease: Secondary | ICD-10-CM

## 2021-09-29 DIAGNOSIS — S32592S Other specified fracture of left pubis, sequela: Secondary | ICD-10-CM | POA: Diagnosis not present

## 2021-09-29 DIAGNOSIS — N3289 Other specified disorders of bladder: Secondary | ICD-10-CM | POA: Diagnosis not present

## 2021-09-30 ENCOUNTER — Encounter (HOSPITAL_BASED_OUTPATIENT_CLINIC_OR_DEPARTMENT_OTHER): Payer: Medicare Other | Admitting: General Surgery

## 2021-09-30 DIAGNOSIS — S32592S Other specified fracture of left pubis, sequela: Secondary | ICD-10-CM | POA: Diagnosis not present

## 2021-09-30 DIAGNOSIS — R278 Other lack of coordination: Secondary | ICD-10-CM | POA: Diagnosis not present

## 2021-09-30 DIAGNOSIS — R262 Difficulty in walking, not elsewhere classified: Secondary | ICD-10-CM | POA: Diagnosis not present

## 2021-09-30 DIAGNOSIS — M6281 Muscle weakness (generalized): Secondary | ICD-10-CM | POA: Diagnosis not present

## 2021-10-03 DIAGNOSIS — S32592S Other specified fracture of left pubis, sequela: Secondary | ICD-10-CM | POA: Diagnosis not present

## 2021-10-03 DIAGNOSIS — R262 Difficulty in walking, not elsewhere classified: Secondary | ICD-10-CM | POA: Diagnosis not present

## 2021-10-03 DIAGNOSIS — R278 Other lack of coordination: Secondary | ICD-10-CM | POA: Diagnosis not present

## 2021-10-03 DIAGNOSIS — L89100 Pressure ulcer of unspecified part of back, unstageable: Secondary | ICD-10-CM | POA: Diagnosis not present

## 2021-10-03 DIAGNOSIS — E875 Hyperkalemia: Secondary | ICD-10-CM | POA: Diagnosis not present

## 2021-10-03 DIAGNOSIS — R52 Pain, unspecified: Secondary | ICD-10-CM | POA: Diagnosis not present

## 2021-10-03 DIAGNOSIS — M6281 Muscle weakness (generalized): Secondary | ICD-10-CM | POA: Diagnosis not present

## 2021-10-03 DIAGNOSIS — Z79899 Other long term (current) drug therapy: Secondary | ICD-10-CM | POA: Diagnosis not present

## 2021-10-04 DIAGNOSIS — R278 Other lack of coordination: Secondary | ICD-10-CM | POA: Diagnosis not present

## 2021-10-04 DIAGNOSIS — S32592S Other specified fracture of left pubis, sequela: Secondary | ICD-10-CM | POA: Diagnosis not present

## 2021-10-04 DIAGNOSIS — R262 Difficulty in walking, not elsewhere classified: Secondary | ICD-10-CM | POA: Diagnosis not present

## 2021-10-04 DIAGNOSIS — M6281 Muscle weakness (generalized): Secondary | ICD-10-CM | POA: Diagnosis not present

## 2021-10-04 DIAGNOSIS — E876 Hypokalemia: Secondary | ICD-10-CM | POA: Diagnosis not present

## 2021-10-05 DIAGNOSIS — I10 Essential (primary) hypertension: Secondary | ICD-10-CM | POA: Diagnosis not present

## 2021-10-05 DIAGNOSIS — N289 Disorder of kidney and ureter, unspecified: Secondary | ICD-10-CM | POA: Diagnosis not present

## 2021-10-05 DIAGNOSIS — S32592S Other specified fracture of left pubis, sequela: Secondary | ICD-10-CM | POA: Diagnosis not present

## 2021-10-05 DIAGNOSIS — R278 Other lack of coordination: Secondary | ICD-10-CM | POA: Diagnosis not present

## 2021-10-05 DIAGNOSIS — E119 Type 2 diabetes mellitus without complications: Secondary | ICD-10-CM | POA: Diagnosis not present

## 2021-10-05 DIAGNOSIS — R262 Difficulty in walking, not elsewhere classified: Secondary | ICD-10-CM | POA: Diagnosis not present

## 2021-10-05 DIAGNOSIS — L89100 Pressure ulcer of unspecified part of back, unstageable: Secondary | ICD-10-CM | POA: Diagnosis not present

## 2021-10-05 DIAGNOSIS — M6281 Muscle weakness (generalized): Secondary | ICD-10-CM | POA: Diagnosis not present

## 2021-10-06 DIAGNOSIS — M6281 Muscle weakness (generalized): Secondary | ICD-10-CM | POA: Diagnosis not present

## 2021-10-06 DIAGNOSIS — S32592S Other specified fracture of left pubis, sequela: Secondary | ICD-10-CM | POA: Diagnosis not present

## 2021-10-06 DIAGNOSIS — R1110 Vomiting, unspecified: Secondary | ICD-10-CM | POA: Diagnosis not present

## 2021-10-06 DIAGNOSIS — Z79899 Other long term (current) drug therapy: Secondary | ICD-10-CM | POA: Diagnosis not present

## 2021-10-06 DIAGNOSIS — R112 Nausea with vomiting, unspecified: Secondary | ICD-10-CM | POA: Diagnosis not present

## 2021-10-08 DIAGNOSIS — S32592S Other specified fracture of left pubis, sequela: Secondary | ICD-10-CM | POA: Diagnosis not present

## 2021-10-08 DIAGNOSIS — M6281 Muscle weakness (generalized): Secondary | ICD-10-CM | POA: Diagnosis not present

## 2021-10-10 DIAGNOSIS — M6281 Muscle weakness (generalized): Secondary | ICD-10-CM | POA: Diagnosis not present

## 2021-10-10 DIAGNOSIS — S32592S Other specified fracture of left pubis, sequela: Secondary | ICD-10-CM | POA: Diagnosis not present

## 2021-10-11 DIAGNOSIS — M6281 Muscle weakness (generalized): Secondary | ICD-10-CM | POA: Diagnosis not present

## 2021-10-11 DIAGNOSIS — S32592S Other specified fracture of left pubis, sequela: Secondary | ICD-10-CM | POA: Diagnosis not present

## 2021-10-12 ENCOUNTER — Other Ambulatory Visit (HOSPITAL_BASED_OUTPATIENT_CLINIC_OR_DEPARTMENT_OTHER): Payer: Self-pay | Admitting: General Surgery

## 2021-10-12 ENCOUNTER — Encounter (HOSPITAL_BASED_OUTPATIENT_CLINIC_OR_DEPARTMENT_OTHER): Payer: Medicare Other | Attending: General Surgery | Admitting: General Surgery

## 2021-10-12 DIAGNOSIS — I5042 Chronic combined systolic (congestive) and diastolic (congestive) heart failure: Secondary | ICD-10-CM | POA: Insufficient documentation

## 2021-10-12 DIAGNOSIS — L89104 Pressure ulcer of unspecified part of back, stage 4: Secondary | ICD-10-CM | POA: Diagnosis not present

## 2021-10-12 DIAGNOSIS — L89323 Pressure ulcer of left buttock, stage 3: Secondary | ICD-10-CM | POA: Diagnosis not present

## 2021-10-12 DIAGNOSIS — L89153 Pressure ulcer of sacral region, stage 3: Secondary | ICD-10-CM | POA: Diagnosis not present

## 2021-10-12 DIAGNOSIS — Z993 Dependence on wheelchair: Secondary | ICD-10-CM | POA: Diagnosis not present

## 2021-10-12 DIAGNOSIS — L89620 Pressure ulcer of left heel, unstageable: Secondary | ICD-10-CM | POA: Diagnosis not present

## 2021-10-12 DIAGNOSIS — Z9981 Dependence on supplemental oxygen: Secondary | ICD-10-CM | POA: Diagnosis not present

## 2021-10-12 DIAGNOSIS — I110 Hypertensive heart disease with heart failure: Secondary | ICD-10-CM | POA: Insufficient documentation

## 2021-10-12 DIAGNOSIS — S32592S Other specified fracture of left pubis, sequela: Secondary | ICD-10-CM | POA: Diagnosis not present

## 2021-10-12 DIAGNOSIS — E11622 Type 2 diabetes mellitus with other skin ulcer: Secondary | ICD-10-CM | POA: Diagnosis not present

## 2021-10-12 DIAGNOSIS — L89610 Pressure ulcer of right heel, unstageable: Secondary | ICD-10-CM | POA: Insufficient documentation

## 2021-10-12 DIAGNOSIS — I2510 Atherosclerotic heart disease of native coronary artery without angina pectoris: Secondary | ICD-10-CM | POA: Insufficient documentation

## 2021-10-12 DIAGNOSIS — M869 Osteomyelitis, unspecified: Secondary | ICD-10-CM | POA: Diagnosis not present

## 2021-10-12 DIAGNOSIS — M6281 Muscle weakness (generalized): Secondary | ICD-10-CM | POA: Diagnosis not present

## 2021-10-12 NOTE — Progress Notes (Signed)
[name] ([id number]) Visit Report for 10/12/2021 Chief Complaint Document Details Patient Name: Date of Service: [name], [place] 10/12/2021 9:00 [place] Record Number: [id number] Patient Account Number: [id number] Date of Birth/Sex: Treating RN: 03/07/1936 (86 y.o. [name] Primary Care Provider: Ronnie Underwood Other Clinician: Referring Provider: Treating Provider/Extender: Virgel Gess Weeks in Treatment: 0 Information Obtained from: Patient Chief Complaint Patient is at the clinic for treatment of open pressure ulcers x 4 Electronic Signature(s) Signed: 10/12/2021 12:26:32 PM By: Fredirick Maudlin MD FACS Entered By: Fredirick Maudlin on 10/12/2021 12:26:32 -------------------------------------------------------------------------------- Debridement Details Patient Name: Date of Service: [name]. 10/12/2021 9:00 [place] Record Number: [id number] Patient Account Number: [id number] Date of Birth/Sex: Treating RN: June 23, 1935 (86 y.o. [name] Primary Care Provider: Ronnie Underwood Other Clinician: Referring Provider: Treating Provider/Extender: Virgel Gess Weeks in Treatment: 0 Debridement Performed for Assessment: Wound #3 Back Performed By: Physician Fredirick Maudlin, MD Debridement Type: Debridement Level of Consciousness (Pre-procedure): Awake and Alert Pre-procedure Verification/Time Out Yes - 11:00 Taken: Start Time: 11:01 Pain Control: Other : benzocaine 20% spray T Area Debrided (L x W): otal 14.5 (cm) x 6.5 (cm) = 94.25 (cm) Tissue and other material debrided: Viable, Non-Viable, Bone, Eschar, Slough, Subcutaneous, Biofilm, Slough Level: Skin/Subcutaneous Tissue/Muscle/Bone Debridement Description: Excisional Instrument: Curette, Forceps, Rongeur, Scissors Specimen: Tissue Culture Number of Specimens T aken: 1 Bleeding: Minimum Hemostasis Achieved: Pressure Procedural Pain:  10 Post Procedural Pain: 9 Response to Treatment: Procedure was not tolerated well Comments: reapplied benzocaine Level of Consciousness (Post- Awake and Alert procedure): Post Debridement Measurements of Total Wound Length: (cm) 14.5 Stage: Category/Stage IV Width: (cm) 6.5 Depth: (cm) 0.7 Volume: (cm) 51.817 Character of Wound/Ulcer Post Debridement: Requires Further Debridement Post Procedure Diagnosis Same as Pre-procedure Electronic Signature(s) Signed: 10/12/2021 2:37:03 PM By: Fredirick Maudlin MD FACS Signed: 10/12/2021 5:06:14 PM By: Baruch Gouty RN, BSN Entered By: Baruch Gouty on 10/12/2021 11:09:54 -------------------------------------------------------------------------------- Debridement Details Patient Name: Date of Service: [name]. 10/12/2021 9:00 [place] Record Number: [id number] Patient Account Number: [id number] Date of Birth/Sex: Treating RN: September 03, 1935 (86 y.o. [name] Primary Care Provider: Ronnie Underwood Other Clinician: Referring Provider: Treating Provider/Extender: Virgel Gess Weeks in Treatment: 0 Debridement Performed for Assessment: Wound #4 Left Gluteus Performed By: Physician Fredirick Maudlin, MD Debridement Type: Debridement Level of Consciousness (Pre-procedure): Awake and Alert Pre-procedure Verification/Time Out Yes - 11:00 Taken: Start Time: 11:01 Pain Control: Other : benzocaine 20% spray T Area Debrided (L x W): otal 0.7 (cm) x 0.4 (cm) = 0.28 (cm) Tissue and other material debrided: Non-Viable, Slough, Slough Level: Non-Viable Tissue Debridement Description: Selective/Open Wound Instrument: Curette Bleeding: Minimum Hemostasis Achieved: Pressure Procedural Pain: 0 Post Procedural Pain: 0 Response to Treatment: Procedure was tolerated well Level of Consciousness (Post- Awake and Alert procedure): Post Debridement Measurements of Total Wound Length: (cm) 0.7 Stage:  Category/Stage III Width: (cm) 0.4 Depth: (cm) 0.1 Volume: (cm) 0.022 Character of Wound/Ulcer Post Debridement: Requires Further Debridement Post Procedure Diagnosis Same as Pre-procedure Electronic Signature(s) Signed: 10/12/2021 2:37:03 PM By: Fredirick Maudlin MD FACS Signed: 10/12/2021 5:06:14 PM By: Baruch Gouty RN, BSN Entered By: Baruch Gouty on 10/12/2021 11:12:54 -------------------------------------------------------------------------------- Debridement Details Patient Name: Date of Service: [name]. 10/12/2021 9:00 [place] Record Number: [id number] Patient Account Number: [id number] Date of Birth/Sex: Treating RN: 1935/06/13 (86 y.o. [name] Primary Care Provider: Other Clinician: Ronnie Underwood Referring Provider: Treating Provider/Extender: [name],  [name], [name] Weeks in Treatment: 0 Debridement Performed for Assessment: Wound #1 Left Calcaneus Performed By: Physician Fredirick Maudlin, MD Debridement Type: Debridement Severity of Tissue Pre Debridement: Fat layer exposed Level of Consciousness (Pre-procedure): Awake and Alert Pre-procedure Verification/Time Out Yes - 11:00 Taken: Start Time: 11:01 Pain Control: Other : benzocaine 20% spray T Area Debrided (L x W): otal 1 (cm) x 1.8 (cm) = 1.8 (cm) Tissue and other material debrided: Viable, Non-Viable, Eschar, Fat, Subcutaneous Level: Skin/Subcutaneous Tissue Debridement Description: Excisional Instrument: Curette Bleeding: Minimum Hemostasis Achieved: Pressure Procedural Pain: 5 Post Procedural Pain: 3 Response to Treatment: Procedure was tolerated well Level of Consciousness (Post- Awake and Alert procedure): Post Debridement Measurements of Total Wound Length: (cm) 1 Stage: Unstageable/Unclassified Width: (cm) 1.8 Depth: (cm) 0.1 Volume: (cm) 0.141 Character of Wound/Ulcer Post Debridement: Requires Further Debridement Severity of Tissue Post  Debridement: Fat layer exposed Post Procedure Diagnosis Same as Pre-procedure Electronic Signature(s) Signed: 10/12/2021 2:37:03 PM By: Fredirick Maudlin MD FACS Signed: 10/12/2021 5:06:14 PM By: Baruch Gouty RN, BSN Entered By: Baruch Gouty on 10/12/2021 11:14:16 -------------------------------------------------------------------------------- Debridement Details Patient Name: Date of Service: [name]. 10/12/2021 9:00 [place] Record Number: [id number] Patient Account Number: [id number] Date of Birth/Sex: Treating RN: 07-Nov-1935 (86 y.o. [name] Primary Care Provider: Ronnie Underwood Other Clinician: Referring Provider: Treating Provider/Extender: Virgel Gess Weeks in Treatment: 0 Debridement Performed for Assessment: Wound #2 Right Calcaneus Performed By: Physician Fredirick Maudlin, MD Debridement Type: Debridement Severity of Tissue Pre Debridement: Fat layer exposed Level of Consciousness (Pre-procedure): Awake and Alert Pre-procedure Verification/Time Out Yes - 11:00 Taken: Start Time: 11:01 Pain Control: Other : benzocaine 20% spray T Area Debrided (L x W): otal 0.8 (cm) x 0.8 (cm) = 0.64 (cm) Tissue and other material debrided: Non-Viable, Eschar, Slough, Slough Level: Non-Viable Tissue Debridement Description: Selective/Open Wound Instrument: Curette Bleeding: Minimum Hemostasis Achieved: Pressure Procedural Pain: 5 Post Procedural Pain: 4 Response to Treatment: Procedure was tolerated well Level of Consciousness (Post- Awake and Alert procedure): Post Debridement Measurements of Total Wound Length: (cm) 0.8 Stage: Unstageable/Unclassified Width: (cm) 0.8 Depth: (cm) 0.1 Volume: (cm) 0.05 Character of Wound/Ulcer Post Debridement: Requires Further Debridement Severity of Tissue Post Debridement: Fat layer exposed Post Procedure Diagnosis Same as Pre-procedure Electronic Signature(s) Signed: 10/12/2021 2:37:03  PM By: Fredirick Maudlin MD FACS Signed: 10/12/2021 5:06:14 PM By: Baruch Gouty RN, BSN Entered By: Baruch Gouty on 10/12/2021 11:15:49 -------------------------------------------------------------------------------- HPI Details Patient Name: Date of Service: Blanchard Kelch C. 10/12/2021 9:00 [place] Record Number: [id number] Patient Account Number: [id number] Date of Birth/Sex: Treating RN: May 23, 1935 (86 y.o. [name] Primary Care Provider: Ronnie Underwood Other Clinician: Referring Provider: Treating Provider/Extender: Virgel Gess Weeks in Treatment: 0 History of Present Illness HPI Description: ADMISSION 10/12/2021 This is an 86 year old woman with a past medical history significant for type 2 diabetes mellitus (last hemoglobin A1c on August 03, 2021 was 7.7), congestive heart failure dependent on 2 L of oxygen via nasal cannula, pulmonary hypertension, systemic hypertension, and coronary artery disease. In March of this year, she suffered a fall that resulted in a nondisplaced pubic ramus fracture and a intertrochanteric hip fracture. After discharge from the hospital following surgery, she was sent to [facility] skilled nursing facility. According to the patient's sister who is with her today, they did not turn her and she ended up developing a pressure ulcer on her back (she has severe kyphosis) as well as on both of her  heels and gluteus. Due to their distance pleasure with the facility, the patient was transferred to [facility]. She was initiated on Augmentin and they have been applying Santyl to her buttock, Skin-Prep to both heels, and Santyl with Hydrofera Blue to her back. She is currently nonambulatory. The patient's sister reports that she is on a low-air-loss mattress. Her oral intake is poor. Nursing facility is trying to get her protein intake up with Pro-Stat, but the patient does not like the taste of it. She also has oral ulcers and  what looks like thrush. She is receiving oral nystatin suspension for this, but it does not seem to be helping. Her heel ulcers are unstageable, covered with black eschar. The ulcer on her left gluteus is small, stage III, and with little bit of slough accumulation. The wound on her back is extensive with exposed bone and substantial undermining. There is a flap of necrotic tissue hanging from the most cranial portion of the wound. No significant odor but there is substantial slough accumulation. Electronic Signature(s) Signed: 10/12/2021 12:39:25 PM By: Fredirick Maudlin MD FACS Entered By: Fredirick Maudlin on 10/12/2021 12:39:25 -------------------------------------------------------------------------------- Physical Exam Details Patient Name: Date of Service: [name]. 10/12/2021 9:00 [place] Record Number: [id number] Patient Account Number: [id number] Date of Birth/Sex: Treating RN: 1936/03/03 (86 y.o. [name] Primary Care Provider: Ronnie Underwood Other Clinician: Referring Provider: Treating Provider/Extender: Virgel Gess Weeks in Treatment: 0 Constitutional . . . . Chronically ill-appearing, but in no acute distress.Marland Kitchen Respiratory Normal work of breathing on supplemental oxygen. Notes 10/12/2021: Her heel ulcers are unstageable, covered with black eschar. The ulcer on her left gluteus is small, stage III, and with little bit of slough accumulation. The wound on her back is extensive with exposed bone and substantial undermining. There is a flap of necrotic tissue hanging from the most cranial portion of the wound. No significant odor but there is substantial slough accumulation. Electronic Signature(s) Signed: 10/12/2021 12:45:55 PM By: Fredirick Maudlin MD FACS Entered By: Fredirick Maudlin on 10/12/2021 12:45:55 -------------------------------------------------------------------------------- Physician Orders Details Patient Name: Date of  Service: [name]. 10/12/2021 9:00 [place] Record Number: [id number] Patient Account Number: [id number] Date of Birth/Sex: Treating RN: 1935/10/27 (86 y.o. [name] Primary Care Provider: Ronnie Underwood Other Clinician: Referring Provider: Treating Provider/Extender: Virgel Gess Weeks in Treatment: 0 Verbal / Phone Orders: No Diagnosis Coding ICD-10 Coding Code Description E11.69 Type 2 diabetes mellitus with other specified complication I96.78 Atherosclerotic heart disease of native coronary artery without angina pectoris I50.42 Chronic combined systolic (congestive) and diastolic (congestive) heart failure Follow-up Appointments ppointment in 1 week. - Dr. Lucienne Capers with [facility] Return A Friday 6/16 @ 10:30 am Bathing/ Shower/ Hygiene May shower with protection but do not get wound dressing(s) wet. Off-Loading Turn and reposition every 2 hours - avoid any direct pressure to the back Additional Orders / Instructions Follow Nutritious Diet - protein supplements Wound Treatment Wound #1 - Calcaneus Wound Laterality: Left Prim Dressing: Hydrofera Blue Ready Foam, 2.5 x2.5 in Every Other Day ary Discharge Instructions: Apply to wound bed as instructed Secondary Dressing: ALLEVYN Heel 4 1/2in x 5 1/2in / 10.5cm x 13.5cm Every Other Day Discharge Instructions: Apply over primary dressing as directed. Secondary Dressing: Woven Gauze Sponge, Non-Sterile 4x4 in Every Other Day Discharge Instructions: Apply over primary dressing as directed. Secured With: [employer], 4.5x3.1 (in/yd) Every Other Day Discharge Instructions: Secure with Kerlix as directed. Secured  With: Paper Tape, 2x10 (in/yd) Every Other Day Discharge Instructions: Secure dressing with tape as directed. Wound #2 - Calcaneus Wound Laterality: Right Prim Dressing: Hydrofera Blue Ready Foam, 2.5 x2.5 in ary Every Other Day Discharge Instructions: Apply to wound bed as  instructed Secondary Dressing: ALLEVYN Heel 4 1/2in x 5 1/2in / 10.5cm x 13.5cm Every Other Day Discharge Instructions: Apply over primary dressing as directed. Secondary Dressing: Woven Gauze Sponge, Non-Sterile 4x4 in Every Other Day Discharge Instructions: Apply over primary dressing as directed. Secured With: [employer], 4.5x3.1 (in/yd) Every Other Day Discharge Instructions: Secure with Kerlix as directed. Secured With: Paper Tape, 2x10 (in/yd) Every Other Day Discharge Instructions: Secure dressing with tape as directed. Wound #3 - Back Prim Dressing: Hydrofera Blue Ready Foam, 2.5 x2.5 in 1 x Per Day/30 Days ary Discharge Instructions: Apply to wound bed as instructed Prim Dressing: Santyl Ointment 1 x Per Day/30 Days ary Discharge Instructions: Apply nickel thick amount to wound bed as instructed Secondary Dressing: ComfortFoam Border, 6x8 in (silicone border) 1 x Per Day/30 Days Discharge Instructions: Apply over primary dressing as directed. Wound #4 - Gluteus Wound Laterality: Left Prim Dressing: Hydrofera Blue Ready Foam, 2.5 x2.5 in Every Other Day ary Discharge Instructions: Apply to wound bed as instructed Secondary Dressing: ComfortFoam Border, 4x4 in (silicone border) Every Other Day Discharge Instructions: Apply over primary dressing as directed. Laboratory naerobe culture (MICRO) Bacteria identified in Unspecified specimen by A LOINC Code: 448-1 Convenience Name: Anaerobic culture Bacteria identified in Tissue by Biopsy culture (MICRO) - back LOINC Code: [id number] Convenience Name: Biopsy specimen culture Patient Medications llergies: codeine A Notifications Medication Indication Start End prior to debridement 10/12/2021 benzocaine DOSE topical 20 % aerosol - aerosol topical Electronic Signature(s) Signed: 10/12/2021 2:37:03 PM By: Fredirick Maudlin MD FACS Entered By: Fredirick Maudlin on 10/12/2021  12:49:14 -------------------------------------------------------------------------------- Problem List Details Patient Name: Date of Service: [name]. 10/12/2021 9:00 [place] Record Number: [id number] Patient Account Number: [id number] Date of Birth/Sex: Treating RN: 1936/01/26 (86 y.o. [name] Primary Care Provider: Ronnie Underwood Other Clinician: Referring Provider: Treating Provider/Extender: Virgel Gess Weeks in Treatment: 0 Active Problems ICD-10 Encounter Code Description Active Date MDM Diagnosis L89.104 Pressure ulcer of unspecified part of back, stage 4 10/12/2021 No Yes L89.620 Pressure ulcer of left heel, unstageable 10/12/2021 No Yes L89.610 Pressure ulcer of right heel, unstageable 10/12/2021 No Yes L89.153 Pressure ulcer of sacral region, stage 3 10/12/2021 No Yes E11.69 Type 2 diabetes mellitus with other specified complication 12/13/5025 No Yes I25.10 Atherosclerotic heart disease of native coronary artery without angina pectoris 10/12/2021 No Yes I50.42 Chronic combined systolic (congestive) and diastolic (congestive) heart failure 10/12/2021 No Yes Inactive Problems Resolved Problems Electronic Signature(s) Signed: 10/12/2021 12:25:48 PM By: Fredirick Maudlin MD FACS Previous Signature: 10/12/2021 10:34:09 AM Version By: Fredirick Maudlin MD FACS Entered By: Fredirick Maudlin on 10/12/2021 12:25:48 -------------------------------------------------------------------------------- Progress Note Details Patient Name: Date of Service: [name]. 10/12/2021 9:00 [place] Record Number: [id number] Patient Account Number: [id number] Date of Birth/Sex: Treating RN: 03/29/1936 (86 y.o. [name] Primary Care Provider: Ronnie Underwood Other Clinician: Referring Provider: Treating Provider/Extender: Virgel Gess Weeks in Treatment: 0 Subjective Chief Complaint Information obtained from  Patient Patient is at the clinic for treatment of open pressure ulcers x 4 History of Present Illness (HPI) ADMISSION 10/12/2021 This is an 85 year old woman with a past medical history significant for type 2 diabetes mellitus (last hemoglobin A1c on  August 03, 2021 was 7.7), congestive heart failure dependent on 2 L of oxygen via nasal cannula, pulmonary hypertension, systemic hypertension, and coronary artery disease. In March of this year, she suffered a fall that resulted in a nondisplaced pubic ramus fracture and a intertrochanteric hip fracture. After discharge from the hospital following surgery, she was sent to [facility] skilled nursing facility. According to the patient's sister who is with her today, they did not turn her and she ended up developing a pressure ulcer on her back (she has severe kyphosis) as well as on both of her heels and gluteus. Due to their distance pleasure with the facility, the patient was transferred to [facility]. She was initiated on Augmentin and they have been applying Santyl to her buttock, Skin-Prep to both heels, and Santyl with Hydrofera Blue to her back. She is currently nonambulatory. The patient's sister reports that she is on a low-air-loss mattress. Her oral intake is poor. Nursing facility is trying to get her protein intake up with Pro-Stat, but the patient does not like the taste of it. She also has oral ulcers and what looks like thrush. She is receiving oral nystatin suspension for this, but it does not seem to be helping. Her heel ulcers are unstageable, covered with black eschar. The ulcer on her left gluteus is small, stage III, and with little bit of slough accumulation. The wound on her back is extensive with exposed bone and substantial undermining. There is a flap of necrotic tissue hanging from the most cranial portion of the wound. No significant odor but there is substantial slough accumulation. Patient History Information obtained from  Patient, Caregiver, Chart. Allergies codeine (Severity: Moderate, Reaction: nausea) Family History Diabetes - Mother,Father,Siblings, Heart Disease - Mother,Father, Hypertension - Mother,Father, Kidney Disease - Mother,Father, Stroke - Father, Tuberculosis - Siblings, No family history of Cancer, Hereditary Spherocytosis, Lung Disease, Seizures, Thyroid Problems. Social History Never smoker, Marital Status - Widowed, Alcohol Use - Never, Drug Use - No History, Caffeine Use - Moderate - coffee, tea. Medical History Eyes Patient has history of Cataracts - bil removed, Glaucoma Denies history of Optic Neuritis Cardiovascular Patient has history of Congestive Heart Failure, Hypertension Endocrine Patient has history of Type II Diabetes Denies history of Type I Diabetes Genitourinary Denies history of End Stage Renal Disease Integumentary (Skin) Denies history of History of Burn Musculoskeletal Patient has history of Osteoarthritis Oncologic Denies history of Received Chemotherapy, Received Radiation Psychiatric Patient has history of Anorexia/bulimia, Confinement Anxiety Patient is treated with Oral Agents. Blood sugar is tested. Hospitalization/Surgery History - abdominal hysterectoy. - breast biopsy. - left breast milk duct surgery. - cataract surgery. - left hip fx nailing. - right hip surgery. - ORIF bil radial fractures. Medical A Surgical History Notes nd Ear/Nose/Mouth/Throat hard of hearing Respiratory O2 dependent Gastrointestinal GERD Genitourinary CKD Musculoskeletal DDD Neurologic wheelchair bound Review of Systems (ROS) Constitutional Symptoms (General Health) Complains or has symptoms of Fatigue. Denies complaints or symptoms of Fever, Chills, Marked Weight Change. Eyes Complains or has symptoms of Glasses / Contacts - reading. Ear/Nose/Mouth/Throat Denies complaints or symptoms of Chronic sinus problems or rhinitis. Respiratory Complains or has  symptoms of Shortness of Breath - with exertion. Denies complaints or symptoms of Chronic or frequent coughs. Cardiovascular Denies complaints or symptoms of Chest pain. Gastrointestinal Complains or has symptoms of Nausea. Denies complaints or symptoms of Frequent diarrhea, Vomiting. Endocrine Denies complaints or symptoms of Heat/cold intolerance. Genitourinary Denies complaints or symptoms of Frequent urination. Integumentary (Skin) Complains or  has symptoms of Wounds - bil heels, back. Musculoskeletal Complains or has symptoms of Muscle Weakness. Neurologic Denies complaints or symptoms of Numbness/parasthesias. Psychiatric Complains or has symptoms of Claustrophobia. Denies complaints or symptoms of Suicidal. Objective Constitutional Chronically ill-appearing, but in no acute distress.. Vitals Time Taken: 9:41 AM, Height: 48 in, Weight: 125 lbs, BMI: 38.1, Temperature: 97.7 F, Pulse: 73 bpm, Respiratory Rate: 18 breaths/min, Blood Pressure: 130/63 mmHg. General Notes: blood sugars monitored at facility Respiratory Normal work of breathing on supplemental oxygen. General Notes: 10/12/2021: Her heel ulcers are unstageable, covered with black eschar. The ulcer on her left gluteus is small, stage III, and with little bit of slough accumulation. The wound on her back is extensive with exposed bone and substantial undermining. There is a flap of necrotic tissue hanging from the most cranial portion of the wound. No significant odor but there is substantial slough accumulation. Integumentary (Hair, Skin) Wound #1 status is Open. Original cause of wound was Pressure Injury. The date acquired was: 08/06/2021. The wound is located on the Left Calcaneus. The wound measures 1cm length x 1.8cm width x 0.1cm depth; 1.414cm^2 area and 0.141cm^3 volume. There is Fat Layer (Subcutaneous Tissue) exposed. There is no tunneling or undermining noted. There is a none present amount of drainage noted.  The wound margin is flat and intact. There is no granulation within the wound bed. There is a large (67-100%) amount of necrotic tissue within the wound bed including Eschar. Wound #2 status is Open. Original cause of wound was Pressure Injury. The date acquired was: 08/06/2021. The wound is located on the Right Calcaneus. The wound measures 0.8cm length x 0.8cm width x 0.1cm depth; 0.503cm^2 area and 0.05cm^3 volume. There is no tunneling or undermining noted. There is a none present amount of drainage noted. The wound margin is flat and intact. There is no granulation within the wound bed. There is a large (67-100%) amount of necrotic tissue within the wound bed including Eschar. Wound #3 status is Open. Original cause of wound was Pressure Injury. The date acquired was: 08/06/2021. The wound is located on the Back. The wound measures 14.5cm length x 6.5cm width x 0.7cm depth; 74.024cm^2 area and 51.817cm^3 volume. There is bone, muscle, and Fat Layer (Subcutaneous Tissue) exposed. There is no tunneling noted, however, there is undermining starting at 12:00 and ending at 5:00 with a maximum distance of 3cm. There is a large amount of serosanguineous drainage noted. The wound margin is well defined and not attached to the wound base. There is large (67-100%) red granulation within the wound bed. There is a small (1-33%) amount of necrotic tissue within the wound bed including Eschar, Adherent Slough and Necrosis of Muscle. Wound #4 status is Open. Original cause of wound was Pressure Injury. The date acquired was: 08/06/2021. The wound is located on the Left Gluteus. The wound measures 0.7cm length x 0.4cm width x 0.1cm depth; 0.22cm^2 area and 0.022cm^3 volume. There is Fat Layer (Subcutaneous Tissue) exposed. There is no tunneling or undermining noted. There is a small amount of serous drainage noted. The wound margin is flat and intact. There is medium (34-66%) pink granulation within the wound bed.  There is a medium (34-66%) amount of necrotic tissue within the wound bed including Adherent Slough. 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 There was a Excisional Skin/Subcutaneous Tissue Debridement with a total area of 1.8 sq cm performed by Fredirick Maudlin, MD. With the following instrument(s): Curette to remove Viable and Non-Viable tissue/material. Material removed includes Fat, Eschar, and Subcutaneous Tissue after achieving pain control using Other (benzocaine 20% spray). No specimens were taken. A time out was conducted at 11:00, prior to the start of the procedure. A Minimum amount of bleeding was controlled with Pressure. The procedure was tolerated well with a pain level of 5 throughout and a pain level of 3 following the procedure. Post Debridement Measurements: 1cm length x 1.8cm width x 0.1cm depth; 0.141cm^3 volume. Post debridement Stage noted as Unstageable/Unclassified. Character of Wound/Ulcer Post Debridement requires further debridement. Severity of Tissue Post Debridement is: Fat layer exposed. Post procedure Diagnosis Wound #1: Same as Pre-Procedure Wound #2 Pre-procedure diagnosis of Wound #2 is a Pressure Ulcer located on the Right Calcaneus .Severity of Tissue Pre Debridement is: Fat layer exposed. There was a Selective/Open Wound Non-Viable Tissue Debridement with a total area of 0.64 sq cm performed by Fredirick Maudlin,  MD. With the following instrument(s): Curette to remove Non-Viable tissue/material. Material removed includes Eschar and Slough and after achieving pain control using Other (benzocaine 20% spray). No specimens were taken. A time out was conducted at 11:00, prior to the start of the procedure. A Minimum amount of bleeding was controlled with Pressure. The procedure was tolerated well with a pain level of 5 throughout and a pain level of 4 following the procedure. Post Debridement Measurements: 0.8cm length x 0.8cm width x 0.1cm depth; 0.05cm^3 volume. Post debridement Stage noted as Unstageable/Unclassified. Character of Wound/Ulcer Post Debridement requires further debridement. Severity of Tissue Post Debridement is: Fat layer exposed. Post procedure Diagnosis Wound #2: Same as Pre-Procedure Wound #3 Pre-procedure diagnosis of Wound #3 is a Pressure Ulcer located on the Back . There was a Excisional Skin/Subcutaneous Tissue/Muscle/Bone Debridement with a total area of 94.25 sq cm performed by Fredirick Maudlin, MD. With the following instrument(s): Curette, Forceps, Rongeur, and Scissors to remove Viable and Non-Viable tissue/material. Material removed includes Bone,Eschar, Subcutaneous Tissue, Slough, and Biofilm after achieving pain control using Other (benzocaine 20% spray). 1 specimen was taken by a Tissue Culture and sent to the lab per facility protocol. A time out was conducted at 11:00, prior to the start of the procedure. A Minimum amount of bleeding was controlled with Pressure. The procedure was not tolerated well with a pain level of 10 throughout and a pain level of 9 following the procedure. Response to Treatment Comments: reapplied benzocaine. Post Debridement Measurements: 14.5cm length x 6.5cm width x 0.7cm depth; 51.817cm^3 volume. Post debridement Stage noted as Category/Stage IV. Character of Wound/Ulcer Post Debridement requires further debridement. Post procedure Diagnosis Wound  #3: Same as Pre-Procedure Wound #4 Pre-procedure diagnosis of Wound #4 is a Pressure Ulcer located on the Left Gluteus . There was a Selective/Open Wound Non-Viable Tissue Debridement with a total area of 0.28 sq cm performed by Fredirick Maudlin, MD. With the following instrument(s): Curette to remove Non-Viable tissue/material. Material removed includes [facility] after achieving pain control using Other (benzocaine 20% spray). No specimens were taken. A time out was conducted at 11:00, prior to the start of  the procedure. A Minimum amount of bleeding was controlled with Pressure. The procedure was tolerated well with a pain level of 0 throughout and a pain level of 0 following the procedure. Post Debridement Measurements: 0.7cm length x 0.4cm width x 0.1cm depth; 0.022cm^3 volume. Post debridement Stage noted as Category/Stage III. Character of Wound/Ulcer Post Debridement requires further debridement. Post procedure Diagnosis Wound #4: Same as Pre-Procedure Plan Follow-up Appointments: Return Appointment in 1 week. - Dr. Lucienne Capers with [facility] Friday 6/16 @ 10:30 am Bathing/ Shower/ Hygiene: May shower with protection but do not get wound dressing(s) wet. Off-Loading: Turn and reposition every 2 hours - avoid any direct pressure to the back Additional Orders / Instructions: Follow Nutritious Diet - protein supplements Laboratory ordered were: Anaerobic culture, Biopsy specimen culture - back The following medication(s) was prescribed: benzocaine topical 20 % aerosol aerosol topical for prior to debridement was prescribed at facility WOUND #1: - Calcaneus Wound Laterality: Left Prim Dressing: Hydrofera Blue Ready Foam, 2.5 x2.5 in Every Other Day/ ary Discharge Instructions: Apply to wound bed as instructed Secondary Dressing: ALLEVYN Heel 4 1/2in x 5 1/2in / 10.5cm x 13.5cm Every Other Day/ Discharge Instructions: Apply over primary dressing as directed. Secondary Dressing: Woven Gauze  Sponge, Non-Sterile 4x4 in Every Other Day/ Discharge Instructions: Apply over primary dressing as directed. Secured With: [employer], 4.5x3.1 (in/yd) Every Other Day/ Discharge Instructions: Secure with Kerlix as directed. Secured With: Paper T ape, 2x10 (in/yd) Every Other Day/ Discharge Instructions: Secure dressing with tape as directed. WOUND #2: - Calcaneus Wound Laterality: Right Prim Dressing: Hydrofera Blue Ready Foam, 2.5 x2.5 in Every Other Day/ ary Discharge Instructions: Apply to wound bed as instructed Secondary Dressing: ALLEVYN Heel 4 1/2in x 5 1/2in / 10.5cm x 13.5cm Every Other Day/ Discharge Instructions: Apply over primary dressing as directed. Secondary Dressing: Woven Gauze Sponge, Non-Sterile 4x4 in Every Other Day/ Discharge Instructions: Apply over primary dressing as directed. Secured With: [employer], 4.5x3.1 (in/yd) Every Other Day/ Discharge Instructions: Secure with Kerlix as directed. Secured With: Paper T ape, 2x10 (in/yd) Every Other Day/ Discharge Instructions: Secure dressing with tape as directed. WOUND #3: - Back Wound Laterality: Prim Dressing: Hydrofera Blue Ready Foam, 2.5 x2.5 in 1 x Per Day/30 Days ary Discharge Instructions: Apply to wound bed as instructed Prim Dressing: Santyl Ointment 1 x Per Day/30 Days ary Discharge Instructions: Apply nickel thick amount to wound bed as instructed Secondary Dressing: ComfortFoam Border, 6x8 in (silicone border) 1 x Per Day/30 Days Discharge Instructions: Apply over primary dressing as directed. WOUND #4: - Gluteus Wound Laterality: Left Prim Dressing: Hydrofera Blue Ready Foam, 2.5 x2.5 in Every Other Day/ ary Discharge Instructions: Apply to wound bed as instructed Secondary Dressing: ComfortFoam Border, 4x4 in (silicone border) Every Other Day/ Discharge Instructions: Apply over primary dressing as directed. 10/12/2021: This is an 86 year old diabetic with pulmonary artery  hypertension, CHF, and coronary artery disease who had a hip fracture earlier this year. She developed pressure injuries during her convalescence. Her heel ulcers are unstageable, covered with black eschar. The ulcer on her left gluteus is small, stage III, and with little bit of slough accumulation. The wound on her back is extensive with exposed bone and substantial undermining. There is a flap of necrotic tissue hanging from the most cranial portion of the wound. No significant odor but there is substantial slough accumulation. I used tissue scissors and forceps to remove the flap of dead tissue from her back wound. I  then used a curette to remove slough and biofilm from the wound surface. I took a biopsy of the exposed bone to evaluate for osteomyelitis. I also took a PCR culture in anticipation of potential [place] topical antibiotic therapy. I think she will ultimately benefit from a wound VAC at this site, but the wound is not yet clean enough for that. I debrided the eschar off of both heels; the fat layer is involved and I debrided necrotic fat and slough from both of these wounds. I also used a curette to debride the slough from her buttock ulcer. I agree with using Santyl to the back wound. Continue Hydrofera Blue. Hydrofera Blue to the other open areas, as well. She needs aggressive protein supplementation, offloading of all involved sites including floating her heels by what ever means necessary and keeping her off of her back wound. She is already getting nystatin for her oral ulcers; I do not have anything else to offer her for this condition. She should continue taking her Augmentin. Once I have her culture data back, I will tailor her antibiotic therapy appropriately. Follow-up in 1 week. Electronic Signature(s) Signed: 10/12/2021 12:53:20 PM By: Fredirick Maudlin MD FACS Entered By: Fredirick Maudlin on 10/12/2021  12:53:20 -------------------------------------------------------------------------------- HxROS Details Patient Name: Date of Service: Ferdinand Lango, Jerrell Belfast C. 10/12/2021 9:00 [place] Record Number: [id number] Patient Account Number: [id number] Date of Birth/Sex: Treating RN: [birth year] (86 y.o. [name] Primary Care Provider: Ronnie Underwood Other Clinician: Referring Provider: Treating Provider/Extender: Virgel Gess Weeks in Treatment: 0 Information Obtained From Patient Caregiver Chart Constitutional Symptoms (General Health) Complaints and Symptoms: Positive for: Fatigue Negative for: Fever; Chills; Marked Weight Change Eyes Complaints and Symptoms: Positive for: Glasses / Contacts - reading Medical History: Positive for: Cataracts - bil removed; Glaucoma Negative for: Optic Neuritis Ear/Nose/Mouth/Throat Complaints and Symptoms: Negative for: Chronic sinus problems or rhinitis Medical History: Past Medical History Notes: hard of hearing Respiratory Complaints and Symptoms: Positive for: Shortness of Breath - with exertion Negative for: Chronic or frequent coughs Medical History: Past Medical History Notes: O2 dependent Cardiovascular Complaints and Symptoms: Negative for: Chest pain Medical History: Positive for: Congestive Heart Failure; Hypertension Gastrointestinal Complaints and Symptoms: Positive for: Nausea Negative for: Frequent diarrhea; Vomiting Medical History: Past Medical History Notes: GERD Endocrine Complaints and Symptoms: Negative for: Heat/cold intolerance Medical History: Positive for: Type II Diabetes Negative for: Type I Diabetes Time with diabetes: >20 yrs Treated with: Oral agents Blood sugar tested every day: Yes Tested : daiily Genitourinary Complaints and Symptoms: Negative for: Frequent urination Medical History: Negative for: End Stage Renal Disease Past Medical History  Notes: CKD Integumentary (Skin) Complaints and Symptoms: Positive for: Wounds - bil heels, back Medical History: Negative for: History of Burn Musculoskeletal Complaints and Symptoms: Positive for: Muscle Weakness Medical History: Positive for: Osteoarthritis Past Medical History Notes: DDD Neurologic Complaints and Symptoms: Negative for: Numbness/parasthesias Medical History: Past Medical History Notes: wheelchair bound Psychiatric Complaints and Symptoms: Positive for: Claustrophobia Negative for: Suicidal Medical History: Positive for: Anorexia/bulimia; Confinement Anxiety Hematologic/Lymphatic Immunological Oncologic Medical History: Negative for: Received Chemotherapy; Received Radiation HBO Extended History Items Eyes: Eyes: Cataracts Glaucoma Immunizations Pneumococcal Vaccine: Received Pneumococcal Vaccination: Yes Received Pneumococcal Vaccination On or After 60th Birthday: Yes Implantable Devices None Hospitalization / Surgery History Type of Hospitalization/Surgery abdominal hysterectoy breast biopsy left breast milk duct surgery cataract surgery left hip fx nailing right hip surgery ORIF bil radial fractures Family and Social History Cancer: No; Diabetes: Yes - Mother,Father,Siblings;  Heart Disease: Yes - Mother,Father; Hereditary Spherocytosis: No; Hypertension: Yes - Mother,Father; Kidney Disease: Yes - Mother,Father; Lung Disease: No; Seizures: No; Stroke: Yes - Father; Thyroid Problems: No; Tuberculosis: Yes - Siblings; Never smoker; Marital Status - Widowed; Alcohol Use: Never; Drug Use: No History; Caffeine Use: Moderate - coffee, tea; Financial Concerns: No; Food, Clothing or Shelter Needs: No; Support System Lacking: No; Transportation Concerns: No Engineer, maintenance) Signed: 10/12/2021 2:37:03 PM By: Fredirick Maudlin MD FACS Signed: 10/12/2021 5:06:14 PM By: Baruch Gouty RN, BSN Entered By: Baruch Gouty on 10/12/2021  09:56:03 -------------------------------------------------------------------------------- [place] Details Patient Name: Date of Service: [name] 10/12/2021 Medical Record Number: [id number] Patient Account Number: [id number] Date of Birth/Sex: Treating RN: [birth year] (86 y.o. [name] Primary Care Provider: Ronnie Underwood Other Clinician: Referring Provider: Treating Provider/Extender: Virgel Gess Weeks in Treatment: 0 Diagnosis Coding ICD-10 Codes Code Description L89.104 Pressure ulcer of unspecified part of back, stage 4 L89.620 Pressure ulcer of left heel, unstageable L89.610 Pressure ulcer of right heel, unstageable L89.153 Pressure ulcer of sacral region, stage 3 E11.69 Type 2 diabetes mellitus with other specified complication Q67.61 Atherosclerotic heart disease of native coronary artery without angina pectoris I50.42 Chronic combined systolic (congestive) and diastolic (congestive) heart failure Facility Procedures CPT4 Code: 95093267 Description: [place] VISIT-LEV 3 EST PT Modifier: 25 Quantity: 1 CPT4 Code: [id number] Description: 11042 - DEB SUBQ TISSUE 20 SQ CM/< ICD-10 Diagnosis Description L89.104 Pressure ulcer of unspecified part of back, stage 4 L89.620 Pressure ulcer of left heel, unstageable L89.610 Pressure ulcer of right heel, unstageable Modifier: Quantity: 1 CPT4 Code: [id number] [id number] 1 I Description: 11044 - DEB BONE 20 SQ CM/< ICD-10 Diagnosis Description L89.104 Pressure ulcer of unspecified part of back, stage 4 1047 - DEB BONE EA ADDL 20 SQ CM/< CD-10 Diagnosis Description L89.104 Pressure ulcer of unspecified part of back, stage 4 Modifier: Quantity: 1 4 CPT4 Code: [id number] 9 I Description: 7597 - DEBRIDE WOUND 1ST 20 SQ CM OR < CD-10 Diagnosis Description L89.153 Pressure ulcer of sacral region, stage 3 Modifier: Quantity: 1 Physician Procedures CPT4: Description Modifier Code [id number]  92426 - WC PHYS LEVEL 4 - NEW PT 25 ICD-10 Diagnosis Description L89.104 Pressure ulcer of unspecified part of back, stage 4 L89.620 Pressure ulcer of left heel, unstageable L89.610 Pressure ulcer of right heel,  unstageable L89.153 Pressure ulcer of sacral region, stage 3 Quantity: 1 CPT4: 8341962 11042 - WC PHYS SUBQ TISS 20 SQ CM ICD-10 Diagnosis Description L89.104 Pressure ulcer of unspecified part of back, stage 4 L89.620 Pressure ulcer of left heel, unstageable L89.610 Pressure ulcer of right heel, unstageable Quantity: 1 CPT4: 2297989 Debridement; bone (includes epidermis, dermis, subQ tissue, muscle and/or fascia, if performed) 1st 20 sqcm or less ICD-10 Diagnosis Description L89.104 Pressure ulcer of unspecified part of back, stage 4 Quantity: 1 CPT4: 2119417 Excisional Debridement, Bone (includes epidermis, dermis, subcutaneous tissue, muscle and/or fascia, if performed); each additional 20 sqcm, or part thereof ICD-10 Diagnosis Description L89.104 Pressure ulcer of unspecified part of back,  stage 4 Quantity: 4 CPT4: 4081448 97597 - WC PHYS DEBR WO ANESTH 20 SQ CM ICD-10 Diagnosis Description L89.153 Pressure ulcer of sacral region, stage 3 Quantity: 1 Electronic Signature(s) Signed: 10/12/2021 2:37:03 PM By: Fredirick Maudlin MD FACS Signed: 10/12/2021 5:06:14 PM By: Baruch Gouty RN, BSN Previous Signature: 10/12/2021 12:54:06 PM Version By: Fredirick Maudlin MD FACS Entered By: Baruch Gouty on 10/12/2021 13:46:46

## 2021-10-12 NOTE — Progress Notes (Addendum)
Kelly, Underwood ([id number]) Visit Report for 10/12/2021 Allergy List Details Patient Name: Date of Service: [place], [place] 10/12/2021 9:00 [place] Record Number: [id number] Patient Account Number: [id number] Date of Birth/Sex: Treating RN: [birth year] (86 y.o. [name] Primary Care Devin Ganaway: [name] Other Clinician: Referring John Williamsen: Treating Kayde Atkerson/Extender: Luna Glasgow, Leatrice Jewels Weeks in Treatment: 0 Allergies Active Allergies codeine Reaction: nausea Severity: Moderate Allergy Notes Electronic Signature(s) Signed: 10/12/2021 5:06:14 PM By: Baruch Gouty RN, BSN Entered By: Baruch Gouty on 10/12/2021 09:44:19 -------------------------------------------------------------------------------- Arrival Information Details Patient Name: Date of Service: Kelly Penna. 10/12/2021 9:00 [place] Record Number: [id number] Patient Account Number: [id number] Date of Birth/Sex: Treating RN: November 13, 1935 (86 y.o. [name] Primary Care Joylyn Duggin: [name] Other Clinician: Referring Pennelope Basque: Treating Eugune Sine/Extender: [name] in Treatment: 0 Visit Information Patient Arrived: Wheel Chair Arrival Time: 09:24 Accompanied By: sister Transfer Assistance: Manual Patient Identification Verified: Yes Secondary Verification Process Completed: Yes Patient Requires Transmission-Based Precautions: No Patient Has Alerts: Yes Patient Alerts: DNR R ABI N/C Electronic Signature(s) Signed: 10/12/2021 5:06:14 PM By: Baruch Gouty RN, BSN Entered By: Baruch Gouty on 10/12/2021 13:46:22 -------------------------------------------------------------------------------- Clinic Level of Care Assessment Details Patient Name: Date of Service: [place], [place]. 10/12/2021 9:00 [place] Record Number: [id number] Patient Account Number: [id number] Date of Birth/Sex: Treating RN: 1936-03-16 (86 y.o. [name] Primary Care Roshawna Colclasure: [name] Other Clinician: Referring Jyssica Rief: Treating Mariyanna Mucha/Extender: Virgel Gess Weeks in Treatment: 0 Clinic Level of Care Assessment Items TOOL 1 Quantity Score '[]'$  - 0 Use when EandM and Procedure is performed on INITIAL visit ASSESSMENTS - Nursing Assessment / Reassessment X- 1 20 General Physical Exam (combine w/ comprehensive assessment (listed just below) when performed on new pt. evals) X- 1 25 Comprehensive Assessment (HX, ROS, Risk Assessments, Wounds Hx, etc.) ASSESSMENTS - Wound and Skin Assessment / Reassessment '[]'$  - 0 Dermatologic / Skin Assessment (not related to wound area) ASSESSMENTS - Ostomy and/or Continence Assessment and Care '[]'$  - 0 Incontinence Assessment and Management '[]'$  - 0 Ostomy Care Assessment and Management (repouching, etc.) PROCESS - Coordination of Care X - Simple Patient / Family Education for ongoing care 1 15 '[]'$  - 0 Complex (extensive) Patient / Family Education for ongoing care X- 1 10 Staff obtains Programmer, systems, Records, T Results / Process Orders est X- 1 10 Staff telephones HHA, Nursing Homes / Clarify orders / etc '[]'$  - 0 Routine Transfer to another Facility (non-emergent condition) '[]'$  - 0 Routine Hospital Admission (non-emergent condition) X- 1 15 New Admissions / Biomedical engineer / Ordering NPWT Apligraf, etc. , '[]'$  - 0 Emergency Hospital Admission (emergent condition) PROCESS - Special Needs '[]'$  - 0 Pediatric / Minor Patient Management '[]'$  - 0 Isolation Patient Management '[]'$  - 0 Hearing / Language / Visual special needs '[]'$  - 0 Assessment of Community assistance (transportation, D/C planning, etc.) '[]'$  - 0 Additional assistance / Altered mentation '[]'$  - 0 Support Surface(s) Assessment (bed, cushion, seat, etc.) INTERVENTIONS - Miscellaneous '[]'$  - 0 External ear exam '[]'$  - 0 Patient Transfer (multiple staff / Civil Service fast streamer / Similar devices) '[]'$  - 0 Simple  Staple / Suture removal (25 or less) '[]'$  - 0 Complex Staple / Suture removal (26 or more) '[]'$  - 0 Hypo/Hyperglycemic Management (do not check if billed separately) X- 1 15 Ankle / Brachial Index (ABI) - do not check if billed separately Has the patient been seen at the hospital within the last  three years: Yes Total Score: 110 Level Of Care: New/Established - Level 3 Electronic Signature(s) Signed: 10/12/2021 5:06:14 PM By: Baruch Gouty RN, BSN Signed: 10/12/2021 5:06:14 PM By: Baruch Gouty RN, BSN Entered By: Baruch Gouty on 10/12/2021 13:45:40 -------------------------------------------------------------------------------- Encounter Discharge Information Details Patient Name: Date of Service: [place], [place]. 10/12/2021 9:00 [place] Record Number: [id number] Patient Account Number: [id number] Date of Birth/Sex: Treating RN: May 11, 1935 (86 y.o. [name] Primary Care Quinnetta Roepke: [name] Other Clinician: Referring Malaijah Houchen: Treating Angeliyah Kirkey/Extender: Virgel Gess Weeks in Treatment: 0 Encounter Discharge Information Items Post Procedure Vitals Discharge Condition: Stable Temperature (F): 97.7 Ambulatory Status: Wheelchair Pulse (bpm): 73 Discharge Destination: [place] Respiratory Rate (breaths/min): 18 Telephoned: No Blood Pressure (mmHg): 130/63 Orders Sent: Yes Transportation: Other Accompanied By: sister Schedule Follow-up Appointment: Yes Clinical Summary of Care: Patient Declined Notes facility transportation Electronic Signature(s) Signed: 10/12/2021 5:06:14 PM By: Baruch Gouty RN, BSN Entered By: Baruch Gouty on 10/12/2021 13:48:07 -------------------------------------------------------------------------------- Lower Extremity Assessment Details Patient Name: Date of Service: [place], [place]. 10/12/2021 9:00 [place] Record Number: [id number] Patient Account Number: [id number] Date of Birth/Sex:  Treating RN: Sep 10, 1935 (86 y.o. [name] Primary Care Collins Dimaria: [name] Other Clinician: Referring Braeley Buskey: Treating Leviticus Harton/Extender: Virgel Gess Weeks in Treatment: 0 Edema Assessment Assessed: [Left: No] [Right: No] Edema: [Left: No] [Right: No] Vascular Assessment Pulses: Dorsalis Pedis Palpable: [Left:Yes] [Right:Yes] Blood Pressure: Brachial: [Left:130] [Right:130] Ankle: [Left:Dorsalis Pedis: 156 1.20] Notes right DP pulse noncompressible Electronic Signature(s) Signed: 10/12/2021 5:06:14 PM By: Baruch Gouty RN, BSN Entered By: Baruch Gouty on 10/12/2021 10:08:25 -------------------------------------------------------------------------------- Multi Wound Chart Details Patient Name: Date of Service: Kelly Penna. 10/12/2021 9:00 [place] Record Number: [id number] Patient Account Number: [id number] Date of Birth/Sex: Treating RN: May 07, 1936 (86 y.o. [name] Primary Care Eirene Rather: [name] Other Clinician: Referring Nyomie Ehrlich: Treating Dante Cooter/Extender: Virgel Gess Weeks in Treatment: 0 Vital Signs Height(in): 48 Pulse(bpm): 57 Weight(lbs): 125 Blood Pressure(mmHg): 130/63 Body Mass Index(BMI): 38.1 Temperature(F): 97.7 Respiratory Rate(breaths/min): 18 Photos: Left Calcaneus Right Calcaneus Back Wound Location: Pressure Injury Pressure Injury Pressure Injury Wounding Event: Pressure Ulcer Pressure Ulcer Pressure Ulcer Primary Etiology: Diabetic Wound/Ulcer of the Lower Diabetic Wound/Ulcer of the Lower N/A Secondary Etiology: Extremity Extremity Cataracts, Glaucoma, Congestive Cataracts, Glaucoma, Congestive Cataracts, Glaucoma, Congestive Comorbid History: Heart Failure, Hypertension, Type II Heart Failure, Hypertension, Type II Heart Failure, Hypertension, Type II Diabetes, Osteoarthritis, Diabetes, Osteoarthritis, Diabetes,  Osteoarthritis, Anorexia/bulimia, Confinement Anxiety Anorexia/bulimia, Confinement Anxiety Anorexia/bulimia, Confinement Anxiety 08/06/2021 08/06/2021 08/06/2021 Date Acquired: 0 0 0 Weeks of Treatment: Open Open Open Wound Status: No No No Wound Recurrence: 1x1.8x0.1 0.8x0.8x0.1 14.5x6.5x0.7 Measurements L x W x D (cm) 1.414 0.503 74.024 A (cm) : rea 0.141 0.05 51.817 Volume (cm) : 0.00% 0.00% 0.00% % Reduction in A rea: 0.00% 0.00% 0.00% % Reduction in Volume: 12 Starting Position 1 (o'clock): 5 Ending Position 1 (o'clock): 3 Maximum Distance 1 (cm): No No Yes Undermining: Unstageable/Unclassified Unstageable/Unclassified Category/Stage IV Classification: None Present None Present Large Exudate A mount: N/A N/A Serosanguineous Exudate Type: N/A N/A red, brown Exudate Color: Flat and Intact Flat and Intact Well defined, not attached Wound Margin: None Present (0%) None Present (0%) Large (67-100%) Granulation A mount: N/A N/A Red Granulation Quality: Large (67-100%) Large (67-100%) Small (1-33%) Necrotic A mount: Eschar Eschar Eschar, Adherent Slough Necrotic Tissue: Fat Layer (Subcutaneous Tissue): Yes Fascia: No Fat Layer (Subcutaneous Tissue): Yes Exposed Structures: Fascia: No Fat Layer (  Subcutaneous Tissue): No Muscle: Yes Tendon: No Tendon: No Bone: Yes Muscle: No Muscle: No Fascia: No Joint: No Joint: No Tendon: No Bone: No Bone: No Joint: No Small (1-33%) Large (67-100%) Small (1-33%) Epithelialization: Debridement - Excisional Debridement - Selective/Open Wound Debridement - Excisional Debridement: Pre-procedure Verification/Time Out 11:00 11:00 11:00 Taken: Other Other Other Pain Control: Necrotic/Eschar, Fat, Subcutaneous Necrotic/Eschar, Psychologist, prison and probation services, Bone, Subcutaneous, Tissue Debrided: Slough Skin/Subcutaneous Tissue Non-Viable Tissue Skin/Subcutaneous Level: Tissue/Muscle/Bone 1.8 0.64 94.25 Debridement A (sq  cm): rea Curette Curette Curette, Forceps, [place], Scissors Instrument: Minimum Minimum Minimum Bleeding: Pressure Pressure Pressure Hemostasis Achieved: '5 5 10 '$ Procedural Pain: '3 4 9 '$ Post Procedural Pain: Procedure was tolerated well Procedure was tolerated well Procedure was not tolerated well Debridement Treatment Response: N/A N/A reapplied benzocaine Response Comments: 1x1.8x0.1 0.8x0.8x0.1 14.5x6.5x0.7 Post Debridement Measurements L x W x D (cm) 0.141 0.05 51.817 Post Debridement Volume: (cm) Unstageable/Unclassified Unstageable/Unclassified Category/Stage IV Post Debridement Stage: Debridement Debridement Debridement Procedures Performed: Wound Number: 4 N/A N/A Photos: N/A N/A Left Gluteus N/A N/A Wound Location: Pressure Injury N/A N/A Wounding Event: Pressure Ulcer N/A N/A Primary Etiology: N/A N/A N/A Secondary Etiology: Cataracts, Glaucoma, Congestive N/A N/A Comorbid History: Heart Failure, Hypertension, Type II Diabetes, Osteoarthritis, Anorexia/bulimia, Confinement Anxiety 08/06/2021 N/A N/A Date Acquired: 0 N/A N/A Weeks of Treatment: Open N/A N/A Wound Status: No N/A N/A Wound Recurrence: 0.7x0.4x0.1 N/A N/A Measurements L x W x D (cm) 0.22 N/A N/A A (cm) : rea 0.022 N/A N/A Volume (cm) : 0.00% N/A N/A % Reduction in A rea: 0.00% N/A N/A % Reduction in Volume: No N/A N/A Undermining: Category/Stage III N/A N/A Classification: Small N/A N/A Exudate A mount: Serous N/A N/A Exudate Type: amber N/A N/A Exudate Color: Flat and Intact N/A N/A Wound Margin: Medium (34-66%) N/A N/A Granulation A mount: Pink N/A N/A Granulation Quality: Medium (34-66%) N/A N/A Necrotic A mount: Adherent Slough N/A N/A Necrotic Tissue: Fat Layer (Subcutaneous Tissue): Yes N/A N/A Exposed Structures: Fascia: No Tendon: No Muscle: No Joint: No Bone: No Small (1-33%) N/A N/A Epithelialization: Debridement - Selective/Open Wound N/A  N/A Debridement: Pre-procedure Verification/Time Out 11:00 N/A N/A Taken: Other N/A N/A Pain Control: Slough N/A N/A Tissue Debrided: Non-Viable Tissue N/A N/A Level: 0.28 N/A N/A Debridement A (sq cm): rea Curette N/A N/A Instrument: Minimum N/A N/A Bleeding: Pressure N/A N/A Hemostasis A chieved: 0 N/A N/A Procedural Pain: 0 N/A N/A Post Procedural Pain: Procedure was tolerated well N/A N/A Debridement Treatment Response: N/A N/A N/A Response Comments: 0.7x0.4x0.1 N/A N/A Post Debridement Measurements L x W x D (cm) 0.022 N/A N/A Post Debridement Volume: (cm) Category/Stage III N/A N/A Post Debridement Stage: Debridement N/A N/A Procedures Performed: Treatment Notes Electronic Signature(s) Signed: 10/12/2021 12:26:13 PM By: Fredirick Maudlin MD FACS Signed: 10/12/2021 5:06:14 PM By: Baruch Gouty RN, BSN Entered By: Fredirick Maudlin on 10/12/2021 12:26:13 -------------------------------------------------------------------------------- Multi-Disciplinary Care Plan Details Patient Name: Date of Service: [place], [place]. 10/12/2021 9:00 [place] Record Number: [id number] Patient Account Number: [id number] Date of Birth/Sex: Treating RN: 24-Sep-1935 (86 y.o. [name] Primary Care Alok Minshall: [name] Other Clinician: Referring Rollie Hynek: Treating Delila Kuklinski/Extender: Virgel Gess Weeks in Treatment: 0 Multidisciplinary Care Plan reviewed with physician Active Inactive Electronic Signature(s) Signed: 11/16/2021 10:33:22 AM By: Baruch Gouty RN, BSN Previous Signature: 10/12/2021 5:06:14 PM Version By: Baruch Gouty RN, BSN Entered By: Baruch Gouty on 11/01/2021 11:35:54 -------------------------------------------------------------------------------- Pain Assessment Details Patient Name: Date of Service: Kelly Lango, Kelly RY C. 10/12/2021 9:00 [facility] Record Number: [id number] Patient Account Number: [id number] Date of  Birth/Sex: Treating RN: 08-09-1935 (86 y.o. [name] Primary Care Kensi Karr: [name] Other Clinician: Referring Oyinkansola Truax: Treating Stephanieann Popescu/Extender: Virgel Gess Weeks in Treatment: 0 Active Problems Location of Pain Severity and Description of Pain Patient Has Paino Yes Site Locations Pain Location: Pain in Ulcers With Dressing Change: Yes Duration of the Pain. Constant / Intermittento Constant Rate the pain. Current Pain Level: 10 Worst Pain Level: 10 Least Pain Level: 8 Character of Pain Describe the Pain: Aching, Tender Pain Management and Medication Current Pain Management: Medication: Yes Other: reposition Is the Current Pain Management Adequate: Adequate How does your wound impact your activities of daily livingo Sleep: No Bathing: No Appetite: No Relationship With Others: No Bladder Continence: No Emotions: Yes Bowel Continence: No Work: No Toileting: No Drive: No Dressing: No Hobbies: Astronomer) Signed: 10/12/2021 5:06:14 PM By: Baruch Gouty RN, BSN Entered By: Baruch Gouty on 10/12/2021 10:30:13 -------------------------------------------------------------------------------- Patient/Caregiver Education Details Patient Name: Date of Service: [place], Kelly RY C. 6/7/2023andnbsp9:00 [place] Record Number: [id number] Patient Account Number: [id number] Date of Birth/Gender: Treating RN: 1935-07-16 (86 y.o. [name] Primary Care Physician: [name] Other Clinician: Referring Physician: Treating Physician/Extender: [name] in Treatment: 0 Education Assessment Education Provided To: Patient Education Topics Provided Pressure: Methods: Explain/Verbal Responses: Reinforcements needed, State content correctly Wound Debridement: Methods: Explain/Verbal Responses: Reinforcements needed, State content correctly Wound/Skin  Impairment: Methods: Explain/Verbal Responses: Reinforcements needed, State content correctly Electronic Signature(s) Signed: 10/12/2021 5:06:14 PM By: Baruch Gouty RN, BSN Entered By: Baruch Gouty on 10/12/2021 13:44:27 -------------------------------------------------------------------------------- Wound Assessment Details Patient Name: Date of Service: Kelly Penna. 10/12/2021 9:00 [place] Record Number: [id number] Patient Account Number: [id number] Date of Birth/Sex: Treating RN: 04-12-1936 (86 y.o. [name] Primary Care Jamarii Banks: [name] Other Clinician: Referring Kayte Borchard: Treating Jazlynn Nemetz/Extender: Virgel Gess Weeks in Treatment: 0 Wound Status Wound Number: 1 Primary Pressure Ulcer Etiology: Wound Location: Left Calcaneus Secondary Diabetic Wound/Ulcer of the Lower Extremity Wounding Event: Pressure Injury Etiology: Date Acquired: 08/06/2021 Wound Open Weeks Of Treatment: 0 Status: Clustered Wound: No Comorbid Cataracts, Glaucoma, Congestive Heart Failure, Hypertension, History: Type II Diabetes, Osteoarthritis, Anorexia/bulimia, Confinement Anxiety Photos Wound Measurements Length: (cm) 1 Width: (cm) 1.8 Depth: (cm) 0.1 Area: (cm) 1.414 Volume: (cm) 0.141 % Reduction in Area: 0% % Reduction in Volume: 0% Epithelialization: Small (1-33%) Tunneling: No Undermining: No Wound Description Classification: Unstageable/Unclassified Wound Margin: Flat and Intact Exudate Amount: None Present Foul Odor After Cleansing: No Slough/Fibrino No Wound Bed Granulation Amount: None Present (0%) Exposed Structure Necrotic Amount: Large (67-100%) Fascia Exposed: No Necrotic Quality: Eschar Fat Layer (Subcutaneous Tissue) Exposed: Yes Tendon Exposed: No Muscle Exposed: No Joint Exposed: No Bone Exposed: No Electronic Signature(s) Signed: 10/12/2021 5:06:14 PM By: Baruch Gouty RN, BSN Entered By: Baruch Gouty  on 10/12/2021 10:43:52 -------------------------------------------------------------------------------- Wound Assessment Details Patient Name: Date of Service: Kelly Penna. 10/12/2021 9:00 [place] Record Number: [id number] Patient Account Number: [id number] Date of Birth/Sex: Treating RN: 1935/06/22 (86 y.o. [name] Primary Care Daschel Roughton: [name] Other Clinician: Referring Pariss Hommes: Treating Lyndsy Gilberto/Extender: Virgel Gess Weeks in Treatment: 0 Wound Status Wound Number: 2 Primary Pressure Ulcer Etiology: Etiology: Wound Location: Right Calcaneus Secondary Diabetic Wound/Ulcer of the Lower Extremity Wounding Event: Pressure Injury Etiology: Date Acquired: 08/06/2021 Wound Open Weeks Of Treatment: 0 Status: Clustered Wound: No Comorbid Cataracts, Glaucoma, Congestive Heart Failure,  Hypertension, History: Type II Diabetes, Osteoarthritis, Anorexia/bulimia, Confinement Anxiety Photos Wound Measurements Length: (cm) 0.8 Width: (cm) 0.8 Depth: (cm) 0.1 Area: (cm) 0.503 Volume: (cm) 0.05 % Reduction in Area: 0% % Reduction in Volume: 0% Epithelialization: Large (67-100%) Tunneling: No Undermining: No Wound Description Classification: Unstageable/Unclassified Wound Margin: Flat and Intact Exudate Amount: None Present Foul Odor After Cleansing: No Slough/Fibrino No Wound Bed Granulation Amount: None Present (0%) Exposed Structure Necrotic Amount: Large (67-100%) Fascia Exposed: No Necrotic Quality: Eschar Fat Layer (Subcutaneous Tissue) Exposed: No Tendon Exposed: No Muscle Exposed: No Joint Exposed: No Bone Exposed: No Electronic Signature(s) Signed: 10/12/2021 5:06:14 PM By: Baruch Gouty RN, BSN Entered By: Baruch Gouty on 10/12/2021 10:44:14 -------------------------------------------------------------------------------- Wound Assessment Details Patient Name: Date of Service: Kelly Penna. 10/12/2021  9:00 [facility] Record Number: [id number] Patient Account Number: [id number] Date of Birth/Sex: Treating RN: April 10, 1936 (86 y.o. [name] Primary Care Hyder Deman: [name] Other Clinician: Referring Renell Allum: Treating Deamber Buckhalter/Extender: Virgel Gess Weeks in Treatment: 0 Wound Status Wound Number: 3 Primary Pressure Ulcer Etiology: Wound Location: Back Wound Open Wounding Event: Pressure Injury Status: Date Acquired: 08/06/2021 Comorbid Cataracts, Glaucoma, Congestive Heart Failure, Hypertension, Weeks Of Treatment: 0 History: Type II Diabetes, Osteoarthritis, Anorexia/bulimia, Confinement Clustered Wound: No Anxiety Photos Wound Measurements Length: (cm) 14.5 Width: (cm) 6.5 Depth: (cm) 0.7 Area: (cm) 74.024 Volume: (cm) 51.817 % Reduction in Area: 0% % Reduction in Volume: 0% Epithelialization: Small (1-33%) Tunneling: No Undermining: Yes Starting Position (o'clock): 12 Ending Position (o'clock): 5 Maximum Distance: (cm) 3 Wound Description Classification: Category/Stage IV Wound Margin: Well defined, not attached Exudate Amount: Large Exudate Type: Serosanguineous Exudate Color: red, brown Foul Odor After Cleansing: No Slough/Fibrino Yes Wound Bed Granulation Amount: Large (67-100%) Exposed Structure Granulation Quality: Red Fascia Exposed: No Necrotic Amount: Small (1-33%) Fat Layer (Subcutaneous Tissue) Exposed: Yes Necrotic Quality: Eschar, Adherent Slough Tendon Exposed: No Muscle Exposed: Yes Necrosis of Muscle: Yes Joint Exposed: No Bone Exposed: Yes Electronic Signature(s) Signed: 10/12/2021 5:06:14 PM By: Baruch Gouty RN, BSN Entered By: Baruch Gouty on 10/12/2021 10:46:25 -------------------------------------------------------------------------------- Wound Assessment Details Patient Name: Date of Service: Kelly Penna. 10/12/2021 9:00 [place] Record Number: [id number] Patient Account Number:  [id number] Date of Birth/Sex: Treating RN: [birth year] (86 y.o. [name] Primary Care Deigo Alonso: [name] Other Clinician: Referring Fisher Hargadon: Treating Reece Mcbroom/Extender: Virgel Gess Weeks in Treatment: 0 Wound Status Wound Number: 4 Primary Pressure Ulcer Etiology: Wound Location: Left Gluteus Wound Open Wounding Event: Pressure Injury Status: Date Acquired: 08/06/2021 Comorbid Cataracts, Glaucoma, Congestive Heart Failure, Hypertension, Weeks Of Treatment: 0 History: Type II Diabetes, Osteoarthritis, Anorexia/bulimia, Confinement Clustered Wound: No Anxiety Photos Wound Measurements Length: (cm) 0.7 Width: (cm) 0.4 Depth: (cm) 0.1 Area: (cm) 0.22 Volume: (cm) 0.022 % Reduction in Area: 0% % Reduction in Volume: 0% Epithelialization: Small (1-33%) Tunneling: No Undermining: No Wound Description Classification: Category/Stage III Wound Margin: Flat and Intact Exudate Amount: Small Exudate Type: Serous Exudate Color: amber Foul Odor After Cleansing: No Slough/Fibrino Yes Wound Bed Granulation Amount: Medium (34-66%) Exposed Structure Granulation Quality: Pink Fascia Exposed: No Necrotic Amount: Medium (34-66%) Fat Layer (Subcutaneous Tissue) Exposed: Yes Necrotic Quality: Adherent Slough Tendon Exposed: No Muscle Exposed: No Joint Exposed: No Bone Exposed: No Electronic Signature(s) Signed: 10/12/2021 5:06:14 PM By: Baruch Gouty RN, BSN Entered By: Baruch Gouty on 10/12/2021 10:46:51 -------------------------------------------------------------------------------- Vitals Details Patient Name: Date of Service: Kelly Kelch C. 10/12/2021 9:00 A M Medical Record Number: [id number] Patient Account Number:  [id number] Date of Birth/Sex: Treating RN: 03-Jul-1935 (86 y.o. [name] Primary Care Tayli Buch: [name] Other Clinician: Referring Jatavion Peaster: Treating Sary Bogie/Extender: Virgel Gess Weeks in Treatment: 0 Vital Signs Time Taken: 09:41 Temperature (F): 97.7 Height (in): 48 Pulse (bpm): 73 Weight (lbs): 125 Respiratory Rate (breaths/min): 18 Body Mass Index (BMI): 38.1 Blood Pressure (mmHg): 130/63 Reference Range: 80 - 120 mg / dl Notes blood sugars monitored at facility Electronic Signature(s) Signed: 10/12/2021 5:06:14 PM By: Baruch Gouty RN, BSN Entered By: Baruch Gouty on 10/12/2021 09:43:30

## 2021-10-12 NOTE — Progress Notes (Signed)
[name], [name] ([id number]) Visit Report for 10/12/2021 Abuse Risk Screen Details Patient Name: Date of Service: [place], [place] 10/12/2021 9:00 [name] Record Number: [id number] Patient Account Number: [id number] Date of Birth/Sex: Treating RN: 26-Kelly-Underwood (86 y.o. [name] Primary Care [name]: [name] Other Clinician: Referring [name]: Treating [name]/Extender: [name] Weeks in Treatment: 0 Abuse Risk Screen Items Answer ABUSE RISK SCREEN: Has anyone close to you tried to hurt or harm you recentlyo No Do you feel uncomfortable with anyone in your familyo No Has anyone forced you do things that you didnt want to doo No Electronic Signature(s) Signed: 10/12/2021 5:06:14 PM By: Baruch Gouty RN, BSN Entered By: Baruch Gouty on 10/12/2021 09:56:14 -------------------------------------------------------------------------------- Activities of Daily Living Details Patient Name: Date of Service: [place], [place] 10/12/2021 9:00 [place] Record Number: [id number] Patient Account Number: [id number] Date of Birth/Sex: Treating RN: [name] (86 y.o. [name] Primary Care Diar Berkel: [name] Other Clinician: Referring [name]: Treating [name]/Extender: [name] Weeks in Treatment: 0 Activities of Daily Living Items Answer Activities of Daily Living (Please select one for each item) Drive Automobile Not Able T Medications ake Need Assistance Use T elephone Completely Able Care for Appearance Need Assistance Use T oilet Need Assistance Bath / Shower Need Assistance Dress Self Need Assistance Feed Self Completely Able Walk Not Able Get In / Out Bed Need Assistance Housework Not Able Prepare Meals Not Able Handle Money Need Assistance Shop for Self Not Able Electronic Signature(s) Signed: 10/12/2021 5:06:14 PM By: Baruch Gouty RN, BSN Entered By: Baruch Gouty on  10/12/2021 09:56:42 -------------------------------------------------------------------------------- Education Screening Details Patient Name: Date of Service: [place]. 10/12/2021 9:00 [name] Record Number: [id number] Patient Account Number: [id number] Date of Birth/Sex: Treating RN: [name] (86 y.o. [name] Primary Care Tekla Malachowski: [name] Other Clinician: Referring Deshea Pooley: Treating [name]: [name] in Treatment: 0 Primary Learner Assessed: Patient Learning Preferences/Education Level/Primary Language Learning Preference: Explanation, Demonstration, Printed Material Highest Education Level: High School Preferred Language: English Cognitive Barrier Language Barrier: No Translator Needed: No Memory Deficit: No Emotional Barrier: No Cultural/Religious Beliefs Affecting Medical Care: No Physical Barrier Impaired Vision: Yes Glasses Impaired Hearing: Yes hard of hearing Decreased Hand dexterity: No Knowledge/Comprehension Knowledge Level: High Comprehension Level: High Ability to understand written instructions: High Ability to understand verbal instructions: High Motivation Anxiety Level: Calm Cooperation: Cooperative Education Importance: Acknowledges Need Interest in Health Problems: Asks Questions Perception: Coherent Willingness to Engage in Self-Management High Activities: Readiness to Engage in Self-Management High Activities: Electronic Signature(s) Signed: 10/12/2021 5:06:14 PM By: Baruch Gouty RN, BSN Entered By: Baruch Gouty on 10/12/2021 09:57:27 -------------------------------------------------------------------------------- Fall Risk Assessment Details Patient Name: Date of Service: [name], [name]. 10/12/2021 9:00 [name] Record Number: [id number] Patient Account Number: [id number] Date of Birth/Sex: Treating RN: [birth year] Underwood (85 y.o. [name] Primary Care  Sandee Bernath: [name] Other Clinician: Referring [name]: Treating [name]/Extender: [name] Weeks in Treatment: 0 Fall Risk Assessment Items Have you had 2 or more falls in the last 12 monthso 0 No Have you had any fall that resulted in injury in the last 12 monthso 0 Yes FALLS RISK SCREEN History of falling - immediate or within 3 months 25 Yes Secondary diagnosis (Do you have 2 or more medical diagnoseso) 0 No Ambulatory aid None/bed rest/wheelchair/nurse 0 Yes Crutches/cane/walker 0 No Furniture 0 No Intravenous therapy Access/Saline/Heparin Lock 0 No Gait/Transferring Normal/ bed  rest/ wheelchair 0 Yes Weak (short steps with or without shuffle, stooped but able to lift head while walking, may seek 0 No support from furniture) Impaired (short steps with shuffle, may have difficulty arising from chair, head down, impaired 0 No balance) Mental Status Oriented to own ability 0 Yes Electronic Signature(s) Signed: 10/12/2021 5:06:14 PM By: Baruch Gouty RN, BSN Entered By: Baruch Gouty on 10/12/2021 09:57:51 -------------------------------------------------------------------------------- Foot Assessment Details Patient Name: Date of Service: [place]. 10/12/2021 9:00 [name] Record Number: [id number] Patient Account Number: [id number] Date of Birth/Sex: Treating RN: [name] (86 y.o. [name] Primary Care Koby Pickup: [name] Other Clinician: Referring Valentin Benney: Treating [name]/Extender: [name] Weeks in Treatment: 0 Foot Assessment Items Site Locations + = Sensation present, - = Sensation absent, C = Callus, U = Ulcer R = Redness, W = Warmth, M = Maceration, PU = Pre-ulcerative lesion F = Fissure, S = Swelling, D = Dryness Assessment Right: Left: Other Deformity: No No Prior Foot Ulcer: No No Prior Amputation: No No Charcot Joint: No No Ambulatory Status:  Non-ambulatory Assistance Device: Wheelchair Gait: Engineer, maintenance) Signed: 10/12/2021 5:06:14 PM By: Baruch Gouty RN, BSN Entered By: Baruch Gouty on 10/12/2021 10:01:33 -------------------------------------------------------------------------------- Nutrition Risk Screening Details Patient Name: Date of Service: Kelly [place]na. 10/12/2021 9:00 [place] Record Number: [id number] Patient Account Number: [id number] Date of Birth/Sex: Treating RN: [birth year] (86 y.o. [name] Primary Care Dael Howland: [name] Other Clinician: Referring Burna Atlas: Treating Clive Parcel/Extender: [name] Weeks in Treatment: 0 Height (in): 48 Weight (lbs): 125 Body Mass Index (BMI): 38.1 Nutrition Risk Screening Items Score Screening NUTRITION RISK SCREEN: I have an illness or condition that made me change the kind and/or amount of food I eat 2 Yes I eat fewer than two meals per day 3 Yes I eat few fruits and vegetables, or milk products 0 No I have three or more drinks of beer, liquor or wine almost every day 0 No I have tooth or mouth problems that make it hard for me to eat 0 No I don't always have enough money to buy the food I need 0 No I eat alone most of the time 0 No I take three or more different prescribed or over-the-counter drugs a day 1 Yes Without wanting to, I have lost or gained 10 pounds in the last six months 2 Yes I am not always physically able to shop, cook and/or feed myself 0 No Nutrition Protocols Good Risk Protocol Moderate Risk Protocol High Risk Proctocol 0 Provide education on nutrition Risk Level: High Risk Score: 8 Electronic Signature(s) Signed: 10/12/2021 5:06:14 PM By: Baruch Gouty RN, BSN Entered By: Baruch Gouty on 10/12/2021 09:58:10

## 2021-10-13 DIAGNOSIS — M6281 Muscle weakness (generalized): Secondary | ICD-10-CM | POA: Diagnosis not present

## 2021-10-13 DIAGNOSIS — S32592S Other specified fracture of left pubis, sequela: Secondary | ICD-10-CM | POA: Diagnosis not present

## 2021-10-14 DIAGNOSIS — M6281 Muscle weakness (generalized): Secondary | ICD-10-CM | POA: Diagnosis not present

## 2021-10-14 DIAGNOSIS — S32592S Other specified fracture of left pubis, sequela: Secondary | ICD-10-CM | POA: Diagnosis not present

## 2021-10-17 DIAGNOSIS — Z79899 Other long term (current) drug therapy: Secondary | ICD-10-CM | POA: Diagnosis not present

## 2021-10-17 DIAGNOSIS — K120 Recurrent oral aphthae: Secondary | ICD-10-CM | POA: Diagnosis not present

## 2021-10-17 DIAGNOSIS — S32592S Other specified fracture of left pubis, sequela: Secondary | ICD-10-CM | POA: Diagnosis not present

## 2021-10-17 DIAGNOSIS — M6281 Muscle weakness (generalized): Secondary | ICD-10-CM | POA: Diagnosis not present

## 2021-10-17 DIAGNOSIS — M4624 Osteomyelitis of vertebra, thoracic region: Secondary | ICD-10-CM | POA: Diagnosis not present

## 2021-10-18 DIAGNOSIS — Z79899 Other long term (current) drug therapy: Secondary | ICD-10-CM | POA: Diagnosis not present

## 2021-10-18 DIAGNOSIS — I10 Essential (primary) hypertension: Secondary | ICD-10-CM | POA: Diagnosis not present

## 2021-10-18 DIAGNOSIS — E43 Unspecified severe protein-calorie malnutrition: Secondary | ICD-10-CM | POA: Diagnosis not present

## 2021-10-18 DIAGNOSIS — S32592S Other specified fracture of left pubis, sequela: Secondary | ICD-10-CM | POA: Diagnosis not present

## 2021-10-18 DIAGNOSIS — M6281 Muscle weakness (generalized): Secondary | ICD-10-CM | POA: Diagnosis not present

## 2021-10-19 DIAGNOSIS — E43 Unspecified severe protein-calorie malnutrition: Secondary | ICD-10-CM | POA: Diagnosis not present

## 2021-10-19 DIAGNOSIS — M4624 Osteomyelitis of vertebra, thoracic region: Secondary | ICD-10-CM | POA: Diagnosis not present

## 2021-10-19 DIAGNOSIS — K120 Recurrent oral aphthae: Secondary | ICD-10-CM | POA: Diagnosis not present

## 2021-10-19 DIAGNOSIS — B370 Candidal stomatitis: Secondary | ICD-10-CM | POA: Diagnosis not present

## 2021-10-21 ENCOUNTER — Ambulatory Visit (HOSPITAL_BASED_OUTPATIENT_CLINIC_OR_DEPARTMENT_OTHER): Payer: Medicare Other | Admitting: General Surgery

## 2021-10-25 DIAGNOSIS — Z79899 Other long term (current) drug therapy: Secondary | ICD-10-CM | POA: Diagnosis not present

## 2021-10-25 DIAGNOSIS — R52 Pain, unspecified: Secondary | ICD-10-CM | POA: Diagnosis not present

## 2021-10-25 DIAGNOSIS — N183 Chronic kidney disease, stage 3 unspecified: Secondary | ICD-10-CM | POA: Diagnosis not present

## 2021-11-04 ENCOUNTER — Ambulatory Visit (HOSPITAL_BASED_OUTPATIENT_CLINIC_OR_DEPARTMENT_OTHER): Payer: Medicare Other | Admitting: General Surgery

## 2021-11-09 DIAGNOSIS — M4624 Osteomyelitis of vertebra, thoracic region: Secondary | ICD-10-CM | POA: Diagnosis not present

## 2021-11-09 DIAGNOSIS — R770 Abnormality of albumin: Secondary | ICD-10-CM | POA: Diagnosis not present

## 2021-11-09 DIAGNOSIS — L89100 Pressure ulcer of unspecified part of back, unstageable: Secondary | ICD-10-CM | POA: Diagnosis not present

## 2021-11-09 DIAGNOSIS — E43 Unspecified severe protein-calorie malnutrition: Secondary | ICD-10-CM | POA: Diagnosis not present

## 2021-12-05 ENCOUNTER — Ambulatory Visit (HOSPITAL_BASED_OUTPATIENT_CLINIC_OR_DEPARTMENT_OTHER): Payer: Medicare Other | Admitting: Cardiovascular Disease

## 2021-12-06 DEATH — deceased

## 2022-02-03 ENCOUNTER — Ambulatory Visit: Payer: Medicare Other | Admitting: Family Medicine
